# Patient Record
Sex: Female | Born: 1940
Health system: Southern US, Community
[De-identification: ages and names within clinical notes are randomized; demographics above are authoritative.]

## PROBLEM LIST (undated history)

## (undated) DIAGNOSIS — K635 Polyp of colon: Secondary | ICD-10-CM

## (undated) DIAGNOSIS — R112 Nausea with vomiting, unspecified: Secondary | ICD-10-CM

## (undated) DIAGNOSIS — I10 Essential (primary) hypertension: Secondary | ICD-10-CM

## (undated) DIAGNOSIS — M1990 Unspecified osteoarthritis, unspecified site: Secondary | ICD-10-CM

## (undated) DIAGNOSIS — B351 Tinea unguium: Principal | ICD-10-CM

## (undated) DIAGNOSIS — N870 Mild cervical dysplasia: Secondary | ICD-10-CM

## (undated) DIAGNOSIS — I6529 Occlusion and stenosis of unspecified carotid artery: Secondary | ICD-10-CM

## (undated) DIAGNOSIS — E785 Hyperlipidemia, unspecified: Secondary | ICD-10-CM

## (undated) DIAGNOSIS — H9190 Unspecified hearing loss, unspecified ear: Secondary | ICD-10-CM

## (undated) DIAGNOSIS — J45909 Unspecified asthma, uncomplicated: Secondary | ICD-10-CM

## (undated) DIAGNOSIS — I350 Nonrheumatic aortic (valve) stenosis: Secondary | ICD-10-CM

## (undated) DIAGNOSIS — Z9889 Other specified postprocedural states: Secondary | ICD-10-CM

## (undated) DIAGNOSIS — B977 Papillomavirus as the cause of diseases classified elsewhere: Secondary | ICD-10-CM

## (undated) HISTORY — DX: Essential (primary) hypertension: I10

## (undated) HISTORY — DX: Unspecified asthma, uncomplicated: J45.909

## (undated) HISTORY — DX: Mild cervical dysplasia: N87.0

## (undated) HISTORY — DX: Occlusion and stenosis of unspecified carotid artery: I65.29

## (undated) HISTORY — DX: Papillomavirus as the cause of diseases classified elsewhere: B97.7

## (undated) HISTORY — PX: COLONOSCOPY: SHX174

## (undated) HISTORY — DX: Nonrheumatic aortic (valve) stenosis: I35.0

## (undated) HISTORY — PX: MULTIPLE TOOTH EXTRACTIONS: SHX2053

## (undated) HISTORY — PX: POLYPECTOMY: SHX149

## (undated) HISTORY — DX: Tinea unguium: B35.1

## (undated) HISTORY — DX: Unspecified hearing loss, unspecified ear: H91.90

## (undated) HISTORY — DX: Hyperlipidemia, unspecified: E78.5

## (undated) HISTORY — DX: Polyp of colon: K63.5

---

## 1989-08-21 HISTORY — PX: BREAST BIOPSY: SHX20

## 1998-07-27 ENCOUNTER — Encounter: Payer: Self-pay | Admitting: Internal Medicine

## 1998-07-27 ENCOUNTER — Ambulatory Visit (HOSPITAL_COMMUNITY): Admission: RE | Admit: 1998-07-27 | Discharge: 1998-07-27 | Payer: Self-pay | Admitting: Internal Medicine

## 1998-08-21 HISTORY — PX: DILATION AND CURETTAGE OF UTERUS: SHX78

## 1998-08-25 ENCOUNTER — Other Ambulatory Visit: Admission: RE | Admit: 1998-08-25 | Discharge: 1998-08-25 | Payer: Self-pay | Admitting: Internal Medicine

## 1998-09-01 ENCOUNTER — Encounter: Payer: Self-pay | Admitting: Internal Medicine

## 1998-09-01 ENCOUNTER — Ambulatory Visit (HOSPITAL_COMMUNITY): Admission: RE | Admit: 1998-09-01 | Discharge: 1998-09-01 | Payer: Self-pay | Admitting: Internal Medicine

## 1998-12-24 ENCOUNTER — Other Ambulatory Visit: Admission: RE | Admit: 1998-12-24 | Discharge: 1998-12-24 | Payer: Self-pay | Admitting: Obstetrics and Gynecology

## 1998-12-26 ENCOUNTER — Other Ambulatory Visit: Admission: RE | Admit: 1998-12-26 | Discharge: 1998-12-26 | Payer: Self-pay | Admitting: Obstetrics and Gynecology

## 1999-01-10 ENCOUNTER — Ambulatory Visit (HOSPITAL_COMMUNITY): Admission: RE | Admit: 1999-01-10 | Discharge: 1999-01-10 | Payer: Self-pay | Admitting: Obstetrics and Gynecology

## 1999-07-09 ENCOUNTER — Emergency Department (HOSPITAL_COMMUNITY): Admission: EM | Admit: 1999-07-09 | Discharge: 1999-07-09 | Payer: Self-pay | Admitting: Emergency Medicine

## 1999-07-10 ENCOUNTER — Encounter: Payer: Self-pay | Admitting: Emergency Medicine

## 1999-08-17 ENCOUNTER — Ambulatory Visit (HOSPITAL_COMMUNITY): Admission: RE | Admit: 1999-08-17 | Discharge: 1999-08-17 | Payer: Self-pay | Admitting: Internal Medicine

## 1999-08-17 ENCOUNTER — Encounter: Payer: Self-pay | Admitting: Internal Medicine

## 2000-05-26 ENCOUNTER — Emergency Department (HOSPITAL_COMMUNITY): Admission: EM | Admit: 2000-05-26 | Discharge: 2000-05-27 | Payer: Self-pay | Admitting: Emergency Medicine

## 2000-08-28 ENCOUNTER — Encounter: Payer: Self-pay | Admitting: Internal Medicine

## 2000-08-28 ENCOUNTER — Encounter: Admission: RE | Admit: 2000-08-28 | Discharge: 2000-08-28 | Payer: Self-pay | Admitting: Internal Medicine

## 2001-08-30 ENCOUNTER — Encounter: Payer: Self-pay | Admitting: Internal Medicine

## 2001-08-30 ENCOUNTER — Ambulatory Visit (HOSPITAL_COMMUNITY): Admission: RE | Admit: 2001-08-30 | Discharge: 2001-08-30 | Payer: Self-pay | Admitting: Internal Medicine

## 2001-09-27 ENCOUNTER — Other Ambulatory Visit: Admission: RE | Admit: 2001-09-27 | Discharge: 2001-09-27 | Payer: Self-pay | Admitting: Obstetrics and Gynecology

## 2002-09-24 ENCOUNTER — Ambulatory Visit (HOSPITAL_COMMUNITY): Admission: RE | Admit: 2002-09-24 | Discharge: 2002-09-24 | Payer: Self-pay | Admitting: Internal Medicine

## 2002-09-24 ENCOUNTER — Encounter: Payer: Self-pay | Admitting: Internal Medicine

## 2003-09-28 ENCOUNTER — Ambulatory Visit (HOSPITAL_COMMUNITY): Admission: RE | Admit: 2003-09-28 | Discharge: 2003-09-28 | Payer: Self-pay | Admitting: Internal Medicine

## 2004-09-30 ENCOUNTER — Ambulatory Visit (HOSPITAL_COMMUNITY): Admission: RE | Admit: 2004-09-30 | Discharge: 2004-09-30 | Payer: Self-pay | Admitting: Internal Medicine

## 2005-05-17 ENCOUNTER — Other Ambulatory Visit: Admission: RE | Admit: 2005-05-17 | Discharge: 2005-05-17 | Payer: Self-pay | Admitting: Internal Medicine

## 2005-09-28 ENCOUNTER — Ambulatory Visit (HOSPITAL_COMMUNITY): Admission: RE | Admit: 2005-09-28 | Discharge: 2005-09-28 | Payer: Self-pay | Admitting: Internal Medicine

## 2006-10-02 ENCOUNTER — Ambulatory Visit (HOSPITAL_COMMUNITY): Admission: RE | Admit: 2006-10-02 | Discharge: 2006-10-02 | Payer: Self-pay | Admitting: Internal Medicine

## 2006-10-30 ENCOUNTER — Other Ambulatory Visit: Admission: RE | Admit: 2006-10-30 | Discharge: 2006-10-30 | Payer: Self-pay | Admitting: Internal Medicine

## 2007-10-07 ENCOUNTER — Ambulatory Visit (HOSPITAL_COMMUNITY): Admission: RE | Admit: 2007-10-07 | Discharge: 2007-10-07 | Payer: Self-pay | Admitting: Internal Medicine

## 2008-02-16 ENCOUNTER — Emergency Department (HOSPITAL_COMMUNITY): Admission: EM | Admit: 2008-02-16 | Discharge: 2008-02-16 | Payer: Self-pay | Admitting: Emergency Medicine

## 2008-04-10 ENCOUNTER — Ambulatory Visit (HOSPITAL_COMMUNITY): Admission: RE | Admit: 2008-04-10 | Discharge: 2008-04-10 | Payer: Self-pay | Admitting: Obstetrics and Gynecology

## 2008-04-10 ENCOUNTER — Encounter (INDEPENDENT_AMBULATORY_CARE_PROVIDER_SITE_OTHER): Payer: Self-pay | Admitting: Obstetrics and Gynecology

## 2008-08-21 HISTORY — PX: KNEE SURGERY: SHX244

## 2008-08-21 HISTORY — PX: BREAST BIOPSY: SHX20

## 2008-08-31 ENCOUNTER — Inpatient Hospital Stay (HOSPITAL_COMMUNITY): Admission: EM | Admit: 2008-08-31 | Discharge: 2008-09-04 | Payer: Self-pay | Admitting: Emergency Medicine

## 2008-12-23 ENCOUNTER — Encounter: Payer: Self-pay | Admitting: Internal Medicine

## 2008-12-23 ENCOUNTER — Ambulatory Visit: Admission: RE | Admit: 2008-12-23 | Discharge: 2008-12-23 | Payer: Self-pay | Admitting: Internal Medicine

## 2008-12-23 ENCOUNTER — Ambulatory Visit: Payer: Self-pay | Admitting: Vascular Surgery

## 2010-09-10 ENCOUNTER — Encounter: Payer: Self-pay | Admitting: Internal Medicine

## 2010-12-05 LAB — BASIC METABOLIC PANEL
BUN: 12 mg/dL (ref 6–23)
BUN: 13 mg/dL (ref 6–23)
BUN: 13 mg/dL (ref 6–23)
CO2: 24 mEq/L (ref 19–32)
Calcium: 8.8 mg/dL (ref 8.4–10.5)
Calcium: 8.9 mg/dL (ref 8.4–10.5)
Chloride: 101 mEq/L (ref 96–112)
Chloride: 102 mEq/L (ref 96–112)
Chloride: 105 mEq/L (ref 96–112)
Creatinine, Ser: 0.97 mg/dL (ref 0.4–1.2)
Creatinine, Ser: 0.98 mg/dL (ref 0.4–1.2)
GFR calc Af Amer: >60 mL/min (ref >60)
GFR calc Af Amer: >60 mL/min (ref >60)
GFR calc Af Amer: >60 mL/min (ref >60)
GFR calc non Af Amer: 44 mL/min — ABNORMAL LOW (ref >60)
GFR calc non Af Amer: 57 mL/min — ABNORMAL LOW (ref >60)
GFR calc non Af Amer: 60 mL/min — ABNORMAL LOW (ref >60)
Glucose, Bld: 244 mg/dL — ABNORMAL HIGH (ref 70–99)
Potassium: 3.8 mEq/L (ref 3.5–5.1)
Potassium: 4.3 mEq/L (ref 3.5–5.1)
Sodium: 134 mEq/L — ABNORMAL LOW (ref 135–145)
Sodium: 135 mEq/L (ref 135–145)
Sodium: 140 mEq/L (ref 135–145)

## 2010-12-05 LAB — GLUCOSE, CAPILLARY
Glucose-Capillary: 103 mg/dL — ABNORMAL HIGH (ref 70–99)
Glucose-Capillary: 115 mg/dL — ABNORMAL HIGH (ref 70–99)
Glucose-Capillary: 119 mg/dL — ABNORMAL HIGH (ref 70–99)
Glucose-Capillary: 129 mg/dL — ABNORMAL HIGH (ref 70–99)
Glucose-Capillary: 139 mg/dL — ABNORMAL HIGH (ref 70–99)
Glucose-Capillary: 142 mg/dL — ABNORMAL HIGH (ref 70–99)
Glucose-Capillary: 150 mg/dL — ABNORMAL HIGH (ref 70–99)
Glucose-Capillary: 152 mg/dL — ABNORMAL HIGH (ref 70–99)
Glucose-Capillary: 176 mg/dL — ABNORMAL HIGH (ref 70–99)
Glucose-Capillary: 178 mg/dL — ABNORMAL HIGH (ref 70–99)
Glucose-Capillary: 183 mg/dL — ABNORMAL HIGH (ref 70–99)
Glucose-Capillary: 225 mg/dL — ABNORMAL HIGH (ref 70–99)
Glucose-Capillary: 232 mg/dL — ABNORMAL HIGH (ref 70–99)
Glucose-Capillary: 240 mg/dL — ABNORMAL HIGH (ref 70–99)
Glucose-Capillary: 241 mg/dL — ABNORMAL HIGH (ref 70–99)
Glucose-Capillary: 246 mg/dL — ABNORMAL HIGH (ref 70–99)
Glucose-Capillary: 60 mg/dL — ABNORMAL LOW (ref 70–99)
Glucose-Capillary: 86 mg/dL (ref 70–99)
Glucose-Capillary: 93 mg/dL (ref 70–99)

## 2010-12-05 LAB — URINALYSIS, ROUTINE W REFLEX MICROSCOPIC
Bilirubin Urine: NEGATIVE
Glucose, UA: NEGATIVE mg/dL
Hgb urine dipstick: NEGATIVE
Ketones, ur: NEGATIVE mg/dL
Nitrite: NEGATIVE
Protein, ur: NEGATIVE mg/dL
Specific Gravity, Urine: 1.009 (ref 1.005–1.030)
Urobilinogen, UA: 0.2 mg/dL (ref 0.0–1.0)
pH: 7 (ref 5.0–8.0)

## 2010-12-05 LAB — BASIC METABOLIC PANEL WITH GFR
CO2: 27 meq/L (ref 19–32)
Calcium: 9.1 mg/dL (ref 8.4–10.5)
Creatinine, Ser: 0.93 mg/dL (ref 0.4–1.2)
Glucose, Bld: 90 mg/dL (ref 70–99)

## 2010-12-05 LAB — POCT I-STAT 4, (NA,K, GLUC, HGB,HCT)
Glucose, Bld: 187 mg/dL — ABNORMAL HIGH (ref 70–99)
HCT: 35 % — ABNORMAL LOW (ref 36.0–46.0)
Hemoglobin: 11.9 g/dL — ABNORMAL LOW (ref 12.0–15.0)
Potassium: 4.7 mEq/L (ref 3.5–5.1)
Sodium: 134 meq/L — ABNORMAL LOW (ref 135–145)

## 2010-12-05 LAB — DIFFERENTIAL
Basophils Absolute: 0 K/uL (ref 0.0–0.1)
Basophils Relative: 0 % (ref 0–1)
Eosinophils Absolute: 0.1 10*3/uL (ref 0.0–0.7)
Eosinophils Relative: 1 % (ref 0–5)
Lymphocytes Relative: 14 % (ref 12–46)
Lymphs Abs: 1.2 10*3/uL (ref 0.7–4.0)
Monocytes Absolute: 0.4 10*3/uL (ref 0.1–1.0)
Monocytes Relative: 5 % (ref 3–12)
Neutro Abs: 7 K/uL (ref 1.7–7.7)
Neutrophils Relative %: 80 % — ABNORMAL HIGH (ref 43–77)

## 2010-12-05 LAB — CBC
HCT: 32.1 % — ABNORMAL LOW (ref 36.0–46.0)
HCT: 34.6 % — ABNORMAL LOW (ref 36.0–46.0)
Hemoglobin: 10.6 g/dL — ABNORMAL LOW (ref 12.0–15.0)
Hemoglobin: 10.7 g/dL — ABNORMAL LOW (ref 12.0–15.0)
Hemoglobin: 11.6 g/dL — ABNORMAL LOW (ref 12.0–15.0)
MCHC: 32.8 g/dL (ref 30.0–36.0)
MCHC: 33.6 g/dL (ref 30.0–36.0)
MCV: 84.4 fL (ref 78.0–100.0)
MCV: 86.2 fL (ref 78.0–100.0)
MCV: 86.2 fL (ref 78.0–100.0)
Platelets: 303 10*3/uL (ref 150–400)
Platelets: 333 10*3/uL (ref 150–400)
Platelets: 363 K/uL (ref 150–400)
RBC: 3.72 MIL/uL — ABNORMAL LOW (ref 3.87–5.11)
RBC: 3.83 MIL/uL — ABNORMAL LOW (ref 3.87–5.11)
RBC: 3.95 MIL/uL (ref 3.87–5.11)
RBC: 4.1 MIL/uL (ref 3.87–5.11)
RDW: 14 % (ref 11.5–15.5)
RDW: 14.2 % (ref 11.5–15.5)
WBC: 11.2 10*3/uL — ABNORMAL HIGH (ref 4.0–10.5)
WBC: 12.2 10*3/uL — ABNORMAL HIGH (ref 4.0–10.5)
WBC: 8.7 10*3/uL (ref 4.0–10.5)

## 2010-12-05 LAB — URINE MICROSCOPIC-ADD ON

## 2010-12-05 LAB — PROTIME-INR
INR: 1.1 (ref 0.00–1.49)
Prothrombin Time: 14.3 seconds (ref 11.6–15.2)

## 2011-01-03 NOTE — H&P (Signed)
Gail Lynch, HOPKINS               ACCOUNT NO.:  [id number]   MEDICAL RECORD NO.:  [id number]          PATIENT TYPE:  AMB   LOCATION:  SDC                           FACILITY:  [facility]   PHYSICIAN:  Janine Limbo, M.D.DATE OF BIRTH:  03/17/1941   DATE OF ADMISSION:  DATE OF DISCHARGE:                              HISTORY & PHYSICAL   DATE OF PROCEDURE:  April 10, 2008.   HISTORY OF PRESENT ILLNESS.:  Ms. Ivy is a 70 year old female, para 3-  0-0-3 who presents for a cold knife conization of the cervix.  She has  been followed at the [facility]  of [facility].  The patient had a Pap smear that  showed atypical squamous cells of undetermined significance.  Her high-  risk human papilloma virus test was positive.  Colposcopy and biopsies  were performed.  Biopsy showed cervical intraepithelial neoplasia I on  the exocervix but also on the endocervix.  The transition zone could not  be fully seen.  The patient had a dilatation and curettage performed in  2000 because of postmenopausal bleeding.  The pathology report was  negative.  Otherwise the patient has done well.   OBSTETRICAL HISTORY:  The patient has had 3 vaginal deliveries at term.   DRUG ALLERGIES:  THE PATIENT SAYS THAT SHE IS ALLERGIC TO ASPIRIN BUT  THEN SAYS SHE IS NOT REALLY SURE BUT ONCE TOOK ASPIRIN FOR A DENTAL  PROCEDURE AND JUST THOUGHT SHE SHOULD NOT TAKE ASPIRIN AGAIN.   PAST MEDICAL HISTORY:  The patient has hypertension and diabetes.   CURRENT MEDICATIONS:  Include NovoLog and she takes 40 units in the  morning and 40 units in the evening.  She takes Diovan hydrochloride.  She takes amlodipine.  She takes Janumet.  She was told to take a baby  aspirin each day but she does not take that medication.   SOCIAL HISTORY:  The patient denies cigarette use, alcohol use, and  recreational drug use.   FAMILY HISTORY:  The patient has a family history  of heart disease,  asthma, hypertension, and diabetes.   REVIEW OF SYSTEMS:  Noncontributory.   PHYSICAL EXAM:  Weight is 224 pounds.  Height is 5 feet 1 inch.  HEENT:  Within normal limits.  CHEST:  Clear.  HEART:  Regular rate and rhythm.  Her breasts are without masses.  Her abdomen is soft and nontender.  EXTREMITIES:  Grossly normal.  Her neurologic exam is grossly normal.  PELVIC EXAM:  External genitalia is normal.  The vagina is normal except  for atrophic changes.  The cervix is atrophic and flush with the vagina.  The uterus is small and no masses are appreciated.  Adnexa:  No masses  are appreciated.   ASSESSMENT:  1. Cervical intraepithelial neoplasia with a positive endocervical      curettage.  2. Transition zone could not be fully seen on colposcopy.  3. High-risk human papilloma virus.   PLAN:  The patient will undergo a cold knife conization of the cervix.  She understands the indications  for her procedure and she accepts the  risks of, but not limited to, anesthetic complications, bleeding,  infection, and possible damage to the surrounding organs.      Janine Limbo, M.D.  Electronically Signed     AVS/MEDQ  D:  04/09/2008  T:  04/09/2008  Job:  [id number]   cc:   Margaretmary Bayley, M.D.  Fax: [phone]

## 2011-01-03 NOTE — H&P (Signed)
NAMEBRUCHA, AHLQUIST               ACCOUNT NO.:  [id number]   MEDICAL RECORD NO.:  [id number]          PATIENT TYPE:  INP   LOCATION:  5018                         FACILITY:  [facility]   PHYSICIAN:  Gail Post, MD    DATE OF BIRTH:  12-Jul-1941   DATE OF ADMISSION:  08/31/2008  DATE OF DISCHARGE:                              HISTORY & PHYSICAL   CHIEF COMPLAINT:  Right knee pain.   HISTORY:  [name] is a 70 year old African American woman who  fell while walking onto her right knee today.  She complains of acute  onset left wrist and right knee pain.  She has had chronic left wrist  and hand pain, but she says that this is worse now than before.  The  right knee has excruciating pain and she is unable to walk.  She is  unable to lift her leg.  It is located directly over her kneecap.  She  does not have any other radicular symptoms.  She rates it as a 9/10 and  describes it as being sharp.   PAST MEDICAL HISTORY:  Significant for hypertension, diabetes, and  cervical abnormality.   FAMILY HISTORY:  Positive for diabetes and coronary artery disease in  her brother and her mother.  It is also positive for asthma.   SOCIAL HISTORY:  She quit smoking 20 years ago.   REVIEW OF SYSTEMS:  Complete review of systems was performed and was  otherwise negative with the exception of the musculoskeletal complaint  as above.   PHYSICAL EXAMINATION:  GENERAL:  She is alert and oriented, in no acute  distress, well developed, well nourished.  EYES:  Her extraocular movements are intact.  LYMPHATIC:  She has no cervical or axillary lymphadenopathy.  CARDIOVASCULAR:  She has a regular rate and rhythm with no pedal edema.  RESPIRATORY:  She has no cyanosis, no clubbing, and no increase in  respiratory efforts.  PSYCHIATRIC:  Her judgment and insight are intact.  SKIN:  She has no lesions over her knee.  She does have soft tissue  swelling.  NEUROLOGIC:  Her sensation is intact  throughout both lower extremities.  EHL and FHL are firing.  MUSCULOSKELETAL:  She has mild tenderness over the distal radius with no  gross deformity.  Her right knee has tenderness directly over the  patella.  Her motor is intact distally.  She cannot do a straight leg  raise due to pain.  She denies pain either over her hips.  Her left side  is atraumatic.   LABORATORY VALUES:  A chemistry 8 which is within normal limits and a  hemoglobin of 11.  She has an x-ray of her right knee that demonstrates  evidence for a comminuted patella fracture.   IMPRESSION:  1. Right comminuted patella fracture.  2. Diabetes mellitus.  3. Hypertension.   PLAN:  We will plan to admit her and prepare her for surgery.  She will  get an EKG and a chest x-ray and preoperative medical consultation in  order to optimize her risks.  We will plan to  proceed with surgery  hopefully tomorrow afternoon assuming that all is well.  The risks,  benefits, and alternatives were discussed with her and her family at  length and they would like to proceed.  We will obtain chest x-ray and  EKG and also an x-ray of her left wrist to rule out any additional  trauma.      Gail Post, MD  Electronically Signed     JPL/MEDQ  D:  08/31/2008  T:  09/01/2008  Job:  [id number]

## 2011-01-03 NOTE — Op Note (Signed)
Gail Lynch, Gail Lynch               ACCOUNT NO.:  [id number]   MEDICAL RECORD NO.:  [id number]          PATIENT TYPE:  AMB   LOCATION:  SDC                           [facility]   PHYSICIAN:  Janine Limbo, M.D.DATE OF BIRTH:  [birth year]   DATE OF PROCEDURE:  04/10/2008  DATE OF DISCHARGE:                               OPERATIVE REPORT   PREOPERATIVE DIAGNOSES:  1. Cervical intraepithelial neoplasia I.  2. Positive endocervical curettage.  3. Transition zone not completely seen.  4. Human papillomavirus.   POSTOPERATIVE DIAGNOSES:  1. Cervical intraepithelial neoplasia I.  2. Positive endocervical curettage.  3. Transition zone not completely seen.  4. Human papillomavirus.   PROCEDURE:  Cold knife conization of the cervix.   SURGEON:  Leonard Schwartz, MD   FIRST ASSISTANT:  None.   ANESTHETIC:  Monitored anesthetic control and local 0.5% Marcaine with  epinephrine and lidocaine.   DISPOSITION:  Gail Lynch is a 70 year old female, para 3-0-0-3, who  presents with the above-mentioned diagnosis.  She understands the  indications for her surgical procedure and she accepts the risks, but  not limited to, anesthetic complications, bleeding, infection, and  possible damage to surrounding organs.   FINDINGS:  The patient was noted to have a small uterus as best we could  tell by examination under anesthesia.  Upon staining of the vagina and  the cervix, there were no areas of nonstaining with Lugol solution.  The  cervix was flushed with the apex of the vagina and this made the  conization difficult.  There was no parametrial thickening and no signs  of cervical cancer.   PROCEDURE:  The patient was taken to the operating room where she was  given medication through her IV line.  The patient was placed in  lithotomy position.  The lower abdomen, perineum, and vagina were  prepped with multiple layers of Betadine.  The bladder was drained of  urine.  The patient  was sterilely draped.  2 mL of 0.5% Marcaine with  epinephrine and 1% Xylocaine were injected into the anterior lip of the  cervix.  A single-tooth tenaculum was placed on the cervix.  A  paracervical block was placed using 10 mL of our anesthetic solution.  The endocervix was sounded.  An additional 10 mL of our anesthetic  solution were injected directly into the cervix.  Next, a  circumferential incision was made around the endocervix and the incision  was angled in a cone like fashion toward the endocervix.  The specimen  was removed.  We noted an opening at the 2 o'clock position.  An  additional 5 mL of our anesthetic solution were injected directly into  the anterior lip of the cervix because of discomfort from the patient.  Inverting sutures were placed in the cervix at the 12 o'clock, 3  o'clock, 6 o'clock, and 9 o'clock positions.  Figure-of-eight sutures  were used for hemostasis.  Hemostasis was adequate at the end of our  procedure.  We did test the bladder to document the integrity of the  bladder by filling the bladder  up and there was no leakage into the  vagina.  The patient tolerated her procedure well.  The estimated blood  loss was 20 mL or less.  Sponge, needle, and instrument counts were  correct on 2 occasions.  Wound classification was 2.  The patient was  returned to the supine position after repeat examination.  She was then  taken to the recovery room in stable condition.  The conization specimen  was sent to pathology for evaluation.   FOLLOWUP INSTRUCTIONS:  The patient will take Tylenol 1-2 tablets every  6 hours as needed for mild-to-moderate pain.  She will take Vicodin 1-2  tablets every 4 hours as needed for severe pain.  She will take no more  than 6 tablets per day.  The patient will return to see Dr. Stefano Gaul in  2 weeks for followup examination.  She will call for questions or  concerns.      Janine Limbo, M.D.  Electronically  Signed     AVS/MEDQ  D:  04/10/2008  T:  04/11/2008  Job:  [id number]   cc:   Margaretmary Bayley, M.D.  Fax: [phone]

## 2011-01-03 NOTE — Op Note (Signed)
[name], Lynch               ACCOUNT NO.:  [id number]   MEDICAL RECORD NO.:  [id number]           PATIENT TYPE:   LOCATION:                                 FACILITY:   PHYSICIAN:  Eulas Post, MD    DATE OF BIRTH:  1941/03/22   DATE OF PROCEDURE:  09/01/2008  DATE OF DISCHARGE:                               OPERATIVE REPORT   PREOPERATIVE DIAGNOSES:  Right comminuted patellar fracture.   POSTOPERATIVE DIAGNOSES:  Right comminuted patellar fracture.   OPERATION/PROCEDURE:  Right patellar open reduction and internal  fixation with partial patellectomy.   ANESTHESIA:  General.   TOURNIQUET TIME:  90 minutes.   PREOPERATIVE INDICATIONS:  [name] is a 70 year old woman who  fell on to her right knee and broke her right patella.  She was admitted  and stabilized and optimized by the medical service.  She subsequently  went to the operating room for open reduction and internal fixation.  The risks, benefits, and alternatives were discussed with the patient  and the family preoperatively including not limited to the risks of  infection, bleeding, nerve injury, stiffness, posttraumatic arthritis,  loss of function, ongoing chronic pain, numbness in the anterolateral  knee, hardware permanent, hardware failure, the need for hardware  removal, cardiopulmonary complications, blood clots, and among others  and they were willing to proceed.   OPERATIVE PROCEDURE:  The patient was brought to the operating room,  placed in the supine position.  Antibiotics were given.  The right lower  extremity was prepped and draped in usual sterile fashion after general  anesthesia was administered.  The tourniquet was utilized.  A decision  was made over the anterior knee.  The patella was exposed.  There were  approximately 4 different pieces of the patella.  The one small  inferomedial piece was excised, because this was too small for fixation.  The distal pole was in basically 2  pieces and these were held each  individually with screws.  We also placed a  FiberWire #2, cerclage  through the cannulated screws.  At this time, we placed a total of 2  cerclage FiberWires in this fashion.  C-arm was utilized to confirm the  anatomical reduction.  The patella had fairly weak bone and had  significant comminution as well.  We were able to reduce it anatomically  under direct visualization.  The articular surface had less than a 1 mm  of step-off with the anterior surface anatomically reduced.  We placed  cannulated screws across the fracture site.  A 4.5 mm cannulated screws  were utilized.  We had excellent compression with the clamp and I was  fearful that overdrilling the bone would weaken the bone because of the  degree of comminution and bone quality, therefore, I used fully threaded  screws in order to get the best purchase.  Decompression was achieved  with the clamp.  I did open the distal pole of the patella with the  drill, in order to assist with placement of the screws.   After the screws were in place, and  the FiberWire passed.  We tied the  FiberWire down in a cerclage fashion and irrigated the knee copiously.  The retinaculum was repaired with #1 Vicryl.  The subcutaneous tissue  was closed  with 3-0 Vicryl followed by 4-0 Monocryl and Steri-Strips into the skin.  The wounds were injected with Marcaine and the patient was dressed with  sterile gauze and a knee mobilizer and returned to the PACU in a stable  and satisfactory condition.  There were no complications.  The patient  tolerated the procedure well.      Eulas Post, MD  Electronically Signed     JPL/MEDQ  D:  09/01/2008  T:  09/02/2008  Job:  [id number]

## 2011-01-03 NOTE — Discharge Summary (Signed)
[name], [name]               ACCOUNT NO.:  [id number]   MEDICAL RECORD NO.:  [id number]          PATIENT TYPE:  INP   LOCATION:  5018                         FACILITY:  [facility]   PHYSICIAN:  Eulas Post, MD    DATE OF BIRTH:  August 12, 1941   DATE OF ADMISSION:  08/31/2008  DATE OF DISCHARGE:  09/04/2008                               DISCHARGE SUMMARY   ADMISSION DIAGNOSIS:  Right patella fracture.   DISCHARGE DIAGNOSES:  Include:  1. Right patella fracture.  2. Hypertension.  3. Diabetes.   DISCHARGE INSTRUCTIONS:  She can be weightbearing as tolerated in full  extension using a knee immobilizer at all times.  Her dressings can be  changed every other day as needed.  Discharge medications include  Coumadin as well as Percocet.  Coumadin will be for 1 month.  She can  resume her home medications as well.   HOSPITAL COURSE:  [name] is a 70 year old woman who had a right  patella fracture.  She underwent ORIF.  She tolerated the procedure well  and postoperatively did not have any complications.  Her hemoglobin and  hematocrit remained stable throughout her hospital stay.  She did well  with physical therapy and was making slow progress.  She is planned to  be discharged to skilled nursing facility for ongoing physical therapy.  The patient benefited maximally from her hospital stay, and there were  no complications.      Eulas Post, MD  Electronically Signed     JPL/MEDQ  D:  09/04/2008  T:  09/04/2008  Job:  [id number]

## 2011-05-18 LAB — DIFFERENTIAL
Lymphs Abs: 1.1
Monocytes Absolute: 0.2
Monocytes Relative: 2 — ABNORMAL LOW
Neutro Abs: 7.8 — ABNORMAL HIGH
Neutrophils Relative %: 86 — ABNORMAL HIGH

## 2011-05-18 LAB — POCT CARDIAC MARKERS
CKMB, poc: 4.6
Myoglobin, poc: 190
Myoglobin, poc: 271
Operator id: 261601
Operator id: 261601
Troponin i, poc: <0.05
Troponin i, poc: <0.05

## 2011-05-18 LAB — COMPREHENSIVE METABOLIC PANEL
ALT: 17
Albumin: 3.4 — ABNORMAL LOW
BUN: 17
Calcium: 9.6
Glucose, Bld: 264 — ABNORMAL HIGH
Potassium: 4
Sodium: 137
Total Protein: 7.3

## 2011-05-18 LAB — CBC
Hemoglobin: 11.9 — ABNORMAL LOW
MCHC: 33.3
Platelets: 333
RDW: 14.4

## 2011-08-16 ENCOUNTER — Encounter: Payer: Self-pay | Admitting: Gastroenterology

## 2011-09-05 ENCOUNTER — Encounter: Payer: Self-pay | Admitting: Gastroenterology

## 2011-09-05 ENCOUNTER — Ambulatory Visit (INDEPENDENT_AMBULATORY_CARE_PROVIDER_SITE_OTHER): Payer: Medicare Other | Admitting: Gastroenterology

## 2011-09-05 DIAGNOSIS — E119 Type 2 diabetes mellitus without complications: Secondary | ICD-10-CM

## 2011-09-05 DIAGNOSIS — K5900 Constipation, unspecified: Secondary | ICD-10-CM | POA: Insufficient documentation

## 2011-09-05 MED ORDER — PEG-KCL-NACL-NASULF-NA ASC-C 100 G PO SOLR: 1.0000 | ORAL | Status: DC

## 2011-09-05 NOTE — Progress Notes (Signed)
HPI: This is a   very pleasant 71 year old woman whom I am meeting for the first time today.  She has irregular bowels for 2-3 months.  Before that she was regular: would have a BM easily 1-2 times a day.  But now she has a BM about 4 times a week.  NO blood in her stool.  The consistency of the stools have not changed.  Does not have blood in her stool  She had some brief band like abd pains, but now  She tried drinking more prune juice, more veggies in her diet.  She tried olive oil every day.  This did not help.  She has been taking flax seed for 2-3 months.  One a day.  This did not help.  She tried [place] a couple weeks ago, took pills one pill once daily for about 2 weeks.  She did not notice any changes in her bowels.  She tried [place] pills, once a day for three.  She thinks this caused wheezing ans SOB.  Overall her weight is stable.    No narcotics.   Stopped allopurinal   Never had a colonoscopy.  She did have a sigmoidoscopy (many years ago), she does not think anything was found.  She thinks that it was done by a gynecologist.  Review of systems: Pertinent positive and negative review of systems were noted in the above HPI section. Complete review of systems was performed and was otherwise normal.    Past Medical History  Diagnosis Date  . Diabetes mellitus   . Hypertension     Past Surgical History  Procedure Date  . Breast biopsy     Current Outpatient Prescriptions  Medication Sig Dispense Refill  . Cholecalciferol (VITAMIN D3) 1000 UNITS CAPS Take 1 capsule by mouth daily.      . fish oil-omega-3 fatty acids 1000 MG capsule Take 2 g by mouth daily.      . Flaxseed, Linseed, (FLAX SEED OIL) 1000 MG CAPS Take 3 capsules by mouth daily.      . Garlic 1000 MG CAPS Take 1 capsule by mouth daily.      . insulin aspart (NOVOLOG) 100 UNIT/ML injection Inject into the skin 3 (three) times daily before meals. Take 40 units in the morning and 35 at night       . pyridoxine (CVS VITAMIN B-6) 200 MG tablet Take 200 mg by mouth daily.      . valsartan-hydrochlorothiazide (DIOVAN-HCT) 160-12.5 MG per tablet Take 1 tablet by mouth daily.      . vitamin B-12 (CYANOCOBALAMIN) 500 MCG tablet Take 500 mcg by mouth daily. Take 1 to 3 tablets daily        Allergies as of 09/05/2011 - never reviewed  Allergen Reaction Noted  . Penicillins  09/05/2011    Family History  Problem Relation Age of Onset  . Diabetes Brother   . Heart disease Brother   . Heart disease Mother   . Heart disease Father     History   Social History  . Marital Status: Widowed    Spouse Name: N/A    Number of Children: 3  . Years of Education: N/A   Occupational History  . retired    Social History Main Topics  . Smoking status: Former Smoker    Quit date: 08/21/1990  . Smokeless tobacco: Former Neurosurgeon  . Alcohol Use: No  . Drug Use: No  . Sexually Active: Not on file  Other Topics Concern  . Not on file   Social History Narrative  . No narrative on file       Physical Exam: BP 130/70  Pulse 70  Ht 5\' 4"  (1.626 m)  Wt 220 lb 6.4 oz (99.973 kg)  BMI 37.83 kg/m2 Constitutional: generally well-appearing Psychiatric: alert and oriented x3 Eyes: extraocular movements intact Mouth: oral pharynx moist, no lesions Neck: supple no lymphadenopathy Cardiovascular: heart regular rate and rhythm Lungs: clear to auscultation bilaterally Abdomen: soft, nontender, nondistended, no obvious ascites, no peritoneal signs, normal bowel sounds Extremities: no lower extremity edema bilaterally Skin: no lesions on visible extremities    Assessment and plan: 71 y.o. female with  recent constipation, change in her bowels  She has never had a colonoscopy for colon cancer screening and we will arrange for that to be done at her soonest convenience to evaluate her constipation. In the meantime she will start once daily MiraLax.

## 2011-09-05 NOTE — Patient Instructions (Addendum)
You will be set up for a colonoscopy for constipation. Miralax, one dose every day, only stop if you start having terrible diarrhea.

## 2011-09-18 ENCOUNTER — Telehealth: Payer: Self-pay | Admitting: Gastroenterology

## 2011-09-18 DIAGNOSIS — G9389 Other specified disorders of brain: Secondary | ICD-10-CM

## 2011-09-18 NOTE — Telephone Encounter (Signed)
Pt is calling to give [place] some information about her medical condition.  Had knee surgery in 2010.  MRI in 2005 was told she has a mass on her brain.  Wants this added to her medical record. I  have added this info for her.

## 2011-10-02 ENCOUNTER — Telehealth: Payer: Self-pay | Admitting: Gastroenterology

## 2011-10-02 MED ORDER — PEG-KCL-NACL-NASULF-NA ASC-C 100 G PO SOLR: 1.0000 | ORAL | Status: DC

## 2011-10-02 NOTE — Telephone Encounter (Signed)
Pt prep was sent to the wrong pharmacy and was resent today to the correct [facility].  Pt is aware

## 2011-10-03 ENCOUNTER — Encounter: Payer: Self-pay | Admitting: Gastroenterology

## 2011-10-03 ENCOUNTER — Ambulatory Visit (AMBULATORY_SURGERY_CENTER): Payer: Medicare Other | Admitting: Gastroenterology

## 2011-10-03 DIAGNOSIS — D126 Benign neoplasm of colon, unspecified: Secondary | ICD-10-CM

## 2011-10-03 DIAGNOSIS — K5900 Constipation, unspecified: Secondary | ICD-10-CM

## 2011-10-03 DIAGNOSIS — K648 Other hemorrhoids: Secondary | ICD-10-CM

## 2011-10-03 MED ORDER — SODIUM CHLORIDE 0.9 % IV SOLN: 500.0000 mL | INTRAVENOUS | Status: DC

## 2011-10-03 NOTE — Patient Instructions (Signed)
Resume medications. Information given on polyps,hemorrhoids and high fiber diet. D/C Instructions reviewed with family.

## 2011-10-03 NOTE — Op Note (Signed)
[facility] [address] [place], [place]  [address]  COLONOSCOPY PROCEDURE REPORT  PATIENT:  Gail Lynch, Gail Lynch  MR#:  [id number] BIRTHDATE:  [birth year], 70 yrs. old  GENDER:  female ENDOSCOPIST:  Rachael Fee, MD REF. BY:  Margaretmary Bayley, M.D. PROCEDURE DATE:  10/03/2011 PROCEDURE:  Colonoscopy with snare polypectomy ASA CLASS:  Class II INDICATIONS:  constipation MEDICATIONS:   Fentanyl 75 mcg IV, These medications were titrated to patient response per physician's verbal order, Versed 5 mg IV DESCRIPTION OF PROCEDURE:   After the risks benefits and alternatives of the procedure were thoroughly explained, informed consent was obtained.  Digital rectal exam was performed and revealed no rectal masses.   The LB CF-[id number] endoscope was introduced through the anus and advanced to the cecum, which was identified by both the appendix and ileocecal valve, without limitations.  The quality of the prep was good..  The instrument was then slowly withdrawn as the colon was fully examined. <<PROCEDUREIMAGES>> FINDINGS: Three polyps were found, all were removed and all were sent to pathology (jar 1). The largest was 8mm across, heaped up, located in ascending colon, removed with cold snare and sent to pathology. The others were sessile, 3-83mm across, located in cecum and ascending colon, removed with cold snare, sent to pathology (see image3, image7, and image8).  Internal and external Hemorrhoids were found.  This was otherwise a normal examination of the colon (see image10, image4, and image2).   Retroflexed views in the rectum revealed no abnormalities. COMPLICATIONS:  None  ENDOSCOPIC IMPRESSION: 1) Three polyps, all were removed and all were sent to pathology  2) Internal and external hemorrhoids 3) Otherwise normal examination  RECOMMENDATIONS: 1) If the polyps removed today are proven to be adenomatous (pre-cancerous) polyps, you will need a colonoscopy in 3  years. Otherwise you should continue to follow colorectal cancer screening guidelines for "routine risk" patients with a colonoscopy in 10 years. 2) You will receive a letter within 1-2 weeks with the results of your biopsy as well as final recommendations. Please call my office if you have not received a letter after 3 weeks. 3) You should stay on miralax, one dose every day for your chronic constipation. 4) Dr. Christella Hartigan' office will help arrange for you to meet with a new PCP, per your request.  ______________________________ Rachael Fee, MD  n. eSIGNED:   Rachael Fee at 10/03/2011 02:00 PM  Hadassah Pais, [phone]

## 2011-10-03 NOTE — Progress Notes (Signed)
Patient did not experience any of the following events: a burn prior to discharge; a fall within the facility; wrong site/side/patient/procedure/implant event; or a hospital transfer or hospital admission upon discharge from the facility. (G8907) Patient did not have preoperative order for IV antibiotic SSI prophylaxis. (G8918)  

## 2011-10-04 ENCOUNTER — Telehealth: Payer: Self-pay | Admitting: *Deleted

## 2011-10-04 NOTE — Telephone Encounter (Signed)
  Follow up Call-  Call back number 10/03/2011  Post procedure Call Back phone  # [phone]  Permission to leave phone message No  comments PT SAYS SHE DOES NOT HAVE ANS MACHINE     Patient questions:  Do you have a fever, pain , or abdominal swelling? no Pain Score  0 *  Have you tolerated food without any problems? yes  Have you been able to return to your normal activities? yes  Do you have any questions about your discharge instructions: Diet   no Medications  no Follow up visit  no  Do you have questions or concerns about your Care? no  Actions: * If pain score is 4 or above: No action needed, pain <4.

## 2011-10-10 ENCOUNTER — Encounter: Payer: Self-pay | Admitting: Gastroenterology

## 2011-12-17 ENCOUNTER — Encounter: Payer: Self-pay | Admitting: Internal Medicine

## 2011-12-17 DIAGNOSIS — I10 Essential (primary) hypertension: Secondary | ICD-10-CM | POA: Insufficient documentation

## 2011-12-17 DIAGNOSIS — B977 Papillomavirus as the cause of diseases classified elsewhere: Secondary | ICD-10-CM | POA: Insufficient documentation

## 2011-12-17 DIAGNOSIS — H9190 Unspecified hearing loss, unspecified ear: Secondary | ICD-10-CM | POA: Insufficient documentation

## 2011-12-17 DIAGNOSIS — Z0000 Encounter for general adult medical examination without abnormal findings: Secondary | ICD-10-CM | POA: Insufficient documentation

## 2011-12-17 DIAGNOSIS — N870 Mild cervical dysplasia: Secondary | ICD-10-CM | POA: Insufficient documentation

## 2011-12-18 ENCOUNTER — Other Ambulatory Visit (INDEPENDENT_AMBULATORY_CARE_PROVIDER_SITE_OTHER): Payer: Medicare Other

## 2011-12-18 ENCOUNTER — Ambulatory Visit (INDEPENDENT_AMBULATORY_CARE_PROVIDER_SITE_OTHER): Payer: Medicare Other | Admitting: Internal Medicine

## 2011-12-18 ENCOUNTER — Encounter: Payer: Self-pay | Admitting: Internal Medicine

## 2011-12-18 VITALS — BP 138/62 | HR 71 | Temp 98.9°F | Ht 63.0 in | Wt 218.5 lb

## 2011-12-18 DIAGNOSIS — E785 Hyperlipidemia, unspecified: Secondary | ICD-10-CM | POA: Insufficient documentation

## 2011-12-18 DIAGNOSIS — Z23 Encounter for immunization: Secondary | ICD-10-CM

## 2011-12-18 DIAGNOSIS — E119 Type 2 diabetes mellitus without complications: Secondary | ICD-10-CM

## 2011-12-18 DIAGNOSIS — Z0000 Encounter for general adult medical examination without abnormal findings: Secondary | ICD-10-CM

## 2011-12-18 DIAGNOSIS — J45909 Unspecified asthma, uncomplicated: Secondary | ICD-10-CM

## 2011-12-18 DIAGNOSIS — K635 Polyp of colon: Secondary | ICD-10-CM | POA: Insufficient documentation

## 2011-12-18 DIAGNOSIS — H409 Unspecified glaucoma: Secondary | ICD-10-CM | POA: Insufficient documentation

## 2011-12-18 HISTORY — DX: Unspecified asthma, uncomplicated: J45.909

## 2011-12-18 LAB — HEPATIC FUNCTION PANEL
ALT: 22 U/L (ref 0–35)
AST: 18 U/L (ref 0–37)
Alkaline Phosphatase: 84 U/L (ref 39–117)
Bilirubin, Direct: 0 mg/dL (ref 0.0–0.3)
Total Protein: 7.1 g/dL (ref 6.0–8.3)

## 2011-12-18 LAB — URINALYSIS, ROUTINE W REFLEX MICROSCOPIC
Hgb urine dipstick: NEGATIVE
Ketones, ur: NEGATIVE
Leukocytes, UA: NEGATIVE
Specific Gravity, Urine: 1.01 (ref 1.000–1.030)
Urobilinogen, UA: 0.2 (ref 0.0–1.0)

## 2011-12-18 LAB — CBC WITH DIFFERENTIAL/PLATELET
Basophils Absolute: 0 10*3/uL (ref 0.0–0.1)
Eosinophils Absolute: 0.2 10*3/uL (ref 0.0–0.7)
Lymphocytes Relative: 29.3 % (ref 12.0–46.0)
MCHC: 33 g/dL (ref 30.0–36.0)
Neutrophils Relative %: 59.8 % (ref 43.0–77.0)
RDW: 13.9 % (ref 11.5–14.6)

## 2011-12-18 LAB — BASIC METABOLIC PANEL
CO2: 30 mEq/L (ref 19–32)
Chloride: 103 mEq/L (ref 96–112)
Sodium: 142 mEq/L (ref 135–145)

## 2011-12-18 LAB — MICROALBUMIN / CREATININE URINE RATIO
Creatinine,U: 76.6 mg/dL
Microalb Creat Ratio: 1.6 mg/g (ref 0.0–30.0)

## 2011-12-18 LAB — LIPID PANEL
Cholesterol: 216 mg/dL — ABNORMAL HIGH (ref 0–200)
Total CHOL/HDL Ratio: 5

## 2011-12-18 LAB — LDL CHOLESTEROL, DIRECT: Direct LDL: 152.9 mg/dL

## 2011-12-18 MED ORDER — INSULIN ASPART 100 UNIT/ML ~~LOC~~ SOLN: SUBCUTANEOUS | Status: DC

## 2011-12-18 MED ORDER — VALSARTAN-HYDROCHLOROTHIAZIDE 160-12.5 MG PO TABS: 1.0000 | ORAL_TABLET | Freq: Every day | ORAL | Status: DC

## 2011-12-18 NOTE — Progress Notes (Signed)
Subjective:    Patient ID: Gail Lynch, female    DOB: October 02, 1940, 71 y.o.   MRN: [id number]  HPI  Here for wellness and f/u;  Overall doing ok;  Pt denies CP, worsening SOB, DOE, wheezing, orthopnea, PND, worsening LE edema, palpitations, dizziness or syncope.  Pt denies neurological change such as new Headache, facial or extremity weakness.  Pt denies polydipsia, polyuria, or low sugar symptoms. Pt states overall good compliance with treatment and medications, good tolerability, and trying to follow lower cholesterol diet.  Pt denies worsening depressive symptoms, suicidal ideation or panic. No fever, wt loss, night sweats, loss of appetite, or other constitutional symptoms.  Pt states good ability with ADL's, low fall risk, home safety reviewed and adequate, no significant changes in hearing or vision, and occasionally active with exercise.  No acute complaints.  No fever or recent falls.  Due for shots today.  Needs refills as well Past Medical History  Diagnosis Date  . Diabetes mellitus   . Hypertension   . Brain mass 2005  . Hard of hearing   . Dysplasia of cervix, low grade (CIN 1)     s/p cervical conization  . HPV (human papilloma virus) infection   . Asthma   . Glaucoma   . Hyperlipidemia   . Colon polyps   . Intrinsic asthma, unspecified 12/18/2011  . Glaucoma 12/18/2011   Past Surgical History  Procedure Date  . Breast biopsy   . Knee surgery 2010    patella fracture  . Breast biopsy 1991    reports that she quit smoking about 21 years ago. She has quit using smokeless tobacco. She reports that she does not drink alcohol or use illicit drugs. family history includes Asthma in an unspecified family member; Diabetes in her brother; and Heart disease in her brother, father, and mother. Allergies  Allergen Reactions  . Aspirin Other (See Comments)    BLEEDING BEHIND EYES  . Penicillins    Current Outpatient Prescriptions on File Prior to Visit  Medication Sig Dispense  Refill  . Cholecalciferol (VITAMIN D3) 1000 UNITS CAPS Take 1 capsule by mouth daily.      . fish oil-omega-3 fatty acids 1000 MG capsule Take 2 g by mouth daily.      . Flaxseed, Linseed, (FLAX SEED OIL) 1000 MG CAPS Take 3 capsules by mouth daily.      . Garlic 1000 MG CAPS Take 1 capsule by mouth daily.      . insulin aspart (NOVOLOG) 100 UNIT/ML injection Inject into the skin 3 (three) times daily before meals. Take 40 units in the morning and 35 at night      . pyridoxine ([employer] VITAMIN B-6) 200 MG tablet Take 200 mg by mouth daily.      . valsartan-hydrochlorothiazide (DIOVAN-HCT) 160-12.5 MG per tablet Take 1 tablet by mouth daily.      . vitamin B-12 (CYANOCOBALAMIN) 500 MCG tablet Take 500 mcg by mouth daily. Take 1 to 3 tablets daily       Review of Systems Review of Systems  Constitutional: Negative for diaphoresis, activity change, appetite change and unexpected weight change.  HENT: Negative for hearing loss, ear pain, facial swelling, mouth sores and neck stiffness.   Eyes: Negative for pain, redness and visual disturbance.  Respiratory: Negative for shortness of breath and wheezing.   Cardiovascular: Negative for chest pain and palpitations.  Gastrointestinal: Negative for diarrhea, blood in stool, abdominal distention and rectal pain.  Genitourinary: Negative for  hematuria, flank pain and decreased urine volume.  Musculoskeletal: Negative for myalgias and joint swelling.  Skin: Negative for color change and wound.  Neurological: Negative for syncope and numbness.  Hematological: Negative for adenopathy.  Psychiatric/Behavioral: Negative for hallucinations, self-injury, decreased concentration and agitation.      Objective:   Physical Exam BP 138/62  Pulse 71  Temp(Src) 98.9 F (37.2 C) (Oral)  Ht 5\' 3"  (1.6 m)  Wt 218 lb 8 oz (99.111 kg)  BMI 38.71 kg/m2  SpO2 98% Physical Exam  VS noted, obese Constitutional: Pt is oriented to person, place, and time. Appears  well-developed and well-nourished.  HENT:  Head: Normocephalic and atraumatic.  Right Ear: External ear normal.  Left Ear: External ear normal.  Nose: Nose normal.  Mouth/Throat: Oropharynx is clear and moist.  Eyes: Conjunctivae and EOM are normal. Pupils are equal, round, and reactive to light.  Neck: Normal range of motion. Neck supple. No JVD present. No tracheal deviation present.  Cardiovascular: Normal rate, regular rhythm, normal heart sounds and intact distal pulses.   Pulmonary/Chest: Effort normal and breath sounds normal.  Abdominal: Soft. Bowel sounds are normal. There is no tenderness.  Musculoskeletal: Normal range of motion. Exhibits no edema.  Lymphadenopathy:  Has no cervical adenopathy.  Neurological: Pt is alert and oriented to person, place, and time. Pt has normal reflexes. No cranial nerve deficit. Motor/gait intact Skin: Skin is warm and dry. No rash noted.  Psychiatric:  Has  normal mood and affect. Behavior is normal.     Assessment & Plan:

## 2011-12-18 NOTE — Assessment & Plan Note (Signed)
Overall doing well, age appropriate education and counseling updated, referrals for preventative services and immunizations addressed, dietary and smoking counseling addressed, most recent labs and ECG reviewed.  I have personally reviewed and have noted: 1) the patient's medical and social history 2) The pt's use of alcohol, tobacco, and illicit drugs 3) The patient's current medications and supplements 4) Functional ability including ADL's, fall risk, home safety risk, hearing and visual impairment 5) Diet and physical activities 6) Evidence for depression or mood disorder 7) The patient's height, weight, and BMI have been recorded in the chart I have made referrals, and provided counseling and education based on review of the above For tetanus and pneumovax, routine labs today, consider shingles shot

## 2011-12-18 NOTE — Assessment & Plan Note (Signed)
stable overall by hx and exam, most recent data reviewed with pt, and pt to continue medical treatment as before; ; for labs today

## 2011-12-18 NOTE — Progress Notes (Signed)
Addended by: Scharlene Gloss B on: 12/18/2011 02:33 PM   Modules accepted: Orders

## 2011-12-18 NOTE — Patient Instructions (Addendum)
Please consider calling your insurance to see if the shingles shot is covered; if so, make Nurse Appt for the shot Please remember to followup with your GYN for the yearly pap smear and/or mammogram as you normally do You had the tetanus and pneumonia shots today Please go to LAB in the Basement for the blood and/or urine tests to be done today You will be contacted by phone if any changes need to be made immediately.  Otherwise, you will receive a letter about your results with an explanation. Your refills were done as requested Please return in 6 mo with Lab testing done 3-5 days before

## 2011-12-19 ENCOUNTER — Encounter: Payer: Self-pay | Admitting: Internal Medicine

## 2011-12-19 ENCOUNTER — Telehealth: Payer: Self-pay | Admitting: Internal Medicine

## 2011-12-19 MED ORDER — ATORVASTATIN CALCIUM 20 MG PO TABS: 20.0000 mg | ORAL_TABLET | Freq: Every day | ORAL | Status: DC

## 2011-12-19 MED ORDER — INSULIN ASPART 100 UNIT/ML ~~LOC~~ SOLN: SUBCUTANEOUS | Status: DC

## 2011-12-19 NOTE — Telephone Encounter (Signed)
Patient informed of increase of insulin and to start Lipitor.

## 2011-12-19 NOTE — Telephone Encounter (Signed)
Message copied by Corwin Levins on Tue Dec 19, 2011 12:44 PM ------      Message from: Scharlene Gloss B      Created: Tue Dec 19, 2011 11:59 AM       Called the patient and she takes 40 units in the AM and 35 units in the PM

## 2011-12-19 NOTE — Telephone Encounter (Signed)
Labs ok except a1c high at just over 9, and LDL too high as well (needs to be less than 70)  To increase the 70/30 to 50 in the AM, 45 units in the PM  To start lipitor 20 qd  F/u labs next visit  Robin to inform pt, I will do rx's

## 2011-12-21 ENCOUNTER — Other Ambulatory Visit: Payer: Self-pay | Admitting: Internal Medicine

## 2011-12-21 DIAGNOSIS — Z1231 Encounter for screening mammogram for malignant neoplasm of breast: Secondary | ICD-10-CM

## 2012-01-08 ENCOUNTER — Telehealth: Payer: Self-pay

## 2012-01-08 NOTE — Telephone Encounter (Signed)
Pt called stating that her CBGs are still elevated 200+. Pt has increased her units at bedtime to 45. Pt says that she has previously used Novolog 70/30 that controlled her CBGs better. Please advise for this Dr Jonny Ruiz patient, thanks!

## 2012-01-08 NOTE — Telephone Encounter (Signed)
Increase novolog to 60 units 2x a day (just before the first and last meals of the day)

## 2012-01-08 NOTE — Telephone Encounter (Signed)
Pt informed of MD's advisement. 

## 2012-01-17 ENCOUNTER — Telehealth: Payer: Self-pay | Admitting: Internal Medicine

## 2012-01-17 NOTE — Telephone Encounter (Signed)
I am pretty sure her DM is managed per Dr Willow Ora;  Please ask pt to call Dr Chestine Spore if she is seeing him for DM

## 2012-01-17 NOTE — Telephone Encounter (Signed)
Caller: Gail Lynch/Patient; PCP: Oliver Barre; CB#: [phone];  Call regarding Would Like Her Insulin Rx Changed; Pt's insulin was changed to Novalog Insulin recently. Taking 40 U in AM, 35 U in PM. She has increased her insulin to 50U AM, 45U PM as her BS has been running high. BS's are still in the 200's. She ? if she can go back on Novalog 70/30 as this controlled her BS. Diabetes: Control Problems. PLS CALL.

## 2012-01-17 NOTE — Telephone Encounter (Signed)
Called the patient and she is no longer seeing Dr. Chestine Spore.  She states she does not see an Endo. MD.

## 2012-01-17 NOTE — Telephone Encounter (Signed)
Since her case is more complicated, I would like to have her see Dr Glenna Fellows here;  Let me know if that is ok please

## 2012-01-18 ENCOUNTER — Ambulatory Visit (HOSPITAL_COMMUNITY)
Admission: RE | Admit: 2012-01-18 | Discharge: 2012-01-18 | Disposition: A | Discharge status: Home / Self Care | Payer: Medicare Other | Source: Ambulatory Visit | Attending: Internal Medicine | Admitting: Internal Medicine

## 2012-01-18 DIAGNOSIS — Z1231 Encounter for screening mammogram for malignant neoplasm of breast: Secondary | ICD-10-CM

## 2012-01-18 NOTE — Telephone Encounter (Signed)
Called the patient could not leave a message and no answer

## 2012-01-18 NOTE — Telephone Encounter (Signed)
Called the patient and she is ok to see Dr. Everardo All.

## 2012-01-18 NOTE — Telephone Encounter (Signed)
Done per emr 

## 2012-01-26 ENCOUNTER — Encounter: Payer: Self-pay | Admitting: Obstetrics and Gynecology

## 2012-01-26 ENCOUNTER — Other Ambulatory Visit (HOSPITAL_COMMUNITY)
Admission: RE | Admit: 2012-01-26 | Discharge: 2012-01-26 | Disposition: A | Discharge status: Home / Self Care | Payer: Medicare Other | Source: Ambulatory Visit | Attending: Obstetrics and Gynecology | Admitting: Obstetrics and Gynecology

## 2012-01-26 ENCOUNTER — Ambulatory Visit (INDEPENDENT_AMBULATORY_CARE_PROVIDER_SITE_OTHER): Payer: Medicare Other | Admitting: Obstetrics and Gynecology

## 2012-01-26 VITALS — BP 128/63 | HR 80 | Temp 97.9°F | Ht 62.0 in | Wt 219.6 lb

## 2012-01-26 DIAGNOSIS — Z01419 Encounter for gynecological examination (general) (routine) without abnormal findings: Secondary | ICD-10-CM

## 2012-01-26 DIAGNOSIS — Z1159 Encounter for screening for other viral diseases: Secondary | ICD-10-CM | POA: Insufficient documentation

## 2012-01-26 DIAGNOSIS — Z124 Encounter for screening for malignant neoplasm of cervix: Secondary | ICD-10-CM | POA: Insufficient documentation

## 2012-01-26 NOTE — Patient Instructions (Signed)
Preventive Care for Adults, Female A healthy lifestyle and preventive care can promote health and wellness. Preventive health guidelines for women include the following key practices.  A routine yearly physical is a good way to check with your caregiver about your health and preventive screening. It is a chance to share any concerns and updates on your health, and to receive a thorough exam.   Visit your dentist for a routine exam and preventive care every 6 months. Brush your teeth twice a day and floss once a day. Good oral hygiene prevents tooth decay and gum disease.   The frequency of eye exams is based on your age, health, family medical history, use of contact lenses, and other factors. Follow your caregiver's recommendations for frequency of eye exams.   Eat a healthy diet. Foods like vegetables, fruits, whole grains, low-fat dairy products, and lean protein foods contain the nutrients you need without too many calories. Decrease your intake of foods high in solid fats, added sugars, and salt. Eat the right amount of calories for you.Get information about a proper diet from your caregiver, if necessary.   Regular physical exercise is one of the most important things you can do for your health. Most adults should get at least 150 minutes of moderate-intensity exercise (any activity that increases your heart rate and causes you to sweat) each week. In addition, most adults need muscle-strengthening exercises on 2 or more days a week.   Maintain a healthy weight. The body mass index (BMI) is a screening tool to identify possible weight problems. It provides an estimate of body fat based on height and weight. Your caregiver can help determine your BMI, and can help you achieve or maintain a healthy weight.For adults 20 years and older:   A BMI below 18.5 is considered underweight.   A BMI of 18.5 to 24.9 is normal.   A BMI of 25 to 29.9 is considered overweight.   A BMI of 30 and above  is considered obese.   Maintain normal blood lipids and cholesterol levels by exercising and minimizing your intake of saturated fat. Eat a balanced diet with plenty of fruit and vegetables. Blood tests for lipids and cholesterol should begin at age 20 and be repeated every 5 years. If your lipid or cholesterol levels are high, you are over 50, or you are at high risk for heart disease, you may need your cholesterol levels checked more frequently.Ongoing high lipid and cholesterol levels should be treated with medicines if diet and exercise are not effective.   If you smoke, find out from your caregiver how to quit. If you do not use tobacco, do not start.   If you are pregnant, do not drink alcohol. If you are breastfeeding, be very cautious about drinking alcohol. If you are not pregnant and choose to drink alcohol, do not exceed 1 drink per day. One drink is considered to be 12 ounces (355 mL) of beer, 5 ounces (148 mL) of wine, or 1.5 ounces (44 mL) of liquor.   Avoid use of street drugs. Do not share needles with anyone. Ask for help if you need support or instructions about stopping the use of drugs.   High blood pressure causes heart disease and increases the risk of stroke. Your blood pressure should be checked at least every 1 to 2 years. Ongoing high blood pressure should be treated with medicines if weight loss and exercise are not effective.   If you are 55 to 73   years old, ask your caregiver if you should take aspirin to prevent strokes.   Diabetes screening involves taking a blood sample to check your fasting blood sugar level. This should be done once every 3 years, after age 45, if you are within normal weight and without risk factors for diabetes. Testing should be considered at a younger age or be carried out more frequently if you are overweight and have at least 1 risk factor for diabetes.   Breast cancer screening is essential preventive care for women. You should practice  "breast self-awareness." This means understanding the normal appearance and feel of your breasts and may include breast self-examination. Any changes detected, no matter how small, should be reported to a caregiver. Women in their 20s and 30s should have a clinical breast exam (CBE) by a caregiver as part of a regular health exam every 1 to 3 years. After age 40, women should have a CBE every year. Starting at age 40, women should consider having a mammography (breast X-ray test) every year. Women who have a family history of breast cancer should talk to their caregiver about genetic screening. Women at a high risk of breast cancer should talk to their caregivers about having magnetic resonance imaging (MRI) and a mammography every year.   The Pap test is a screening test for cervical cancer. A Pap test can show cell changes on the cervix that might become cervical cancer if left untreated. A Pap test is a procedure in which cells are obtained and examined from the lower end of the uterus (cervix).   Women should have a Pap test starting at age 21.   Between ages 21 and 29, Pap tests should be repeated every 2 years.   Beginning at age 30, you should have a Pap test every 3 years as long as the past 3 Pap tests have been normal.   Some women have medical problems that increase the chance of getting cervical cancer. Talk to your caregiver about these problems. It is especially important to talk to your caregiver if a new problem develops soon after your last Pap test. In these cases, your caregiver may recommend more frequent screening and Pap tests.   The above recommendations are the same for women who have or have not gotten the vaccine for human papillomavirus (HPV).   If you had a hysterectomy for a problem that was not cancer or a condition that could lead to cancer, then you no longer need Pap tests. Even if you no longer need a Pap test, a regular exam is a good idea to make sure no other  problems are starting.   If you are between ages 65 and 70, and you have had normal Pap tests going back 10 years, you no longer need Pap tests. Even if you no longer need a Pap test, a regular exam is a good idea to make sure no other problems are starting.   If you have had past treatment for cervical cancer or a condition that could lead to cancer, you need Pap tests and screening for cancer for at least 20 years after your treatment.   If Pap tests have been discontinued, risk factors (such as a new sexual partner) need to be reassessed to determine if screening should be resumed.   The HPV test is an additional test that may be used for cervical cancer screening. The HPV test looks for the virus that can cause the cell changes on the cervix.   The cells collected during the Pap test can be tested for HPV. The HPV test could be used to screen women aged 30 years and older, and should be used in women of any age who have unclear Pap test results. After the age of 30, women should have HPV testing at the same frequency as a Pap test.   Colorectal cancer can be detected and often prevented. Most routine colorectal cancer screening begins at the age of 50 and continues through age 75. However, your caregiver may recommend screening at an earlier age if you have risk factors for colon cancer. On a yearly basis, your caregiver may provide home test kits to check for hidden blood in the stool. Use of a small camera at the end of a tube, to directly examine the colon (sigmoidoscopy or colonoscopy), can detect the earliest forms of colorectal cancer. Talk to your caregiver about this at age 50, when routine screening begins. Direct examination of the colon should be repeated every 5 to 10 years through age 75, unless early forms of pre-cancerous polyps or small growths are found.   Hepatitis C blood testing is recommended for all people born from 1945 through 1965 and any individual with known risks for  hepatitis C.   Practice safe sex. Use condoms and avoid high-risk sexual practices to reduce the spread of sexually transmitted infections (STIs). STIs include gonorrhea, chlamydia, syphilis, trichomonas, herpes, HPV, and human immunodeficiency virus (HIV). Herpes, HIV, and HPV are viral illnesses that have no cure. They can result in disability, cancer, and death. Sexually active women aged 25 and younger should be checked for chlamydia. Older women with new or multiple partners should also be tested for chlamydia. Testing for other STIs is recommended if you are sexually active and at increased risk.   Osteoporosis is a disease in which the bones lose minerals and strength with aging. This can result in serious bone fractures. The risk of osteoporosis can be identified using a bone density scan. Women ages 65 and over and women at risk for fractures or osteoporosis should discuss screening with their caregivers. Ask your caregiver whether you should take a calcium supplement or vitamin D to reduce the rate of osteoporosis.   Menopause can be associated with physical symptoms and risks. Hormone replacement therapy is available to decrease symptoms and risks. You should talk to your caregiver about whether hormone replacement therapy is right for you.   Use sunscreen with sun protection factor (SPF) of 30 or more. Apply sunscreen liberally and repeatedly throughout the day. You should seek shade when your shadow is shorter than you. Protect yourself by wearing long sleeves, pants, a wide-brimmed hat, and sunglasses year round, whenever you are outdoors.   Once a month, do a whole body skin exam, using a mirror to look at the skin on your back. Notify your caregiver of new moles, moles that have irregular borders, moles that are larger than a pencil eraser, or moles that have changed in shape or color.   Stay current with required immunizations.   Influenza. You need a dose every fall (or winter). The  composition of the flu vaccine changes each year, so being vaccinated once is not enough.   Pneumococcal polysaccharide. You need 1 to 2 doses if you smoke cigarettes or if you have certain chronic medical conditions. You need 1 dose at age 65 (or older) if you have never been vaccinated.   Tetanus, diphtheria, pertussis (Tdap, Td). Get 1 dose of   Tdap vaccine if you are younger than age 65, are over 65 and have contact with an infant, are a healthcare worker, are pregnant, or simply want to be protected from whooping cough. After that, you need a Td booster dose every 10 years. Consult your caregiver if you have not had at least 3 tetanus and diphtheria-containing shots sometime in your life or have a deep or dirty wound.   HPV. You need this vaccine if you are a woman age 26 or younger. The vaccine is given in 3 doses over 6 months.   Measles, mumps, rubella (MMR). You need at least 1 dose of MMR if you were born in 1957 or later. You may also need a second dose.   Meningococcal. If you are age 19 to 21 and a first-year college student living in a residence hall, or have one of several medical conditions, you need to get vaccinated against meningococcal disease. You may also need additional booster doses.   Zoster (shingles). If you are age 60 or older, you should get this vaccine.   Varicella (chickenpox). If you have never had chickenpox or you were vaccinated but received only 1 dose, talk to your caregiver to find out if you need this vaccine.   Hepatitis A. You need this vaccine if you have a specific risk factor for hepatitis A virus infection or you simply wish to be protected from this disease. The vaccine is usually given as 2 doses, 6 to 18 months apart.   Hepatitis B. You need this vaccine if you have a specific risk factor for hepatitis B virus infection or you simply wish to be protected from this disease. The vaccine is given in 3 doses, usually over 6 months.  Preventive Services /  Frequency Ages 65 and over  Blood pressure check.** / Every 1 to 2 years.   Lipid and cholesterol check.** / Every 5 years beginning at age 20.   Clinical breast exam.** / Every year after age 40.   Mammogram.** / Every year beginning at age 40 and continuing for as long as you are in good health. Consult with your caregiver.   Pap test.** / Every 3 years starting at age 30 through age 65 or 70 with a 3 consecutive normal Pap tests. Testing can be stopped between 65 and 70 with 3 consecutive normal Pap tests and no abnormal Pap or HPV tests in the past 10 years.   HPV screening.** / Every 3 years from ages 30 through ages 65 or 70 with a history of 3 consecutive normal Pap tests. Testing can be stopped between 65 and 70 with 3 consecutive normal Pap tests and no abnormal Pap or HPV tests in the past 10 years.   Fecal occult blood test (FOBT) of stool. / Every year beginning at age 50 and continuing until age 75. You may not need to do this test if you get a colonoscopy every 10 years.   Flexible sigmoidoscopy or colonoscopy.** / Every 5 years for a flexible sigmoidoscopy or every 10 years for a colonoscopy beginning at age 50 and continuing until age 75.   Hepatitis C blood test.** / For all people born from 1945 through 1965 and any individual with known risks for hepatitis C.   Osteoporosis screening.** / A one-time screening for women ages 65 and over and women at risk for fractures or osteoporosis.   Skin self-exam. / Monthly.   Influenza immunization.** / Every year.   Pneumococcal polysaccharide immunization.** /   1 dose at age 65 (or older) if you have never been vaccinated.   Tetanus, diphtheria, pertussis (Tdap, Td) immunization. / A one-time dose of Tdap vaccine if you are over 65 and have contact with an infant, are a healthcare worker, or simply want to be protected from whooping cough. After that, you need a Td booster dose every 10 years.   Varicella immunization.** /  Consult your caregiver.   Meningococcal immunization.** / Consult your caregiver.   Hepatitis A immunization.** / Consult your caregiver. 2 doses, 6 to 18 months apart.   Hepatitis B immunization.** / Check with your caregiver. 3 doses, usually over 6 months.  ** Family history and personal history of risk and conditions may change your caregiver's recommendations. Document Released: 10/03/2001 Document Revised: 07/27/2011 Document Reviewed: 01/02/2011 [employer] Patient Information 2012 [employer]. 

## 2012-01-26 NOTE — Progress Notes (Signed)
  Subjective:    Patient ID: Gail Lynch, female    DOB: [birth year], 72 y.o.   MRN: [id number]  HPI  71 yo postmenopausal G3P3 with BMI 40 presenting today for annual exam. Patient is currently without complaints. She is not sexually active. Patient has had her mammogram on 01/19/2012.  Past Medical History  Diagnosis Date  . Diabetes mellitus   . Hypertension   . Hard of hearing   . Dysplasia of cervix, low grade (CIN 1)     s/p cervical conization  . HPV (human papilloma virus) infection   . Asthma   . Glaucoma   . Hyperlipidemia   . Colon polyps   . Intrinsic asthma, unspecified 12/18/2011  . Glaucoma 12/18/2011   Past Surgical History  Procedure Date  . Breast biopsy   . Knee surgery 2010    patella fracture  . Breast biopsy 1991   Family History  Problem Relation Age of Onset  . Diabetes Brother   . Heart disease Brother   . Heart disease Mother   . Heart disease Father   . Asthma     History  Substance Use Topics  . Smoking status: Former Smoker    Quit date: 08/21/1990  . Smokeless tobacco: Former Neurosurgeon  . Alcohol Use: No    Review of Systems  All other systems reviewed and are negative.       Objective:   Physical Exam GENERAL: Well-developed, well-nourished female in no acute distress.  HEENT: Normocephalic, atraumatic. Sclerae anicteric.  NECK: Supple. Normal thyroid.  LUNGS: Clear to auscultation bilaterally.  HEART: Regular rate and rhythm. BREASTS: Symmetric in size. No palpable masses or lymphadenopathy, skin changes, or nipple drainage. ABDOMEN: Soft, nontender, nondistended. No organomegaly. Obese PELVIC: Normal external female genitalia. Vagina is atrophic.  Normal discharge. Normal appearing cervix. Bimanual limited secondary to body habitus.  No adnexal mass or tenderness. EXTREMITIES: No cyanosis, clubbing, or edema, 2+ distal pulses.     Assessment & Plan:  72 yo here for annual exam - pap smear performed - patient advised to  perform monthly vulva and breast exam - Patient to follow-up with PCP for other medical conditions

## 2012-01-30 ENCOUNTER — Encounter: Payer: Self-pay | Admitting: Endocrinology

## 2012-01-30 ENCOUNTER — Ambulatory Visit (INDEPENDENT_AMBULATORY_CARE_PROVIDER_SITE_OTHER): Payer: Medicare Other | Admitting: Endocrinology

## 2012-01-30 VITALS — BP 122/58 | HR 71 | Temp 98.5°F | Ht 63.0 in | Wt 221.0 lb

## 2012-01-30 DIAGNOSIS — E1065 Type 1 diabetes mellitus with hyperglycemia: Secondary | ICD-10-CM

## 2012-01-30 DIAGNOSIS — E1039 Type 1 diabetes mellitus with other diabetic ophthalmic complication: Secondary | ICD-10-CM

## 2012-01-30 DIAGNOSIS — R0989 Other specified symptoms and signs involving the circulatory and respiratory systems: Secondary | ICD-10-CM

## 2012-01-30 DIAGNOSIS — H579 Unspecified disorder of eye and adnexa: Secondary | ICD-10-CM

## 2012-01-30 MED ORDER — INSULIN ASPART PROT & ASPART (70-30 MIX) 100 UNIT/ML ~~LOC~~ SUSP: SUBCUTANEOUS | Status: DC

## 2012-01-30 NOTE — Progress Notes (Signed)
Subjective:    Patient ID: Gail Lynch, female    DOB: 03/02/1941, 71 y.o.   MRN: [id number]  HPI pt states 22 years h/o dm, complicated by peripheral sensory neuropathy.  she has been on insulin since dx.  pt says his diet and exercise are not good.   Pt states few years of slight numbness of the hands and feet, and assoc pain. She is willing to take insulin as often as bid.   Past Medical History  Diagnosis Date  . Diabetes mellitus   . Hypertension   . Hard of hearing   . Dysplasia of cervix, low grade (CIN 1)     s/p cervical conization  . HPV (human papilloma virus) infection   . Asthma   . Glaucoma   . Hyperlipidemia   . Colon polyps   . Intrinsic asthma, unspecified 12/18/2011  . Glaucoma 12/18/2011    Past Surgical History  Procedure Date  . Breast biopsy   . Knee surgery 2010    patella fracture  . Breast biopsy 1991    History   Social History  . Marital Status: Widowed    Spouse Name: N/A    Number of Children: 3  . Years of Education: N/A   Occupational History  . retired    Social History Main Topics  . Smoking status: Former Smoker    Quit date: 08/21/1990  . Smokeless tobacco: Former Neurosurgeon  . Alcohol Use: No  . Drug Use: No  . Sexually Active: Not on file   Other Topics Concern  . Not on file   Social History Narrative  . No narrative on file    Current Outpatient Prescriptions on File Prior to Visit  Medication Sig Dispense Refill  . acetaminophen (TYLENOL) 325 MG tablet Take 650 mg by mouth as needed.      Marland Kitchen atorvastatin (LIPITOR) 20 MG tablet Take 1 tablet (20 mg total) by mouth daily.  90 tablet  3  . Cholecalciferol (VITAMIN D3) 1000 UNITS CAPS Take 1 capsule by mouth daily.      . fish oil-omega-3 fatty acids 1000 MG capsule Take 2 g by mouth daily.      . Flaxseed, Linseed, (FLAX SEED OIL) 1000 MG CAPS Take 3 capsules by mouth daily.      . Garlic 1000 MG CAPS Take 1 capsule by mouth daily.      . insulin aspart (NOVOLOG) 100  UNIT/ML injection 35-50 Units 2 (two) times daily before a meal. Sliding scale per pt      . Polyethylene Glycol 3350 (MIRALAX PO) Take by mouth daily.      Marland Kitchen pyridoxine ([employer] VITAMIN B-6) 200 MG tablet Take 200 mg by mouth daily.      . valsartan-hydrochlorothiazide (DIOVAN-HCT) 160-12.5 MG per tablet Take 1 tablet by mouth daily.  90 tablet  3  . vitamin B-12 (CYANOCOBALAMIN) 500 MCG tablet Take 500 mcg by mouth daily. Take 1 to 3 tablets daily        Allergies  Allergen Reactions  . Aspirin Other (See Comments)    BLEEDING BEHIND EYES  . Penicillins     Family History  Problem Relation Age of Onset  . Diabetes Brother   . Heart disease Brother   . Heart disease Mother   . Heart disease Father   . Asthma      BP 122/58  Pulse 71  Temp(Src) 98.5 F (36.9 C) (Oral)  Ht 5\' 3"  (1.6 m)  Wt  221 lb (100.245 kg)  BMI 39.15 kg/m2  SpO2 94%  Review of Systems denies blurry vision, headache, chest pain, sob, n/v, urinary frequency, excessive diaphoresis, memory loss, depression, menopausal sxs, rhinorrhea, and easy bruising.  She reports weight gain and leg cramps.    Objective:   Physical Exam VS: see vs page GEN: no distress.  Obese. HEAD: head: no deformity eyes: no periorbital swelling, no proptosis external nose and ears are normal mouth: no lesion seen NECK: supple, thyroid is not enlarged CHEST WALL: no deformity LUNGS:  Clear to auscultation. CV: reg rate and rhythm, no murmur ABD: abdomen is soft, nontender.  no hepatosplenomegaly.  not distended.  no hernia. MUSCULOSKELETAL: muscle bulk and strength are grossly normal.  no obvious joint swelling.  gait is normal and steady EXTEMITIES: no deformity.  no ulcer on the feet.  feet are of normal color and temp.  1+ bilat leg edema.  There is bilateral onychomycosis PULSES: dorsalis pedis intact bilat.  There are bilat carotid bruits NEURO:  cn 2-12 grossly intact.   readily moves all 4's.  sensation is intact to touch on  the feet, but decreased from normal SKIN:  Normal texture and temperature.  No rash or suspicious lesion is visible.   NODES:  None palpable at the neck PSYCH: alert, oriented x3.  Does not appear anxious nor depressed. Lab Results  Component Value Date   HGBA1C 9.1* 12/18/2011      Assessment & Plan:  DM.  needs increased rx Neuropathy, due to DM.  This limits exercise rx Carotid bruits, incidentally noted.  New Weight gain.  This complicates the rx of DM.

## 2012-01-30 NOTE — Patient Instructions (Addendum)
good diet and exercise habits significanly improve the control of your diabetes.  please let me know if you wish to be referred to a dietician.  high blood sugar is very risky to your health.  you should see an eye doctor every year. controlling your blood pressure and cholesterol drastically reduces the damage diabetes does to your body.  this also applies to quitting smoking.  please discuss these with your doctor.  you should take an aspirin every day, unless you have been advised by a doctor not to. check your blood sugar twice a day.  vary the time of day when you check, between before the 3 meals, and at bedtime.  also check if you have symptoms of your blood sugar being too high or too low.  please keep a record of the readings and bring it to your next appointment here.  please call us sooner if your blood sugar goes below 70, or if it stays over 200. For now: Change nolovog to novolog 70/30.  i have sent a prescription to your pharmacy. You should have a "carotid doppler" test.  It is easy and painless.  you will receive a phone call, about a day and time for an appointment.   Please come back for a follow-up appointment in 3 weeks.

## 2012-02-09 ENCOUNTER — Other Ambulatory Visit: Payer: Self-pay | Admitting: Cardiology

## 2012-02-09 DIAGNOSIS — R0989 Other specified symptoms and signs involving the circulatory and respiratory systems: Secondary | ICD-10-CM

## 2012-02-12 ENCOUNTER — Encounter (INDEPENDENT_AMBULATORY_CARE_PROVIDER_SITE_OTHER): Payer: Medicare Other

## 2012-02-12 DIAGNOSIS — R0989 Other specified symptoms and signs involving the circulatory and respiratory systems: Secondary | ICD-10-CM

## 2012-02-12 DIAGNOSIS — I6529 Occlusion and stenosis of unspecified carotid artery: Secondary | ICD-10-CM

## 2012-02-13 ENCOUNTER — Telehealth: Payer: Self-pay | Admitting: *Deleted

## 2012-02-13 ENCOUNTER — Encounter: Payer: Self-pay | Admitting: Endocrinology

## 2012-02-13 NOTE — Telephone Encounter (Signed)
Called pt to inform of results of carotid dopplar, pt informed (letter also mailed to pt).

## 2012-02-20 ENCOUNTER — Ambulatory Visit (INDEPENDENT_AMBULATORY_CARE_PROVIDER_SITE_OTHER): Payer: Medicare Other | Admitting: Endocrinology

## 2012-02-20 ENCOUNTER — Encounter: Payer: Self-pay | Admitting: Endocrinology

## 2012-02-20 ENCOUNTER — Ambulatory Visit: Payer: Medicare Other | Admitting: Internal Medicine

## 2012-02-20 VITALS — BP 122/70 | HR 74 | Temp 98.4°F | Ht 63.0 in | Wt 219.0 lb

## 2012-02-20 DIAGNOSIS — E1039 Type 1 diabetes mellitus with other diabetic ophthalmic complication: Secondary | ICD-10-CM

## 2012-02-20 DIAGNOSIS — H579 Unspecified disorder of eye and adnexa: Secondary | ICD-10-CM

## 2012-02-20 NOTE — Progress Notes (Signed)
Subjective:    Patient ID: Gail Lynch, female    DOB: September 14, 1940, 71 y.o.   MRN: [id number]  HPI Pt return for f/u of IDDM (dx'ed 1990; complicated by peripheral sensory neuropathy. she brings a record of her cbg's which i have reviewed today.  It varies from 90-300, but most are in the 100's Past Medical History  Diagnosis Date  . Diabetes mellitus   . Hypertension   . Hard of hearing   . Dysplasia of cervix, low grade (CIN 1)     s/p cervical conization  . HPV (human papilloma virus) infection   . Asthma   . Glaucoma   . Hyperlipidemia   . Colon polyps   . Intrinsic asthma, unspecified 12/18/2011  . Glaucoma 12/18/2011    Past Surgical History  Procedure Date  . Breast biopsy   . Knee surgery 2010    patella fracture  . Breast biopsy 1991    History   Social History  . Marital Status: Widowed    Spouse Name: N/A    Number of Children: 3  . Years of Education: N/A   Occupational History  . retired    Social History Main Topics  . Smoking status: Former Smoker    Quit date: 08/21/1990  . Smokeless tobacco: Former Neurosurgeon  . Alcohol Use: No  . Drug Use: No  . Sexually Active: Not on file   Other Topics Concern  . Not on file   Social History Narrative  . No narrative on file    Current Outpatient Prescriptions on File Prior to Visit  Medication Sig Dispense Refill  . acetaminophen (TYLENOL) 325 MG tablet Take 650 mg by mouth as needed.      . ALLOPURINOL PO Take by mouth. 1/2 tablet by mouth as needed for gout sxs-pt unsure of dosage.      Marland Kitchen atorvastatin (LIPITOR) 20 MG tablet Take 1 tablet (20 mg total) by mouth daily.  90 tablet  3  . Cholecalciferol (VITAMIN D3) 1000 UNITS CAPS Take 1 capsule by mouth daily.      . fish oil-omega-3 fatty acids 1000 MG capsule Take 2 g by mouth daily.      . Flaxseed, Linseed, (FLAX SEED OIL) 1000 MG CAPS Take 3 capsules by mouth daily.      . Garlic 1000 MG CAPS Take 1 capsule by mouth daily.      . insulin aspart  protamine-insulin aspart (NOVOLOG MIX 70/30) (70-30) 100 UNIT/ML injection 40 units with breakfast, and 35 with the evening meal  30 mL  12  . Polyethylene Glycol 3350 (MIRALAX PO) Take by mouth daily.      Marland Kitchen pyridoxine ([employer]S VITAMIN B-6) 200 MG tablet Take 200 mg by mouth daily.      . valsartan-hydrochlorothiazide (DIOVAN-HCT) 160-12.5 MG per tablet Take 1 tablet by mouth daily.  90 tablet  3  . vitamin B-12 (CYANOCOBALAMIN) 500 MCG tablet Take 500 mcg by mouth daily. Take 1 to 3 tablets daily        Allergies  Allergen Reactions  . Aspirin Other (See Comments)    BLEEDING BEHIND EYES  . Penicillins     Family History  Problem Relation Age of Onset  . Diabetes Brother   . Heart disease Brother   . Heart disease Mother   . Heart disease Father   . Asthma      BP 122/70  Pulse 74  Temp 98.4 F (36.9 C) (Oral)  Ht 5\' 3"  (  1.6 m)  Wt 219 lb (99.338 kg)  BMI 38.79 kg/m2  SpO2 96%  Review of Systems denies hypoglycemia    Objective:   Physical Exam VITAL SIGNS:  See vs page GENERAL: no distress SKIN:  Insulin injection sites at the anterior abdomen are normal.     Assessment & Plan:  DM, with improved control.

## 2012-02-20 NOTE — Patient Instructions (Addendum)
check your blood sugar twice a day.  vary the time of day when you check, between before the 3 meals, and at bedtime.  also check if you have symptoms of your blood sugar being too high or too low.  please keep a record of the readings and bring it to your next appointment here.  please call us sooner if your blood sugar goes below 70, or if it stays over 200.   Please continue the same insulin for now.   Please come back for a follow-up appointment in 2 months.

## 2012-04-23 ENCOUNTER — Other Ambulatory Visit (INDEPENDENT_AMBULATORY_CARE_PROVIDER_SITE_OTHER): Payer: Medicare Other

## 2012-04-23 ENCOUNTER — Ambulatory Visit (INDEPENDENT_AMBULATORY_CARE_PROVIDER_SITE_OTHER): Payer: Medicare Other | Admitting: Endocrinology

## 2012-04-23 ENCOUNTER — Encounter: Payer: Self-pay | Admitting: Endocrinology

## 2012-04-23 VITALS — BP 110/58 | HR 72 | Temp 99.2°F | Resp 15 | Wt 221.4 lb

## 2012-04-23 DIAGNOSIS — H579 Unspecified disorder of eye and adnexa: Secondary | ICD-10-CM

## 2012-04-23 DIAGNOSIS — E1065 Type 1 diabetes mellitus with hyperglycemia: Secondary | ICD-10-CM

## 2012-04-23 DIAGNOSIS — E1039 Type 1 diabetes mellitus with other diabetic ophthalmic complication: Secondary | ICD-10-CM

## 2012-04-23 NOTE — Patient Instructions (Addendum)
check your blood sugar twice a day.  vary the time of day when you check, between before the 3 meals, and at bedtime.  also check if you have symptoms of your blood sugar being too high or too low.  please keep a record of the readings and bring it to your next appointment here.  please call us sooner if your blood sugar goes below 70, or if it stays over 200.   blood tests are being requested for you today.  You will receive a letter with results.   Change insulin to 40 units twice a day: with breakfast, and with the evening meal.   Please come back for a follow-up appointment in 3 months.

## 2012-04-23 NOTE — Progress Notes (Signed)
Subjective:    Patient ID: Gail Lynch, female    DOB: 1941/01/06, 70 y.o.   MRN: [id number]  HPI Pt return for f/u of IDDM (dx'ed 1990; complicated by peripheral sensory neuropathy.  no cbg record, but states cbg's are highest at hs, and lower at all other times of day.  It varies from 100-200's.  Past Medical History  Diagnosis Date  . Diabetes mellitus   . Hypertension   . Hard of hearing   . Dysplasia of cervix, low grade (CIN 1)     s/p cervical conization  . HPV (human papilloma virus) infection   . Asthma   . Glaucoma   . Hyperlipidemia   . Colon polyps   . Intrinsic asthma, unspecified 12/18/2011  . Glaucoma 12/18/2011    Past Surgical History  Procedure Date  . Breast biopsy   . Knee surgery 2010    patella fracture  . Breast biopsy 1991    History   Social History  . Marital Status: Widowed    Spouse Name: N/A    Number of Children: 3  . Years of Education: N/A   Occupational History  . retired    Social History Main Topics  . Smoking status: Former Smoker    Quit date: 08/21/1990  . Smokeless tobacco: Former Neurosurgeon  . Alcohol Use: No  . Drug Use: No  . Sexually Active: Not on file   Other Topics Concern  . Not on file   Social History Narrative  . No narrative on file    Current Outpatient Prescriptions on File Prior to Visit  Medication Sig Dispense Refill  . acetaminophen (TYLENOL) 325 MG tablet Take 650 mg by mouth as needed.      . ALLOPURINOL PO Take by mouth. 1/2 tablet by mouth as needed for gout sxs-pt unsure of dosage.      Marland Kitchen atorvastatin (LIPITOR) 20 MG tablet Take 1 tablet (20 mg total) by mouth daily.  90 tablet  3  . Cholecalciferol (VITAMIN D3) 1000 UNITS CAPS Take 1 capsule by mouth daily.      . fish oil-omega-3 fatty acids 1000 MG capsule Take 2 g by mouth daily.      . Flaxseed, Linseed, (FLAX SEED OIL) 1000 MG CAPS Take 3 capsules by mouth daily.      . Garlic 1000 MG CAPS Take 1 capsule by mouth daily.      . insulin  aspart protamine-insulin aspart (NOVOLOG 70/30) (70-30) 100 UNIT/ML injection Inject 40 Units into the skin 2 (two) times daily with a meal.       . Polyethylene Glycol 3350 (MIRALAX PO) Take by mouth daily.      Marland Kitchen pyridoxine (CVS VITAMIN B-6) 200 MG tablet Take 200 mg by mouth daily.      . valsartan-hydrochlorothiazide (DIOVAN-HCT) 160-12.5 MG per tablet Take 1 tablet by mouth daily.  90 tablet  3  . vitamin B-12 (CYANOCOBALAMIN) 500 MCG tablet Take 500 mcg by mouth daily. Take 1 to 3 tablets daily        Allergies  Allergen Reactions  . Aspirin Other (See Comments)    BLEEDING BEHIND EYES  . Penicillins     Family History  Problem Relation Age of Onset  . Diabetes Brother   . Heart disease Brother   . Heart disease Mother   . Heart disease Father   . Asthma      BP 110/58  Pulse 72  Temp 99.2 F (37.3 C) (Oral)  Resp 15  Wt 221 lb 6 oz (100.415 kg)  SpO2 95%  Review of Systems denies hypoglycemia.      Objective:   Physical Exam VITAL SIGNS:  See vs page GENERAL: no distress NECK: There is no palpable thyroid enlargement.  No thyroid nodule is palpable.  No palpable lymphadenopathy at the anterior neck.     Lab Results  Component Value Date   HGBA1C 9.0* 04/23/2012      Assessment & Plan:  DM: needs increased rx

## 2012-04-29 ENCOUNTER — Telehealth: Payer: Self-pay | Admitting: Internal Medicine

## 2012-04-29 NOTE — Telephone Encounter (Signed)
For your information. Pt has appt made already.

## 2012-04-29 NOTE — Telephone Encounter (Signed)
°  Caller: Gail Lynch/Patient; PCP: Gail Lynch (Adults only); Best Callback Phone Number: [phone].  Calling about Constipation.  Last ususal bowel movement was 3 weeks ago and last small bowel movment 04/28/12.   She is taking her Miralax every day, but not Flax seed.  She is having abdominal bloating that started the last week in August 2013.   Triaged Adbominal Distention and all emergent symptoms ruled out.  Needs to be seen in 72 hours for Chronic abdominal swelling (ascites) and is greadually increasing.   Appointment made for 04/30/12 with Gail Lynch at 1615.  Reviewed home care and instrucitons given for constipation diet.  Call back instructions given.

## 2012-04-30 ENCOUNTER — Ambulatory Visit (INDEPENDENT_AMBULATORY_CARE_PROVIDER_SITE_OTHER): Payer: Medicare Other | Admitting: Internal Medicine

## 2012-04-30 ENCOUNTER — Encounter: Payer: Self-pay | Admitting: Internal Medicine

## 2012-04-30 VITALS — BP 112/60 | HR 78 | Temp 98.6°F | Ht 62.0 in | Wt 220.0 lb

## 2012-04-30 DIAGNOSIS — I10 Essential (primary) hypertension: Secondary | ICD-10-CM

## 2012-04-30 DIAGNOSIS — K5900 Constipation, unspecified: Secondary | ICD-10-CM

## 2012-04-30 DIAGNOSIS — E785 Hyperlipidemia, unspecified: Secondary | ICD-10-CM

## 2012-04-30 MED ORDER — LINACLOTIDE 145 MCG PO CAPS: 145.0000 ug | ORAL_CAPSULE | Freq: Every day | ORAL | Status: DC

## 2012-04-30 NOTE — Assessment & Plan Note (Signed)
If constipation not improved, consider hold lipitor, o/w Continue all other medications as before;

## 2012-04-30 NOTE — Assessment & Plan Note (Signed)
stable overall by hx and exam, most recent data reviewed with pt, and pt to continue medical treatment as before BP Readings from Last 3 Encounters:  04/30/12 112/60  04/23/12 110/58  02/20/12 122/70

## 2012-04-30 NOTE — Progress Notes (Signed)
Subjective:    Patient ID: Gail Lynch, female    DOB: [birth year], 71 y.o.   MRN: [id number]  HPI  Here to f/u; c/o 1-2 wks onset mild abd discomfort,with some bloating and distension assoc with decreased BM only every 3-4 days, mild nausea but no vomiting, fever, wt loss, blood or diarrhea.  No recent change in meds, otc meds, diet, activity.  Has been a recurrent problem for 3-4 months. Pt denies chest pain, increased sob or doe, wheezing, orthopnea, PND, increased LE swelling, palpitations, dizziness or syncope.   Pt denies polydipsia, polyuria, Pt states overall good compliance with meds, trying to follow lower cholesterol, diabetic diet, wt overall stable but little exercise however.   Sees Dr Everardo All for Dm with neuropathy.  Overall good compliance with treatment, and good medicine tolerability.  Denies worsening reflux, dysphagia.  Last colonoscpoy 09/2011 reviewed  - no malignancy Past Medical History  Diagnosis Date  . Diabetes mellitus   . Hypertension   . Hard of hearing   . Dysplasia of cervix, low grade (CIN 1)     s/p cervical conization  . HPV (human papilloma virus) infection   . Asthma   . Glaucoma   . Hyperlipidemia   . Colon polyps   . Intrinsic asthma, unspecified 12/18/2011  . Glaucoma 12/18/2011   Past Surgical History  Procedure Date  . Breast biopsy   . Knee surgery 2010    patella fracture  . Breast biopsy 1991    reports that she quit smoking about 21 years ago. She has quit using smokeless tobacco. She reports that she does not drink alcohol or use illicit drugs. family history includes Asthma in an unspecified family member; Diabetes in her brother; and Heart disease in her brother, father, and mother. Allergies  Allergen Reactions  . Aspirin Other (See Comments)    BLEEDING BEHIND EYES  . Penicillins    Current Outpatient Prescriptions on File Prior to Visit  Medication Sig Dispense Refill  . acetaminophen (TYLENOL) 325 MG tablet Take 650 mg by mouth  as needed.      . ALLOPURINOL PO Take by mouth. 1/2 tablet by mouth as needed for gout sxs-pt unsure of dosage.      Marland Kitchen atorvastatin (LIPITOR) 20 MG tablet Take 1 tablet (20 mg total) by mouth daily.  90 tablet  3  . Cholecalciferol (VITAMIN D3) 1000 UNITS CAPS Take 1 capsule by mouth daily.      . fish oil-omega-3 fatty acids 1000 MG capsule Take 2 g by mouth daily.      . Flaxseed, Linseed, (FLAX SEED OIL) 1000 MG CAPS Take 3 capsules by mouth daily.      . Garlic 1000 MG CAPS Take 1 capsule by mouth daily.      . insulin aspart protamine-insulin aspart (NOVOLOG 70/30) (70-30) 100 UNIT/ML injection Inject 40 Units into the skin 2 (two) times daily with a meal.       . Polyethylene Glycol 3350 (MIRALAX PO) Take by mouth daily.      Marland Kitchen pyridoxine ([employer]S VITAMIN B-6) 200 MG tablet Take 200 mg by mouth daily.      . valsartan-hydrochlorothiazide (DIOVAN-HCT) 160-12.5 MG per tablet Take 1 tablet by mouth daily.  90 tablet  3  . vitamin B-12 (CYANOCOBALAMIN) 500 MCG tablet Take 500 mcg by mouth daily. Take 1 to 3 tablets daily      . Linaclotide (LINZESS) 145 MCG CAPS Take 1 capsule (145 mcg total) by mouth daily.  30 capsule  11   Review of Systems Review of Systems  Constitutional: Negative for diaphoresis and unexpected weight change.  HENT: Negative for tinnitus.   Eyes: Negative for photophobia and visual disturbance.  Respiratory: Negative for choking and stridor.   Gastrointestinal: Negative for vomiting and blood in stool.  Genitourinary: Negative for hematuria and decreased urine volume.  Musculoskeletal: Negative for gait problem.  Skin: Negative for color change and wound.    Objective:   Physical Exam BP 112/60  Pulse 78  Temp 98.6 F (37 C) (Oral)  Ht 5\' 2"  (1.575 m)  Wt 220 lb (99.791 kg)  BMI 40.24 kg/m2  SpO2 96% Physical Exam  VS noted, not ill appearing Constitutional: Pt appears well-developed and well-nourished.  HENT: Head: Normocephalic.  Right Ear: External ear  normal.  Left Ear: External ear normal.  Eyes: Conjunctivae and EOM are normal. Pupils are equal, round, and reactive to light.  Neck: Normal range of motion. Neck supple.  Cardiovascular: Normal rate and regular rhythm.   Pulmonary/Chest: Effort normal and breath sounds normal.  Abd:  Soft, + BS but somewhat firm, mild distended, diffused tender, no guarding or rebound Neurological: Pt is alert. Not confused Skin: Skin is warm. No erythema.  Psychiatric: Pt behavior is normal. Thought content normal.     Assessment & Plan:

## 2012-04-30 NOTE — Patient Instructions (Addendum)
Take all new medications as prescribed Continue all other medications as before  

## 2012-04-30 NOTE — Assessment & Plan Note (Signed)
D/w pt; to try senakot qhs prn, but if not improved to try linzess 145 qd - gave samples x 2 wks, then rx if needed

## 2012-06-12 ENCOUNTER — Other Ambulatory Visit (INDEPENDENT_AMBULATORY_CARE_PROVIDER_SITE_OTHER): Payer: Medicare Other

## 2012-06-12 DIAGNOSIS — E1039 Type 1 diabetes mellitus with other diabetic ophthalmic complication: Secondary | ICD-10-CM

## 2012-06-12 DIAGNOSIS — E785 Hyperlipidemia, unspecified: Secondary | ICD-10-CM

## 2012-06-12 LAB — BASIC METABOLIC PANEL
Calcium: 9.3 mg/dL (ref 8.4–10.5)
Creatinine, Ser: 1.2 mg/dL (ref 0.4–1.2)
GFR: 55.5 mL/min — ABNORMAL LOW (ref >60.00)
Sodium: 143 mEq/L (ref 135–145)

## 2012-06-12 LAB — LIPID PANEL
HDL: 35.7 mg/dL — ABNORMAL LOW (ref >39.00)
LDL Cholesterol: 85 mg/dL (ref 0–99)
Total CHOL/HDL Ratio: 4
Triglycerides: 99 mg/dL (ref 0.0–149.0)
VLDL: 19.8 mg/dL (ref 0.0–40.0)

## 2012-06-18 ENCOUNTER — Ambulatory Visit (INDEPENDENT_AMBULATORY_CARE_PROVIDER_SITE_OTHER): Payer: Medicare Other | Admitting: Internal Medicine

## 2012-06-18 ENCOUNTER — Encounter: Payer: Self-pay | Admitting: Internal Medicine

## 2012-06-18 VITALS — BP 112/70 | HR 70 | Temp 98.5°F | Ht 63.0 in | Wt 217.2 lb

## 2012-06-18 DIAGNOSIS — I10 Essential (primary) hypertension: Secondary | ICD-10-CM

## 2012-06-18 DIAGNOSIS — I6529 Occlusion and stenosis of unspecified carotid artery: Secondary | ICD-10-CM

## 2012-06-18 DIAGNOSIS — Z0000 Encounter for general adult medical examination without abnormal findings: Secondary | ICD-10-CM

## 2012-06-18 DIAGNOSIS — E1065 Type 1 diabetes mellitus with hyperglycemia: Secondary | ICD-10-CM

## 2012-06-18 DIAGNOSIS — E1039 Type 1 diabetes mellitus with other diabetic ophthalmic complication: Secondary | ICD-10-CM

## 2012-06-18 DIAGNOSIS — E785 Hyperlipidemia, unspecified: Secondary | ICD-10-CM

## 2012-06-18 HISTORY — DX: Occlusion and stenosis of unspecified carotid artery: I65.29

## 2012-06-18 MED ORDER — VALSARTAN-HYDROCHLOROTHIAZIDE 160-12.5 MG PO TABS: 1.0000 | ORAL_TABLET | Freq: Every day | ORAL | Status: DC

## 2012-06-18 MED ORDER — ATORVASTATIN CALCIUM 40 MG PO TABS: 40.0000 mg | ORAL_TABLET | Freq: Every day | ORAL | Status: DC

## 2012-06-18 NOTE — Progress Notes (Signed)
Subjective:    Patient ID: Gail Lynch, female    DOB: Jun 18, 1941, 71 y.o.   MRN: [id number]  HPI  Here to f/u; overall doing ok,  Pt denies chest pain, increased sob or doe, wheezing, orthopnea, PND, increased LE swelling, palpitations, dizziness or syncope.  Pt denies new neurological symptoms such as new headache, or facial or extremity weakness or numbness   Pt denies polydipsia, polyuria, or low sugar symptoms such as weakness or confusion improved with po intake.  Pt states overall good compliance with meds, trying to follow lower cholesterol, diabetic diet, wt overall stable but little exercise however.  Sees Dr Glenna Fellows with appt dec 4.  Overall good compliance with treatment, and good medicine tolerability.   Pt denies fever, wt loss, night sweats, loss of appetite, or other constitutional symptoms.  Denies worsening depressive symptoms, suicidal ideation, or panic, though has ongoing anxiety, not increased recently.  Past Medical History  Diagnosis Date  . Diabetes mellitus   . Hypertension   . Hard of hearing   . Dysplasia of cervix, low grade (CIN 1)     s/p cervical conization  . HPV (human papilloma virus) infection   . Asthma   . Glaucoma   . Hyperlipidemia   . Colon polyps   . Intrinsic asthma, unspecified 12/18/2011  . Glaucoma(365) 12/18/2011  . Carotid stenosis 06/18/2012   Past Surgical History  Procedure Date  . Breast biopsy   . Knee surgery 2010    patella fracture  . Breast biopsy 1991    reports that she quit smoking about 21 years ago. She has quit using smokeless tobacco. She reports that she does not drink alcohol or use illicit drugs. family history includes Asthma in an unspecified family member; Diabetes in her brother; and Heart disease in her brother, father, and mother. Allergies  Allergen Reactions  . Aspirin Other (See Comments)    BLEEDING BEHIND EYES  . Penicillins    Current Outpatient Prescriptions on File Prior to Visit  Medication  Sig Dispense Refill  . acetaminophen (TYLENOL) 325 MG tablet Take 650 mg by mouth as needed.      . ALLOPURINOL PO Take by mouth. 1/2 tablet by mouth as needed for gout sxs-pt unsure of dosage.      . Cholecalciferol (VITAMIN D3) 1000 UNITS CAPS Take 1 capsule by mouth daily.      . fish oil-omega-3 fatty acids 1000 MG capsule Take 2 g by mouth daily.      . Flaxseed, Linseed, (FLAX SEED OIL) 1000 MG CAPS Take 3 capsules by mouth daily.      . Garlic 1000 MG CAPS Take 1 capsule by mouth daily.      . insulin aspart protamine-insulin aspart (NOVOLOG 70/30) (70-30) 100 UNIT/ML injection Inject 40 Units into the skin 2 (two) times daily with a meal.       . Polyethylene Glycol 3350 (MIRALAX PO) Take by mouth daily.      Marland Kitchen pyridoxine ([employer] VITAMIN B-6) 200 MG tablet Take 200 mg by mouth daily.      . vitamin B-12 (CYANOCOBALAMIN) 500 MCG tablet Take 500 mcg by mouth daily. Take 1 to 3 tablets daily      . DISCONTD: valsartan-hydrochlorothiazide (DIOVAN-HCT) 160-12.5 MG per tablet Take 1 tablet by mouth daily.  90 tablet  3  . Linaclotide (LINZESS) 145 MCG CAPS Take 1 capsule (145 mcg total) by mouth daily.  30 capsule  11   Review of Systems  Constitutional: Negative for diaphoresis and unexpected weight change.  HENT: Negative for tinnitus.   Eyes: Negative for photophobia and visual disturbance.  Respiratory: Negative for choking and stridor.   Gastrointestinal: Negative for vomiting and blood in stool.  Genitourinary: Negative for hematuria and decreased urine volume.  Musculoskeletal: Negative for gait problem.  Skin: Negative for color change and wound.  Neurological: Negative for tremors and numbness.  Psychiatric/Behavioral: Negative for decreased concentration. The patient is not hyperactive.       Objective:   Physical Exam BP 112/70  Pulse 70  Temp 98.5 F (36.9 C) (Oral)  Ht 5\' 3"  (1.6 m)  Wt 217 lb 4 oz (98.544 kg)  BMI 38.48 kg/m2  SpO2 98% Physical Exam  VS  noted Constitutional: Pt appears well-developed and well-nourished.  HENT: Head: Normocephalic.  Right Ear: External ear normal.  Left Ear: External ear normal.  Eyes: Conjunctivae and EOM are normal. Pupils are equal, round, and reactive to light.  Neck: Normal range of motion. Neck supple.  Cardiovascular: Normal rate and regular rhythm.   Pulmonary/Chest: Effort normal and breath sounds normal.  Abd:  Soft, NT, non-distended, + BS Neurological: Pt is alert. Not confused  Skin: Skin is warm. No erythema.  Psychiatric: Pt behavior is normal. Thought content normal.     Assessment & Plan:

## 2012-06-18 NOTE — Assessment & Plan Note (Signed)
To incr the lipitor to 40 mg, goal ldl < 70, cont diet,, f/u lab next visit

## 2012-06-18 NOTE — Assessment & Plan Note (Signed)
stable overall by hx and exam, most recent data reviewed with pt, and pt to continue medical treatment as before Lab Results  Component Value Date   WBC 7.1 12/18/2011   HGB 12.1 12/18/2011   HCT 36.7 12/18/2011   PLT 336.0 12/18/2011   GLUCOSE 123* 06/12/2012   CHOL 140 06/12/2012   TRIG 99.0 06/12/2012   HDL 35.70* 06/12/2012   LDLDIRECT 152.9 12/18/2011   LDLCALC 85 06/12/2012   ALT 22 12/18/2011   AST 18 12/18/2011   NA 143 06/12/2012   K 4.3 06/12/2012   CL 106 06/12/2012   CREATININE 1.2 06/12/2012   BUN 11 06/12/2012   CO2 28 06/12/2012   TSH 1.37 12/18/2011   INR 1.2 09/04/2008   HGBA1C 9.0* 06/12/2012   MICROALBUR 1.2 12/18/2011   BP med refilled today

## 2012-06-18 NOTE — Assessment & Plan Note (Signed)
D/w pt - has < 60% RICA - needs risk factor intensification to help reduce risk of progression, neuro stable, declines ASA as she has had what sounds like retinal bleeding with laser in the past on ASA, so declines to take further

## 2012-06-18 NOTE — Patient Instructions (Addendum)
Please increase the lipitor to 40 mg per day (a new prescription is sent to your pharmacy) Your Diovan HCT was also refilled Continue all other medications as before Please keep your appointments with your specialists as you have planned - Dr Everardo All for the sugar Dec 4 Your next colonoscopy would be due in 2016 Your next Carotid Artery testing would be due about June 2014 to check on the Right carotid blockage Please return if you change your mind about the flu shot Please return in 6 mo with Lab testing done 3-5 days before

## 2012-06-18 NOTE — Assessment & Plan Note (Signed)
Uncontrolled, sees Dr Everardo All, stressed to pt importance of diet and med compliacne, to f/u endo as planned

## 2012-07-24 ENCOUNTER — Ambulatory Visit: Payer: Medicare Other | Admitting: Endocrinology

## 2012-07-27 ENCOUNTER — Emergency Department (HOSPITAL_COMMUNITY): Payer: Medicare Other

## 2012-07-27 ENCOUNTER — Encounter (HOSPITAL_COMMUNITY): Payer: Self-pay | Admitting: *Deleted

## 2012-07-27 ENCOUNTER — Emergency Department (HOSPITAL_COMMUNITY)
Admission: EM | Admit: 2012-07-27 | Discharge: 2012-07-27 | Disposition: A | Discharge status: Home / Self Care | Payer: Medicare Other | Attending: Emergency Medicine | Admitting: Emergency Medicine

## 2012-07-27 DIAGNOSIS — K5900 Constipation, unspecified: Secondary | ICD-10-CM

## 2012-07-27 DIAGNOSIS — Z860100 Personal history of colon polyps, unspecified: Secondary | ICD-10-CM | POA: Insufficient documentation

## 2012-07-27 DIAGNOSIS — J45909 Unspecified asthma, uncomplicated: Secondary | ICD-10-CM | POA: Insufficient documentation

## 2012-07-27 DIAGNOSIS — Z794 Long term (current) use of insulin: Secondary | ICD-10-CM | POA: Insufficient documentation

## 2012-07-27 DIAGNOSIS — N870 Mild cervical dysplasia: Secondary | ICD-10-CM | POA: Insufficient documentation

## 2012-07-27 DIAGNOSIS — E119 Type 2 diabetes mellitus without complications: Secondary | ICD-10-CM | POA: Insufficient documentation

## 2012-07-27 DIAGNOSIS — Z87891 Personal history of nicotine dependence: Secondary | ICD-10-CM | POA: Insufficient documentation

## 2012-07-27 DIAGNOSIS — I10 Essential (primary) hypertension: Secondary | ICD-10-CM | POA: Insufficient documentation

## 2012-07-27 DIAGNOSIS — E785 Hyperlipidemia, unspecified: Secondary | ICD-10-CM | POA: Insufficient documentation

## 2012-07-27 DIAGNOSIS — Z79899 Other long term (current) drug therapy: Secondary | ICD-10-CM | POA: Insufficient documentation

## 2012-07-27 LAB — URINALYSIS, ROUTINE W REFLEX MICROSCOPIC
Bilirubin Urine: NEGATIVE
Protein, ur: NEGATIVE mg/dL
Urobilinogen, UA: 0.2 mg/dL (ref 0.0–1.0)

## 2012-07-27 LAB — CBC WITH DIFFERENTIAL/PLATELET
Basophils Absolute: 0 10*3/uL (ref 0.0–0.1)
Basophils Relative: 0 % (ref 0–1)
Eosinophils Absolute: 0.1 10*3/uL (ref 0.0–0.7)
Eosinophils Relative: 1 % (ref 0–5)
MCH: 28.7 pg (ref 26.0–34.0)
MCV: 88 fL (ref 78.0–100.0)
Platelets: 317 10*3/uL (ref 150–400)
RDW: 13.8 % (ref 11.5–15.5)
WBC: 10.8 10*3/uL — ABNORMAL HIGH (ref 4.0–10.5)

## 2012-07-27 LAB — URINE MICROSCOPIC-ADD ON

## 2012-07-27 LAB — COMPREHENSIVE METABOLIC PANEL
ALT: 16 U/L (ref 0–35)
BUN: 26 mg/dL — ABNORMAL HIGH (ref 6–23)
CO2: 25 mEq/L (ref 19–32)
Calcium: 9.5 mg/dL (ref 8.4–10.5)
Creatinine, Ser: 1.29 mg/dL — ABNORMAL HIGH (ref 0.50–1.10)
GFR calc Af Amer: 46 mL/min — ABNORMAL LOW (ref >90)
GFR calc non Af Amer: 40 mL/min — ABNORMAL LOW (ref >90)
Glucose, Bld: 299 mg/dL — ABNORMAL HIGH (ref 70–99)

## 2012-07-27 MED ORDER — SODIUM CHLORIDE 0.9 % IV SOLN
1000.0000 mL | INTRAVENOUS | Status: DC
  Administered 2012-07-27 (×2): 1000 mL via INTRAVENOUS

## 2012-07-27 MED ORDER — IOHEXOL 300 MG/ML  SOLN: 20.0000 mL | INTRAMUSCULAR | Status: AC

## 2012-07-27 MED ORDER — IOHEXOL 300 MG/ML  SOLN
80.0000 mL | Freq: Once | INTRAMUSCULAR | Status: AC | PRN
  Administered 2012-07-27: 80 mL via INTRAVENOUS

## 2012-07-27 MED ORDER — MORPHINE SULFATE 4 MG/ML IJ SOLN
4.0000 mg | Freq: Once | INTRAMUSCULAR | Status: AC
  Administered 2012-07-27: 4 mg via INTRAVENOUS
  Filled 2012-07-27: qty 1

## 2012-07-27 MED ORDER — ONDANSETRON HCL 4 MG/2ML IJ SOLN
4.0000 mg | Freq: Once | INTRAMUSCULAR | Status: AC
  Administered 2012-07-27: 4 mg via INTRAVENOUS
  Filled 2012-07-27: qty 2

## 2012-07-27 MED ORDER — DISPOSABLE ENEMA 19-7 GM/118ML RE ENEM: 1.0000 | ENEMA | Freq: Once | RECTAL | Status: DC

## 2012-07-27 MED ORDER — POLYETHYLENE GLYCOL 3350 17 GM/SCOOP PO POWD: 17.0000 g | Freq: Two times a day (BID) | ORAL | Status: DC

## 2012-07-27 NOTE — ED Notes (Signed)
Pt reports generalized abd pain that started early this am and having n/v. Reports small bowel movement this am. No other complaints. No acute distress noted at triage.

## 2012-07-27 NOTE — ED Provider Notes (Signed)
History     CSN: [id number]  Arrival date & time 07/27/12  1478   First MD Initiated Contact with Patient 07/27/12 [phone]      Chief Complaint  Patient presents with  . Abdominal Pain    (Consider location/radiation/quality/duration/timing/severity/associated sxs/prior treatment) Patient is a 71 y.o. female presenting with abdominal pain. The history is provided by the patient and medical records. No language interpreter was used.  Abdominal Pain The primary symptoms of the illness include abdominal pain. The primary symptoms of the illness do not include fever, nausea, vomiting or diarrhea. Primary symptoms comment: She had onset of left lower quadrant and suprapubic abdominal pain  around 4 or 5 A.M. today. The current episode started 3 to 5 hours ago. The onset of the illness was gradual. The problem has not changed (She says that her pain comes and goes, is a "cutting" pain.  There is no diarrhea.) since onset. Associated with: Nothing. The patient states that she believes she is currently not pregnant. The patient has not had a change in bowel habit. Risk factors for an acute abdominal problem include being elderly. Symptoms associated with the illness do not include chills. Associated medical issues comments: Diabetes..    Past Medical History  Diagnosis Date  . Diabetes mellitus   . Hypertension   . Hard of hearing   . Dysplasia of cervix, low grade (CIN 1)     s/p cervical conization  . HPV (human papilloma virus) infection   . Asthma   . Glaucoma   . Hyperlipidemia   . Colon polyps   . Intrinsic asthma, unspecified 12/18/2011  . Glaucoma(365) 12/18/2011  . Carotid stenosis 06/18/2012    Past Surgical History  Procedure Date  . Breast biopsy   . Knee surgery 2010    patella fracture  . Breast biopsy 1991    Family History  Problem Relation Age of Onset  . Diabetes Brother   . Heart disease Brother   . Heart disease Mother   . Heart disease Father   . Asthma       History  Substance Use Topics  . Smoking status: Former Smoker    Quit date: 08/21/1990  . Smokeless tobacco: Former Neurosurgeon  . Alcohol Use: No    OB History    Grav Para Term Preterm Abortions TAB SAB Ect Mult Living   3 3 3  0 0 0 0 0 0 3      Review of Systems  Constitutional: Negative.  Negative for fever and chills.  HENT: Negative.   Eyes: Negative.   Respiratory: Negative.   Cardiovascular: Negative.   Gastrointestinal: Positive for abdominal pain. Negative for nausea, vomiting and diarrhea.  Genitourinary: Negative.   Musculoskeletal: Negative.   Skin: Negative.   Neurological: Negative.   Psychiatric/Behavioral: Negative.     Allergies  Aspirin and Penicillins  Home Medications   Current Outpatient Rx  Name  Route  Sig  Dispense  Refill  . ATORVASTATIN CALCIUM 40 MG PO TABS   Oral   Take 40 mg by mouth daily.         Marland Kitchen VITAMIN D3 1000 UNITS PO CAPS   Oral   Take 1 capsule by mouth daily.         . OMEGA-3 FATTY ACIDS 1000 MG PO CAPS   Oral   Take 2 g by mouth daily.         Marland Kitchen FLAX SEED OIL 1000 MG PO CAPS   Oral  Take 3 capsules by mouth daily.         Marland Kitchen GARLIC 1000 MG PO CAPS   Oral   Take 1 capsule by mouth daily.         . INSULIN ASPART PROT & ASPART (70-30) 100 UNIT/ML [place] SUSP   Subcutaneous   Inject 40 Units into the skin 2 (two) times daily with a meal.          . MIRALAX PO   Oral   Take by mouth daily.         Marland Kitchen PYRIDOXINE HCL 200 MG PO TABS   Oral   Take 200 mg by mouth daily.         Marland Kitchen VALSARTAN-HYDROCHLOROTHIAZIDE 160-12.5 MG PO TABS   Oral   Take 1 tablet by mouth daily.         Marland Kitchen VITAMIN B-12 500 MCG PO TABS   Oral   Take 500 mcg by mouth daily. Take 1 to 3 tablets daily           BP 177/70  Pulse 70  Temp 97.7 F (36.5 C) (Oral)  Resp 18  SpO2 100%  Physical Exam  Nursing note and vitals reviewed. Constitutional: She is oriented to person, place, and time. She appears well-developed  and well-nourished. No distress.  HENT:  Head: Normocephalic and atraumatic.  Right Ear: External ear normal.  Left Ear: External ear normal.  Mouth/Throat: Oropharynx is clear and moist.  Eyes: Conjunctivae normal and EOM are normal. Pupils are equal, round, and reactive to light.  Neck: Normal range of motion. Neck supple.  Cardiovascular: Normal rate, regular rhythm and normal heart sounds.   Pulmonary/Chest: Effort normal and breath sounds normal.  Abdominal: Soft. Bowel sounds are normal.       She has mild left lower quadrant abdominal tenderness, no mass, rebound or rigidity.  Musculoskeletal: Normal range of motion.  Neurological: She is alert and oriented to person, place, and time.       No sensory or motor deficit.  Skin: Skin is warm and dry.  Psychiatric: She has a normal mood and affect. Her behavior is normal.    ED Course  Procedures (including critical care time)   Labs Reviewed  COMPREHENSIVE METABOLIC PANEL  LIPASE, BLOOD  URINALYSIS, ROUTINE W REFLEX MICROSCOPIC  CBC WITH DIFFERENTIAL   9:15 AM Pt seen --> physical exam performed.  Lab workup ordered.  Pt did not want any pain medicine at present; advised to use call bell to summon RN if pain came back.  1:37 PM Results for orders placed during the hospital encounter of 07/27/12  COMPREHENSIVE METABOLIC PANEL      Component Value Range   Sodium 138  135 - 145 mEq/L   Potassium 4.3  3.5 - 5.1 mEq/L   Chloride 102  96 - 112 mEq/L   CO2 25  19 - 32 mEq/L   Glucose, Bld 299 (*) 70 - 99 mg/dL   BUN 26 (*) 6 - 23 mg/dL   Creatinine, Ser 1.61 (*) 0.50 - 1.10 mg/dL   Calcium 9.5  8.4 - 09.6 mg/dL   Total Protein 7.4  6.0 - 8.3 g/dL   Albumin 3.3 (*) 3.5 - 5.2 g/dL   AST 23  0 - 37 U/L   ALT 16  0 - 35 U/L   Alkaline Phosphatase 91  39 - 117 U/L   Total Bilirubin 0.2 (*) 0.3 - 1.2 mg/dL   GFR calc non Af  Amer 40 (*) >90 mL/min   GFR calc Af Amer 46 (*) >90 mL/min  LIPASE, BLOOD      Component Value  Range   Lipase 43  11 - 59 U/L  URINALYSIS, ROUTINE W REFLEX MICROSCOPIC      Component Value Range   Color, Urine YELLOW  YELLOW   APPearance CLEAR  CLEAR   Specific Gravity, Urine 1.014  1.005 - 1.030   pH 6.5  5.0 - 8.0   Glucose, UA 100 (*) NEGATIVE mg/dL   Hgb urine dipstick SMALL (*) NEGATIVE   Bilirubin Urine NEGATIVE  NEGATIVE   Ketones, ur NEGATIVE  NEGATIVE mg/dL   Protein, ur NEGATIVE  NEGATIVE mg/dL   Urobilinogen, UA 0.2  0.0 - 1.0 mg/dL   Nitrite NEGATIVE  NEGATIVE   Leukocytes, UA TRACE (*) NEGATIVE  CBC WITH DIFFERENTIAL      Component Value Range   WBC 10.8 (*) 4.0 - 10.5 K/uL   RBC 4.15  3.87 - 5.11 MIL/uL   Hemoglobin 11.9 (*) 12.0 - 15.0 g/dL   HCT 16.1  09.6 - 04.5 %   MCV 88.0  78.0 - 100.0 fL   MCH 28.7  26.0 - 34.0 pg   MCHC 32.6  30.0 - 36.0 g/dL   RDW 40.9  81.1 - 91.4 %   Platelets 317  150 - 400 K/uL   Neutrophils Relative 85 (*) 43 - 77 %   Neutro Abs 9.2 (*) 1.7 - 7.7 K/uL   Lymphocytes Relative 12  12 - 46 %   Lymphs Abs 1.3  0.7 - 4.0 K/uL   Monocytes Relative 3  3 - 12 %   Monocytes Absolute 0.3  0.1 - 1.0 K/uL   Eosinophils Relative 1  0 - 5 %   Eosinophils Absolute 0.1  0.0 - 0.7 K/uL   Basophils Relative 0  0 - 1 %   Basophils Absolute 0.0  0.0 - 0.1 K/uL  URINE MICROSCOPIC-ADD ON      Component Value Range   Squamous Epithelial / LPF FEW (*) RARE   WBC, UA 0-2  <3 WBC/hpf   RBC / HPF 0-2  <3 RBC/hpf   Ct Abdomen Pelvis W Contrast  07/27/2012  *RADIOLOGY REPORT*  Clinical Data: Left lower quadrant abdominal pain.  CT ABDOMEN AND PELVIS WITH CONTRAST  Technique:  Multidetector CT imaging of the abdomen and pelvis was performed following the standard protocol during bolus administration of intravenous contrast.  Contrast: 80mL OMNIPAQUE IOHEXOL 300 MG/ML  SOLN  Comparison: None.  Findings: The lung bases demonstrate patchy areas of subsegmental atelectasis and mild peribronchial thickening.  The heart is upper limits of normal in size.   There is a small to moderate sized hiatal hernia.  The liver is unremarkable.  No focal hepatic lesions or intrahepatic biliary dilatation.  The gallbladder is normal.  No common bile duct dilatation.  The pancreas is unremarkable.  The spleen is normal.  The adrenal glands and kidneys are unremarkable. No renal or obstructing ureteral calculi.  The stomach is not well descend with contrast but no gross abnormalities are seen.  The small bowel and colon demonstrate no abnormalities.  No inflammatory changes or mass lesions.  The appendix is normal.  No mesenteric or retroperitoneal mass or adenopathy.  The aorta demonstrates moderate atherosclerotic calcifications but no focal aneurysm or dissection.  The major branch vessels are patent.  There is a small into abdominal wall hernia near the umbilicus but no bowel  obstruction.  The bladder is moderately distended.  The endometrium appears prominent for the patient's age.  A follow-up pelvic ultrasound is recommended (non urgent).  The ovaries are normal.  No pelvic mass, adenopathy or free pelvic fluid collections.  No inguinal mass or hernia.  The bony structures are intact.  IMPRESSION:  1.  No acute abdominal/pelvic findings, mass lesions or adenopathy. 2.  Moderate distention of the bladder. 3.  Thickened appearing endometrium for age.  Recommend follow-up pelvic ultrasound (non urgent). 4.  Anterior abdominal wall hernia at the umbilicus but no bowel obstruction. 5.  Patchy areas of subsegmental atelectasis at the lung bases.   Original Report Authenticated By: Rudie Meyer, M.D.    1:37 PM Review of her abdominal CT by me shows a significant amount of stool in the left colon.  Recommend Fleets enema today, and continued Rx with Miralax twice a day.    1. Constipation        Carleene Cooper III, MD 07/27/12 1345

## 2012-07-27 NOTE — ED Notes (Signed)
Gave pt a urine cup with a label to get a sample.9:52 am JG.

## 2012-08-06 ENCOUNTER — Encounter: Payer: Self-pay | Admitting: Endocrinology

## 2012-08-06 ENCOUNTER — Ambulatory Visit (INDEPENDENT_AMBULATORY_CARE_PROVIDER_SITE_OTHER): Payer: Medicare Other | Admitting: Endocrinology

## 2012-08-06 VITALS — BP 136/78 | HR 90 | Temp 98.7°F | Wt 218.0 lb

## 2012-08-06 DIAGNOSIS — N898 Other specified noninflammatory disorders of vagina: Secondary | ICD-10-CM

## 2012-08-06 DIAGNOSIS — N939 Abnormal uterine and vaginal bleeding, unspecified: Secondary | ICD-10-CM

## 2012-08-06 MED ORDER — INSULIN DETEMIR 100 UNIT/ML ~~LOC~~ SOLN: 80.0000 [IU] | Freq: Every day | SUBCUTANEOUS | Status: DC

## 2012-08-06 NOTE — Patient Instructions (Addendum)
check your blood sugar twice a day.  vary the time of day when you check, between before the 3 meals, and at bedtime.  also check if you have symptoms of your blood sugar being too high or too low.  please keep a record of the readings and bring it to your next appointment here.  please call us sooner if your blood sugar goes below 70, or if it stays over 200.    Change your current insulin to "levemir," 80 units every morning.  Please come back for a follow-up appointment in 3 weeks.

## 2012-08-06 NOTE — Progress Notes (Signed)
Subjective:    Patient ID: Gail Lynch, female    DOB: Mar 05, 1941, 71 y.o.   MRN: [id number]  HPI Pt return for f/u of IDDM (dx'ed 1990; complicated by peripheral sensory neuropathy).  no cbg record, but states cbg's continue to be variable.  There is no trend throughout the day.   Pt states few weeks of slight bleeding from the vagina, but no assoc brbpr. Past Medical History  Diagnosis Date  . Diabetes mellitus   . Hypertension   . Hard of hearing   . Dysplasia of cervix, low grade (CIN 1)     s/p cervical conization  . HPV (human papilloma virus) infection   . Asthma   . Glaucoma   . Hyperlipidemia   . Colon polyps   . Intrinsic asthma, unspecified 12/18/2011  . Glaucoma(365) 12/18/2011  . Carotid stenosis 06/18/2012    Past Surgical History  Procedure Date  . Breast biopsy   . Knee surgery 2010    patella fracture  . Breast biopsy 1991    History   Social History  . Marital Status: Widowed    Spouse Name: N/A    Number of Children: 3  . Years of Education: N/A   Occupational History  . retired    Social History Main Topics  . Smoking status: Former Smoker    Quit date: 08/21/1990  . Smokeless tobacco: Former Neurosurgeon  . Alcohol Use: No  . Drug Use: No  . Sexually Active: Not on file   Other Topics Concern  . Not on file   Social History Narrative  . No narrative on file    Current Outpatient Prescriptions on File Prior to Visit  Medication Sig Dispense Refill  . atorvastatin (LIPITOR) 40 MG tablet Take 40 mg by mouth daily.      . Cholecalciferol (VITAMIN D3) 1000 UNITS CAPS Take 1 capsule by mouth daily.      . fish oil-omega-3 fatty acids 1000 MG capsule Take 2 g by mouth daily.      . Flaxseed, Linseed, (FLAX SEED OIL) 1000 MG CAPS Take 3 capsules by mouth daily.      . Garlic 1000 MG CAPS Take 1 capsule by mouth daily.      . Polyethylene Glycol 3350 (MIRALAX PO) Take by mouth daily.      . polyethylene glycol powder (GLYCOLAX/MIRALAX) powder  Take 17 g by mouth 2 (two) times daily.  255 g  0  . pyridoxine ([employer]S VITAMIN B-6) 200 MG tablet Take 200 mg by mouth daily.      . sodium phosphate (FLEET) enema Place 1 enema rectally once. follow package directions  135 mL  0  . valsartan-hydrochlorothiazide (DIOVAN-HCT) 160-12.5 MG per tablet Take 1 tablet by mouth daily.      . vitamin B-12 (CYANOCOBALAMIN) 500 MCG tablet Take 500 mcg by mouth daily. Take 1 to 3 tablets daily      . insulin detemir (LEVEMIR) 100 UNIT/ML injection Inject 80 Units into the skin at bedtime.  30 mL  12    Allergies  Allergen Reactions  . Aspirin Other (See Comments)    BLEEDING BEHIND EYES  . Penicillins     Family History  Problem Relation Age of Onset  . Diabetes Brother   . Heart disease Brother   . Heart disease Mother   . Heart disease Father   . Asthma      BP 136/78  Pulse 90  Temp 98.7 F (37.1 C) (Oral)  Wt 218 lb (98.884 kg)  SpO2 97%  Review of Systems denies hypoglycemia and hematuria.    Objective:   Physical Exam VITAL SIGNS:  See vs page GENERAL: no distress Pulses: dorsalis pedis intact bilat.   Feet: no deformity.  no ulcer on the feet.  feet are of normal color and temp.  no edema.  There is bilateral onychomycosis Neuro: sensation is intact to touch on the feet  Lab Results  Component Value Date   HGBA1C 9.0* 06/12/2012      Assessment & Plan:  Vaginal bleeding, new, uncertain etiology DM, needs increased rx.  She may do better with a simpler regimen

## 2012-08-12 ENCOUNTER — Telehealth: Payer: Self-pay

## 2012-08-12 NOTE — Telephone Encounter (Signed)
Pt states dr. Everardo All offered pt a referral to gyn for vaginal bleeding now she would like to make appt?

## 2012-08-30 ENCOUNTER — Encounter: Payer: Self-pay | Admitting: Endocrinology

## 2012-08-30 ENCOUNTER — Ambulatory Visit (INDEPENDENT_AMBULATORY_CARE_PROVIDER_SITE_OTHER): Payer: Medicare Other | Admitting: Endocrinology

## 2012-08-30 VITALS — BP 136/80 | HR 84 | Temp 98.8°F | Wt 215.0 lb

## 2012-08-30 DIAGNOSIS — E1039 Type 1 diabetes mellitus with other diabetic ophthalmic complication: Secondary | ICD-10-CM

## 2012-08-30 NOTE — Patient Instructions (Addendum)
check your blood sugar twice a day.  vary the time of day when you check, between before the 3 meals, and at bedtime.  also check if you have symptoms of your blood sugar being too high or too low.  please keep a record of the readings and bring it to your next appointment here.  please call us sooner if your blood sugar goes below 70, or if it stays over 200.  Please increase levemir to 90 units every morning.  Please come back for a follow-up appointment in 1 month.

## 2012-08-30 NOTE — Progress Notes (Signed)
Subjective:    Patient ID: Gail Lynch, female    DOB: 01/27/1941, 72 y.o.   MRN: [id number]  HPI Pt return for f/u of IDDM (dx'ed 1990; complicated by peripheral sensory neuropathy).  no cbg record, but states cbg's vary from 100-300.  There is no trend throughout the day.  pt states she feels well in general. Past Medical History  Diagnosis Date  . Diabetes mellitus   . Hypertension   . Hard of hearing   . Dysplasia of cervix, low grade (CIN 1)     s/p cervical conization  . HPV (human papilloma virus) infection   . Asthma   . Glaucoma   . Hyperlipidemia   . Colon polyps   . Intrinsic asthma, unspecified 12/18/2011  . Glaucoma(365) 12/18/2011  . Carotid stenosis 06/18/2012    Past Surgical History  Procedure Date  . Breast biopsy   . Knee surgery 2010    patella fracture  . Breast biopsy 1991    History   Social History  . Marital Status: Widowed    Spouse Name: N/A    Number of Children: 3  . Years of Education: N/A   Occupational History  . retired    Social History Main Topics  . Smoking status: Former Smoker    Quit date: 08/21/1990  . Smokeless tobacco: Former Neurosurgeon  . Alcohol Use: No  . Drug Use: No  . Sexually Active: Not on file   Other Topics Concern  . Not on file   Social History Narrative  . No narrative on file    Current Outpatient Prescriptions on File Prior to Visit  Medication Sig Dispense Refill  . atorvastatin (LIPITOR) 40 MG tablet Take 40 mg by mouth daily.      . Cholecalciferol (VITAMIN D3) 1000 UNITS CAPS Take 1 capsule by mouth daily.      . fish oil-omega-3 fatty acids 1000 MG capsule Take 2 g by mouth daily.      . Flaxseed, Linseed, (FLAX SEED OIL) 1000 MG CAPS Take 3 capsules by mouth daily.      . Garlic 1000 MG CAPS Take 1 capsule by mouth daily.      . insulin detemir (LEVEMIR) 100 UNIT/ML injection Inject 90 Units into the skin every morning.      . Polyethylene Glycol 3350 (MIRALAX PO) Take by mouth daily.      .  polyethylene glycol powder (GLYCOLAX/MIRALAX) powder Take 17 g by mouth 2 (two) times daily.  255 g  0  . pyridoxine ([employer]S VITAMIN B-6) 200 MG tablet Take 200 mg by mouth daily.      . sodium phosphate (FLEET) enema Place 1 enema rectally once. follow package directions  135 mL  0  . valsartan-hydrochlorothiazide (DIOVAN-HCT) 160-12.5 MG per tablet Take 1 tablet by mouth daily.      . vitamin B-12 (CYANOCOBALAMIN) 500 MCG tablet Take 500 mcg by mouth daily. Take 1 to 3 tablets daily        Allergies  Allergen Reactions  . Aspirin Other (See Comments)    BLEEDING BEHIND EYES  . Penicillins     Family History  Problem Relation Age of Onset  . Diabetes Brother   . Heart disease Brother   . Heart disease Mother   . Heart disease Father   . Asthma      BP 136/80  Pulse 84  Temp 98.8 F (37.1 C) (Oral)  Wt 215 lb (97.523 kg)  SpO2 97%  Review of Systems denies hypoglycemia    Objective:   Physical Exam VITAL SIGNS:  See vs page GENERAL: no distress PSYCH: Alert and oriented x 3.  Does not appear anxious nor depressed.      Assessment & Plan:  DM: needs increased rx

## 2012-09-04 ENCOUNTER — Other Ambulatory Visit (HOSPITAL_COMMUNITY)
Admission: RE | Admit: 2012-09-04 | Discharge: 2012-09-04 | Disposition: A | Discharge status: Home / Self Care | Payer: Medicare Other | Source: Ambulatory Visit | Attending: Obstetrics & Gynecology | Admitting: Obstetrics & Gynecology

## 2012-09-04 ENCOUNTER — Ambulatory Visit (INDEPENDENT_AMBULATORY_CARE_PROVIDER_SITE_OTHER): Payer: Medicare Other | Admitting: Obstetrics & Gynecology

## 2012-09-04 ENCOUNTER — Ambulatory Visit (HOSPITAL_COMMUNITY)
Admission: RE | Admit: 2012-09-04 | Discharge: 2012-09-04 | Disposition: A | Discharge status: Home / Self Care | Payer: Medicare Other | Source: Ambulatory Visit | Attending: Obstetrics & Gynecology | Admitting: Obstetrics & Gynecology

## 2012-09-04 ENCOUNTER — Encounter: Payer: Self-pay | Admitting: Obstetrics & Gynecology

## 2012-09-04 VITALS — BP 157/68 | HR 90 | Temp 98.8°F | Ht 61.0 in | Wt 214.3 lb

## 2012-09-04 DIAGNOSIS — N950 Postmenopausal bleeding: Secondary | ICD-10-CM

## 2012-09-04 DIAGNOSIS — R9389 Abnormal findings on diagnostic imaging of other specified body structures: Secondary | ICD-10-CM | POA: Insufficient documentation

## 2012-09-04 NOTE — Progress Notes (Signed)
Patient ID: Gail Lynch, female   DOB: Dec 29, 1940, 72 y.o.   MRN: [id number] 72 yo AA female presnets with c/o 2 weeks of 'bleeding' in December.  She thinks it was from her vagina but she had severe constipation at the same time.  Denies abd pain or other sx.  No constitutional sx.  The indications for endometrial biopsy were reviewed.   Risks of the biopsy including cramping, bleeding, infection, uterine perforation, inadequate specimen and need for additional procedures  were discussed. The patient states she understands and agrees to undergo procedure today. Consent was signed. Time out was performed. Urine HCG was negative. A sterile speculum was placed in the patient's vagina and the cervix was prepped with Betadine. A single-toothed tenaculum was placed on the anterior lip of the cervix to stabilize it. An attempt was made to introduce a 3 mm pipelle into the endometrial cavity.  The os was stenotic.  After much probing the cervix was sounded to a depth of 4cm, and a minimal amount of tissue was obtained and sent to pathology. The instruments were removed from the patient's vagina. Minimal bleeding from the cervix was noted. The patient tolerated the procedure well. Routine post-procedure instructions were given to the patient. The patient will follow up to review the results and for further management.    Will order sono if endometrial stripe <76mm will f/u.  If greater, will schedule hysteroscopy in OR after giving cytotec.  F/u 4 weeks or sooner prn  Ramaya Guile L. Harraway-Smith, M.D., [facility]

## 2012-09-04 NOTE — Patient Instructions (Signed)
Postmenopausal Bleeding Menopause is commonly referred to as the "change in life." It is a time when the fertile years, the time of ovulating and having menstrual periods, has come to an end. It is also determined by not having menstrual periods for 12 months.  Postmenopausal bleeding is any bleeding a woman has after she has entered into menopause. Any type of postmenopausal bleeding, even if it appears to be a typical menstrual period, is concerning. This should be evaluated by your caregiver.  CAUSES   Hormone therapy.  Cancer of the cervix or cancer of the lining of the uterus (endometrial cancer).  Thinning of the uterine lining (uterine atrophy).  Thyroid diseases.  Certain medicines.  Infection of the uterus or cervix.  Inflammation or irritation of the uterine lining (endometritis).  Estrogen-secreting tumors.  Growths (polyps) on the cervix, uterine lining, or uterus.  Uterine tumors (fibroids).  Being very overweight (obese). DIAGNOSIS  Your caregiver will take a medical history and ask questions. A physical exam will also be performed. Further tests may include:   A transvaginal ultrasound. An ultrasound wand or probe is inserted into your vagina to view the pelvic organs.  A biopsy of the lining of the uterus (endometrium). A sample of the endometrium is removed and examined.  A hysteroscopy. Your caregiver may use an instrument with a light and a camera attached to it (hysteroscope). The hysteroscope is used to look inside the uterus for problems.  A dilation and curettage (D&C). Tissue is removed from the uterine lining to be examined for problems. TREATMENT  Treatment depends on the cause of the bleeding. Some treatments include:   Surgery.  Medicines.  Hormones.  A hysteroscopy or D&C to remove polyps or fibroids.  Changing or stopping a current medicine you are taking. Talk to your caregiver about your specific treatment. HOME CARE INSTRUCTIONS    Maintain a healthy weight.  Keep regular pelvic exams and Pap tests. SEEK MEDICAL CARE IF:   You have bleeding, even if it is light in comparison to your previous periods.  Your bleeding lasts more than 1 week.  You have abdominal pain.  You develop bleeding with sexual intercourse. SEEK IMMEDIATE MEDICAL CARE IF:   You have a fever, chills, headache, dizziness, muscle aches, and bleeding.  You have severe pain with bleeding.  You are passing blood clots.  You have bleeding and need more than 1 pad an hour.  You feel faint. MAKE SURE YOU:  Understand these instructions.  Will watch your condition.  Will get help right away if you are not doing well or get worse. Document Released: 11/15/2005 Document Revised: 10/30/2011 Document Reviewed: 04/13/2011 Exit[employer] Patient Information 2013 [employer].  

## 2012-09-05 ENCOUNTER — Telehealth: Payer: Self-pay | Admitting: Obstetrics & Gynecology

## 2012-09-05 ENCOUNTER — Encounter (HOSPITAL_COMMUNITY): Payer: Self-pay | Admitting: Pharmacist

## 2012-09-05 DIAGNOSIS — N950 Postmenopausal bleeding: Secondary | ICD-10-CM

## 2012-09-05 MED ORDER — MISOPROSTOL 200 MCG PO TABS: 400.0000 ug | ORAL_TABLET | Freq: Once | ORAL | Status: DC

## 2012-09-05 NOTE — Telephone Encounter (Signed)
Telephone call to pt to review results of her sono from yesterday.  She had PMPB.  Unable to complete Endobx in ofc due to stenotic os and pt inability to tolerate procedure.  Endometrium thickened to 2cm.  Rec D&C after cytotec to assist with stenosis.  Reviewed results and recommendation with pt.  She agrees with plan to proceed with hysteroscopy with D&C.  She understands that this is to r/o endometrial ca.  Will schedule Hysteroscopy with D&C Cytotec po 8 hours PRIOR to procedure.

## 2012-09-10 ENCOUNTER — Encounter (HOSPITAL_COMMUNITY)
Admission: RE | Admit: 2012-09-10 | Discharge: 2012-09-10 | Disposition: A | Discharge status: Home / Self Care | Payer: Medicare Other | Source: Ambulatory Visit | Attending: Obstetrics & Gynecology | Admitting: Obstetrics & Gynecology

## 2012-09-10 ENCOUNTER — Encounter (HOSPITAL_COMMUNITY): Payer: Self-pay

## 2012-09-10 DIAGNOSIS — Z01812 Encounter for preprocedural laboratory examination: Secondary | ICD-10-CM | POA: Insufficient documentation

## 2012-09-10 DIAGNOSIS — Z01818 Encounter for other preprocedural examination: Secondary | ICD-10-CM | POA: Insufficient documentation

## 2012-09-10 HISTORY — DX: Nausea with vomiting, unspecified: R11.2

## 2012-09-10 HISTORY — DX: Other specified postprocedural states: Z98.890

## 2012-09-10 LAB — BASIC METABOLIC PANEL
CO2: 27 mEq/L (ref 19–32)
Calcium: 8.9 mg/dL (ref 8.4–10.5)
Chloride: 101 mEq/L (ref 96–112)
Creatinine, Ser: 1.11 mg/dL — ABNORMAL HIGH (ref 0.50–1.10)
Glucose, Bld: 200 mg/dL — ABNORMAL HIGH (ref 70–99)

## 2012-09-10 LAB — CBC
Hemoglobin: 11.1 g/dL — ABNORMAL LOW (ref 12.0–15.0)
MCH: 28.2 pg (ref 26.0–34.0)
MCV: 87.8 fL (ref 78.0–100.0)
RBC: 3.93 MIL/uL (ref 3.87–5.11)
WBC: 7.5 10*3/uL (ref 4.0–10.5)

## 2012-09-10 NOTE — Patient Instructions (Addendum)
   Your procedure is scheduled on:Wednesday January 22nd  Enter through the [employer] of [facility] at:830 am Pick up the phone at the desk and dial [phone] and inform us of your arrival.  Please call this number if you have any problems the morning of surgery: [phone]  Remember: Do not eat or drink anything after midnight on Tuesday Please take your lipitor and diovan hct the evening before surgery as usual Please hold your Levemir morning of surgery.  Do not wear jewelry, make-up, or FINGER nail polish No metal in your hair or on your body. Do not wear lotions, powders, perfumes. You may wear deodorant.  Please use your CHG wash as directed prior to surgery.  Do not shave anywhere for at least 12 hours prior to first CHG shower.  Do not bring valuables to the hospital. Contacts, dentures or bridgework may not be worn into surgery.  Leave suitcase in the car. After Surgery it may be brought to your room. For patients being admitted to the hospital, checkout time is 11:00am the day of discharge.  Patients discharged on the day of surgery will not be allowed to drive home.

## 2012-09-10 NOTE — Pre-Procedure Instructions (Addendum)
Dr Arby Barrette called for guidance with Levemir Insulin-orders to hold 80 units morning of surgery received.  Dr Arby Barrette reviewed pt history -M.O., carotid stenosis, HTN, IDDM, HgbA1C-9.0 in October 2013, EKG with changes-no ekg for comparison-Dr Fayrene Fearing [facility] office called and no EKG's done there. No EKG's in [place] or [facility] system. Medical Records called --no EKG's for this pt.  Upon further questioning of patient-states she saw Dr Margaretmary Bayley prior to Dr Teodoro Spray called-attempting to locate old chart to see if EKG done there. If no EKG in past 2 years to review-Dr Hatchett recommends cancelling surgery tomorrow and stress test need with clearance. Message left with [facility] office-awaiting callback. Dr Raphael Gibney office called a second time to confirm no EKG done there-confirmed. Dr Burna Forts office unable to locate old chart-was last seen there in 2012. Spoke with [place] from [facility]-pt needs to have stress test and clearance Spoke with patient-aware of plan

## 2012-09-11 ENCOUNTER — Encounter (HOSPITAL_COMMUNITY): Admission: RE | Payer: Self-pay | Source: Ambulatory Visit

## 2012-09-11 ENCOUNTER — Ambulatory Visit (HOSPITAL_COMMUNITY)
Admission: RE | Admit: 2012-09-11 | Payer: Medicare Other | Source: Ambulatory Visit | Admitting: Obstetrics & Gynecology

## 2012-09-11 SURGERY — DILATATION AND CURETTAGE /HYSTEROSCOPY
Anesthesia: Choice | Site: Abdomen

## 2012-09-30 ENCOUNTER — Ambulatory Visit: Payer: Medicare Other | Admitting: Endocrinology

## 2012-11-01 ENCOUNTER — Encounter: Payer: Self-pay | Admitting: Endocrinology

## 2012-11-01 ENCOUNTER — Ambulatory Visit (INDEPENDENT_AMBULATORY_CARE_PROVIDER_SITE_OTHER): Payer: Medicare Other | Admitting: Endocrinology

## 2012-11-01 VITALS — BP 128/80 | HR 82 | Wt 213.0 lb

## 2012-11-01 DIAGNOSIS — E1039 Type 1 diabetes mellitus with other diabetic ophthalmic complication: Secondary | ICD-10-CM

## 2012-11-01 DIAGNOSIS — E1065 Type 1 diabetes mellitus with hyperglycemia: Secondary | ICD-10-CM

## 2012-11-01 LAB — HEMOGLOBIN A1C: Hgb A1c MFr Bld: 10.2 % — ABNORMAL HIGH (ref 4.6–6.5)

## 2012-11-01 NOTE — Progress Notes (Signed)
Subjective:    Patient ID: Gail Lynch, female    DOB: 12-15-1940, 72 y.o.   MRN: [id number]  HPI Pt return for f/u of IDDM (dx'ed 1990; complicated by peripheral sensory neuropathy; she has chosen a simple qd insulin regimen; she has never had severe hypoglycemia or DKA).  no cbg record, but states cbg's vary from 70-300.  There is no trend throughout the day.  pt states she feels well in general.  She says her meal schedule is very irregular, and she often misses meals.   Past Medical History  Diagnosis Date  . Diabetes mellitus   . Hypertension   . Hard of hearing   . Dysplasia of cervix, low grade (CIN 1)     s/p cervical conization  . HPV (human papilloma virus) infection   . Asthma   . Glaucoma   . Hyperlipidemia   . Colon polyps   . Intrinsic asthma, unspecified 12/18/2011  . Glaucoma(365) 12/18/2011  . Carotid stenosis 06/18/2012  . PONV (postoperative nausea and vomiting)     Past Surgical History  Procedure Laterality Date  . Breast biopsy    . Knee surgery  2010    patella fracture  . Breast biopsy  1991  . Dilation and curettage of uterus  2000    History   Social History  . Marital Status: Widowed    Spouse Name: N/A    Number of Children: 3  . Years of Education: N/A   Occupational History  . retired    Social History Main Topics  . Smoking status: Former Smoker    Quit date: 08/21/1980  . Smokeless tobacco: Former Neurosurgeon  . Alcohol Use: No  . Drug Use: No  . Sexually Active: Not on file   Other Topics Concern  . Not on file   Social History Narrative  . No narrative on file    Current Outpatient Prescriptions on File Prior to Visit  Medication Sig Dispense Refill  . atorvastatin (LIPITOR) 40 MG tablet Take 40 mg by mouth daily.      . Cholecalciferol (VITAMIN D3) 1000 UNITS CAPS Take 1 capsule by mouth daily.      . fish oil-omega-3 fatty acids 1000 MG capsule Take 2 g by mouth daily.      . Flaxseed, Linseed, (FLAX SEED OIL) 1000 MG CAPS  Take 3 capsules by mouth daily.      . Garlic 1000 MG CAPS Take 1 capsule by mouth daily.      . insulin detemir (LEVEMIR) 100 UNIT/ML injection Inject 80 Units into the skin every morning.       . misoprostol (CYTOTEC) 200 MCG tablet Take 2 tablets (400 mcg total) by mouth once.  2 tablet  0  . Polyethylene Glycol 3350 (MIRALAX PO) Take 17 g by mouth daily as needed.       . pyridoxine ([employer] VITAMIN B-6) 200 MG tablet Take 200 mg by mouth daily.      . valsartan-hydrochlorothiazide (DIOVAN-HCT) 160-12.5 MG per tablet Take 1 tablet by mouth daily.      . vitamin B-12 (CYANOCOBALAMIN) 500 MCG tablet Take 500 mcg by mouth daily.        No current facility-administered medications on file prior to visit.    Allergies  Allergen Reactions  . Aspirin Other (See Comments)    BLEEDING BEHIND EYES  . Penicillins     Family History  Problem Relation Age of Onset  . Diabetes Brother   .  Heart disease Brother   . Heart disease Mother   . Heart disease Father   . Asthma      BP 128/80  Pulse 82  Wt 213 lb (96.616 kg)  BMI 40.27 kg/m2  SpO2 98%   Review of Systems denies hypoglycemia    Objective:   Physical Exam VITAL SIGNS:  See vs page GENERAL: no distress SKIN:  Insulin injection sites at the anterior abdomen are normal   Lab Results  Component Value Date   HGBA1C 10.2* 11/01/2012      Assessment & Plan:  DM, poor control.  We'll continue the same insulin for now, but she'll need a change if this persists.

## 2012-11-01 NOTE — Patient Instructions (Addendum)
check your blood sugar twice a day.  vary the time of day when you check, between before the 3 meals, and at bedtime.  also check if you have symptoms of your blood sugar being too high or too low.  please keep a record of the readings and bring it to your next appointment here.  please call us sooner if your blood sugar goes below 70, or if it stays over 200.   blood tests are being requested for you today.  We'll contact you with results. On this type of insulin schedule, it is not safe to miss meals.   Please come back for a follow-up appointment in 3 months.

## 2012-12-12 ENCOUNTER — Other Ambulatory Visit: Payer: Self-pay | Admitting: Internal Medicine

## 2012-12-12 DIAGNOSIS — Z1231 Encounter for screening mammogram for malignant neoplasm of breast: Secondary | ICD-10-CM

## 2012-12-16 ENCOUNTER — Ambulatory Visit (INDEPENDENT_AMBULATORY_CARE_PROVIDER_SITE_OTHER): Payer: Medicare Other | Admitting: Internal Medicine

## 2012-12-16 ENCOUNTER — Other Ambulatory Visit (INDEPENDENT_AMBULATORY_CARE_PROVIDER_SITE_OTHER): Payer: Medicare Other

## 2012-12-16 ENCOUNTER — Encounter: Payer: Self-pay | Admitting: Internal Medicine

## 2012-12-16 VITALS — BP 138/70 | HR 75 | Temp 98.2°F | Ht 62.0 in | Wt 206.1 lb

## 2012-12-16 DIAGNOSIS — Z0000 Encounter for general adult medical examination without abnormal findings: Secondary | ICD-10-CM

## 2012-12-16 DIAGNOSIS — E1039 Type 1 diabetes mellitus with other diabetic ophthalmic complication: Secondary | ICD-10-CM

## 2012-12-16 LAB — BASIC METABOLIC PANEL
CO2: 29 mEq/L (ref 19–32)
Calcium: 9 mg/dL (ref 8.4–10.5)
Creatinine, Ser: 1.2 mg/dL (ref 0.4–1.2)
GFR: 54.9 mL/min — ABNORMAL LOW (ref >60.00)
Glucose, Bld: 296 mg/dL — ABNORMAL HIGH (ref 70–99)
Sodium: 138 mEq/L (ref 135–145)

## 2012-12-16 LAB — URINALYSIS, ROUTINE W REFLEX MICROSCOPIC
Ketones, ur: NEGATIVE
Specific Gravity, Urine: 1.02 (ref 1.000–1.030)
Total Protein, Urine: NEGATIVE
Urine Glucose: NEGATIVE
Urobilinogen, UA: 0.2 (ref 0.0–1.0)
pH: 6 (ref 5.0–8.0)

## 2012-12-16 LAB — LIPID PANEL
HDL: 36.4 mg/dL — ABNORMAL LOW (ref >39.00)
Total CHOL/HDL Ratio: 3
Triglycerides: 109 mg/dL (ref 0.0–149.0)
VLDL: 21.8 mg/dL (ref 0.0–40.0)

## 2012-12-16 LAB — CBC WITH DIFFERENTIAL/PLATELET
Basophils Absolute: 0 10*3/uL (ref 0.0–0.1)
Basophils Relative: 0.3 % (ref 0.0–3.0)
Eosinophils Relative: 2.1 % (ref 0.0–5.0)
HCT: 34.4 % — ABNORMAL LOW (ref 36.0–46.0)
Hemoglobin: 11.8 g/dL — ABNORMAL LOW (ref 12.0–15.0)
Lymphocytes Relative: 25.5 % (ref 12.0–46.0)
Lymphs Abs: 2.1 10*3/uL (ref 0.7–4.0)
Monocytes Relative: 7.8 % (ref 3.0–12.0)
Neutro Abs: 5.3 10*3/uL (ref 1.4–7.7)
RBC: 4.03 Mil/uL (ref 3.87–5.11)
WBC: 8.2 10*3/uL (ref 4.5–10.5)

## 2012-12-16 LAB — HEPATIC FUNCTION PANEL
Albumin: 3.1 g/dL — ABNORMAL LOW (ref 3.5–5.2)
Alkaline Phosphatase: 83 U/L (ref 39–117)
Total Protein: 6.8 g/dL (ref 6.0–8.3)

## 2012-12-16 LAB — HEMOGLOBIN A1C: Hgb A1c MFr Bld: 10.8 % — ABNORMAL HIGH (ref 4.6–6.5)

## 2012-12-16 NOTE — Assessment & Plan Note (Signed)

## 2012-12-16 NOTE — Progress Notes (Signed)
Subjective:    Patient ID: Gail Lynch, female    DOB: 1941-01-27, 72 y.o.   MRN: [id number]  HPI  Here for wellness and f/u;  Overall doing ok;  Pt denies CP, worsening SOB, DOE, wheezing, orthopnea, PND, worsening LE edema, palpitations, dizziness or syncope.  Pt denies neurological change such as new headache, facial or extremity weakness.  Pt denies polydipsia, polyuria, or low sugar symptoms. Pt states overall good compliance with treatment and medications, good tolerability, and has been trying to follow lower cholesterol diet.  Pt denies worsening depressive symptoms, suicidal ideation or panic. No fever, night sweats, wt loss, loss of appetite, or other constitutional symptoms.  Pt states good ability with ADL's, has low fall risk, home safety reviewed and adequate, no other significant changes in hearing or vision, and only rarely active with exercise. nonsmoker since 67.  Past Medical History  Diagnosis Date  . Diabetes mellitus   . Hypertension   . Hard of hearing   . Dysplasia of cervix, low grade (CIN 1)     s/p cervical conization  . HPV (human papilloma virus) infection   . Asthma   . Glaucoma   . Hyperlipidemia   . Colon polyps   . Intrinsic asthma, unspecified 12/18/2011  . Glaucoma(365) 12/18/2011  . Carotid stenosis 06/18/2012  . PONV (postoperative nausea and vomiting)    Past Surgical History  Procedure Laterality Date  . Breast biopsy    . Knee surgery  2010    patella fracture  . Breast biopsy  1991  . Dilation and curettage of uterus  2000    reports that she quit smoking about 32 years ago. She has quit using smokeless tobacco. She reports that she does not drink alcohol or use illicit drugs. family history includes Asthma in an unspecified family member; Diabetes in her brother; and Heart disease in her brother, father, and mother. Allergies  Allergen Reactions  . Aspirin Other (See Comments)    BLEEDING BEHIND EYES  . Penicillins    Current  Outpatient Prescriptions on File Prior to Visit  Medication Sig Dispense Refill  . atorvastatin (LIPITOR) 40 MG tablet Take 40 mg by mouth daily.      . Cholecalciferol (VITAMIN D3) 1000 UNITS CAPS Take 1 capsule by mouth daily.      . fish oil-omega-3 fatty acids 1000 MG capsule Take 2 g by mouth daily.      . Flaxseed, Linseed, (FLAX SEED OIL) 1000 MG CAPS Take 3 capsules by mouth daily.      . Garlic 1000 MG CAPS Take 1 capsule by mouth daily.      . insulin detemir (LEVEMIR) 100 UNIT/ML injection Inject 80 Units into the skin every morning.       . misoprostol (CYTOTEC) 200 MCG tablet Take 2 tablets (400 mcg total) by mouth once.  2 tablet  0  . Polyethylene Glycol 3350 (MIRALAX PO) Take 17 g by mouth daily as needed.       . pyridoxine ([employer]S VITAMIN B-6) 200 MG tablet Take 200 mg by mouth daily.      . valsartan-hydrochlorothiazide (DIOVAN-HCT) 160-12.5 MG per tablet Take 1 tablet by mouth daily.      . vitamin B-12 (CYANOCOBALAMIN) 500 MCG tablet Take 500 mcg by mouth daily.        No current facility-administered medications on file prior to visit.    Review of Systems Constitutional: Negative for diaphoresis, activity change, appetite change or unexpected weight change.  HENT: Negative for hearing loss, ear pain, facial swelling, mouth sores and neck stiffness.   Eyes: Negative for pain, redness and visual disturbance.  Respiratory: Negative for shortness of breath and wheezing.   Cardiovascular: Negative for chest pain and palpitations.  Gastrointestinal: Negative for diarrhea, blood in stool, abdominal distention or other pain Genitourinary: Negative for hematuria, flank pain or change in urine volume.  Musculoskeletal: Negative for myalgias and joint swelling.  Skin: Negative for color change and wound.  Neurological: Negative for syncope and numbness. other than noted Hematological: Negative for adenopathy.  Psychiatric/Behavioral: Negative for hallucinations, self-injury,  decreased concentration and agitation.      Objective:   Physical Exam BP 138/70  Pulse 75  Temp(Src) 98.2 F (36.8 C) (Oral)  Ht 5\' 2"  (1.575 m)  Wt 206 lb 2 oz (93.498 kg)  BMI 37.69 kg/m2  SpO2 99% VS noted, obese, slow in movements  Constitutional: Pt is oriented to person, place, and time. Appears well-developed and well-nourished.  Head: Normocephalic and atraumatic.  Right Ear: External ear normal.  Left Ear: External ear normal.  Nose: Nose normal.  Mouth/Throat: Oropharynx is clear and moist.  Eyes: Conjunctivae and EOM are normal. Pupils are equal, round, and reactive to light.  Neck: Normal range of motion. Neck supple. No JVD present. No tracheal deviation present.  Cardiovascular: Normal rate, regular rhythm, normal heart sounds and intact distal pulses.   Pulmonary/Chest: Effort normal and breath sounds normal.  Abdominal: Soft. Bowel sounds are normal. There is no tenderness. No HSM  Musculoskeletal: Normal range of motion. Exhibits no edema.  Lymphadenopathy:  Has no cervical adenopathy.  Neurological: Pt is alert and oriented to person, place, and time. Pt has normal reflexes. No cranial nerve deficit. , mild slow cognitition today  But no overt memory dysfunction Skin: Skin is warm and dry. No rash noted.  Psychiatric:  Has ? low mood and affect. Behavior is normal.     Assessment & Plan:

## 2012-12-16 NOTE — Patient Instructions (Addendum)
Please continue all other medications as before, and refills have been done if requested. Please go to the LAB in the Basement (turn left off the elevator) for the tests to be done today You will be contacted by phone if any changes need to be made immediately.  Otherwise, you will receive a letter about your results with an explanation Please keep your appointments with your specialists as you have planned- Dr Everardo All Please continue your efforts at being more active, low cholesterol diet, and weight control. You are otherwise up to date with prevention measures today. Please remember to sign up for My Chart if you have not done so, as this will be important to you in the future with finding out test results, communicating by private email, and scheduling acute appointments online when needed. Please return in 6 months, or sooner if needed

## 2012-12-19 ENCOUNTER — Ambulatory Visit: Payer: Medicare Other | Admitting: Obstetrics & Gynecology

## 2013-01-02 ENCOUNTER — Ambulatory Visit (HOSPITAL_COMMUNITY)
Admission: RE | Admit: 2013-01-02 | Payer: Medicare Other | Source: Ambulatory Visit | Admitting: Obstetrics & Gynecology

## 2013-01-02 ENCOUNTER — Encounter (HOSPITAL_COMMUNITY): Admission: RE | Payer: Self-pay | Source: Ambulatory Visit

## 2013-01-02 SURGERY — DILATATION AND CURETTAGE /HYSTEROSCOPY
Anesthesia: Choice | Site: Vagina

## 2013-01-21 ENCOUNTER — Ambulatory Visit (HOSPITAL_COMMUNITY): Payer: Medicare Other

## 2013-01-22 ENCOUNTER — Ambulatory Visit (HOSPITAL_COMMUNITY)
Admission: RE | Admit: 2013-01-22 | Discharge: 2013-01-22 | Disposition: A | Discharge status: Home / Self Care | Payer: Medicare Other | Source: Ambulatory Visit | Attending: Internal Medicine | Admitting: Internal Medicine

## 2013-01-22 DIAGNOSIS — Z1231 Encounter for screening mammogram for malignant neoplasm of breast: Secondary | ICD-10-CM

## 2013-02-06 ENCOUNTER — Ambulatory Visit (INDEPENDENT_AMBULATORY_CARE_PROVIDER_SITE_OTHER): Payer: Medicare Other | Admitting: Endocrinology

## 2013-02-06 ENCOUNTER — Encounter: Payer: Self-pay | Admitting: Endocrinology

## 2013-02-06 VITALS — BP 134/74 | HR 76 | Ht 62.0 in | Wt 210.0 lb

## 2013-02-06 DIAGNOSIS — E1039 Type 1 diabetes mellitus with other diabetic ophthalmic complication: Secondary | ICD-10-CM

## 2013-02-06 DIAGNOSIS — E1065 Type 1 diabetes mellitus with hyperglycemia: Secondary | ICD-10-CM

## 2013-02-06 MED ORDER — INSULIN DETEMIR 100 UNIT/ML ~~LOC~~ SOLN: 100.0000 [IU] | SUBCUTANEOUS | Status: DC

## 2013-02-06 NOTE — Patient Instructions (Addendum)
check your blood sugar twice a day.  vary the time of day when you check, between before the 3 meals, and at bedtime.  also check if you have symptoms of your blood sugar being too high or too low.  please keep a record of the readings and bring it to your next appointment here.  please call us sooner if your blood sugar goes below 70, or if it stays over 200.   Please increase the levemir to 100 units each morning. On this type of insulin schedule, you should eat meals on a regular schedule.  If a meal is missed or significantly delayed, your blood sugar could go low.  Please come back for a follow-up appointment in 2 months.

## 2013-02-06 NOTE — Progress Notes (Signed)
Subjective:    Patient ID: Gail Lynch, female    DOB: Mar 30, 1941, 72 y.o.   MRN: [id number]  HPI Pt return for f/u of IDDM (dx'ed 1990; she has moderate sensory neuropathy of the lower extremities; she has associated retinopathy; she has chosen a simple qd insulin regimen; she has never had severe hypoglycemia or DKA).  no cbg record, but states cbg's vary from 140-300.  She says she never misses the insulin.  She seldom has hypoglycemia--she says this is from missing meals.   Past Medical History  Diagnosis Date  . Diabetes mellitus   . Hypertension   . Hard of hearing   . Dysplasia of cervix, low grade (CIN 1)     s/p cervical conization  . HPV (human papilloma virus) infection   . Asthma   . Glaucoma   . Hyperlipidemia   . Colon polyps   . Intrinsic asthma, unspecified 12/18/2011  . Glaucoma 12/18/2011  . Carotid stenosis 06/18/2012  . PONV (postoperative nausea and vomiting)     Past Surgical History  Procedure Laterality Date  . Breast biopsy    . Knee surgery  2010    patella fracture  . Breast biopsy  1991  . Dilation and curettage of uterus  2000    History   Social History  . Marital Status: Widowed    Spouse Name: N/A    Number of Children: 3  . Years of Education: N/A   Occupational History  . retired    Social History Main Topics  . Smoking status: Former Smoker    Quit date: 08/21/1980  . Smokeless tobacco: Former Neurosurgeon  . Alcohol Use: No  . Drug Use: No  . Sexually Active: Not on file   Other Topics Concern  . Not on file   Social History Narrative  . No narrative on file    Current Outpatient Prescriptions on File Prior to Visit  Medication Sig Dispense Refill  . atorvastatin (LIPITOR) 40 MG tablet Take 40 mg by mouth daily.      . Cholecalciferol (VITAMIN D3) 1000 UNITS CAPS Take 1 capsule by mouth daily.      . fish oil-omega-3 fatty acids 1000 MG capsule Take 2 g by mouth daily.      . Flaxseed, Linseed, (FLAX SEED OIL) 1000 MG CAPS  Take 3 capsules by mouth daily.      . Garlic 1000 MG CAPS Take 1 capsule by mouth daily.      . misoprostol (CYTOTEC) 200 MCG tablet Take 2 tablets (400 mcg total) by mouth once.  2 tablet  0  . Polyethylene Glycol 3350 (MIRALAX PO) Take 17 g by mouth daily as needed.       . pyridoxine ([employer]S VITAMIN B-6) 200 MG tablet Take 200 mg by mouth daily.      . valsartan-hydrochlorothiazide (DIOVAN-HCT) 160-12.5 MG per tablet Take 1 tablet by mouth daily.      . vitamin B-12 (CYANOCOBALAMIN) 500 MCG tablet Take 500 mcg by mouth daily.        No current facility-administered medications on file prior to visit.    Allergies  Allergen Reactions  . Aspirin Other (See Comments)    BLEEDING BEHIND EYES  . Penicillins    Family History  Problem Relation Age of Onset  . Diabetes Brother   . Heart disease Brother   . Heart disease Mother   . Heart disease Father   . Asthma     BP 134/74  Pulse 76  Ht 5\' 2"  (1.575 m)  Wt 210 lb (95.255 kg)  BMI 38.4 kg/m2  SpO2 98%  Review of Systems Denies LOC and weight change.    Objective:   Physical Exam VITAL SIGNS:  See vs page GENERAL: no distress  Lab Results  Component Value Date   HGBA1C 10.8* 12/16/2012      Assessment & Plan:  DM: This insulin regimen was chosen from multiple options, for its simplicity.  The benefits of glycemic control must be weighed against the risks of hypoglycemia.  Therapy is limited by noncompliance with cbg's.  i'll do the best i can, which is to titrate insulin based on a1c.

## 2013-02-13 ENCOUNTER — Encounter (INDEPENDENT_AMBULATORY_CARE_PROVIDER_SITE_OTHER): Payer: Medicare Other

## 2013-02-13 DIAGNOSIS — I6529 Occlusion and stenosis of unspecified carotid artery: Secondary | ICD-10-CM

## 2013-02-19 DIAGNOSIS — B351 Tinea unguium: Secondary | ICD-10-CM

## 2013-02-19 HISTORY — DX: Tinea unguium: B35.1

## 2013-04-08 ENCOUNTER — Encounter: Payer: Self-pay | Admitting: Endocrinology

## 2013-04-08 ENCOUNTER — Ambulatory Visit (INDEPENDENT_AMBULATORY_CARE_PROVIDER_SITE_OTHER): Payer: Medicare Other | Admitting: Endocrinology

## 2013-04-08 VITALS — BP 128/70 | HR 80 | Ht 64.0 in | Wt 205.0 lb

## 2013-04-08 DIAGNOSIS — E1039 Type 1 diabetes mellitus with other diabetic ophthalmic complication: Secondary | ICD-10-CM

## 2013-04-08 LAB — HEMOGLOBIN A1C: Hgb A1c MFr Bld: 10.6 % — ABNORMAL HIGH (ref 4.6–6.5)

## 2013-04-08 LAB — MICROALBUMIN / CREATININE URINE RATIO: Creatinine,U: 74.6 mg/dL

## 2013-04-08 NOTE — Patient Instructions (Addendum)
check your blood sugar twice a day.  vary the time of day when you check, between before the 3 meals, and at bedtime.  also check if you have symptoms of your blood sugar being too high or too low.  please keep a record of the readings and bring it to your next appointment here.  please call us sooner if your blood sugar goes below 70, or if it stays over 200.   blood and urine tests are being requested for you today.  We'll contact you with results.   On this type of insulin schedule, you should eat meals on a regular schedule.  If a meal is missed or significantly delayed, your blood sugar could go low.  Please come back for a follow-up appointment in 3 months.

## 2013-04-08 NOTE — Progress Notes (Signed)
Subjective:    Patient ID: Gail Lynch, female    DOB: [birth year], 72 y.o.   MRN: [id number]  HPI Pt return for f/u of IDDM (dx'ed 1990; she has moderate sensory neuropathy of the lower extremities; she has associated retinopathy; she has chosen a simple qd insulin regimen; she has never had severe hypoglycemia or DKA).  no cbg record, but states cbg's vary from 70-240.  It is in general higher as the day goes on.  She says she missed the insulin only once since last ov.  She seldom has hypoglycemia--she says this is from missing meals. She has a few months of slight blurry vision from both eyes, but no assoc headache.  She saw opthal for this 4 mos ago.   Past Medical History  Diagnosis Date  . Diabetes mellitus   . Hypertension   . Hard of hearing   . Dysplasia of cervix, low grade (CIN 1)     s/p cervical conization  . HPV (human papilloma virus) infection   . Asthma   . Glaucoma   . Hyperlipidemia   . Colon polyps   . Intrinsic asthma, unspecified 12/18/2011  . Glaucoma 12/18/2011  . Carotid stenosis 06/18/2012  . PONV (postoperative nausea and vomiting)     Past Surgical History  Procedure Laterality Date  . Breast biopsy    . Knee surgery  2010    patella fracture  . Breast biopsy  1991  . Dilation and curettage of uterus  2000    History   Social History  . Marital Status: Widowed    Spouse Name: N/A    Number of Children: 3  . Years of Education: N/A   Occupational History  . retired    Social History Main Topics  . Smoking status: Former Smoker    Quit date: 08/21/1980  . Smokeless tobacco: Former Neurosurgeon  . Alcohol Use: No  . Drug Use: No  . Sexual Activity: Not on file   Other Topics Concern  . Not on file   Social History Narrative  . No narrative on file    Current Outpatient Prescriptions on File Prior to Visit  Medication Sig Dispense Refill  . atorvastatin (LIPITOR) 40 MG tablet Take 40 mg by mouth daily.      . Cholecalciferol (VITAMIN D3)  1000 UNITS CAPS Take 1 capsule by mouth daily.      . fish oil-omega-3 fatty acids 1000 MG capsule Take 2 g by mouth daily.      . Flaxseed, Linseed, (FLAX SEED OIL) 1000 MG CAPS Take 3 capsules by mouth daily.      . Garlic 1000 MG CAPS Take 1 capsule by mouth daily.      . misoprostol (CYTOTEC) 200 MCG tablet Take 2 tablets (400 mcg total) by mouth once.  2 tablet  0  . Polyethylene Glycol 3350 (MIRALAX PO) Take 17 g by mouth daily as needed.       . pyridoxine ([employer] VITAMIN B-6) 200 MG tablet Take 200 mg by mouth daily.      . valsartan-hydrochlorothiazide (DIOVAN-HCT) 160-12.5 MG per tablet Take 1 tablet by mouth daily.      . vitamin B-12 (CYANOCOBALAMIN) 500 MCG tablet Take 500 mcg by mouth daily.        No current facility-administered medications on file prior to visit.    Allergies  Allergen Reactions  . Aspirin Other (See Comments)    BLEEDING BEHIND EYES  . Penicillins  Family History  Problem Relation Age of Onset  . Diabetes Brother   . Heart disease Brother   . Heart disease Mother   . Heart disease Father   . Asthma     BP 128/70  Pulse 80  Ht 5\' 4"  (1.626 m)  Wt 205 lb (92.987 kg)  BMI 35.17 kg/m2  SpO2 98%  Review of Systems denies hypoglycemia and weight change.      Objective:   Physical Exam VITAL SIGNS:  See vs page GENERAL: no distress.   SKIN:  Insulin injection sites at the anterior abdomen are normal.  Lab Results  Component Value Date   HGBA1C 10.6* 04/08/2013      Assessment & Plan:  DM: she needs increased rx.  This insulin regimen was chosen from multiple options, for its simplicity.  The benefits of glycemic control must be weighed against the risks of hypoglycemia.  She will need a shorter acting insulin to accomplish this. Blurry vision, prob due to DM

## 2013-04-09 MED ORDER — INSULIN NPH (HUMAN) (ISOPHANE) 100 UNIT/ML ~~LOC~~ SUSP: 80.0000 [IU] | SUBCUTANEOUS | Status: DC

## 2013-05-05 ENCOUNTER — Encounter: Payer: Self-pay | Admitting: Podiatrist

## 2013-05-05 DIAGNOSIS — B351 Tinea unguium: Secondary | ICD-10-CM | POA: Insufficient documentation

## 2013-05-21 ENCOUNTER — Ambulatory Visit (INDEPENDENT_AMBULATORY_CARE_PROVIDER_SITE_OTHER): Payer: Medicare Other | Admitting: Podiatrist

## 2013-05-21 ENCOUNTER — Encounter: Payer: Self-pay | Admitting: Podiatrist

## 2013-05-21 ENCOUNTER — Ambulatory Visit: Payer: Self-pay | Admitting: Podiatrist

## 2013-05-21 DIAGNOSIS — B351 Tinea unguium: Secondary | ICD-10-CM | POA: Insufficient documentation

## 2013-05-21 DIAGNOSIS — M79609 Pain in unspecified limb: Secondary | ICD-10-CM

## 2013-05-21 NOTE — Patient Instructions (Addendum)
Follow up in 3 months- call if any problems arise prior to that visit

## 2013-05-21 NOTE — Progress Notes (Signed)
HPI:  Patient presents today for follow up of foot and nail care. Denies any new complaints today.  Objective:  Patients chart is reviewed.  Neurovascular status unchanged.  Patients nails are thickened, discolored, distrophic, friable and brittle with yellow-brown discoloration. Patient subjectively relates they are painful with shoes and with ambulation in bilateral feet  Assessment:  Symptomatic onychomycosis  Plan:  Discussed treatment options and alternatives.  The symptomatic toenails were debrided through manual an mechanical means without complication.  Return appointment recommended at routine intervals of 3 months

## 2013-06-17 ENCOUNTER — Encounter: Payer: Self-pay | Admitting: Internal Medicine

## 2013-06-17 ENCOUNTER — Ambulatory Visit (INDEPENDENT_AMBULATORY_CARE_PROVIDER_SITE_OTHER): Payer: Medicare Other | Admitting: Internal Medicine

## 2013-06-17 VITALS — BP 114/70 | HR 63 | Temp 97.8°F | Ht 63.0 in | Wt 211.1 lb

## 2013-06-17 DIAGNOSIS — M5430 Sciatica, unspecified side: Secondary | ICD-10-CM | POA: Insufficient documentation

## 2013-06-17 DIAGNOSIS — M5432 Sciatica, left side: Secondary | ICD-10-CM

## 2013-06-17 DIAGNOSIS — I10 Essential (primary) hypertension: Secondary | ICD-10-CM

## 2013-06-17 DIAGNOSIS — Z23 Encounter for immunization: Secondary | ICD-10-CM

## 2013-06-17 DIAGNOSIS — E785 Hyperlipidemia, unspecified: Secondary | ICD-10-CM

## 2013-06-17 DIAGNOSIS — E1039 Type 1 diabetes mellitus with other diabetic ophthalmic complication: Secondary | ICD-10-CM

## 2013-06-17 NOTE — Addendum Note (Signed)
Addended by: Scharlene Gloss B on: 06/17/2013 10:58 AM   Modules accepted: Orders

## 2013-06-17 NOTE — Patient Instructions (Addendum)
You had the new Prevnar pneumonia shot today Please continue all other medications as before, and refills have been done if requested. Please have the pharmacy call with any other refills you may need.  I think we can hold off on further lab work today  Please take all your medication as prescribed Please keep your appointments with your specialists as you have planned - Dr Everardo All  Please return in 6 months, or sooner if needed

## 2013-06-17 NOTE — Assessment & Plan Note (Signed)
Mild last wk, possibly now improved, exam ok, to continue all other medications as before,  to f/u any worsening symptoms or concerns

## 2013-06-17 NOTE — Progress Notes (Signed)
Subjective:    Patient ID: Gail Lynch, female    DOB: 1941/03/25, 72 y.o.   MRN: [id number]  HPI  Here to f/u; overall doing ok,  Pt denies chest pain, increased sob or doe, wheezing, orthopnea, PND, increased LE swelling, palpitations, dizziness or syncope.  Pt denies polydipsia, polyuria, or low sugar symptoms such as weakness or confusion improved with po intake.  Pt denies new neurological symptoms such as new headache, or facial or extremity weakness or numbness.   Pt states overall good compliance with meds, has been trying to follow lower cholesterol, diabetic diet, with wt overall stable,  but little exercise however. Seeing Dr Glenna Fellows forMD, with recent change in insulin, states cbg's much improved.  Pt continues to have 1 wk intemittent left LBP without change in severity, mild, no bowel or bladder change, fever, wt loss,  worsening LE pain/numbness/weakness, gait change or falls. Past Medical History  Diagnosis Date  . Diabetes mellitus   . Hypertension   . Hard of hearing   . Dysplasia of cervix, low grade (CIN 1)     s/p cervical conization  . HPV (human papilloma virus) infection   . Asthma   . Glaucoma   . Hyperlipidemia   . Colon polyps   . Intrinsic asthma, unspecified 12/18/2011  . Glaucoma 12/18/2011  . Carotid stenosis 06/18/2012  . PONV (postoperative nausea and vomiting)   . Mycotic toenails 02/19/2013   Past Surgical History  Procedure Laterality Date  . Breast biopsy    . Knee surgery  2010    patella fracture  . Breast biopsy  1991  . Dilation and curettage of uterus  2000    reports that she quit smoking about 32 years ago. She has quit using smokeless tobacco. She reports that she does not drink alcohol or use illicit drugs. family history includes Asthma in an other family member; Diabetes in her brother; Heart disease in her brother, father, and mother. Allergies  Allergen Reactions  . Aspirin Other (See Comments)    BLEEDING BEHIND EYES  .  Penicillins    Current Outpatient Prescriptions on File Prior to Visit  Medication Sig Dispense Refill  . atorvastatin (LIPITOR) 40 MG tablet Take 40 mg by mouth daily.      . Cholecalciferol (VITAMIN D3) 1000 UNITS CAPS Take 1 capsule by mouth daily.      . fish oil-omega-3 fatty acids 1000 MG capsule Take 2 g by mouth daily.      . Flaxseed, Linseed, (FLAX SEED OIL) 1000 MG CAPS Take 3 capsules by mouth daily.      . Garlic 1000 MG CAPS Take 1 capsule by mouth daily.      . insulin NPH (NOVOLIN N) 100 UNIT/ML injection Inject 80 Units into the skin every morning. And syringes 1/day  3 vial  12  . misoprostol (CYTOTEC) 200 MCG tablet Take 2 tablets (400 mcg total) by mouth once.  2 tablet  0  . Polyethylene Glycol 3350 (MIRALAX PO) Take 17 g by mouth daily as needed.       . pyridoxine ([employer]S VITAMIN B-6) 200 MG tablet Take 200 mg by mouth daily.      . valsartan-hydrochlorothiazide (DIOVAN-HCT) 160-12.5 MG per tablet Take 1 tablet by mouth daily.      . vitamin B-12 (CYANOCOBALAMIN) 500 MCG tablet Take 500 mcg by mouth daily.        No current facility-administered medications on file prior to visit.   Review of Systems  Constitutional: Negative for unexpected weight change, or unusual diaphoresis  HENT: Negative for tinnitus.   Eyes: Negative for photophobia and visual disturbance.  Respiratory: Negative for choking and stridor.   Gastrointestinal: Negative for vomiting and blood in stool.  Genitourinary: Negative for hematuria and decreased urine volume.  Musculoskeletal: Negative for acute joint swelling Skin: Negative for color change and wound.  Neurological: Negative for tremors and numbness other than noted  Psychiatric/Behavioral: Negative for decreased concentration or  hyperactivity.       Objective:   Physical Exam BP 114/70  Pulse 63  Temp(Src) 97.8 F (36.6 C) (Oral)  Ht 5\' 3"  (1.6 m)  Wt 211 lb 2 oz (95.766 kg)  BMI 37.41 kg/m2  SpO2 97% VS noted,   Constitutional: Pt appears well-developed and well-nourished.  HENT: Head: NCAT.  Right Ear: External ear normal.  Left Ear: External ear normal.  Eyes: Conjunctivae and EOM are normal. Pupils are equal, round, and reactive to light.  Neck: Normal range of motion. Neck supple.  Cardiovascular: Normal rate and regular rhythm.   Pulmonary/Chest: Effort normal and breath sounds normal.  Abd:  Soft, NT, non-distended, + BS Spine: nontender Neurological: Pt is alert. Not confused., motor 5/5, sens/dtr intact, gait intact  Skin: Skin is warm. No erythema.  Psychiatric: Pt behavior is normal. Thought content normal.       Assessment & Plan:

## 2013-06-17 NOTE — Assessment & Plan Note (Signed)
.  stable overall by history and exam, recent data reviewed with pt, and pt to continue medical treatment as before,  to f/u any worsening symptoms or concerns BP Readings from Last 3 Encounters:  06/17/13 114/70  04/08/13 128/70  02/06/13 134/74

## 2013-06-17 NOTE — Assessment & Plan Note (Signed)
stable overall by history and exam, recent data reviewed with pt, and pt to continue medical treatment as before,  to f/u any worsening symptoms or concerns Lab Results  Component Value Date   LDLCALC 67 12/16/2012

## 2013-06-17 NOTE — Assessment & Plan Note (Signed)
Recent a1c veyr high with recent insulin change per endo, to cont f/u with endo

## 2013-07-09 ENCOUNTER — Ambulatory Visit (INDEPENDENT_AMBULATORY_CARE_PROVIDER_SITE_OTHER): Payer: Medicare Other | Admitting: Endocrinology

## 2013-07-09 ENCOUNTER — Encounter: Payer: Self-pay | Admitting: Endocrinology

## 2013-07-09 VITALS — BP 130/68 | HR 72 | Temp 98.3°F | Resp 16 | Ht 62.0 in | Wt 215.2 lb

## 2013-07-09 DIAGNOSIS — E1039 Type 1 diabetes mellitus with other diabetic ophthalmic complication: Secondary | ICD-10-CM

## 2013-07-09 MED ORDER — GLUCOSE BLOOD VI STRP: 1.0000 | ORAL_STRIP | Freq: Two times a day (BID) | Status: DC

## 2013-07-09 NOTE — Patient Instructions (Addendum)
check your blood sugar twice a day.  vary the time of day when you check, between before the 3 meals, and at bedtime.  also check if you have symptoms of your blood sugar being too high or too low.  please keep a record of the readings and bring it to your next appointment here.  please call us sooner if your blood sugar goes below 70, or if it stays over 200.   A diabetes blood test is requested for you today.  We'll contact you with results.    On this type of insulin schedule, you should eat meals on a regular schedule.  If a meal is missed or significantly delayed, your blood sugar could go low.  Please come back for a follow-up appointment in 3 months.    

## 2013-07-09 NOTE — Progress Notes (Signed)
Subjective:    Patient ID: Gail Lynch, female    DOB: 24-Nov-1940, 72 y.o.   MRN: [id number]  HPI Pt return for f/u of IDDM (dx'ed 1990; she has moderate sensory neuropathy of the lower extremities; she has associated retinopathy; she has chosen a simple qd insulin regimen; she has never had severe hypoglycemia or DKA).  she brings a record of her cbg's which i have reviewed today.  It varies from 78-300, but most are approx 100.  There is no trend throughout the day.  pt states she feels well in general.   Past Medical History  Diagnosis Date  . Diabetes mellitus   . Hypertension   . Hard of hearing   . Dysplasia of cervix, low grade (CIN 1)     s/p cervical conization  . HPV (human papilloma virus) infection   . Asthma   . Glaucoma   . Hyperlipidemia   . Colon polyps   . Intrinsic asthma, unspecified 12/18/2011  . Glaucoma 12/18/2011  . Carotid stenosis 06/18/2012  . PONV (postoperative nausea and vomiting)   . Mycotic toenails 02/19/2013    Past Surgical History  Procedure Laterality Date  . Breast biopsy    . Knee surgery  2010    patella fracture  . Breast biopsy  1991  . Dilation and curettage of uterus  2000    History   Social History  . Marital Status: Widowed    Spouse Name: N/A    Number of Children: 3  . Years of Education: N/A   Occupational History  . retired    Social History Main Topics  . Smoking status: Former Smoker    Quit date: 08/21/1980  . Smokeless tobacco: Former Neurosurgeon  . Alcohol Use: No  . Drug Use: No  . Sexual Activity: Not on file   Other Topics Concern  . Not on file   Social History Narrative  . No narrative on file    Current Outpatient Prescriptions on File Prior to Visit  Medication Sig Dispense Refill  . atorvastatin (LIPITOR) 40 MG tablet Take 40 mg by mouth daily.      . Cholecalciferol (VITAMIN D3) 1000 UNITS CAPS Take 1 capsule by mouth daily.      . fish oil-omega-3 fatty acids 1000 MG capsule Take 2 g by mouth  daily.      . Flaxseed, Linseed, (FLAX SEED OIL) 1000 MG CAPS Take 3 capsules by mouth daily.      . Garlic 1000 MG CAPS Take 1 capsule by mouth daily.      . misoprostol (CYTOTEC) 200 MCG tablet Take 2 tablets (400 mcg total) by mouth once.  2 tablet  0  . Polyethylene Glycol 3350 (MIRALAX PO) Take 17 g by mouth daily as needed.       . pyridoxine ([employer]S VITAMIN B-6) 200 MG tablet Take 200 mg by mouth daily.      . valsartan-hydrochlorothiazide (DIOVAN-HCT) 160-12.5 MG per tablet Take 1 tablet by mouth daily.      . vitamin B-12 (CYANOCOBALAMIN) 500 MCG tablet Take 500 mcg by mouth daily.        No current facility-administered medications on file prior to visit.    Allergies  Allergen Reactions  . Aspirin Other (See Comments)    BLEEDING BEHIND EYES  . Penicillins     Family History  Problem Relation Age of Onset  . Diabetes Brother   . Heart disease Brother   . Heart disease Mother   .  Heart disease Father   . Asthma     BP 130/68  Pulse 72  Temp(Src) 98.3 F (36.8 C) (Oral)  Resp 16  Ht 5\' 2"  (1.575 m)  Wt 215 lb 3.2 oz (97.614 kg)  BMI 39.35 kg/m2  Review of Systems denies hypoglycemia and weight change     Objective:   Physical Exam VITAL SIGNS:  See vs page GENERAL: no distress  Lab Results  Component Value Date   HGBA1C 9.2* 07/09/2013       Assessment & Plan:  DM: she needs increased rx.  This insulin regimen was chosen from multiple options, for its simplicity.  The benefits of glycemic control must be weighed against the risks of hypoglycemia.  She needs increased rx. Atherosclerosis: in this setting, she should avoid hypoglycemia.

## 2013-07-18 ENCOUNTER — Other Ambulatory Visit: Payer: Self-pay | Admitting: Internal Medicine

## 2013-07-21 ENCOUNTER — Telehealth: Payer: Self-pay | Admitting: *Deleted

## 2013-07-21 MED ORDER — ALLOPURINOL 300 MG PO TABS: 150.0000 mg | ORAL_TABLET | Freq: Every day | ORAL | Status: DC

## 2013-07-21 NOTE — Telephone Encounter (Signed)
Pt called requesting Allopurinol refill.  Pt has not taking medication in over a year.  Last OV was 10.2.8.14.  Please advise

## 2013-07-21 NOTE — Telephone Encounter (Signed)
Done per emr 

## 2013-07-22 NOTE — Telephone Encounter (Signed)
Spoke with pt advised Rx sent 

## 2013-07-23 ENCOUNTER — Other Ambulatory Visit: Payer: Self-pay | Admitting: Internal Medicine

## 2013-07-25 ENCOUNTER — Telehealth: Payer: Self-pay

## 2013-07-25 NOTE — Telephone Encounter (Signed)
Don't worry.  No need to return

## 2013-07-25 NOTE — Telephone Encounter (Signed)
Patient called and wanted Dr to know that she received a letter from her diabetic supply company stating they would be sending her a Glucometer and testing strips. Patient will return meter upon next visit.

## 2013-07-28 NOTE — Telephone Encounter (Signed)
Patient informed. 

## 2013-08-27 ENCOUNTER — Ambulatory Visit: Payer: Medicare Other | Admitting: Podiatrist

## 2013-09-08 ENCOUNTER — Encounter: Payer: Self-pay | Admitting: Internal Medicine

## 2013-09-08 ENCOUNTER — Ambulatory Visit (INDEPENDENT_AMBULATORY_CARE_PROVIDER_SITE_OTHER): Payer: Medicare Other | Admitting: Internal Medicine

## 2013-09-08 ENCOUNTER — Other Ambulatory Visit (INDEPENDENT_AMBULATORY_CARE_PROVIDER_SITE_OTHER): Payer: Medicare Other

## 2013-09-08 VITALS — BP 150/80 | HR 76 | Temp 99.0°F | Resp 16 | Wt 211.0 lb

## 2013-09-08 DIAGNOSIS — N939 Abnormal uterine and vaginal bleeding, unspecified: Secondary | ICD-10-CM

## 2013-09-08 DIAGNOSIS — I10 Essential (primary) hypertension: Secondary | ICD-10-CM

## 2013-09-08 DIAGNOSIS — N898 Other specified noninflammatory disorders of vagina: Secondary | ICD-10-CM

## 2013-09-08 DIAGNOSIS — E1065 Type 1 diabetes mellitus with hyperglycemia: Secondary | ICD-10-CM

## 2013-09-08 DIAGNOSIS — E1039 Type 1 diabetes mellitus with other diabetic ophthalmic complication: Secondary | ICD-10-CM

## 2013-09-08 LAB — CBC WITH DIFFERENTIAL/PLATELET
BASOS PCT: 0.7 % (ref 0.0–3.0)
Basophils Absolute: 0 10*3/uL (ref 0.0–0.1)
EOS PCT: 1.8 % (ref 0.0–5.0)
Eosinophils Absolute: 0.1 10*3/uL (ref 0.0–0.7)
HCT: 36.1 % (ref 36.0–46.0)
HEMOGLOBIN: 11.6 g/dL — AB (ref 12.0–15.0)
LYMPHS ABS: 2 10*3/uL (ref 0.7–4.0)
Lymphocytes Relative: 28 % (ref 12.0–46.0)
MCHC: 32.2 g/dL (ref 30.0–36.0)
MCV: 85.9 fl (ref 78.0–100.0)
MONOS PCT: 8.7 % (ref 3.0–12.0)
Monocytes Absolute: 0.6 10*3/uL (ref 0.1–1.0)
Neutro Abs: 4.4 10*3/uL (ref 1.4–7.7)
Neutrophils Relative %: 60.8 % (ref 43.0–77.0)
Platelets: 353 10*3/uL (ref 150.0–400.0)
RBC: 4.21 Mil/uL (ref 3.87–5.11)
RDW: 15 % — ABNORMAL HIGH (ref 11.5–14.6)
WBC: 7.2 10*3/uL (ref 4.5–10.5)

## 2013-09-08 LAB — BASIC METABOLIC PANEL
BUN: 14 mg/dL (ref 6–23)
CALCIUM: 9.2 mg/dL (ref 8.4–10.5)
CO2: 28 meq/L (ref 19–32)
Chloride: 103 mEq/L (ref 96–112)
Creatinine, Ser: 1.2 mg/dL (ref 0.4–1.2)
GFR: 58.05 mL/min — ABNORMAL LOW (ref >60.00)
GLUCOSE: 148 mg/dL — AB (ref 70–99)
Potassium: 4 mEq/L (ref 3.5–5.1)
SODIUM: 140 meq/L (ref 135–145)

## 2013-09-08 LAB — HEPATIC FUNCTION PANEL
ALK PHOS: 143 U/L — AB (ref 39–117)
ALT: 24 U/L (ref 0–35)
AST: 20 U/L (ref 0–37)
Albumin: 3.2 g/dL — ABNORMAL LOW (ref 3.5–5.2)
BILIRUBIN DIRECT: 0.1 mg/dL (ref 0.0–0.3)
Total Bilirubin: 0.4 mg/dL (ref 0.3–1.2)
Total Protein: 7.4 g/dL (ref 6.0–8.3)

## 2013-09-08 LAB — PROTIME-INR
INR: 1.1 ratio — AB (ref 0.8–1.0)
Prothrombin Time: 12 s (ref 10.2–12.4)

## 2013-09-08 NOTE — Progress Notes (Signed)
   Subjective:    Patient ID: Gail Lynch, female    DOB: [birth year], 73 y.o.   MRN: [id number]  Vaginal Bleeding This is a new problem. The current episode started in the past 7 days. The problem occurs 2 to 4 times per day. The problem has been waxing and waning. The patient is experiencing no pain. She is not pregnant. Pertinent negatives include no diarrhea, dysuria, fever, flank pain, hematuria, nausea or urgency. She has tried nothing for the symptoms.  She is a vague historian...    Review of Systems  Constitutional: Negative for fever.  Gastrointestinal: Negative for nausea and diarrhea.  Genitourinary: Positive for vaginal bleeding. Negative for dysuria, urgency, hematuria and flank pain.  Neurological: Negative for tremors and weakness.       Objective:   Physical Exam  Constitutional: She appears well-developed. No distress.  HENT:  Head: Normocephalic.  Right Ear: External ear normal.  Left Ear: External ear normal.  Nose: Nose normal.  Mouth/Throat: Oropharynx is clear and moist.  Eyes: Conjunctivae are normal. Pupils are equal, round, and reactive to light. Right eye exhibits no discharge. Left eye exhibits no discharge.  Neck: Normal range of motion. Neck supple. No JVD present. No tracheal deviation present. No thyromegaly present.  Cardiovascular: Normal rate, regular rhythm and normal heart sounds.   Pulmonary/Chest: No stridor. No respiratory distress. She has no wheezes.  Abdominal: Soft. Bowel sounds are normal. She exhibits no distension and no mass. There is no tenderness. There is no rebound and no guarding.  Genitourinary: Vaginal discharge (bleeding) found.  Musculoskeletal: She exhibits no edema and no tenderness.  Lymphadenopathy:    She has no cervical adenopathy.  Neurological: She displays normal reflexes. No cranial nerve deficit. She exhibits normal muscle tone. Coordination normal.  Skin: No rash noted. No erythema.  Psychiatric: She has a  normal mood and affect. Her behavior is normal. Judgment and thought content normal.          Assessment & Plan:

## 2013-09-08 NOTE — Assessment & Plan Note (Signed)
2000, 1/14,1/15 - recurrent  Dr Raphael Gibney, Dr Ihor Dow  Labs GYN f/u

## 2013-09-08 NOTE — Assessment & Plan Note (Signed)
F/u w/Dr Loanne Drilling -- IDDM Continue with current prescription therapy as reflected on the Med list.

## 2013-09-08 NOTE — Progress Notes (Signed)
Pre visit review using our clinic review tool, if applicable. No additional management support is needed unless otherwise documented below in the visit note. 

## 2013-09-08 NOTE — Assessment & Plan Note (Signed)
NAS diet Continue with current prescription therapy as reflected on the Med list.

## 2013-09-10 ENCOUNTER — Encounter: Payer: Self-pay | Admitting: Obstetrics and Gynecology

## 2013-09-10 ENCOUNTER — Ambulatory Visit: Payer: Medicare Other | Admitting: Internal Medicine

## 2013-09-11 ENCOUNTER — Ambulatory Visit (INDEPENDENT_AMBULATORY_CARE_PROVIDER_SITE_OTHER): Payer: Medicare Other | Admitting: Podiatrist

## 2013-09-11 ENCOUNTER — Encounter: Payer: Self-pay | Admitting: Podiatrist

## 2013-09-11 VITALS — BP 143/61 | HR 76 | Resp 17 | Ht 63.0 in | Wt 211.0 lb

## 2013-09-11 DIAGNOSIS — M79676 Pain in unspecified toe(s): Principal | ICD-10-CM

## 2013-09-11 DIAGNOSIS — B351 Tinea unguium: Secondary | ICD-10-CM

## 2013-09-11 DIAGNOSIS — M79609 Pain in unspecified limb: Secondary | ICD-10-CM

## 2013-09-11 NOTE — Patient Instructions (Signed)
Diabetes and Foot Care Diabetes may cause you to have problems because of poor blood supply (circulation) to your feet and legs. This may cause the skin on your feet to become thinner, break easier, and heal more slowly. Your skin may become dry, and the skin may peel and crack. You may also have nerve damage in your legs and feet causing decreased feeling in them. You may not notice minor injuries to your feet that could lead to infections or more serious problems. Taking care of your feet is one of the most important things you can do for yourself.  HOME CARE INSTRUCTIONS  Wear shoes at all times, even in the house. Do not go barefoot. Bare feet are easily injured.  Check your feet daily for blisters, cuts, and redness. If you cannot see the bottom of your feet, use a mirror or ask someone for help.  Wash your feet with warm water (do not use hot water) and mild soap. Then pat your feet and the areas between your toes until they are completely dry. Do not soak your feet as this can dry your skin.  Apply a moisturizing lotion or petroleum jelly (that does not contain alcohol and is unscented) to the skin on your feet and to dry, brittle toenails. Do not apply lotion between your toes.  Trim your toenails straight across. Do not dig under them or around the cuticle. File the edges of your nails with an emery board or nail file.  Do not cut corns or calluses or try to remove them with medicine.  Wear clean socks or stockings every day. Make sure they are not too tight. Do not wear knee-high stockings since they may decrease blood flow to your legs.  Wear shoes that fit properly and have enough cushioning. To break in new shoes, wear them for just a few hours a day. This prevents you from injuring your feet. Always look in your shoes before you put them on to be sure there are no objects inside.  Do not cross your legs. This may decrease the blood flow to your feet.  If you find a minor scrape,  cut, or break in the skin on your feet, keep it and the skin around it clean and dry. These areas may be cleansed with mild soap and water. Do not cleanse the area with peroxide, alcohol, or iodine.  When you remove an adhesive bandage, be sure not to damage the skin around it.  If you have a wound, look at it several times a day to make sure it is healing.  Do not use heating pads or hot water bottles. They may burn your skin. If you have lost feeling in your feet or legs, you may not know it is happening until it is too late.  Make sure your health care provider performs a complete foot exam at least annually or more often if you have foot problems. Report any cuts, sores, or bruises to your health care provider immediately. SEEK MEDICAL CARE IF:   You have an injury that is not healing.  You have cuts or breaks in the skin.  You have an ingrown nail.  You notice redness on your legs or feet.  You feel burning or tingling in your legs or feet.  You have pain or cramps in your legs and feet.  Your legs or feet are numb.  Your feet always feel cold. SEEK IMMEDIATE MEDICAL CARE IF:   There is increasing redness,   swelling, or pain in or around a wound.  There is a red line that goes up your leg.  Pus is coming from a wound.  You develop a fever or as directed by your health care provider.  You notice a bad smell coming from an ulcer or wound. Document Released: 08/04/2000 Document Revised: 04/09/2013 Document Reviewed: 01/14/2013 [employer] Patient Information 2014 [employer]  

## 2013-09-11 NOTE — Progress Notes (Signed)
Pt presents for debridement of B/L 1-5 toenails. HPI:  Patient presents today for follow up of foot and nail care. Denies any new complaints today.  Objective:  Patients chart is reviewed.  Neurovascular status unchanged.  Patients nails are thickened, discolored, distrophic, friable and brittle with yellow-brown discoloration. Patient subjectively relates they are painful with shoes and with ambulation of bilateral feet.  Assessment:  Symptomatic onychomycosis  Plan:  Discussed treatment options and alternatives.  The symptomatic toenails were debrided through manual an mechanical means without complication.  Return appointment recommended at routine intervals of 3 months    Trudie Buckler, DPM

## 2013-10-08 ENCOUNTER — Ambulatory Visit: Payer: Medicare Other | Admitting: Internal Medicine

## 2013-10-09 ENCOUNTER — Encounter: Payer: Medicare Other | Admitting: Obstetrics and Gynecology

## 2013-10-09 ENCOUNTER — Ambulatory Visit: Payer: Medicare Other | Admitting: Endocrinology

## 2013-10-13 ENCOUNTER — Encounter: Payer: Self-pay | Admitting: Endocrinology

## 2013-10-13 ENCOUNTER — Ambulatory Visit (INDEPENDENT_AMBULATORY_CARE_PROVIDER_SITE_OTHER): Payer: Medicare Other | Admitting: Endocrinology

## 2013-10-13 VITALS — BP 112/70 | HR 91 | Temp 98.0°F | Ht 63.0 in | Wt 217.0 lb

## 2013-10-13 DIAGNOSIS — E1039 Type 1 diabetes mellitus with other diabetic ophthalmic complication: Secondary | ICD-10-CM

## 2013-10-13 DIAGNOSIS — E1065 Type 1 diabetes mellitus with hyperglycemia: Principal | ICD-10-CM

## 2013-10-13 LAB — HEMOGLOBIN A1C: Hgb A1c MFr Bld: 9.3 % — ABNORMAL HIGH (ref 4.6–6.5)

## 2013-10-13 NOTE — Progress Notes (Signed)
Subjective:    Patient ID: Gail Lynch, female    DOB: [birth year], 73 y.o.   MRN: [id number]  HPI Pt return for f/u of IDDM (dx'ed 1990, when she presented with burry vision; she has moderate sensory neuropathy of the lower extremities, and associated retinopathy; she has chosen a simple qd insulin regimen; she has never had severe hypoglycemia or DKA).  pt states she feels well in general.  She only takes 80 units qam.  she brings a record of her cbg's which i have reviewed today.  It varies from 59 (afternoon) to mid-100's (other times of day).  The day it was 59, her evening meal was delayed.   Past Medical History  Diagnosis Date  . Diabetes mellitus   . Hypertension   . Hard of hearing   . Dysplasia of cervix, low grade (CIN 1)     s/p cervical conization  . HPV (human papilloma virus) infection   . Asthma   . Glaucoma   . Hyperlipidemia   . Colon polyps   . Intrinsic asthma, unspecified 12/18/2011  . Glaucoma 12/18/2011  . Carotid stenosis 06/18/2012  . PONV (postoperative nausea and vomiting)   . Mycotic toenails 02/19/2013    Past Surgical History  Procedure Laterality Date  . Breast biopsy    . Knee surgery  2010    patella fracture  . Breast biopsy  1991  . Dilation and curettage of uterus  2000    History   Social History  . Marital Status: Widowed    Spouse Name: N/A    Number of Children: 3  . Years of Education: N/A   Occupational History  . retired    Social History Main Topics  . Smoking status: Former Smoker    Quit date: 08/21/1980  . Smokeless tobacco: Former Systems developer  . Alcohol Use: No  . Drug Use: No  . Sexual Activity: Not on file   Other Topics Concern  . Not on file   Social History Narrative  . No narrative on file    Current Outpatient Prescriptions on File Prior to Visit  Medication Sig Dispense Refill  . allopurinol (ZYLOPRIM) 300 MG tablet Take 0.5 tablets (150 mg total) by mouth daily.  45 tablet  3  . atorvastatin (LIPITOR) 40  MG tablet Take 40 mg by mouth daily.      . Cholecalciferol (VITAMIN D3) 1000 UNITS CAPS Take 1 capsule by mouth daily.      . fish oil-omega-3 fatty acids 1000 MG capsule Take 2 g by mouth daily.      . Flaxseed, Linseed, (FLAX SEED OIL) 1000 MG CAPS Take 3 capsules by mouth daily.      . Garlic 2683 MG CAPS Take 1 capsule by mouth daily.      Marland Kitchen glucose blood (ONE TOUCH ULTRA TEST) test strip 1 each by Other route 2 (two) times daily. And lancets 2/day 250.01  180 each  3  . insulin NPH (HUMULIN N,NOVOLIN N) 100 UNIT/ML injection 80 units each morning, and 10 units each evening, and syringes 2/day      . misoprostol (CYTOTEC) 200 MCG tablet Take 2 tablets (400 mcg total) by mouth once.  2 tablet  0  . Polyethylene Glycol 3350 (MIRALAX PO) Take 17 g by mouth daily as needed.       . pyridoxine ([employer]S VITAMIN B-6) 200 MG tablet Take 200 mg by mouth daily.      . valsartan-hydrochlorothiazide (DIOVAN-HCT) 160-12.5 MG  per tablet TAKE ONE TABLET BY MOUTH EVERY DAY  90 tablet  3  . vitamin B-12 (CYANOCOBALAMIN) 500 MCG tablet Take 500 mcg by mouth daily.       Marland Kitchen atorvastatin (LIPITOR) 40 MG tablet TAKE ONE TABLET BY MOUTH EVERY DAY  90 tablet  3   No current facility-administered medications on file prior to visit.    Allergies  Allergen Reactions  . Aspirin Other (See Comments)    BLEEDING BEHIND EYES  . Penicillins     Family History  Problem Relation Age of Onset  . Diabetes Brother   . Heart disease Brother   . Heart disease Mother   . Heart disease Father   . Asthma     BP 112/70  Pulse 91  Temp(Src) 98 F (36.7 C) (Oral)  Ht 5\' 3"  (1.6 m)  Wt 217 lb (98.431 kg)  BMI 38.45 kg/m2  SpO2 97%  Review of Systems denies LOC and weight change.    Objective:   Physical Exam VITAL SIGNS:  See vs page GENERAL: no distress  Lab Results  Component Value Date   HGBA1C 9.3* 10/13/2013      Assessment & Plan:  DM: she needs increased rx.  This insulin regimen was chosen from  multiple options, for its simplicity.  The benefits of glycemic control must be weighed against the risks of hypoglycemia.  a1c is much higher than would be suggested by reported cbg's.   Atherosclerosis: in this setting, she should avoid hypoglycemia.

## 2013-10-13 NOTE — Patient Instructions (Addendum)
check your blood sugar twice a day.  vary the time of day when you check, between before the 3 meals, and at bedtime.  also check if you have symptoms of your blood sugar being too high or too low.  please keep a record of the readings and bring it to your next appointment here.  please call us sooner if your blood sugar goes below 70, or if it stays over 200.   A diabetes blood test is requested for you today.  We'll contact you with results.   Pending the results, please continue 80 units each morning.   On this type of insulin schedule, you should eat meals on a regular schedule.  If a meal is missed or significantly delayed, your blood sugar could go low.  Please come back for a follow-up appointment in 3 months.

## 2013-12-01 ENCOUNTER — Telehealth: Payer: Self-pay | Admitting: Internal Medicine

## 2013-12-01 NOTE — Telephone Encounter (Signed)
Gonzella Lex, NP with [facility] is calling to let Dr. Jenny Reichmann know that she visited the patient today and she had a slightly irregular heart rate. She had no chest pain or SOB. Enid Derry can be reached back at Ph#: [phone]

## 2013-12-02 NOTE — Telephone Encounter (Signed)
Patient informed and did schedule appointment

## 2013-12-02 NOTE — Telephone Encounter (Signed)
Ask pt to make OV please for ecg and eval

## 2013-12-05 ENCOUNTER — Encounter: Payer: Self-pay | Admitting: Internal Medicine

## 2013-12-05 ENCOUNTER — Ambulatory Visit (INDEPENDENT_AMBULATORY_CARE_PROVIDER_SITE_OTHER): Payer: Medicare Other | Admitting: Internal Medicine

## 2013-12-05 ENCOUNTER — Other Ambulatory Visit (INDEPENDENT_AMBULATORY_CARE_PROVIDER_SITE_OTHER): Payer: Medicare Other

## 2013-12-05 ENCOUNTER — Other Ambulatory Visit: Payer: Self-pay | Admitting: Internal Medicine

## 2013-12-05 VITALS — BP 148/70 | HR 76 | Wt 220.5 lb

## 2013-12-05 DIAGNOSIS — E785 Hyperlipidemia, unspecified: Secondary | ICD-10-CM

## 2013-12-05 DIAGNOSIS — G629 Polyneuropathy, unspecified: Secondary | ICD-10-CM | POA: Insufficient documentation

## 2013-12-05 DIAGNOSIS — I499 Cardiac arrhythmia, unspecified: Secondary | ICD-10-CM

## 2013-12-05 DIAGNOSIS — I493 Ventricular premature depolarization: Secondary | ICD-10-CM | POA: Insufficient documentation

## 2013-12-05 DIAGNOSIS — Z0000 Encounter for general adult medical examination without abnormal findings: Secondary | ICD-10-CM

## 2013-12-05 DIAGNOSIS — I10 Essential (primary) hypertension: Secondary | ICD-10-CM

## 2013-12-05 DIAGNOSIS — I4949 Other premature depolarization: Secondary | ICD-10-CM

## 2013-12-05 LAB — BASIC METABOLIC PANEL
BUN: 26 mg/dL — ABNORMAL HIGH (ref 6–23)
CHLORIDE: 105 meq/L (ref 96–112)
CO2: 27 mEq/L (ref 19–32)
Calcium: 9.1 mg/dL (ref 8.4–10.5)
Creatinine, Ser: 1.1 mg/dL (ref 0.4–1.2)
GFR: 60.39 mL/min (ref >60.00)
Glucose, Bld: 169 mg/dL — ABNORMAL HIGH (ref 70–99)
POTASSIUM: 4 meq/L (ref 3.5–5.1)
Sodium: 140 mEq/L (ref 135–145)

## 2013-12-05 LAB — LIPID PANEL
CHOL/HDL RATIO: 6
Cholesterol: 222 mg/dL — ABNORMAL HIGH (ref 0–200)
HDL: 38.4 mg/dL — ABNORMAL LOW (ref >39.00)
LDL Cholesterol: 157 mg/dL — ABNORMAL HIGH (ref 0–99)
Triglycerides: 135 mg/dL (ref 0.0–149.0)
VLDL: 27 mg/dL (ref 0.0–40.0)

## 2013-12-05 LAB — HEPATIC FUNCTION PANEL
ALK PHOS: 75 U/L (ref 39–117)
ALT: 21 U/L (ref 0–35)
AST: 23 U/L (ref 0–37)
Albumin: 3.2 g/dL — ABNORMAL LOW (ref 3.5–5.2)
BILIRUBIN DIRECT: 0 mg/dL (ref 0.0–0.3)
BILIRUBIN TOTAL: 0.5 mg/dL (ref 0.3–1.2)
Total Protein: 7 g/dL (ref 6.0–8.3)

## 2013-12-05 LAB — URINALYSIS, ROUTINE W REFLEX MICROSCOPIC
Bilirubin Urine: NEGATIVE
Ketones, ur: NEGATIVE
NITRITE: NEGATIVE
Specific Gravity, Urine: 1.01 (ref 1.000–1.030)
TOTAL PROTEIN, URINE-UPE24: NEGATIVE
Urine Glucose: NEGATIVE
Urobilinogen, UA: 0.2 (ref 0.0–1.0)
pH: 6 (ref 5.0–8.0)

## 2013-12-05 LAB — TSH: TSH: 2.77 u[IU]/mL (ref 0.35–5.50)

## 2013-12-05 LAB — CBC WITH DIFFERENTIAL/PLATELET
BASOS PCT: 0.7 % (ref 0.0–3.0)
Basophils Absolute: 0 10*3/uL (ref 0.0–0.1)
EOS ABS: 0.2 10*3/uL (ref 0.0–0.7)
Eosinophils Relative: 2.6 % (ref 0.0–5.0)
HCT: 35.6 % — ABNORMAL LOW (ref 36.0–46.0)
Hemoglobin: 11.6 g/dL — ABNORMAL LOW (ref 12.0–15.0)
Lymphocytes Relative: 32.3 % (ref 12.0–46.0)
Lymphs Abs: 2.5 10*3/uL (ref 0.7–4.0)
MCHC: 32.6 g/dL (ref 30.0–36.0)
MCV: 86.9 fl (ref 78.0–100.0)
Monocytes Absolute: 0.7 10*3/uL (ref 0.1–1.0)
Monocytes Relative: 8.7 % (ref 3.0–12.0)
Neutro Abs: 4.2 10*3/uL (ref 1.4–7.7)
Neutrophils Relative %: 55.7 % (ref 43.0–77.0)
PLATELETS: 295 10*3/uL (ref 150.0–400.0)
RBC: 4.09 Mil/uL (ref 3.87–5.11)
RDW: 14.3 % (ref 11.5–14.6)
WBC: 7.6 10*3/uL (ref 4.5–10.5)

## 2013-12-05 MED ORDER — ATORVASTATIN CALCIUM 40 MG PO TABS: 40.0000 mg | ORAL_TABLET | Freq: Every day | ORAL | Status: DC

## 2013-12-05 NOTE — Assessment & Plan Note (Signed)
Noted today on exam, ECG reviewed as per emr, asympt, follow

## 2013-12-05 NOTE — Progress Notes (Signed)
Subjective:    Patient ID: Gail Lynch, female    DOB: 1941/08/09, 73 y.o.   MRN: [id number]  HPI  Here for wellness and f/u;  Overall doing ok;  Pt denies CP, worsening SOB, DOE, wheezing, orthopnea, PND, worsening LE edema, palpitations, dizziness or syncope.  Pt denies neurological change such as new headache, facial or extremity weakness.  Pt denies polydipsia, polyuria, or low sugar symptoms. Pt states overall good compliance with treatment and medications, good tolerability, and has been trying to follow lower cholesterol diet.  Pt denies worsening depressive symptoms, suicidal ideation or panic. No fever, night sweats, wt loss, loss of appetite, or other constitutional symptoms.  Pt states good ability with ADL's, has low fall risk, home safety reviewed and adequate, no other significant changes in hearing or vision, and only occasionally active with exercise.  No current complaints  She only take the 10 units NPH in the evening if cbg > 250, as she runs into low sugars. Vaginal bleeding has stopped Past Medical History  Diagnosis Date  . Diabetes mellitus   . Hypertension   . Hard of hearing   . Dysplasia of cervix, low grade (CIN 1)     s/p cervical conization  . HPV (human papilloma virus) infection   . Asthma   . Glaucoma   . Hyperlipidemia   . Colon polyps   . Intrinsic asthma, unspecified 12/18/2011  . Glaucoma 12/18/2011  . Carotid stenosis 06/18/2012  . PONV (postoperative nausea and vomiting)   . Mycotic toenails 02/19/2013   Past Surgical History  Procedure Laterality Date  . Breast biopsy    . Knee surgery  2010    patella fracture  . Breast biopsy  1991  . Dilation and curettage of uterus  2000    reports that she quit smoking about 33 years ago. She has quit using smokeless tobacco. She reports that she does not drink alcohol or use illicit drugs. family history includes Asthma in an other family member; Diabetes in her brother; Heart disease in her brother,  father, and mother. Allergies  Allergen Reactions  . Aspirin Other (See Comments)    BLEEDING BEHIND EYES  . Penicillins    Current Outpatient Prescriptions on File Prior to Visit  Medication Sig Dispense Refill  . allopurinol (ZYLOPRIM) 300 MG tablet Take 0.5 tablets (150 mg total) by mouth daily.  45 tablet  3  . Cholecalciferol (VITAMIN D3) 1000 UNITS CAPS Take 1 capsule by mouth daily.      . fish oil-omega-3 fatty acids 1000 MG capsule Take 2 g by mouth daily.      . Flaxseed, Linseed, (FLAX SEED OIL) 1000 MG CAPS Take 3 capsules by mouth daily.      . Garlic 5284 MG CAPS Take 1 capsule by mouth daily.      Marland Kitchen glucose blood (ONE TOUCH ULTRA TEST) test strip 1 each by Other route 2 (two) times daily. And lancets 2/day 250.01  180 each  3  . insulin NPH (HUMULIN N,NOVOLIN N) 100 UNIT/ML injection 80 units each morning, and 10 units each evening, and syringes 2/day      . Polyethylene Glycol 3350 (MIRALAX PO) Take 17 g by mouth daily as needed.       . pyridoxine (CVS VITAMIN B-6) 200 MG tablet Take 200 mg by mouth daily.      . valsartan-hydrochlorothiazide (DIOVAN-HCT) 160-12.5 MG per tablet TAKE ONE TABLET BY MOUTH EVERY DAY  90 tablet  3  .  vitamin B-12 (CYANOCOBALAMIN) 500 MCG tablet Take 500 mcg by mouth daily.       Marland Kitchen atorvastatin (LIPITOR) 40 MG tablet Take 40 mg by mouth daily.      . misoprostol (CYTOTEC) 200 MCG tablet Take 2 tablets (400 mcg total) by mouth once.  2 tablet  0   No current facility-administered medications on file prior to visit.    Review of Systems Constitutional: Negative for diaphoresis, activity change, appetite change or unexpected weight change.  HENT: Negative for hearing loss, ear pain, facial swelling, mouth sores and neck stiffness.   Eyes: Negative for pain, redness and visual disturbance.  Respiratory: Negative for shortness of breath and wheezing.   Cardiovascular: Negative for chest pain and palpitations.  Gastrointestinal: Negative for  diarrhea, blood in stool, abdominal distention or other pain Genitourinary: Negative for hematuria, flank pain or change in urine volume.  Musculoskeletal: Negative for myalgias and joint swelling.  Skin: Negative for color change and wound.  Neurological: Negative for syncope and numbness. other than noted Hematological: Negative for adenopathy.  Psychiatric/Behavioral: Negative for hallucinations, self-injury, decreased concentration and agitation.      Objective:   Physical Exam BP 148/70  Pulse 76  Wt 220 lb 8 oz (100.018 kg) VS noted,  Constitutional: Pt is oriented to person, place, and time. Appears well-developed and well-nourished. Annabell Sabal Head: Normocephalic and atraumatic.  Right Ear: External ear normal.  Left Ear: External ear normal.  Nose: Nose normal.  Mouth/Throat: Oropharynx is clear and moist.  Eyes: Conjunctivae and EOM are normal. Pupils are equal, round, and reactive to light.  Neck: Normal range of motion. Neck supple. No JVD present. No tracheal deviation present.  Cardiovascular: Normal rate, regular rhythm, normal heart sounds and intact distal pulses.  but with some ? Ectopy  - for ecg Pulmonary/Chest: Effort normal and breath sounds normal.  Abdominal: Soft. Bowel sounds are normal. There is no tenderness. No HSM  Musculoskeletal: Normal range of motion. Exhibits no edema.  Lymphadenopathy:  Has no cervical adenopathy.  Neurological: Pt is alert and oriented to person, place, and time. Pt has normal reflexes. No cranial nerve deficit.  Skin: Skin is warm and dry. No rash noted.  Psychiatric:  Has  normal mood and affect. Behavior is normal.     Assessment & Plan:

## 2013-12-05 NOTE — Assessment & Plan Note (Signed)

## 2013-12-05 NOTE — Progress Notes (Signed)
Pre visit review using our clinic review tool, if applicable. No additional management support is needed unless otherwise documented below in the visit note. 

## 2013-12-05 NOTE — Patient Instructions (Signed)
Your EKG was OK today  Please continue all other medications as before, and refills have been done if requested. Please have the pharmacy call with any other refills you may need.  Please continue your efforts at being more active, low cholesterol diet, and weight control. You are otherwise up to date with prevention measures today.  Please keep your appointments with your specialists as you have planned - Dr Loanne Drilling  Please go to the LAB in the Basement (turn left off the elevator) for the tests to be done today You will be contacted by phone if any changes need to be made immediately.  Otherwise, you will receive a letter about your results with an explanation, but please check with MyChart first.  Please remember to sign up for MyChart if you have not done so, as this will be important to you in the future with finding out test results, communicating by private email, and scheduling acute appointments online when needed.  Please return in 6 months, or sooner if needed

## 2013-12-11 ENCOUNTER — Ambulatory Visit (INDEPENDENT_AMBULATORY_CARE_PROVIDER_SITE_OTHER): Payer: Medicare Other | Admitting: Podiatrist

## 2013-12-11 ENCOUNTER — Encounter: Payer: Self-pay | Admitting: Podiatrist

## 2013-12-11 VITALS — BP 105/90 | HR 96 | Resp 12

## 2013-12-11 DIAGNOSIS — M79676 Pain in unspecified toe(s): Principal | ICD-10-CM

## 2013-12-11 DIAGNOSIS — B351 Tinea unguium: Secondary | ICD-10-CM

## 2013-12-11 DIAGNOSIS — M79609 Pain in unspecified limb: Secondary | ICD-10-CM

## 2013-12-14 NOTE — Progress Notes (Signed)
HPI:  Patient presents today for follow up of foot and nail care. Denies any new complaints today.  Objective:  Patients chart is reviewed.  Vascular status reveals pedal pulses noted at 2 out of 4 dp and pt bilateral .  Neurological sensation is Normal to Semmes Weinstein monofilament bilateral.  Patients nails are thickened, discolored, distrophic, friable and brittle with yellow-brown discoloration. Patient subjectively relates they are painful with shoes and with ambulation of bilateral feet.  Assessment:  Symptomatic onychomycosis  Plan:  Discussed treatment options and alternatives.  The symptomatic toenails were debrided through manual an mechanical means without complication.  Return appointment recommended at routine intervals of 3 months    Kathryn Egerton, DPM   

## 2013-12-18 ENCOUNTER — Ambulatory Visit: Payer: Medicare Other | Admitting: Internal Medicine

## 2013-12-24 ENCOUNTER — Other Ambulatory Visit: Payer: Self-pay | Admitting: Internal Medicine

## 2013-12-24 DIAGNOSIS — Z1231 Encounter for screening mammogram for malignant neoplasm of breast: Secondary | ICD-10-CM

## 2014-01-13 ENCOUNTER — Encounter: Payer: Self-pay | Admitting: Endocrinology

## 2014-01-13 ENCOUNTER — Ambulatory Visit (INDEPENDENT_AMBULATORY_CARE_PROVIDER_SITE_OTHER): Payer: Medicare Other | Admitting: Endocrinology

## 2014-01-13 VITALS — BP 124/66 | HR 75 | Temp 98.2°F | Ht 63.0 in | Wt 219.0 lb

## 2014-01-13 DIAGNOSIS — E1065 Type 1 diabetes mellitus with hyperglycemia: Principal | ICD-10-CM

## 2014-01-13 DIAGNOSIS — E1039 Type 1 diabetes mellitus with other diabetic ophthalmic complication: Secondary | ICD-10-CM

## 2014-01-13 LAB — HEMOGLOBIN A1C: HEMOGLOBIN A1C: 8.8 % — AB (ref 4.6–6.5)

## 2014-01-13 NOTE — Progress Notes (Signed)
Subjective:    Patient ID: Gail Lynch, female    DOB: [birth year], 73 y.o.   MRN: [id number]  HPI Pt return for f/u of IDDM (dx'ed 1990, when she presented with burry vision; she has moderate sensory neuropathy of the lower extremities, and associated retinopathy; she has chosen a simple insulin regimen; she has never had pancreatitis, severe hypoglycemia, or DKA; she takes human insulin, due to cost).  pt states she feels well in general.  She only takes 80 units qam.  she brings a record of her cbg's which i have reviewed today.  It varies from 50-200.  There is no trend throughout the day.   Past Medical History  Diagnosis Date  . Diabetes mellitus   . Hypertension   . Hard of hearing   . Dysplasia of cervix, low grade (CIN 1)     s/p cervical conization  . HPV (human papilloma virus) infection   . Asthma   . Glaucoma   . Hyperlipidemia   . Colon polyps   . Intrinsic asthma, unspecified 12/18/2011  . Glaucoma 12/18/2011  . Carotid stenosis 06/18/2012  . PONV (postoperative nausea and vomiting)   . Mycotic toenails 02/19/2013    Past Surgical History  Procedure Laterality Date  . Breast biopsy    . Knee surgery  2010    patella fracture  . Breast biopsy  1991  . Dilation and curettage of uterus  2000    History   Social History  . Marital Status: Widowed    Spouse Name: N/A    Number of Children: 3  . Years of Education: N/A   Occupational History  . retired    Social History Main Topics  . Smoking status: Former Smoker    Quit date: 08/21/1980  . Smokeless tobacco: Former Systems developer  . Alcohol Use: No  . Drug Use: No  . Sexual Activity: Not on file   Other Topics Concern  . Not on file   Social History Narrative  . No narrative on file    Current Outpatient Prescriptions on File Prior to Visit  Medication Sig Dispense Refill  . allopurinol (ZYLOPRIM) 300 MG tablet Take 0.5 tablets (150 mg total) by mouth daily.  45 tablet  3  . atorvastatin (LIPITOR) 40 MG  tablet Take 1 tablet (40 mg total) by mouth daily.  90 tablet  3  . Cholecalciferol (VITAMIN D3) 1000 UNITS CAPS Take 1 capsule by mouth daily.      . fish oil-omega-3 fatty acids 1000 MG capsule Take 2 g by mouth daily.      . Flaxseed, Linseed, (FLAX SEED OIL) 1000 MG CAPS Take 3 capsules by mouth daily.      . Garlic 3154 MG CAPS Take 1 capsule by mouth daily.      Marland Kitchen glucose blood (ONE TOUCH ULTRA TEST) test strip 1 each by Other route 2 (two) times daily. And lancets 2/day 250.01  180 each  3  . insulin NPH (HUMULIN N,NOVOLIN N) 100 UNIT/ML injection 80 units each morning, and 5 units each evening, and syringes 2/day      . misoprostol (CYTOTEC) 200 MCG tablet Take 2 tablets (400 mcg total) by mouth once.  2 tablet  0  . Polyethylene Glycol 3350 (MIRALAX PO) Take 17 g by mouth daily as needed.       . pyridoxine (CVS VITAMIN B-6) 200 MG tablet Take 200 mg by mouth daily.      . valsartan-hydrochlorothiazide (DIOVAN-HCT) 160-12.5  MG per tablet TAKE ONE TABLET BY MOUTH EVERY DAY  90 tablet  3  . vitamin B-12 (CYANOCOBALAMIN) 500 MCG tablet Take 500 mcg by mouth daily.        No current facility-administered medications on file prior to visit.    Allergies  Allergen Reactions  . Aspirin Other (See Comments)    BLEEDING BEHIND EYES  . Penicillins     Family History  Problem Relation Age of Onset  . Diabetes Brother   . Heart disease Brother   . Heart disease Mother   . Heart disease Father   . Asthma      BP 124/66  Pulse 75  Temp(Src) 98.2 F (36.8 C) (Oral)  Ht 5\' 3"  (1.6 m)  Wt 219 lb (99.338 kg)  BMI 38.80 kg/m2  SpO2 95%  Review of Systems She denies hypoglycemia and weight change.      Objective:   Physical Exam VITAL SIGNS:  See vs page GENERAL: no distress Pulses: dorsalis pedis intact bilat.   Feet: no deformity. normal color and temp.  1+ bilat leg edema Skin:  no ulcer on the feet.   Neuro: sensation is intact to touch on the feet  Lab Results    Component Value Date   HGBA1C 8.8* 01/13/2014      Assessment & Plan:  DM: moderate exacerbation.  She needs increased rx.  therapy limited by pt's request for least expensive meds. Hypoglycemia, new, due to insulin.  This complicates the rx of her DM.  This impairs the ability to achieve glycemic control.  I'll work around this as best I can     Patient Instructions  check your blood sugar twice a day.  vary the time of day when you check, between before the 3 meals, and at bedtime.  also check if you have symptoms of your blood sugar being too high or too low.  please keep a record of the readings and bring it to your next appointment here.  please call us sooner if your blood sugar goes below 70, or if it stays over 200.   A diabetes blood test is requested for you today.  We'll contact you with results.   Please reduce the evening insulin to 5 units.     On this type of insulin schedule, you should eat meals on a regular schedule.  If a meal is missed or significantly delayed, your blood sugar could go low.  Please come back for a follow-up appointment in 3 months.

## 2014-01-13 NOTE — Patient Instructions (Addendum)
check your blood sugar twice a day.  vary the time of day when you check, between before the 3 meals, and at bedtime.  also check if you have symptoms of your blood sugar being too high or too low.  please keep a record of the readings and bring it to your next appointment here.  please call us sooner if your blood sugar goes below 70, or if it stays over 200.   A diabetes blood test is requested for you today.  We'll contact you with results.   Please reduce the evening insulin to 5 units.     On this type of insulin schedule, you should eat meals on a regular schedule.  If a meal is missed or significantly delayed, your blood sugar could go low.  Please come back for a follow-up appointment in 3 months.

## 2014-01-14 ENCOUNTER — Telehealth: Payer: Self-pay | Admitting: Endocrinology

## 2014-01-14 MED ORDER — INSULIN NPH ISOPHANE & REGULAR (70-30) 100 UNIT/ML ~~LOC~~ SUSP: SUBCUTANEOUS | Status: DC

## 2014-01-14 NOTE — Telephone Encounter (Signed)
Ok, i have sent a prescription to your pharmacy  

## 2014-01-14 NOTE — Telephone Encounter (Signed)
Pt advised.

## 2014-01-26 ENCOUNTER — Ambulatory Visit (HOSPITAL_COMMUNITY)
Admission: RE | Admit: 2014-01-26 | Discharge: 2014-01-26 | Disposition: A | Discharge status: Home / Self Care | Payer: Medicare Other | Source: Ambulatory Visit | Attending: Internal Medicine | Admitting: Internal Medicine

## 2014-01-26 DIAGNOSIS — Z1231 Encounter for screening mammogram for malignant neoplasm of breast: Secondary | ICD-10-CM | POA: Insufficient documentation

## 2014-01-28 ENCOUNTER — Other Ambulatory Visit: Payer: Self-pay | Admitting: Internal Medicine

## 2014-01-28 DIAGNOSIS — R928 Other abnormal and inconclusive findings on diagnostic imaging of breast: Secondary | ICD-10-CM

## 2014-02-05 ENCOUNTER — Other Ambulatory Visit (HOSPITAL_COMMUNITY): Payer: Self-pay | Admitting: Cardiology

## 2014-02-05 DIAGNOSIS — I6529 Occlusion and stenosis of unspecified carotid artery: Secondary | ICD-10-CM

## 2014-02-06 ENCOUNTER — Telehealth: Payer: Self-pay | Admitting: Obstetrics & Gynecology

## 2014-02-06 ENCOUNTER — Ambulatory Visit
Admission: RE | Admit: 2014-02-06 | Discharge: 2014-02-06 | Disposition: A | Discharge status: Home / Self Care | Payer: Medicare Other | Source: Ambulatory Visit | Attending: Internal Medicine | Admitting: Internal Medicine

## 2014-02-06 ENCOUNTER — Ambulatory Visit (INDEPENDENT_AMBULATORY_CARE_PROVIDER_SITE_OTHER): Payer: Medicare Other | Admitting: Obstetrics & Gynecology

## 2014-02-06 ENCOUNTER — Encounter: Payer: Self-pay | Admitting: Obstetrics & Gynecology

## 2014-02-06 VITALS — BP 119/63 | HR 75 | Temp 98.7°F | Ht 63.0 in | Wt 214.9 lb

## 2014-02-06 DIAGNOSIS — R928 Other abnormal and inconclusive findings on diagnostic imaging of breast: Secondary | ICD-10-CM

## 2014-02-06 DIAGNOSIS — N950 Postmenopausal bleeding: Secondary | ICD-10-CM

## 2014-02-06 NOTE — Telephone Encounter (Signed)
Erroneous encournter

## 2014-02-06 NOTE — Progress Notes (Signed)
Subjective:     Patient ID: Gail Lynch, female   DOB: [birth year], 73 y.o.   MRN: [id number]  HPI Pt presents for f/u.  She denies problems.  She reports that she had an episode of one day of spotting PRIOR to May but, it was scant and has not returned.  She denies pelvic pain.     Review of Systems     Objective:   Physical Exam Pt in NAD BP 119/63  Pulse 75  Temp(Src) 98.7 F (37.1 C)  Ht 5\' 3"  (1.6 m)  Wt 214 lb 14.4 oz (97.478 kg)  BMI 38.08 kg/m2 Abd: obese, NT, ND GU: EGBUS: there is a tag or condyloma on her anus Vagina: no blood in vault Cervix: no lesion; no mucopurulent d/c Uterus: small, mobile Adnexa: no masses; non tender (difficult to assess due to body habitus)          08/2012 *RADIOLOGY REPORT*  Clinical Data: Postmenopausal vaginal bleeding. Remote dilatation  and curettage. Thickened endometrium on CT 07/27/2012  TRANSABDOMINAL AND TRANSVAGINAL ULTRASOUND OF PELVIS  Technique: Both transabdominal and transvaginal ultrasound  examinations of the pelvis were performed. Transabdominal  technique was performed for global imaging of the pelvis including  uterus, ovaries, adnexal regions, and pelvic cul-de-sac.  It was necessary to proceed with endovaginal exam following the  transabdominal exam to visualize the uterus and ovaries.  Comparison: CT 07/27/2012  Findings:  Uterus: Anteverted, anteflexed. 9.7 x 6.7 x 5.4 cm. No focal  abnormality.  Endometrium: 2.0 cm, uniformly inhomogeneously echogenic and  thickened with internal cystic spaces.  Right ovary: 2.4 x 2.5 x 1.6 cm. Not well visualized due to  overlying bowel but no focal abnormality.  Left ovary: 2.6 x 1.5 x 1.5 cm. Normal.  Other Findings: No free fluid.  IMPRESSION:  Normal endometrial thickening with internal cystic spaces.  Differential considerations include endometrial hyperplasia,  neoplasia, or polyp. In the setting of post-menopausal bleeding,  endometrial sampling is indicated  to exclude carcinoma. If results  are benign, sonohysterogram should be considered for focal lesion  work-up. (Ref: Radiological Reasoning: Algorithmic Workup of  Abnormal Vaginal Bleeding with Endovaginal Sonography and  Sonohysterography. AJR 2008; [id number]:P61-95)  09/04/2012  Diagnosis Endometrium, biopsy - SMALL FRAGMENT OF BLOOD CLOT, NON-DIAGNOSTIC. Assessment:     Post menopausal bleeding- prev had thickened endometrium but, neg biopsy.      Plan:     Repeat sono.  If endometrial stripe thin will not repeat endobx,  If thickened endometrial stripe will repeat biopsy F/u in 6 weeks or sooner prn

## 2014-02-06 NOTE — Telephone Encounter (Deleted)
Called pt to see how she was doing post op.  She reports some freq urination and some soreness in her throat.  She was able to eat a small breakfast.  She reports adequate pain control.  She was encouraged to call if she has any post op problems.  She is scheduled for a 2 week post op check in my ofc.  Carolyn L. Harraway-Smith, M.D., Cherlynn June

## 2014-02-11 ENCOUNTER — Ambulatory Visit (HOSPITAL_COMMUNITY)
Admission: RE | Admit: 2014-02-11 | Discharge: 2014-02-11 | Disposition: A | Discharge status: Home / Self Care | Payer: Medicare Other | Source: Ambulatory Visit | Attending: Obstetrics & Gynecology | Admitting: Obstetrics & Gynecology

## 2014-02-11 DIAGNOSIS — N950 Postmenopausal bleeding: Secondary | ICD-10-CM | POA: Diagnosis present

## 2014-02-11 DIAGNOSIS — N8500 Endometrial hyperplasia, unspecified: Secondary | ICD-10-CM | POA: Insufficient documentation

## 2014-02-12 ENCOUNTER — Telehealth: Payer: Self-pay | Admitting: Obstetrics & Gynecology

## 2014-02-12 ENCOUNTER — Ambulatory Visit (HOSPITAL_COMMUNITY): Payer: Medicare Other | Attending: Internal Medicine | Admitting: Cardiology

## 2014-02-12 DIAGNOSIS — I6529 Occlusion and stenosis of unspecified carotid artery: Secondary | ICD-10-CM | POA: Insufficient documentation

## 2014-02-12 NOTE — Telephone Encounter (Signed)
T.C. To pt to discuss thickened endometrium.  I have discussed with her the risk of endometrial cancer.  I have reviewed with her that we discussed this over 1 year ago. Pt agrees to schedule a hysteroscopy with dilation and curettage.       Patient desires surgical management with hysteroscopy with dilation and curretage.  The risks of surgery were discussed in detail with the patient including but not limited to: bleeding which may require transfusion or reoperation; infection which may require prolonged hospitalization or re-hospitalization and antibiotic therapy; injury to bowel, bladder, ureters and major vessels or other surrounding organs; need for additional procedures including laparotomy; thromboembolic phenomenon, incisional problems and other postoperative or anesthesia complications.  Patient was told that the likelihood that her condition and symptoms will be treated effectively with this surgical management was very high; the postoperative expectations were also discussed in detail. The patient also understands the alternative treatment options which were discussed in full. All questions were answered.  She was told that she will be contacted by our surgical scheduler regarding the time and date of her surgery; routine preoperative instructions of having nothing to eat or drink after midnight on the day prior to surgery and also coming to the hospital 1 1/2 hours prior to her time of surgery were also emphasized.  She was told she may be called for a preoperative appointment about a week prior to surgery and will be given further preoperative instructions at that visit. Printed patient education handouts about the procedure were given to the patient to review at home.

## 2014-02-12 NOTE — Progress Notes (Signed)
Carotid duplex performed 

## 2014-02-24 ENCOUNTER — Telehealth: Payer: Self-pay | Admitting: Obstetrics & Gynecology

## 2014-02-24 ENCOUNTER — Encounter (HOSPITAL_COMMUNITY): Payer: Self-pay | Admitting: Pharmacist

## 2014-02-24 NOTE — Telephone Encounter (Signed)
T.C. To pt she feel like her biopsy will be ok. She reports that she has D&C's in the past and nothing was ever found.  She reports that Dr. Raphael Gibney evaluated her in the past.  She denies pain and feel like she should not have the procedure.    She mentioned her cataracts being stable.  I explained to her that this is not related but she feels like her blood work with her primary care doc was 'ok'  She reports that the bleeding didn't 'happen overnight' but, she thinks that it will be ok.  I have explained to her that bleeding with a thickened endometrium post menopause is 'bleeding until proven otherwise'.     She ended with agreeing to have it done.  She reports that she is not scared but, would like it done in the morning, the earliest possible appt.  Zionah Criswell L. Ihor Dow, M.D., [place] .

## 2014-02-25 NOTE — Patient Instructions (Addendum)
   Your procedure is scheduled on:  Wednesday, July 15  Enter through the [employer] of [facility] at:  1 PM Pick up the phone at the desk and dial [phone] and inform us of your arrival.  Please call this number if you have any problems the morning of surgery: [phone]  Remember: Do not eat food after midnight: Tuesday Do not drink clear liquids after: 1030 AM Wednesday, day of surgery Take these medicines the morning of surgery with a SIP OF WATER:  None.  Patient on Tuesday night to half her insulin - take only 5 units.  Patient instructed not to take diabetes meds on Wednesday morning, day of surgery.  We will check you blood surgar in Short Stay upon arrival.  Do not wear jewelry, make-up, or FINGER nail polish No metal in your hair or on your body. Do not wear lotions, powders, perfumes.  You may wear deodorant.  Do not bring valuables to the hospital. Contacts, dentures or bridgework may not be worn into surgery.  Patients discharged on the day of surgery will not be allowed to drive home.  Home with daughter

## 2014-02-27 ENCOUNTER — Encounter (HOSPITAL_COMMUNITY)
Admission: RE | Admit: 2014-02-27 | Discharge: 2014-02-27 | Disposition: A | Discharge status: Home / Self Care | Payer: Medicare Other | Source: Ambulatory Visit | Attending: Obstetrics & Gynecology | Admitting: Obstetrics & Gynecology

## 2014-02-27 ENCOUNTER — Encounter (HOSPITAL_COMMUNITY): Payer: Self-pay

## 2014-02-27 DIAGNOSIS — Z01812 Encounter for preprocedural laboratory examination: Secondary | ICD-10-CM | POA: Insufficient documentation

## 2014-02-27 HISTORY — DX: Unspecified osteoarthritis, unspecified site: M19.90

## 2014-02-27 LAB — BASIC METABOLIC PANEL
Anion gap: 11 (ref 5–15)
BUN: 17 mg/dL (ref 6–23)
CALCIUM: 9.6 mg/dL (ref 8.4–10.5)
CO2: 28 meq/L (ref 19–32)
Chloride: 103 mEq/L (ref 96–112)
Creatinine, Ser: 1.21 mg/dL — ABNORMAL HIGH (ref 0.50–1.10)
GFR calc Af Amer: 49 mL/min — ABNORMAL LOW (ref >90)
GFR calc non Af Amer: 43 mL/min — ABNORMAL LOW (ref >90)
GLUCOSE: 155 mg/dL — AB (ref 70–99)
POTASSIUM: 4.2 meq/L (ref 3.7–5.3)
SODIUM: 142 meq/L (ref 137–147)

## 2014-02-27 LAB — CBC
HEMATOCRIT: 35.8 % — AB (ref 36.0–46.0)
Hemoglobin: 11.7 g/dL — ABNORMAL LOW (ref 12.0–15.0)
MCH: 28.7 pg (ref 26.0–34.0)
MCHC: 32.7 g/dL (ref 30.0–36.0)
MCV: 88 fL (ref 78.0–100.0)
Platelets: 283 10*3/uL (ref 150–400)
RBC: 4.07 MIL/uL (ref 3.87–5.11)
RDW: 14.9 % (ref 11.5–15.5)
WBC: 7.9 10*3/uL (ref 4.0–10.5)

## 2014-03-04 ENCOUNTER — Encounter (HOSPITAL_COMMUNITY): Payer: Medicare Other | Admitting: Anesthesiology

## 2014-03-04 ENCOUNTER — Ambulatory Visit (HOSPITAL_COMMUNITY): Payer: Medicare Other

## 2014-03-04 ENCOUNTER — Encounter (HOSPITAL_COMMUNITY): Payer: Self-pay

## 2014-03-04 ENCOUNTER — Ambulatory Visit (HOSPITAL_COMMUNITY)
Admission: RE | Admit: 2014-03-04 | Discharge: 2014-03-05 | Disposition: A | Discharge status: Home / Self Care | Payer: Medicare Other | Source: Ambulatory Visit | Attending: Obstetrics & Gynecology | Admitting: Obstetrics & Gynecology

## 2014-03-04 ENCOUNTER — Ambulatory Visit (HOSPITAL_COMMUNITY): Payer: Medicare Other | Admitting: Anesthesiology

## 2014-03-04 ENCOUNTER — Encounter (HOSPITAL_COMMUNITY)
Admission: RE | Disposition: A | Discharge status: Home / Self Care | Payer: Self-pay | Source: Ambulatory Visit | Attending: Obstetrics & Gynecology

## 2014-03-04 DIAGNOSIS — E785 Hyperlipidemia, unspecified: Secondary | ICD-10-CM | POA: Insufficient documentation

## 2014-03-04 DIAGNOSIS — N950 Postmenopausal bleeding: Secondary | ICD-10-CM | POA: Insufficient documentation

## 2014-03-04 DIAGNOSIS — D126 Benign neoplasm of colon, unspecified: Secondary | ICD-10-CM | POA: Insufficient documentation

## 2014-03-04 DIAGNOSIS — R9389 Abnormal findings on diagnostic imaging of other specified body structures: Secondary | ICD-10-CM | POA: Diagnosis present

## 2014-03-04 DIAGNOSIS — I6529 Occlusion and stenosis of unspecified carotid artery: Secondary | ICD-10-CM | POA: Insufficient documentation

## 2014-03-04 DIAGNOSIS — I10 Essential (primary) hypertension: Secondary | ICD-10-CM | POA: Insufficient documentation

## 2014-03-04 DIAGNOSIS — E119 Type 2 diabetes mellitus without complications: Secondary | ICD-10-CM | POA: Insufficient documentation

## 2014-03-04 DIAGNOSIS — Z87891 Personal history of nicotine dependence: Secondary | ICD-10-CM | POA: Insufficient documentation

## 2014-03-04 DIAGNOSIS — M109 Gout, unspecified: Secondary | ICD-10-CM | POA: Diagnosis not present

## 2014-03-04 DIAGNOSIS — B977 Papillomavirus as the cause of diseases classified elsewhere: Secondary | ICD-10-CM

## 2014-03-04 DIAGNOSIS — Z9889 Other specified postprocedural states: Secondary | ICD-10-CM

## 2014-03-04 HISTORY — PX: HYSTEROSCOPY WITH D & C: SHX1775

## 2014-03-04 LAB — CBC
HEMATOCRIT: 33.6 % — AB (ref 36.0–46.0)
HEMOGLOBIN: 10.9 g/dL — AB (ref 12.0–15.0)
MCH: 28.6 pg (ref 26.0–34.0)
MCHC: 32.4 g/dL (ref 30.0–36.0)
MCV: 88.2 fL (ref 78.0–100.0)
Platelets: 256 10*3/uL (ref 150–400)
RBC: 3.81 MIL/uL — AB (ref 3.87–5.11)
RDW: 15 % (ref 11.5–15.5)
WBC: 11.3 10*3/uL — ABNORMAL HIGH (ref 4.0–10.5)

## 2014-03-04 LAB — GLUCOSE, CAPILLARY
Glucose-Capillary: 138 mg/dL — ABNORMAL HIGH (ref 70–99)
Glucose-Capillary: 158 mg/dL — ABNORMAL HIGH (ref 70–99)
Glucose-Capillary: 241 mg/dL — ABNORMAL HIGH (ref 70–99)

## 2014-03-04 SURGERY — DILATATION AND CURETTAGE /HYSTEROSCOPY
Anesthesia: General | Site: Uterus

## 2014-03-04 MED ORDER — LACTATED RINGERS IV SOLN
Freq: Once | INTRAVENOUS | Status: AC
  Administered 2014-03-04: 14:00:00 via INTRAVENOUS

## 2014-03-04 MED ORDER — ONDANSETRON HCL 4 MG/2ML IJ SOLN
4.0000 mg | INTRAMUSCULAR | Status: DC | PRN
  Administered 2014-03-04: 4 mg via INTRAVENOUS
  Filled 2014-03-04: qty 2

## 2014-03-04 MED ORDER — MEPERIDINE HCL 25 MG/ML IJ SOLN: 6.2500 mg | INTRAMUSCULAR | Status: DC | PRN

## 2014-03-04 MED ORDER — FENTANYL CITRATE 0.05 MG/ML IJ SOLN
INTRAMUSCULAR | Status: DC | PRN
  Administered 2014-03-04 (×2): 50 ug via INTRAVENOUS

## 2014-03-04 MED ORDER — FENTANYL CITRATE 0.05 MG/ML IJ SOLN
INTRAMUSCULAR | Status: AC
  Administered 2014-03-04: 25 ug via INTRAVENOUS
  Filled 2014-03-04: qty 2

## 2014-03-04 MED ORDER — METOCLOPRAMIDE HCL 5 MG/ML IJ SOLN
INTRAMUSCULAR | Status: AC
  Administered 2014-03-04: 10 mg via INTRAVENOUS
  Filled 2014-03-04: qty 2

## 2014-03-04 MED ORDER — GLYCINE 1.5 % IR SOLN
Status: DC | PRN
  Administered 2014-03-04: 3000 mL

## 2014-03-04 MED ORDER — BUPIVACAINE HCL (PF) 0.5 % IJ SOLN
INTRAMUSCULAR | Status: DC | PRN
  Administered 2014-03-04: 10 mL

## 2014-03-04 MED ORDER — ONDANSETRON HCL 4 MG/2ML IJ SOLN
INTRAMUSCULAR | Status: AC
  Filled 2014-03-04: qty 2

## 2014-03-04 MED ORDER — LIDOCAINE HCL (CARDIAC) 20 MG/ML IV SOLN
INTRAVENOUS | Status: DC | PRN
  Administered 2014-03-04: 50 mg via INTRAVENOUS

## 2014-03-04 MED ORDER — PROPOFOL 10 MG/ML IV EMUL
INTRAVENOUS | Status: AC
  Filled 2014-03-04: qty 20

## 2014-03-04 MED ORDER — IOHEXOL 300 MG/ML  SOLN
100.0000 mL | Freq: Once | INTRAMUSCULAR | Status: AC | PRN
  Administered 2014-03-04: 100 mL via INTRAVENOUS

## 2014-03-04 MED ORDER — PROPOFOL 10 MG/ML IV BOLUS
INTRAVENOUS | Status: DC | PRN
  Administered 2014-03-04: 100 mg via INTRAVENOUS
  Administered 2014-03-04 (×2): 50 mg via INTRAVENOUS

## 2014-03-04 MED ORDER — METOCLOPRAMIDE HCL 5 MG/ML IJ SOLN
10.0000 mg | Freq: Once | INTRAMUSCULAR | Status: AC
  Administered 2014-03-04: 10 mg via INTRAVENOUS

## 2014-03-04 MED ORDER — PANTOPRAZOLE SODIUM 40 MG PO TBEC
40.0000 mg | DELAYED_RELEASE_TABLET | Freq: Every day | ORAL | Status: DC
  Administered 2014-03-05: 40 mg via ORAL
  Filled 2014-03-04: qty 1

## 2014-03-04 MED ORDER — FENTANYL CITRATE 0.05 MG/ML IJ SOLN
25.0000 ug | INTRAMUSCULAR | Status: DC | PRN
  Administered 2014-03-04 (×2): 25 ug via INTRAVENOUS
  Administered 2014-03-04: 50 ug via INTRAVENOUS

## 2014-03-04 MED ORDER — BUPIVACAINE HCL (PF) 0.5 % IJ SOLN
INTRAMUSCULAR | Status: AC
Start: 2014-03-04 — End: 2014-03-04
  Filled 2014-03-04: qty 30

## 2014-03-04 MED ORDER — ONDANSETRON HCL 4 MG/2ML IJ SOLN
INTRAMUSCULAR | Status: DC | PRN
  Administered 2014-03-04: 4 mg via INTRAVENOUS

## 2014-03-04 MED ORDER — ONDANSETRON HCL 4 MG/2ML IJ SOLN: 4.0000 mg | Freq: Once | INTRAMUSCULAR | Status: DC | PRN

## 2014-03-04 MED ORDER — SUCCINYLCHOLINE CHLORIDE 20 MG/ML IJ SOLN
INTRAMUSCULAR | Status: DC | PRN
  Administered 2014-03-04: 40 mg via INTRAVENOUS

## 2014-03-04 MED ORDER — SUCCINYLCHOLINE CHLORIDE 20 MG/ML IJ SOLN
INTRAMUSCULAR | Status: AC
  Filled 2014-03-04: qty 10

## 2014-03-04 MED ORDER — FENTANYL CITRATE 0.05 MG/ML IJ SOLN
INTRAMUSCULAR | Status: AC
  Filled 2014-03-04: qty 2

## 2014-03-04 MED ORDER — SIMETHICONE 80 MG PO CHEW: 80.0000 mg | CHEWABLE_TABLET | Freq: Four times a day (QID) | ORAL | Status: DC | PRN

## 2014-03-04 MED ORDER — LACTATED RINGERS IV SOLN
INTRAVENOUS | Status: DC
  Administered 2014-03-04 (×3): via INTRAVENOUS

## 2014-03-04 MED ORDER — INSULIN ASPART 100 UNIT/ML ~~LOC~~ SOLN
10.0000 [IU] | Freq: Once | SUBCUTANEOUS | Status: AC
  Administered 2014-03-04: 10 [IU] via SUBCUTANEOUS

## 2014-03-04 MED ORDER — LIDOCAINE HCL (CARDIAC) 20 MG/ML IV SOLN
INTRAVENOUS | Status: AC
  Filled 2014-03-04: qty 5

## 2014-03-04 MED ORDER — TRAMADOL HCL 50 MG PO TABS: 50.0000 mg | ORAL_TABLET | Freq: Four times a day (QID) | ORAL | Status: DC | PRN

## 2014-03-04 SURGICAL SUPPLY — 14 items
CATH ROBINSON RED A/P 16FR (CATHETERS) ×3 IMPLANT
CLOTH BEACON ORANGE TIMEOUT ST (SAFETY) ×3 IMPLANT
CONTAINER PREFILL 10% NBF 60ML (FORM) ×2 IMPLANT
DRAPE HYSTEROSCOPY (DRAPE) ×3 IMPLANT
GLOVE BIO SURGEON STRL SZ7 (GLOVE) ×3 IMPLANT
GLOVE BIOGEL PI IND STRL 7.0 (GLOVE) ×1 IMPLANT
GLOVE BIOGEL PI INDICATOR 7.0 (GLOVE) ×2
GOWN STRL REUS W/TWL LRG LVL3 (GOWN DISPOSABLE) ×6 IMPLANT
PACK VAGINAL MINOR WOMEN LF (CUSTOM PROCEDURE TRAY) ×3 IMPLANT
PAD OB MATERNITY 4.3X12.25 (PERSONAL CARE ITEMS) ×3 IMPLANT
SET TUBING HYSTEROSCOPY 2 NDL (TUBING) IMPLANT
TOWEL OR 17X24 6PK STRL BLUE (TOWEL DISPOSABLE) ×6 IMPLANT
TUBE HYSTEROSCOPY W Y-CONNECT (TUBING) IMPLANT
WATER STERILE IRR 1000ML POUR (IV SOLUTION) ×3 IMPLANT

## 2014-03-04 NOTE — Anesthesia Procedure Notes (Addendum)
Performed by: Jonna Munro    Procedure Name: LMA Insertion Date/Time: 03/04/2014 2:26 PM Performed by: Jonna Munro Pre-anesthesia Checklist: Patient identified, Emergency Drugs available, Suction available, Timeout performed and Patient being monitored Patient Re-evaluated:Patient Re-evaluated prior to inductionOxygen Delivery Method: Circle system utilized Preoxygenation: Pre-oxygenation with 100% oxygen Intubation Type: IV induction    Procedure Name: LMA Insertion Date/Time: 03/04/2014 2:26 PM Performed by: Jonna Munro Pre-anesthesia Checklist: Suction available, Emergency Drugs available, Timeout performed, Patient identified and Patient being monitored Patient Re-evaluated:Patient Re-evaluated prior to inductionOxygen Delivery Method: Circle system utilized Preoxygenation: Pre-oxygenation with 100% oxygen Intubation Type: IV induction LMA: LMA inserted Number of attempts: 2 Placement Confirmation: positive ETCO2 and breath sounds checked- equal and bilateral Tube secured with: Tape Comments: LMA inserted after induction, no ET CO2 noted, LMA removed. Upon removal patient had laryngospasm, anectine 40 mg given, LMA reinserted without difficulty, ETCO2, BBS, chest rise noted.

## 2014-03-04 NOTE — Transfer of Care (Signed)
Immediate Anesthesia Transfer of Care Note  Patient: Gail Lynch  Procedure(s) Performed: Procedure(s): DILATATION AND CURETTAGE /HYSTEROSCOPY (N/A)  Patient Location: PACU  Anesthesia Type:General  Level of Consciousness: awake, alert  and oriented  Airway & Oxygen Therapy: Patient Spontanous Breathing and Patient connected to nasal cannula oxygen  Post-op Assessment: Report given to PACU RN and Post -op Vital signs reviewed and stable  Post vital signs: Reviewed and stable  Complications: No apparent anesthesia complications

## 2014-03-04 NOTE — H&P (Signed)
Patient ID: Gail Lynch, female DOB: 06/24/1941, 73 y.o. MRN: [id number]  HPI Pt presents for operative evaluation of continued endometrial thickening. She denies problems. She reports that she had an episode of one day of spotting PRIOR to May but, it was scant and has not returned. She denies pelvic pain.   Past Medical History  Diagnosis Date  . Hypertension   . Hard of hearing     hear aids - left  . Dysplasia of cervix, low grade (CIN 1)     s/p cervical conization  . HPV (human papilloma virus) infection   . Glaucoma   . Hyperlipidemia   . Colon polyps   . Glaucoma 12/18/2011  . Carotid stenosis 06/18/2012  . PONV (postoperative nausea and vomiting)   . Mycotic toenails 02/19/2013  . SVD (spontaneous vaginal delivery)     x 4  . Asthma     hx as a child - no inhaler, no problems  . Intrinsic asthma, unspecified 12/18/2011  . Diabetes mellitus     Type 2  . Arthritis     gout in feet   Past Surgical History  Procedure Laterality Date  . Knee surgery  2010    patella fracture  . Dilation and curettage of uterus  2000  . Breast biopsy  2010  . Breast biopsy  1991  . Multiple tooth extractions     No current facility-administered medications on file prior to encounter.   Current Outpatient Prescriptions on File Prior to Encounter  Medication Sig Dispense Refill  . atorvastatin (LIPITOR) 40 MG tablet Take 1 tablet (40 mg total) by mouth daily.  90 tablet  3  . Cholecalciferol (VITAMIN D3) 1000 UNITS CAPS Take 1 capsule by mouth daily.      . fish oil-omega-3 fatty acids 1000 MG capsule Take 2 g by mouth daily.      . Flaxseed, Linseed, (FLAX SEED OIL) 1000 MG CAPS Take 2 capsules by mouth daily.       . Garlic 7867 MG CAPS Take 1 capsule by mouth daily.      Marland Kitchen glucose blood (ONE TOUCH ULTRA TEST) test strip 1 each by Other route 2 (two) times daily. And lancets 2/day 250.01  180 each  3  . Polyethylene Glycol 3350 (MIRALAX PO) Take 17 g by mouth daily as needed.       .  pyridoxine ([employer] VITAMIN B-6) 200 MG tablet Take 200 mg by mouth daily.      . valsartan-hydrochlorothiazide (DIOVAN-HCT) 160-12.5 MG per tablet TAKE ONE TABLET BY MOUTH EVERY DAY  90 tablet  3  . vitamin B-12 (CYANOCOBALAMIN) 500 MCG tablet Take 500 mcg by mouth daily.       Marland Kitchen allopurinol (ZYLOPRIM) 300 MG tablet Take 0.5 tablets (150 mg total) by mouth daily.  45 tablet  3   History   Social History  . Marital Status: Widowed    Spouse Name: N/A    Number of Children: 3  . Years of Education: N/A   Occupational History  . retired    Social History Main Topics  . Smoking status: Former Smoker    Types: Cigarettes    Quit date: 08/21/1980  . Smokeless tobacco: Former Systems developer  . Alcohol Use: No  . Drug Use: No  . Sexual Activity: Yes    Birth Control/ Protection: Post-menopausal   Other Topics Concern  . Not on file   Social History Narrative  . No narrative on file  Allergies  Allergen Reactions  . Aspirin Other (See Comments)    BLEEDING BEHIND EYES  . Penicillins    \    Review of Systems  Objective:   Physical Exam Pt in NAD  BP 119/63  Pulse 75  Temp(Src) 98.7 F (37.1 C)  Ht 5\' 3"  (1.6 m)  Wt 214 lb 14.4 oz (97.478 kg)  BMI 38.08 kg/m2  Abd: obese, NT, ND  GU:  EGBUS: there is a tag or condyloma on her anus  Vagina: no blood in vault  Cervix: no lesion; no mucopurulent d/c  Uterus: small, mobile  Adnexa: no masses; non tender (difficult to assess due to body habitus)  08/2012 *RADIOLOGY REPORT*  Clinical Data: Postmenopausal vaginal bleeding. Remote dilatation  and curettage. Thickened endometrium on CT 07/27/2012  TRANSABDOMINAL AND TRANSVAGINAL ULTRASOUND OF PELVIS  Technique: Both transabdominal and transvaginal ultrasound  examinations of the pelvis were performed. Transabdominal  technique was performed for global imaging of the pelvis including  uterus, ovaries, adnexal regions, and pelvic cul-de-sac.  It was necessary to proceed with  endovaginal exam following the  transabdominal exam to visualize the uterus and ovaries.  Comparison: CT 07/27/2012  Findings:  Uterus: Anteverted, anteflexed. 9.7 x 6.7 x 5.4 cm. No focal  abnormality.  Endometrium: 2.0 cm, uniformly inhomogeneously echogenic and  thickened with internal cystic spaces.  Right ovary: 2.4 x 2.5 x 1.6 cm. Not well visualized due to  overlying bowel but no focal abnormality.  Left ovary: 2.6 x 1.5 x 1.5 cm. Normal.  Other Findings: No free fluid.  IMPRESSION:  Normal endometrial thickening with internal cystic spaces.  Differential considerations include endometrial hyperplasia,  neoplasia, or polyp. In the setting of post-menopausal bleeding,  endometrial sampling is indicated to exclude carcinoma. If results  are benign, sonohysterogram should be considered for focal lesion  work-up. (Ref: Radiological Reasoning: Algorithmic Workup of  Abnormal Vaginal Bleeding with Endovaginal Sonography and  Sonohysterography. AJR 2008; 540:G86-76)  09/04/2012  Diagnosis  Endometrium, biopsy  - SMALL FRAGMENT OF BLOOD CLOT, NON-DIAGNOSTIC.   CBC    Component Value Date/Time   WBC 7.9 02/27/2014 0840   RBC 4.07 02/27/2014 0840   HGB 11.7* 02/27/2014 0840   HCT 35.8* 02/27/2014 0840   PLT 283 02/27/2014 0840   MCV 88.0 02/27/2014 0840   MCH 28.7 02/27/2014 0840   MCHC 32.7 02/27/2014 0840   RDW 14.9 02/27/2014 0840   LYMPHSABS 2.5 12/05/2013 0903   MONOABS 0.7 12/05/2013 0903   EOSABS 0.2 12/05/2013 0903   BASOSABS 0.0 12/05/2013 0903      Assessment:   Post menopausal bleeding-continued  thickened endometrium but, neg biopsy. Needs D&C Plan:   Patient desires surgical management with hysteroscopy with dilation and currettage.  The risks of surgery were discussed in detail with the patient including but not limited to: bleeding which may require transfusion or reoperation; infection which may require prolonged hospitalization or re-hospitalization and antibiotic  therapy; injury to bowel, bladder, ureters and major vessels or other surrounding organs; need for additional procedures including laparotomy; thromboembolic phenomenon, incisional problems and other postoperative or anesthesia complications.  Patient was told that the likelihood that her condition and symptoms will be treated effectively with this surgical management was very high; the postoperative expectations were also discussed in detail. The patient also understands the alternative treatment options which were discussed in full. All questions were answered.  She was told that she will be contacted by our surgical scheduler regarding the time and date  of her surgery; routine preoperative instructions of having nothing to eat or drink after midnight on the day prior to surgery and also coming to the hospital 1 1/2 hours prior to her time of surgery were also emphasized.  She was told she may be called for a preoperative appointment about a week prior to surgery and will be given further preoperative instructions at that visit. Printed patient education handouts about the procedure were given to the patient to review at home.

## 2014-03-04 NOTE — Anesthesia Postprocedure Evaluation (Signed)
  Anesthesia Post-op Note  Patient: ARRYN TERRONES  Procedure(s) Performed: Procedure(s): DILATATION AND CURETTAGE /HYSTEROSCOPY (N/A)  Patient Location: PACU  Anesthesia Type:General  Level of Consciousness: awake, alert  and oriented  Airway and Oxygen Therapy: Patient Spontanous Breathing  Post-op Pain: mild  Post-op Assessment: Post-op Vital signs reviewed, Patient's Cardiovascular Status Stable, Respiratory Function Stable, Patent Airway, No signs of Nausea or vomiting and Pain level controlled  Post-op Vital Signs: Reviewed and stable  Last Vitals:  Filed Vitals:   03/04/14 1630  BP: 168/67  Pulse: 78  Temp: 36.7 C  Resp: 14    Complications: No apparent anesthesia complications

## 2014-03-04 NOTE — Anesthesia Preprocedure Evaluation (Signed)
Anesthesia Evaluation  Patient identified by MRN, date of birth, ID band Patient awake    Reviewed: Allergy & Precautions, H&P , Patient's Chart, lab work & pertinent test results, reviewed documented beta blocker date and time   Airway Mallampati: II TM Distance: >3 FB Neck ROM: full    Dental no notable dental hx. (+) Edentulous Upper   Pulmonary former smoker,  breath sounds clear to auscultation  Pulmonary exam normal       Cardiovascular hypertension, Rhythm:regular Rate:Normal     Neuro/Psych    GI/Hepatic   Endo/Other  diabetes  Renal/GU      Musculoskeletal   Abdominal   Peds  Hematology   Anesthesia Other Findings      Hypertension     Controlled, No hx of CADS Sx. EKG reviewed   Hard of hearing  Heard me well today  Glaucoma        Carotid stenosis  DM Type II ; BS on arrival 134 MO Asthma   hx as a child - no inhaler, no problems       Reproductive/Obstetrics                           Anesthesia Physical Anesthesia Plan  ASA: III  Anesthesia Plan:    Post-op Pain Management:    Induction: Intravenous  Airway Management Planned: LMA  Additional Equipment:   Intra-op Plan:   Post-operative Plan:   Informed Consent: I have reviewed the patients History and Physical, chart, labs and discussed the procedure including the risks, benefits and alternatives for the proposed anesthesia with the patient or authorized representative who has indicated his/her understanding and acceptance.   Dental Advisory Given and Dental advisory given  Plan Discussed with: CRNA and Surgeon  Anesthesia Plan Comments: (Discussed GA with LMA, possible sore throat, potential need to switch to ETT, N/V, pulmonary aspiration. Questions answered. )        Anesthesia Quick Evaluation

## 2014-03-04 NOTE — Op Note (Signed)
03/04/2014  4:29 PM  PATIENT:  Gail Lynch  73 y.o. female  PRE-OPERATIVE DIAGNOSIS:  Post menopausal bleeding - prev had thickened endomettium but, neg biopsy  POST-OPERATIVE DIAGNOSIS:  Post menopausal bleeding - prev had thickened endomettium bu  PROCEDURE:  Procedure(s): DILATATION AND CURETTAGE /HYSTEROSCOPY (N/A)  SURGEON:  Surgeon(s) and Role:    * Lavonia Drafts, MD - Primary  ANESTHESIA:   general  EBL:  Total I/O In: 900 [I.V.:900] Out: 25 [Blood:25]  BLOOD ADMINISTERED:none  DRAINS: none   LOCAL MEDICATIONS USED:  MARCAINE     SPECIMEN:  Source of Specimen:  endometrial currettings  DISPOSITION OF SPECIMEN:  PATHOLOGY  COUNTS:  YES  TOURNIQUET:  * No tourniquets in log *  DICTATION: .Note written in EPIC  PLAN OF CARE: Discharge to home after PACU  PATIENT DISPOSITION:  PACU - hemodynamically stable.   Delay start of Pharmacological VTE agent (>24hrs) due to surgical blood loss or risk of bleeding: yes  Complications: Suspected uterine perforation  INDICATIONS: 73 y.o. [id number]  here for scheduled surgery for hysteroscopy with dilation and curettage.   Risks of surgery were discussed with the patient including but not limited to: bleeding which may require transfusion; infection which may require antibiotics; injury to uterus or surrounding organs; intrauterine scarring which may impair future fertility; need for additional procedures including laparotomy or laparoscopy; and other postoperative/anesthesia complications. Written informed consent was obtained.    FINDINGS:  Uterine size difficult to determine due to body habitus. Uterus sounds to greater than 12cm.   Initially the cervix was noted to be stenotic.  Attempted to dilate but, perforation noted into endometrial cavity.     PROCEDURE DETAILS:  The patient was taken to the operating room where general anesthesia was administered with LMA and was found to be adequate.  After an adequate  timeout was performed, she was placed in the dorsal lithotomy position and examined; then prepped and draped in the sterile manner.   Her bladder was catheterized for an unmeasured amount of clear, yellow urine. A speculum was then placed in the patient's vagina and a single tooth tenaculum was applied to the anterior lip of the cervix.   The cervix was found to be stenotic.  An attempt was made to dilate the cervix and dilate manually with metal dilators to accommodate the 5 mm diagnostic hysteroscope.  Once the cervix was dilated, the hysteroscope was inserted under direct visualization using 1.5% glycine as a suspension medium.  The initial view appeared to be of the uterine cavity but, the biopsy appeared to be of fatty tissue.  A subsequent view appeared to confirm a uterine perforation into the peritoneal cavity. The tenaculum was removed from the anterior lip of the cervix and the vaginal speculum was removed after noting good hemostasis.  The patient tolerated the procedure well and was taken to the recovery area awake, extubated and in stable condition.  The patient was evaluated post op and was found to have no pain or bleeding.  Pt will be admitted to the hospital for overnight observations.  A CT scan of the abd and pelvis were obtained to evaluate the endometrial stripe and r/o additional injury.  Tien Aispuro L. Harraway-Smith, M.D., [facility]

## 2014-03-04 NOTE — Brief Op Note (Signed)
03/04/2014  4:29 PM  PATIENT:  Gail Lynch  73 y.o. female  PRE-OPERATIVE DIAGNOSIS:  Post menopausal bleeding - prev had thickened endomettium but, neg biopsy  POST-OPERATIVE DIAGNOSIS:  Post menopausal bleeding - prev had thickened endomettium bu  PROCEDURE:  Procedure(s): DILATATION AND CURETTAGE /HYSTEROSCOPY (N/A)  SURGEON:  Surgeon(s) and Role:    * Lavonia Drafts, MD - Primary  ANESTHESIA:   general  EBL:  Total I/O In: 900 [I.V.:900] Out: 25 [Blood:25]  BLOOD ADMINISTERED:none  DRAINS: none   LOCAL MEDICATIONS USED:  MARCAINE     SPECIMEN:  Source of Specimen:  endometrial currettings  DISPOSITION OF SPECIMEN:  PATHOLOGY  COUNTS:  YES  TOURNIQUET:  * No tourniquets in log *  DICTATION: .Note written in EPIC  PLAN OF CARE: Discharge to home after PACU  PATIENT DISPOSITION:  PACU - hemodynamically stable.   Delay start of Pharmacological VTE agent (>24hrs) due to surgical blood loss or risk of bleeding: yes

## 2014-03-05 ENCOUNTER — Encounter (HOSPITAL_COMMUNITY): Payer: Self-pay | Admitting: Obstetrics & Gynecology

## 2014-03-05 DIAGNOSIS — N950 Postmenopausal bleeding: Secondary | ICD-10-CM | POA: Diagnosis not present

## 2014-03-05 LAB — CBC
HCT: 33.2 % — ABNORMAL LOW (ref 36.0–46.0)
Hemoglobin: 10.9 g/dL — ABNORMAL LOW (ref 12.0–15.0)
MCH: 28.9 pg (ref 26.0–34.0)
MCHC: 32.8 g/dL (ref 30.0–36.0)
MCV: 88.1 fL (ref 78.0–100.0)
Platelets: 286 10*3/uL (ref 150–400)
RBC: 3.77 MIL/uL — ABNORMAL LOW (ref 3.87–5.11)
RDW: 14.8 % (ref 11.5–15.5)
WBC: 9.5 10*3/uL (ref 4.0–10.5)

## 2014-03-05 LAB — GLUCOSE, CAPILLARY
Glucose-Capillary: 241 mg/dL — ABNORMAL HIGH (ref 70–99)
Glucose-Capillary: 265 mg/dL — ABNORMAL HIGH (ref 70–99)

## 2014-03-05 MED ORDER — INSULIN GLARGINE 100 UNIT/ML ~~LOC~~ SOLN
40.0000 [IU] | Freq: Every day | SUBCUTANEOUS | Status: DC
  Administered 2014-03-05: 40 [IU] via SUBCUTANEOUS
  Filled 2014-03-05: qty 0.4

## 2014-03-05 MED ORDER — INSULIN ASPART 100 UNIT/ML ~~LOC~~ SOLN
0.0000 [IU] | Freq: Three times a day (TID) | SUBCUTANEOUS | Status: DC
  Administered 2014-03-05: 3 [IU] via SUBCUTANEOUS

## 2014-03-05 MED ORDER — INSULIN ASPART 100 UNIT/ML ~~LOC~~ SOLN: 0.0000 [IU] | Freq: Every day | SUBCUTANEOUS | Status: DC

## 2014-03-05 NOTE — Progress Notes (Signed)
Pt is discharged in the care of daughter,with R.N. Escort. Denies any pain ,bleeding ,or discomfort.Stable. Discharge instructions withRx were given to pt with good understanding Questions were asked and answer. No equipment neede for home use.

## 2014-03-05 NOTE — Anesthesia Postprocedure Evaluation (Signed)
  Anesthesia Post-op Note  Anesthesia Post Note  Patient: Gail Lynch  Procedure(s) Performed: Procedure(s) (LRB): DILATATION AND CURETTAGE /HYSTEROSCOPY (N/A)  Anesthesia type: General  Patient location: [facility]  Post pain: Pain level controlled  Post assessment: Post-op Vital signs reviewed  Last Vitals:  Filed Vitals:   03/05/14 0451  BP: 137/73  Pulse: 87  Temp: 36.8 C  Resp: 16    Post vital signs: Reviewed  Level of consciousness: sedated  Complications: No apparent anesthesia complications

## 2014-03-05 NOTE — Addendum Note (Signed)
Addendum created 03/05/14 0805 by Billie Lade, CRNA   Modules edited: Notes Section   Notes Section:  File: [id number]

## 2014-03-05 NOTE — Progress Notes (Signed)
Inpatient Diabetes Program Recommendations  AACE/ADA: New Consensus Statement on Inpatient Glycemic Control (2013)  Target Ranges:  Prepandial:   less than 140 mg/dL      Peak postprandial:   less than 180 mg/dL (1-2 hours)      Critically ill patients:  140 - 180 mg/dL   Results for Gail Lynch, HEPP (MRN [id number]) as of 03/05/2014 07:48  Ref. Range 03/04/2014 13:42 03/04/2014 16:22 03/04/2014 22:16 03/05/2014 00:39  Glucose-Capillary Latest Range: 70-99 mg/dL 138 (H) 158 (H) 241 (H) 265 (H)   Diabetes history: DM2 Outpatient Diabetes medications: Humulin 70/30 80 units with breakfast, Humulin 70/30 5-10 units at bedtime if needed Current orders for Inpatient glycemic control: NONE  Inpatient Diabetes Program Recommendations Insulin - Basal: Please consider ordering Lantus 40 units daily (starting now). Correction (SSI): Please order CBGs and Novolog sensitive correction scale ACHS (using Glycemic Control order set). Diet: Please change diet to Carb Modified Diabetic diet.  Note: Noted on home medication list patient takes 70/30.  However, patient is followed by Dr. Loanne Drilling for diabetes management and his office note on 01/13/14 has Humulin N(NPH) 80 units with breakfast, and Humulin N (NPH) 5 units in the evening if needed as being prescribed for diabetes management. Called patient over the phone to clarify insulin she is taking at home.  Patient states that she is taking Humulin 70/30 as noted on the home medication list in the chart. While inpatient, please order Lantus 40 units daily, CBGs with Novolog sensitive correction scale ACHS, and change diet to carb modified diabetic diet.  Thanks, Barnie Alderman, RN, MSN, CCRN Diabetes Coordinator Inpatient Diabetes Program [phone] (Team Pager) [phone] ([facility] office) [phone] [facility] office)

## 2014-03-05 NOTE — Progress Notes (Signed)
Patient's HS CBG was 241. Dr Elonda Husky paged and notified of result, and gave orders to give 10 units of novolog SQ. Dose was given and patient had snack before bed. CBG was rechecked around 0030 and was 265. Dr. Elonda Husky paged and notified of results. No new orders given at this time. Patient in stable condition, with family at bedside. Will continue to monitor patient.

## 2014-03-05 NOTE — Discharge Instructions (Signed)
DISCHARGE INSTRUCTIONS: HYSTEROSCOPY / ENDOMETRIAL ABLATION The following instructions have been prepared to help you care for yourself upon your return home.  Personal hygiene:  Use sanitary pads for vaginal drainage, not tampons.  Shower the day after your procedure.  NO tub baths, pools or Jacuzzis for 2-3 weeks.  Wipe front to back after using the bathroom.  Activity and limitations:  Do NOT drive or operate any equipment for 24 hours. The effects of anesthesia are still present and drowsiness may result.  Do NOT rest in bed all day.  Walking is encouraged.  Walk up and down stairs slowly.  You may resume your normal activity in one to two days or as indicated by your physician. Sexual activity: NO intercourse for at least 2 weeks after the procedure, or as indicated by your Doctor.  Diet: Eat a light meal as desired this evening. You may resume your usual diet tomorrow.  Return to Work: You may resume your work activities in one to two days or as indicated by Marine scientist.  What to expect after your surgery: Expect to have vaginal bleeding/discharge for 2-3 days and spotting for up to 10 days. It is not unusual to have soreness for up to 1-2 weeks. You may have a slight burning sensation when you urinate for the first day. Mild cramps may continue for a couple of days. You may have a regular period in 2-6 weeks.  Call your doctor for any of the following:  Excessive vaginal bleeding or clotting, saturating and changing one pad every hour.  Inability to urinate 6 hours after discharge from hospital.  Pain not relieved by pain medication.  Fever of 100.4 F or greater.  Unusual vaginal discharge or odor.  Post Anesthesia Care Unit [phone], Care After Refer to this sheet in the next few weeks. These instructions provide you with information on caring for yourself after your procedure. Your health care provider may also give you more specific  instructions. Your treatment has been planned according to current medical practices, but problems sometimes occur. Call your health care provider if you have any problems or questions after your procedure.  WHAT TO EXPECT AFTER THE PROCEDURE After your procedure, it is typical to have the following:  You may have some cramping. This normally lasts for a couple days.  You may have bleeding. This can vary from light spotting for a few days to menstrual-like bleeding for 3-7 days. HOME CARE INSTRUCTIONS  Rest for the first 1-2 days after the procedure.  Only take over-the-counter or prescription medicines as directed by your health care provider. Do not take aspirin. It can increase the chances of bleeding.  Take showers instead of baths for 2 weeks or as directed by your health care provider.  Do not drive for 24 hours or as directed.  Do not drink alcohol while taking pain medicine.  Do not use tampons, douche, or have sexual intercourse for 2 weeks or until your health care provider says it is okay.  Take your temperature twice a day for 4-5 days. Write it down each time.  Follow your health care provider's advice about diet, exercise, and lifting.  If you develop constipation, you may:  Take a mild laxative if your health care provider approves.  Add bran foods to your diet.  Drink enough fluids to keep your urine clear or pale yellow.  Try to have someone with you or available to you for the first 24-48 hours, especially if you were  given a general anesthetic.  Follow up with your health care provider as directed. SEEK MEDICAL CARE IF:  You feel dizzy or lightheaded.  You feel sick to your stomach (nauseous).  You have abnormal vaginal discharge.  You have a rash.  You have pain that is not controlled with medicine. SEEK IMMEDIATE MEDICAL CARE IF:  You have bleeding that is heavier than a normal menstrual period.  You have a fever.  You have increasing cramps  or pain, not controlled with medicine.  You have new belly (abdominal) pain.  You pass out.  You have pain in the tops of your shoulders (shoulder strap areas).  You have shortness of breath. Document Released: 05/28/2013 Document Reviewed: 05/28/2013 [facility] Patient Information 2015 [place], [place]. This information is not intended to replace advice given to you by your health care provider. Make sure you discuss any questions you have with your health care provider.

## 2014-03-07 ENCOUNTER — Telehealth: Payer: Self-pay | Admitting: Obstetrics & Gynecology

## 2014-03-07 NOTE — Discharge Summary (Signed)
Physician Discharge Summary  Patient ID: Gail Lynch MRN: [id number] DOB/AGE: Mar 21, 1941 73 y.o.  Admit date: 03/04/2014 Discharge date: 03/07/2014  Admission Diagnoses: Post menopausal bleeding  Discharge Diagnoses: Post menopausal bleeding; uterine perforation Active Problems:   Post-operative state   Discharged Condition: good  Hospital Course: Pt was admitted for observation after a hysteroscopy complicated with uterine perforation. Pt denies fever, chills, nausea or emesis.  She has tolerated a regular diet and want to be discharged.  She is voiding and passing flatus without difficulty.  She reports that her glucose range is 'normal for her'.  Her daughters were present for this exam and visit. Pt denies vaginal bleeding or discharge.    Consults: None  Significant Diagnostic Studies: labs: CBC and radiology: CT scan: abd and pelvis  Treatments: surgery: hysteroscopy complicated with uterine perforation  Discharge Exam: Blood pressure 129/57, pulse 72, temperature 97.4 F (36.3 C), temperature source Oral, resp. rate 18, height 5\' 3"  (1.6 m), weight 215 lb (97.523 kg), SpO2 98.00%. General appearance: alert and no distress Resp: clear to auscultation bilaterally Cardio: regular rate and rhythm, S1, S2 normal, no murmur, click, rub or gallop GI: soft, non-tender; bowel sounds normal; no masses,  no organomegaly Extremities: extremities normal, atraumatic, no cyanosis or edema  CBC    Component Value Date/Time   WBC 9.5 03/05/2014 0520   RBC 3.77* 03/05/2014 0520   HGB 10.9* 03/05/2014 0520   HCT 33.2* 03/05/2014 0520   PLT 286 03/05/2014 0520   MCV 88.1 03/05/2014 0520   MCH 28.9 03/05/2014 0520   MCHC 32.8 03/05/2014 0520   RDW 14.8 03/05/2014 0520   LYMPHSABS 2.5 12/05/2013 0903   MONOABS 0.7 12/05/2013 0903   EOSABS 0.2 12/05/2013 0903   BASOSABS 0.0 12/05/2013 0903   03/04/2014 CLINICAL DATA: Pelvic pain following hysteroscopy procedure  EXAM:  CT ABDOMEN AND PELVIS  WITH CONTRAST  TECHNIQUE:  Multidetector CT imaging of the abdomen and pelvis was performed  using the standard protocol following bolus administration of  intravenous contrast.  CONTRAST: 163mL OMNIPAQUE IOHEXOL 300 MG/ML SOLN  COMPARISON: None.  FINDINGS:  There is patchy airspace consolidation in both lung bases. There is  a small hiatal hernia.  There is moderate ascites throughout the abdomen and pelvis. Fluid  surrounds the uterus ; the fluid surrounding the uterus is  loculated. There are several foci of free air in the anterior  abdomen. These findings are concerning for uterine perforation. An  actual focus of uterine perforation is not seen, however.  No focal liver lesions are identified. The gallbladder wall is not  thickened. There is no biliary duct dilatation.  Spleen, pancreas, and adrenals appear normal. There is a 1 x 1 cm  cyst in the anterior upper to mid left kidney. There is a partially  duplicated collecting system on the right. There is no noncystic  renal mass on either side. There is no hydronephrosis in either  kidney. There is no ureteral calculus or ureterectasis on either  side.  In the pelvis, the urinary bladder is midline. There is no pelvic  mass. There are sigmoid diverticula without diverticulitis. There is  no thickening of pelvic bowel wall. Appendix appears normal. The  endometrium in the uterus appears thickened.  There is no bowel obstruction. There is no portal venous air.  There is no adenopathy or abscess in the abdomen or pelvis. Aorta is  nonaneurysmal but does show atherosclerotic change. There is age  uncertain anterior wedging of the  T12 vertebral body with marked  disc space narrowing at T11-12. There is a ventral hernia containing  only fat. There is ligamentous calcification lateral to each  superior iliac crest.  IMPRESSION:  Moderate ascites. Loculated fluid surrounds the uterus. There are  foci of free intraperitoneal air  within a ventral hernia as well as  in the anterior mid abdomen. These are findings consistent with  uterine perforation. A focal site of uterine perforation is not  seen, however. The endometrium is thickened. No obvious bowel  perforation is seen. Conceivably, pneumoperitoneum could be  secondary to bowel perforation which could conceivably be present as  well as uterine perforation. Close clinical surveillance in this  regard advised.  There is infiltrate in both lung bases. There is a small hiatal  hernia. There is a ventral hernia containing only fat.  There is an age uncertain compression fracture of the T12 vertebral  body with disc space narrowing at T11-12.  Critical Value/emergent results were called by telephone at the time  of interpretation on 03/04/2014 at 8:09 pm to Dr. Elonda Husky the covering  gynecologist, who verbally acknowledged these results.     Disposition: 01-Home or Self Care  Discharge Instructions   Call MD for:  difficulty breathing, headache or visual disturbances    Complete by:  As directed      Call MD for:  extreme fatigue    Complete by:  As directed      Call MD for:  hives    Complete by:  As directed      Call MD for:  persistant dizziness or light-headedness    Complete by:  As directed      Call MD for:  persistant nausea and vomiting    Complete by:  As directed      Call MD for:  redness, tenderness, or signs of infection (pain, swelling, redness, odor or green/yellow discharge around incision site)    Complete by:  As directed      Call MD for:  severe uncontrolled pain    Complete by:  As directed      Call MD for:  temperature >100.4    Complete by:  As directed      Call MD for:    Complete by:  As directed   Call MD for any excessive bleeding or any foul smelling discharge     Diet - low sodium heart healthy    Complete by:  As directed      Diet Carb Modified    Complete by:  As directed      Increase activity slowly    Complete by:   As directed      Sexual Activity Restrictions    Complete by:  As directed   Nothing in vagina for 4 weeks            Medication List         acetaminophen 500 MG tablet  Commonly known as:  TYLENOL  Take 1,000 mg by mouth every 6 (six) hours as needed.     allopurinol 300 MG tablet  Commonly known as:  ZYLOPRIM  Take 0.5 tablets (150 mg total) by mouth daily.     atorvastatin 40 MG tablet  Commonly known as:  LIPITOR  Take 1 tablet (40 mg total) by mouth daily.     COLON CLEANSER PO  Take by mouth.     [employer] VITAMIN B-6 200 MG tablet  Generic drug:  pyridoxine  Take 200 mg by mouth daily.     fish oil-omega-3 fatty acids 1000 MG capsule  Take 2 g by mouth daily.     Flax Seed Oil 1000 MG Caps  Take 2 capsules by mouth daily.     Garlic 7425 MG Caps  Take 1 capsule by mouth daily.     glucose blood test strip  Commonly known as:  ONE TOUCH ULTRA TEST  1 each by Other route 2 (two) times daily. And lancets 2/day 250.01     insulin NPH-regular Human (70-30) 100 UNIT/ML injection  Commonly known as:  NOVOLIN 70/30  80 units with breakfast, and 10 units with the evening if needed, and syringes 2/day     MIRALAX PO  Take 17 g by mouth daily as needed.     valsartan-hydrochlorothiazide 160-12.5 MG per tablet  Commonly known as:  DIOVAN-HCT  TAKE ONE TABLET BY MOUTH EVERY DAY     vitamin B-12 500 MCG tablet  Commonly known as:  CYANOCOBALAMIN  Take 500 mcg by mouth daily.     Vitamin D3 1000 UNITS Caps  Take 1 capsule by mouth daily.           Follow-up Information   Follow up with [facility], Reika Callanan, MD In 1 week.   Specialty:  Obstetrics and Gynecology   Contact information:   [place] [address] [phone]      Emphasized to pt and her daughters the importance of following up in the ED ASAP if pt developed any adverse sx specifically GI sx or fever and chills. They expressed understanding.   HARRAWAY-SMITH,  Brinden Kincheloe 03/07/2014, 9:14 AM

## 2014-03-07 NOTE — Telephone Encounter (Signed)
T.C. To pt to check up on post op.  Pt denies problems. No abd pain.  She is eating and drinking without difficulty.  No N/V.  No fever or chills.  She is to f/u in ofc in 1-2 weeks. Pt encouraged to call if she develops any adverse sx.  Emonnie Cannady L. Harraway-Smith, M.D., Cherlynn June

## 2014-03-11 ENCOUNTER — Encounter: Payer: Self-pay | Admitting: Obstetrics & Gynecology

## 2014-03-11 ENCOUNTER — Ambulatory Visit (INDEPENDENT_AMBULATORY_CARE_PROVIDER_SITE_OTHER): Payer: Medicare Other | Admitting: Obstetrics & Gynecology

## 2014-03-11 VITALS — BP 124/50 | HR 65 | Temp 98.6°F | Wt 213.7 lb

## 2014-03-11 DIAGNOSIS — Z09 Encounter for follow-up examination after completed treatment for conditions other than malignant neoplasm: Secondary | ICD-10-CM

## 2014-03-11 DIAGNOSIS — R9389 Abnormal findings on diagnostic imaging of other specified body structures: Secondary | ICD-10-CM

## 2014-03-11 NOTE — Progress Notes (Signed)
Subjective:     Patient ID: Gail Lynch, female   DOB: 1940-11-22, 73 y.o.   MRN: [id number]  HPI pt present for f/u after uterine perforation.  Pt denies f/c/n/v.  She reports not feeling ANY different than prior to her procedure.     Review of Systems     Objective:   Physical Exam BP 124/50  Pulse 65  Temp(Src) 98.6 F (37 C)  Wt 213 lb 11.2 oz (96.934 kg) Pt in NAD Exam deferred  02/11/2014 CLINICAL DATA: Postmenopausal bleeding  EXAM:  TRANSABDOMINAL AND TRANSVAGINAL ULTRASOUND OF PELVIS  TECHNIQUE:  Both transabdominal and transvaginal ultrasound examinations of the  pelvis were performed. Transabdominal technique was performed for  global imaging of the pelvis including uterus, ovaries, adnexal  regions, and pelvic cul-de-sac. It was necessary to proceed with  endovaginal exam following the transabdominal exam to visualize the  endometrium and ovaries.  COMPARISON: None  FINDINGS:  Uterus  Measurements: 8.7 x 5.3 x 6.2 cm. Calcifications identified within  the myometrium.  Endometrium  Thickness: 18.5 mm. Small cystic areas are noted throughout the  hyperplastic endometrium.  Right ovary  Measurements: 1.33 x 1.10 x 0.96 cm. Normal appearance/no adnexal  mass.  Left ovary  Measurements: 1.1 x 1.37 x 0.68. Normal appearance/no adnexal mass.  Other findings  No free fluid.  IMPRESSION:  1. Abnormal, increased thickness of the endometrium measuring 18.5  mm. In the setting of post-menopausal bleeding, endometrial sampling  is indicated to exclude carcinoma. If results are benign,  sonohysterogram should be considered for focal lesion work-up. (Ref:  Radiological Reasoning: Algorithmic Workup of Abnormal Vaginal  Bleeding with Endovaginal Sonography and Sonohysterography. AJR  2008; 585:I77-82)   03/04/2014 EXAM:  CT ABDOMEN AND PELVIS WITH CONTRAST  TECHNIQUE:  Multidetector CT imaging of the abdomen and pelvis was performed  using the standard protocol  following bolus administration of  intravenous contrast.  CONTRAST: 137mL OMNIPAQUE IOHEXOL 300 MG/ML SOLN  COMPARISON: None.  FINDINGS:  There is patchy airspace consolidation in both lung bases. There is  a small hiatal hernia.  There is moderate ascites throughout the abdomen and pelvis. Fluid  surrounds the uterus ; the fluid surrounding the uterus is  loculated. There are several foci of free air in the anterior  abdomen. These findings are concerning for uterine perforation. An  actual focus of uterine perforation is not seen, however.  No focal liver lesions are identified. The gallbladder wall is not  thickened. There is no biliary duct dilatation.  Spleen, pancreas, and adrenals appear normal. There is a 1 x 1 cm  cyst in the anterior upper to mid left kidney. There is a partially  duplicated collecting system on the right. There is no noncystic  renal mass on either side. There is no hydronephrosis in either  kidney. There is no ureteral calculus or ureterectasis on either  side.  In the pelvis, the urinary bladder is midline. There is no pelvic  mass. There are sigmoid diverticula without diverticulitis. There is  no thickening of pelvic bowel wall. Appendix appears normal. The  endometrium in the uterus appears thickened.  There is no bowel obstruction. There is no portal venous air.  There is no adenopathy or abscess in the abdomen or pelvis. Aorta is  nonaneurysmal but does show atherosclerotic change. There is age  uncertain anterior wedging of the T12 vertebral body with marked  disc space narrowing at T11-12. There is a ventral hernia containing  only fat. There  is ligamentous calcification lateral to each  superior iliac crest.  IMPRESSION:  Moderate ascites. Loculated fluid surrounds the uterus. There are  foci of free intraperitoneal air within a ventral hernia as well as  in the anterior mid abdomen. These are findings consistent with  uterine perforation. A  focal site of uterine perforation is not  seen, however. The endometrium is thickened. No obvious bowel  perforation is seen. Conceivably, pneumoperitoneum could be  secondary to bowel perforation which could conceivably be present as  well as uterine perforation. Close clinical surveillance in this  regard advised.  There is infiltrate in both lung bases. There is a small hiatal  hernia. There is a ventral hernia containing only fat.  There is an age uncertain compression fracture of the T12 vertebral  body with disc space narrowing at T11-12.  Critical Value/emergent results were called by telephone at the time  of interpretation on 03/04/2014 at 8:09 pm to Dr. Elonda Husky the covering  gynecologist, who verbally acknowledged these results.       Assessment:     Thickened endometrium in pt with h/o PMPB that has resolved.  Pt was scheduled for hysteroscopy which was complicated with a uterine perforation.  Pt offered repeat procedure in 1 month vs serial sonos vs hysteerectomy    Plan:     Pt opts to follow endometrial thickening with serial sonograms.  She reports that she will f/u if her bleeding returns.  She is aware that she could have endometrial cancer.   Pts daughter was present for this visit TV sono in 3 months F/u 3 months- if no bleeding and no change on sono can conduct that visit via telephone

## 2014-03-11 NOTE — Patient Instructions (Signed)
Postmenopausal Bleeding  Postmenopausal bleeding is any bleeding a woman has after she has entered into menopause. Menopause is the end of a woman's fertile years. After menopause, a woman no longer ovulates or has menstrual periods.   Postmenopausal bleeding can be caused by various things. Any type of postmenopausal bleeding, even if it appears to be a typical menstrual period, is concerning. This should be evaluated by your health care provider. Any treatment will depend on the cause of the bleeding.  HOME CARE INSTRUCTIONS  Monitor your condition for any changes. The following actions may help to alleviate any discomfort you are experiencing:  · Avoid the use of tampons and douches as directed by your health care provider.   · Change your pads frequently.  · Get regular pelvic exams and Pap tests.  · Keep all follow-up appointments for diagnostic tests as directed by your health care provider.  SEEK MEDICAL CARE IF:   · Your bleeding lasts more than 1 week.  · You have abdominal pain.  · You have bleeding with sexual intercourse.  SEEK IMMEDIATE MEDICAL CARE IF:   · You have a fever, chills, headache, dizziness, muscle aches, and bleeding.  · You have severe pain with bleeding.  · You are passing blood clots.  · You have bleeding and need more than 1 pad an hour.  · You feel faint.  MAKE SURE YOU:  · Understand these instructions.  · Will watch your condition.  · Will get help right away if you are not doing well or get worse.  Document Released: 11/15/2005 Document Revised: 05/28/2013 Document Reviewed: 03/06/2013  [employer]® Patient Information ©2015 [employer]. This information is not intended to replace advice given to you by your health care provider. Make sure you discuss any questions you have with your health care provider.

## 2014-03-12 ENCOUNTER — Ambulatory Visit (INDEPENDENT_AMBULATORY_CARE_PROVIDER_SITE_OTHER): Payer: Medicare Other | Admitting: Podiatrist

## 2014-03-12 VITALS — Resp 16

## 2014-03-12 DIAGNOSIS — E1142 Type 2 diabetes mellitus with diabetic polyneuropathy: Secondary | ICD-10-CM

## 2014-03-12 DIAGNOSIS — E1049 Type 1 diabetes mellitus with other diabetic neurological complication: Secondary | ICD-10-CM

## 2014-03-12 DIAGNOSIS — E1040 Type 1 diabetes mellitus with diabetic neuropathy, unspecified: Secondary | ICD-10-CM

## 2014-03-12 DIAGNOSIS — B351 Tinea unguium: Secondary | ICD-10-CM

## 2014-03-12 DIAGNOSIS — M79676 Pain in unspecified toe(s): Principal | ICD-10-CM

## 2014-03-12 DIAGNOSIS — M79609 Pain in unspecified limb: Secondary | ICD-10-CM

## 2014-03-12 NOTE — Progress Notes (Signed)
Nails and shoe measurement  HPI: Patient presents today for follow up of diabetic foot and nail care. Past medical history, meds, and allergies reviewed. Patient states blood sugar is under fair  control.   Objective:   Objective:  Patients chart is reviewed.  Vascular status reveals pedal pulses noted at  1 out of 4 dp and 0/4  pt bilateral .  Neurological sensation is Normal to [employer] monofilament bilateral at 3/5 sites bilateral.  Dermatological exam reveals  absence of pre ulcerative/ hyperkeratotic lesions.   Toenails are elongated, incurvated, discolored, dystrophic with ingrown deformity present. Moderate edema present today   Assessment: Diabetes with Neuropathy/Angiopathy , Ingrown nail deformity, edema  Plan: Discussed treatment options and alternatives. Debrided nails without complication. She was measured and scanned for diabetic shoes and inserts.  Return appointment recommended at routine intervals of 3 months.   Trudie Buckler, DPM

## 2014-04-10 ENCOUNTER — Encounter: Payer: Self-pay | Admitting: Podiatrist

## 2014-04-10 ENCOUNTER — Ambulatory Visit (INDEPENDENT_AMBULATORY_CARE_PROVIDER_SITE_OTHER): Payer: Medicare Other | Admitting: Podiatrist

## 2014-04-10 VITALS — BP 111/58 | HR 69 | Resp 18

## 2014-04-10 DIAGNOSIS — R609 Edema, unspecified: Secondary | ICD-10-CM

## 2014-04-10 DIAGNOSIS — E1142 Type 2 diabetes mellitus with diabetic polyneuropathy: Secondary | ICD-10-CM

## 2014-04-10 DIAGNOSIS — M1990 Unspecified osteoarthritis, unspecified site: Secondary | ICD-10-CM

## 2014-04-10 DIAGNOSIS — E1049 Type 1 diabetes mellitus with other diabetic neurological complication: Secondary | ICD-10-CM

## 2014-04-10 DIAGNOSIS — E1040 Type 1 diabetes mellitus with diabetic neuropathy, unspecified: Secondary | ICD-10-CM

## 2014-04-10 DIAGNOSIS — M129 Arthropathy, unspecified: Secondary | ICD-10-CM

## 2014-04-10 NOTE — Progress Notes (Signed)
   Subjective:    Patient ID: Gail Lynch, female    DOB: 09-Jan-1941, 73 y.o.   MRN: [id number]  HPI  I AM HERE TO GET MY SHOES    Review of Systems     Objective:   Physical Exam        Assessment & Plan:  Patient presents for dispensing of diabetic shoe she states that they're comfortable and free of defect and she's happy with shoes. She's given instructions for wear and 4 use of the inserts. She'll be seen back for her routine care appointment.

## 2014-04-10 NOTE — Patient Instructions (Signed)

## 2014-04-29 ENCOUNTER — Encounter: Payer: Self-pay | Admitting: Endocrinology

## 2014-04-29 ENCOUNTER — Ambulatory Visit (INDEPENDENT_AMBULATORY_CARE_PROVIDER_SITE_OTHER): Payer: Medicare Other | Admitting: Endocrinology

## 2014-04-29 VITALS — BP 118/56 | HR 88 | Temp 97.5°F | Wt 216.0 lb

## 2014-04-29 DIAGNOSIS — E1065 Type 1 diabetes mellitus with hyperglycemia: Principal | ICD-10-CM

## 2014-04-29 DIAGNOSIS — E1039 Type 1 diabetes mellitus with other diabetic ophthalmic complication: Secondary | ICD-10-CM

## 2014-04-29 LAB — HEMOGLOBIN A1C: Hgb A1c MFr Bld: 9 % — ABNORMAL HIGH (ref 4.6–6.5)

## 2014-04-29 NOTE — Progress Notes (Signed)
Subjective:    Patient ID: Gail Lynch, female    DOB: Jan 22, 1941, 73 y.o.   MRN: [id number]  HPI Pt return for f/u of IDDM (dx'ed 1990, when she presented with burry vision; complicated by sensory neuropathy of the lower extremities and retinopathy; she has chosen a simple BID insulin regimen; she has never had pancreatitis, severe hypoglycemia, or DKA; she takes human insulin, due to cost; she cannot have weight-loss surgery, due to having medicaid).  pt states she feels well in general.  she brings a record of her cbg's which i have reviewed today.  It varies from 78-400, but most are in the 100's.  There is no trend throughout the day.  pt states she feels well in general. Past Medical History  Diagnosis Date  . Hypertension   . Hard of hearing     hear aids - left  . Dysplasia of cervix, low grade (CIN 1)     s/p cervical conization  . HPV (human papilloma virus) infection   . Glaucoma   . Hyperlipidemia   . Colon polyps   . Glaucoma 12/18/2011  . Carotid stenosis 06/18/2012  . PONV (postoperative nausea and vomiting)   . Mycotic toenails 02/19/2013  . SVD (spontaneous vaginal delivery)     x 4  . Asthma     hx as a child - no inhaler, no problems  . Intrinsic asthma, unspecified 12/18/2011  . Diabetes mellitus     Type 2  . Arthritis     gout in feet    Past Surgical History  Procedure Laterality Date  . Knee surgery  2010    patella fracture  . Dilation and curettage of uterus  2000  . Breast biopsy  2010  . Breast biopsy  1991  . Multiple tooth extractions    . Hysteroscopy w/d&c N/A 03/04/2014    Procedure: DILATATION AND CURETTAGE /HYSTEROSCOPY;  Surgeon: Lavonia Drafts, MD;  Location: [facility];  Service: Gynecology;  Laterality: N/A;    History   Social History  . Marital Status: Widowed    Spouse Name: N/A    Number of Children: 3  . Years of Education: N/A   Occupational History  . retired    Social History Main Topics  . Smoking status:  Former Smoker    Types: Cigarettes    Quit date: 08/21/1980  . Smokeless tobacco: Former Systems developer  . Alcohol Use: No  . Drug Use: No  . Sexual Activity: Yes    Birth Control/ Protection: Post-menopausal   Other Topics Concern  . Not on file   Social History Narrative  . No narrative on file    Current Outpatient Prescriptions on File Prior to Visit  Medication Sig Dispense Refill  . acetaminophen (TYLENOL) 500 MG tablet Take 1,000 mg by mouth every 6 (six) hours as needed.      Marland Kitchen allopurinol (ZYLOPRIM) 300 MG tablet Take 0.5 tablets (150 mg total) by mouth daily.  45 tablet  3  . atorvastatin (LIPITOR) 40 MG tablet Take 1 tablet (40 mg total) by mouth daily.  90 tablet  3  . Cholecalciferol (VITAMIN D3) 1000 UNITS CAPS Take 1 capsule by mouth daily.      . fish oil-omega-3 fatty acids 1000 MG capsule Take 2 g by mouth daily.      . Flaxseed, Linseed, (FLAX SEED OIL) 1000 MG CAPS Take 2 capsules by mouth daily.       . Garlic 4098 MG CAPS  Take 1 capsule by mouth daily.      Marland Kitchen glucose blood (ONE TOUCH ULTRA TEST) test strip 1 each by Other route 2 (two) times daily. And lancets 2/day 250.01  180 each  3  . insulin NPH-regular Human (NOVOLIN 70/30) (70-30) 100 UNIT/ML injection 90 units with breakfast, and 10 units with the evening, and syringes 2/day      . Misc Natural Products (COLON CLEANSER PO) Take by mouth.      . Polyethylene Glycol 3350 (MIRALAX PO) Take 17 g by mouth daily as needed.       . pyridoxine (CVS VITAMIN B-6) 200 MG tablet Take 200 mg by mouth daily.      . valsartan-hydrochlorothiazide (DIOVAN-HCT) 160-12.5 MG per tablet TAKE ONE TABLET BY MOUTH EVERY DAY  90 tablet  3  . vitamin B-12 (CYANOCOBALAMIN) 500 MCG tablet Take 500 mcg by mouth daily.       . hydrochlorothiazide (MICROZIDE) 12.5 MG capsule Take 12.5 mg by mouth daily.       No current facility-administered medications on file prior to visit.    Allergies  Allergen Reactions  . Aspirin Other (See  Comments)    BLEEDING BEHIND EYES  . Penicillins     Family History  Problem Relation Age of Onset  . Diabetes Brother   . Heart disease Brother   . Heart disease Mother   . Heart disease Father   . Asthma      BP 118/56  Pulse 88  Temp(Src) 97.5 F (36.4 C) (Oral)  Wt 216 lb (97.977 kg)  SpO2 97%    Review of Systems She denies hypoglycemia.  She has gained a few lbs.      Objective:   Physical Exam VITAL SIGNS:  See vs page GENERAL: no distress Pulses: dorsalis pedis intact bilat.   Feet: no deformity.  Trace bilat leg edema.  There is bilateral onychomycosis Skin:  no ulcer on the feet.  normal color and temp. Neuro: sensation is intact to touch on the feet   Lab Results  Component Value Date   HGBA1C 9.0* 04/29/2014       Assessment & Plan:  DM: moderate exacerbation, worse. Morbid obesity: not improved.  Weight-loss is advised.   Patient is advised the following: Patient Instructions  check your blood sugar twice a day.  vary the time of day when you check, between before the 3 meals, and at bedtime.  also check if you have symptoms of your blood sugar being too high or too low.  please keep a record of the readings and bring it to your next appointment here.  please call us sooner if your blood sugar goes below 70, or if it stays over 200.   A diabetes blood test is requested for you today.  We'll contact you with results.    On this type of insulin schedule, you should eat meals on a regular schedule.  If a meal is missed or significantly delayed, your blood sugar could go low.  Please come back for a follow-up appointment in 3 months.

## 2014-04-29 NOTE — Patient Instructions (Addendum)
check your blood sugar twice a day.  vary the time of day when you check, between before the 3 meals, and at bedtime.  also check if you have symptoms of your blood sugar being too high or too low.  please keep a record of the readings and bring it to your next appointment here.  please call us sooner if your blood sugar goes below 70, or if it stays over 200.   A diabetes blood test is requested for you today.  We'll contact you with results.    On this type of insulin schedule, you should eat meals on a regular schedule.  If a meal is missed or significantly delayed, your blood sugar could go low.  Please come back for a follow-up appointment in 3 months.

## 2014-05-04 ENCOUNTER — Other Ambulatory Visit: Payer: Medicare Other

## 2014-05-04 ENCOUNTER — Other Ambulatory Visit: Payer: Self-pay

## 2014-05-04 DIAGNOSIS — E1039 Type 1 diabetes mellitus with other diabetic ophthalmic complication: Secondary | ICD-10-CM

## 2014-05-04 DIAGNOSIS — E1065 Type 1 diabetes mellitus with hyperglycemia: Principal | ICD-10-CM

## 2014-05-04 LAB — MICROALBUMIN / CREATININE URINE RATIO
Creatinine,U: 54.1 mg/dL
Microalb Creat Ratio: 0.9 mg/g (ref 0.0–30.0)
Microalb, Ur: 0.5 mg/dL (ref 0.0–1.9)

## 2014-05-14 ENCOUNTER — Ambulatory Visit (HOSPITAL_COMMUNITY)
Admission: RE | Admit: 2014-05-14 | Discharge: 2014-05-14 | Disposition: A | Discharge status: Home / Self Care | Payer: Medicare Other | Source: Ambulatory Visit | Attending: Obstetrics & Gynecology | Admitting: Obstetrics & Gynecology

## 2014-05-14 DIAGNOSIS — R9389 Abnormal findings on diagnostic imaging of other specified body structures: Secondary | ICD-10-CM | POA: Insufficient documentation

## 2014-05-21 ENCOUNTER — Telehealth: Payer: Self-pay

## 2014-05-21 DIAGNOSIS — N939 Abnormal uterine and vaginal bleeding, unspecified: Secondary | ICD-10-CM

## 2014-05-21 NOTE — Telephone Encounter (Signed)
Called patient and informed her of results. States she is not bleeding and that "everything has been fine." Informed patient MD reccomends follow up U/S in 6 months-- informed her I would make appointment and call her back with date and time. U/S scheduled for November 23, 2014 0930-- called patient and informed her of appointment date and time. Advised that she call clinic if bleeding returns so that U/S can be completed sooner. Patient verbalized understanding. No questions or concerns.

## 2014-05-21 NOTE — Telephone Encounter (Signed)
Message copied by Geanie Logan on Thu May 21, 2014 12:53 PM ------      Message from: Lavonia Drafts      Created: Thu May 21, 2014  9:37 AM       Please call pt.  Her sono is unchanged from previous study.  Please ask her about bleeding.  If she had had no bleeding she should have a repeat sono in 6 months or sooner if she has any bleeding.            Thx,      clh-S  ------

## 2014-05-31 ENCOUNTER — Other Ambulatory Visit: Payer: Self-pay | Admitting: Internal Medicine

## 2014-06-01 ENCOUNTER — Other Ambulatory Visit: Payer: Self-pay

## 2014-06-01 MED ORDER — INSULIN NPH ISOPHANE & REGULAR (70-30) 100 UNIT/ML ~~LOC~~ SUSP: SUBCUTANEOUS | Status: DC

## 2014-06-10 ENCOUNTER — Ambulatory Visit (INDEPENDENT_AMBULATORY_CARE_PROVIDER_SITE_OTHER): Payer: Medicare Other | Admitting: Internal Medicine

## 2014-06-10 ENCOUNTER — Encounter: Payer: Self-pay | Admitting: Internal Medicine

## 2014-06-10 ENCOUNTER — Other Ambulatory Visit (INDEPENDENT_AMBULATORY_CARE_PROVIDER_SITE_OTHER): Payer: Medicare Other

## 2014-06-10 VITALS — BP 110/68 | HR 79 | Temp 98.5°F | Wt 219.2 lb

## 2014-06-10 DIAGNOSIS — I10 Essential (primary) hypertension: Secondary | ICD-10-CM

## 2014-06-10 DIAGNOSIS — E785 Hyperlipidemia, unspecified: Secondary | ICD-10-CM

## 2014-06-10 DIAGNOSIS — R011 Cardiac murmur, unspecified: Secondary | ICD-10-CM

## 2014-06-10 LAB — HEPATIC FUNCTION PANEL
ALBUMIN: 3.2 g/dL — AB (ref 3.5–5.2)
ALT: 27 U/L (ref 0–35)
AST: 30 U/L (ref 0–37)
Alkaline Phosphatase: 133 U/L — ABNORMAL HIGH (ref 39–117)
Bilirubin, Direct: 0.1 mg/dL (ref 0.0–0.3)
TOTAL PROTEIN: 7.6 g/dL (ref 6.0–8.3)
Total Bilirubin: 0.7 mg/dL (ref 0.2–1.2)

## 2014-06-10 LAB — LIPID PANEL
CHOL/HDL RATIO: 4
Cholesterol: 131 mg/dL (ref 0–200)
HDL: 36.4 mg/dL — AB (ref >39.00)
LDL CALC: 73 mg/dL (ref 0–99)
NonHDL: 94.6
TRIGLYCERIDES: 108 mg/dL (ref 0.0–149.0)
VLDL: 21.6 mg/dL (ref 0.0–40.0)

## 2014-06-10 NOTE — Progress Notes (Signed)
Pre visit review using our clinic review tool, if applicable. No additional management support is needed unless otherwise documented below in the visit note. 

## 2014-06-10 NOTE — Assessment & Plan Note (Signed)
New onset it seems, asympt but ? Developing AS, for echo

## 2014-06-10 NOTE — Assessment & Plan Note (Signed)
re-started lipitor 40 last visit, urged comploiacne, for  F/u lipids today  Lab Results  Component Value Date   LDLCALC 157* 12/05/2013

## 2014-06-10 NOTE — Assessment & Plan Note (Signed)
stable overall by history and exam, recent data reviewed with pt, and pt to continue medical treatment as before,  to f/u any worsening symptoms or concerns BP Readings from Last 3 Encounters:  06/10/14 110/68  04/29/14 118/56  04/10/14 111/58

## 2014-06-10 NOTE — Patient Instructions (Signed)
Please continue all other medications as before, and refills have been done if requested.  Please have the pharmacy call with any other refills you may need.  Please continue your efforts at being more active, low cholesterol diet, and weight control.  You are otherwise up to date with prevention measures today.  Please keep your appointments with your specialists as you may have planned  You will be contacted regarding the referral for: echocardiogram for the heart murmur  Please go to the LAB in the Basement (turn left off the elevator) for the tests to be done today - just the cholesterol  Please return in 6 months, or sooner if needed

## 2014-06-10 NOTE — Progress Notes (Signed)
Subjective:    Patient ID: Gail Lynch, female    DOB: [birth year], 73 y.o.   MRN: [id number]  HPI  Here to f/u; overall doing ok,  Pt denies chest pain, increased sob or doe, wheezing, orthopnea, PND, increased LE swelling, palpitations, dizziness or syncope.  Pt denies polydipsia, polyuria, or low sugar symptoms such as weakness or confusion improved with po intake.  Pt denies new neurological symptoms such as new headache, or facial or extremity weakness or numbness.   Pt states overall good compliance with meds, has been trying to follow lower cholesterol, diabetic diet, with wt overall stable,  but little exercise however, and admits to some occas noncompliacne with meds and diet.   Wt Readings from Last 3 Encounters:  06/10/14 219 lb 4 oz (99.451 kg)  04/29/14 216 lb (97.977 kg)  03/11/14 213 lb 11.2 oz (96.934 kg)  Has gained several lbs when she thought she has lost wt. Past Medical History  Diagnosis Date  . Hypertension   . Hard of hearing     hear aids - left  . Dysplasia of cervix, low grade (CIN 1)     s/p cervical conization  . HPV (human papilloma virus) infection   . Glaucoma   . Hyperlipidemia   . Colon polyps   . Glaucoma 12/18/2011  . Carotid stenosis 06/18/2012  . PONV (postoperative nausea and vomiting)   . Mycotic toenails 02/19/2013  . SVD (spontaneous vaginal delivery)     x 4  . Asthma     hx as a child - no inhaler, no problems  . Intrinsic asthma, unspecified 12/18/2011  . Diabetes mellitus     Type 2  . Arthritis     gout in feet   Past Surgical History  Procedure Laterality Date  . Knee surgery  2010    patella fracture  . Dilation and curettage of uterus  2000  . Breast biopsy  2010  . Breast biopsy  1991  . Multiple tooth extractions    . Hysteroscopy w/d&c N/A 03/04/2014    Procedure: DILATATION AND CURETTAGE /HYSTEROSCOPY;  Surgeon: Lavonia Drafts, MD;  Location: [facility];  Service: Gynecology;  Laterality: N/A;    reports that she  quit smoking about 33 years ago. Her smoking use included Cigarettes. She smoked 0.00 packs per day. She has quit using smokeless tobacco. She reports that she does not drink alcohol or use illicit drugs. family history includes Asthma in an other family member; Diabetes in her brother; Heart disease in her brother, father, and mother. Allergies  Allergen Reactions  . Aspirin Other (See Comments)    BLEEDING BEHIND EYES  . Penicillins    Current Outpatient Prescriptions on File Prior to Visit  Medication Sig Dispense Refill  . acetaminophen (TYLENOL) 500 MG tablet Take 1,000 mg by mouth every 6 (six) hours as needed.      Marland Kitchen allopurinol (ZYLOPRIM) 300 MG tablet Take 0.5 tablets (150 mg total) by mouth daily.  45 tablet  3  . atorvastatin (LIPITOR) 40 MG tablet Take 1 tablet (40 mg total) by mouth daily.  90 tablet  3  . Cholecalciferol (VITAMIN D3) 1000 UNITS CAPS Take 1 capsule by mouth daily.      . fish oil-omega-3 fatty acids 1000 MG capsule Take 2 g by mouth daily.      . Flaxseed, Linseed, (FLAX SEED OIL) 1000 MG CAPS Take 2 capsules by mouth daily.       . Garlic  1000 MG CAPS Take 1 capsule by mouth daily.      Marland Kitchen glucose blood (ONE TOUCH ULTRA TEST) test strip 1 each by Other route 2 (two) times daily. And lancets 2/day 250.01  180 each  3  . hydrochlorothiazide (MICROZIDE) 12.5 MG capsule Take 12.5 mg by mouth daily.      . insulin NPH-regular Human (NOVOLIN 70/30) (70-30) 100 UNIT/ML injection 90 units with breakfast, and 10 units with the evening, and syringes 2/day  30 mL  2  . Misc Natural Products (COLON CLEANSER PO) Take by mouth.      . Polyethylene Glycol 3350 (MIRALAX PO) Take 17 g by mouth daily as needed.       . pyridoxine (CVS VITAMIN B-6) 200 MG tablet Take 200 mg by mouth daily.      . valsartan-hydrochlorothiazide (DIOVAN-HCT) 160-12.5 MG per tablet TAKE 1 TABLET BY MOUTH EVERY DAY  30 tablet  6  . vitamin B-12 (CYANOCOBALAMIN) 500 MCG tablet Take 500 mcg by mouth  daily.        No current facility-administered medications on file prior to visit.   Review of Systems  Constitutional: Negative for unusual diaphoresis or other sweats  HENT: Negative for ringing in ear Eyes: Negative for double vision or worsening visual disturbance.  Respiratory: Negative for choking and stridor.   Gastrointestinal: Negative for vomiting or other signifcant bowel change Genitourinary: Negative for hematuria or decreased urine volume.  Musculoskeletal: Negative for other MSK pain or swelling Skin: Negative for color change and worsening wound.  Neurological: Negative for tremors and numbness other than noted  Psychiatric/Behavioral: Negative for decreased concentration or agitation other than above       Objective:   Physical Exam BP 110/68  Pulse 79  Temp(Src) 98.5 F (36.9 C) (Oral)  Wt 219 lb 4 oz (99.451 kg)  SpO2 99% VS noted,  Constitutional: Pt appears well-developed, well-nourished.  HENT: Head: NCAT.  Right Ear: External ear normal.  Left Ear: External ear normal.  Eyes: . Pupils are equal, round, and reactive to light. Conjunctivae and EOM are normal Neck: Normal range of motion. Neck supple.  Cardiovascular: Normal rate and regular rhythm.  with gr 2/6 sys murmur Pulmonary/Chest: Effort normal and breath sounds normal.  Abd:  Soft, NT, ND, + BS Neurological: Pt is alert. Not confused , motor grossly intact Skin: Skin is warm. No rash Psychiatric: Pt behavior is normal. No agitation.     Assessment & Plan:

## 2014-06-11 ENCOUNTER — Ambulatory Visit (INDEPENDENT_AMBULATORY_CARE_PROVIDER_SITE_OTHER): Payer: Medicare Other | Admitting: Podiatrist

## 2014-06-11 DIAGNOSIS — B351 Tinea unguium: Secondary | ICD-10-CM

## 2014-06-11 DIAGNOSIS — M79676 Pain in unspecified toe(s): Secondary | ICD-10-CM

## 2014-06-11 DIAGNOSIS — E1040 Type 1 diabetes mellitus with diabetic neuropathy, unspecified: Secondary | ICD-10-CM

## 2014-06-12 NOTE — Progress Notes (Signed)
HPI: Patient presents today for follow up of diabetic foot and nail care. Past medical history, meds, and allergies reviewed. Patient states blood sugar is under fair control.  Objective: Objective: Patients chart is reviewed. Vascular status reveals pedal pulses noted at 1 out of 4 dp and 0/4 pt bilateral . Neurological sensation is Normal to Semmes Weinstein monofilament bilateral at 3/5 sites bilateral. Dermatological exam reveals absence of pre ulcerative/ hyperkeratotic lesions. Toenails are elongated, incurvated, discolored, dystrophic with ingrown deformity present. Moderate edema present today  Assessment: Diabetes with Neuropathy/Angiopathy , Ingrown nail deformity, edema  Plan: Discussed treatment options and alternatives. Debrided nails without complication. She was measured and scanned for diabetic shoes and inserts. Return appointment recommended at routine intervals of 3 months.     

## 2014-06-19 ENCOUNTER — Ambulatory Visit (HOSPITAL_COMMUNITY): Payer: Medicare Other

## 2014-06-22 ENCOUNTER — Ambulatory Visit (HOSPITAL_COMMUNITY): Payer: Medicare Other

## 2014-06-22 ENCOUNTER — Encounter: Payer: Self-pay | Admitting: Internal Medicine

## 2014-07-30 ENCOUNTER — Encounter: Payer: Self-pay | Admitting: Endocrinology

## 2014-07-30 ENCOUNTER — Ambulatory Visit (INDEPENDENT_AMBULATORY_CARE_PROVIDER_SITE_OTHER): Payer: Medicare Other | Admitting: Endocrinology

## 2014-07-30 VITALS — BP 130/82 | HR 90 | Temp 98.3°F | Ht 63.0 in | Wt 221.0 lb

## 2014-07-30 DIAGNOSIS — E1065 Type 1 diabetes mellitus with hyperglycemia: Principal | ICD-10-CM

## 2014-07-30 DIAGNOSIS — IMO0002 Reserved for concepts with insufficient information to code with codable children: Secondary | ICD-10-CM

## 2014-07-30 DIAGNOSIS — E1039 Type 1 diabetes mellitus with other diabetic ophthalmic complication: Secondary | ICD-10-CM

## 2014-07-30 LAB — HEMOGLOBIN A1C
Hgb A1c MFr Bld: 9.4 % — ABNORMAL HIGH (ref <5.7)
MEAN PLASMA GLUCOSE: 223 mg/dL — AB (ref <117)

## 2014-07-30 NOTE — Patient Instructions (Signed)
check your blood sugar twice a day.  vary the time of day when you check, between before the 3 meals, and at bedtime.  also check if you have symptoms of your blood sugar being too high or too low.  please keep a record of the readings and bring it to your next appointment here.  please call us sooner if your blood sugar goes below 70, or if it stays over 200.  As you write it out, please also write down why it might be higher or lower on a certain day.   A diabetes blood test is requested for you today.  We'll contact you with results.    On this type of insulin schedule, you should eat meals on a regular schedule.  If a meal is missed or significantly delayed, your blood sugar could go low.  Please come back for a follow-up appointment in 3 months.    

## 2014-07-30 NOTE — Progress Notes (Signed)
Subjective:    Patient ID: Gail Lynch, female    DOB: 07/27/1941, 73 y.o.   MRN: [id number]  HPI  Pt returns for f/u of diabetes mellitus: DM type: Insulin-requiring type 2 Dx'ed: 8469 Complications: polyneuropathy and retinopathy Therapy: insulin GDM: never DKA: never Severe hypoglycemia: never Pancreatitis: never Other: she has chosen a simple BID insulin regimen; she takes human insulin, due to cost; she cannot have weight-loss surgery, due to having medicaid. Interval history: Pt says she takes a widely varying dosage of insulin.  she brings a record of her cbg's which i have reviewed today.  It varies from 66-300, but most are approx 100.  Past Medical History  Diagnosis Date  . Hypertension   . Hard of hearing     hear aids - left  . Dysplasia of cervix, low grade (CIN 1)     s/p cervical conization  . HPV (human papilloma virus) infection   . Glaucoma   . Hyperlipidemia   . Colon polyps   . Glaucoma 12/18/2011  . Carotid stenosis 06/18/2012  . PONV (postoperative nausea and vomiting)   . Mycotic toenails 02/19/2013  . SVD (spontaneous vaginal delivery)     x 4  . Asthma     hx as a child - no inhaler, no problems  . Intrinsic asthma, unspecified 12/18/2011  . Diabetes mellitus     Type 2  . Arthritis     gout in feet    Past Surgical History  Procedure Laterality Date  . Knee surgery  2010    patella fracture  . Dilation and curettage of uterus  2000  . Breast biopsy  2010  . Breast biopsy  1991  . Multiple tooth extractions    . Hysteroscopy w/d&c N/A 03/04/2014    Procedure: DILATATION AND CURETTAGE /HYSTEROSCOPY;  Surgeon: Lavonia Drafts, MD;  Location: [facility];  Service: Gynecology;  Laterality: N/A;    History   Social History  . Marital Status: Widowed    Spouse Name: N/A    Number of Children: 3  . Years of Education: N/A   Occupational History  . retired    Social History Main Topics  . Smoking status: Former Smoker    Types:  Cigarettes    Quit date: 08/21/1980  . Smokeless tobacco: Former Systems developer  . Alcohol Use: No  . Drug Use: No  . Sexual Activity: Yes    Birth Control/ Protection: Post-menopausal   Other Topics Concern  . Not on file   Social History Narrative    Current Outpatient Prescriptions on File Prior to Visit  Medication Sig Dispense Refill  . acetaminophen (TYLENOL) 500 MG tablet Take 1,000 mg by mouth every 6 (six) hours as needed.    Marland Kitchen allopurinol (ZYLOPRIM) 300 MG tablet Take 0.5 tablets (150 mg total) by mouth daily. 45 tablet 3  . atorvastatin (LIPITOR) 40 MG tablet Take 1 tablet (40 mg total) by mouth daily. 90 tablet 3  . Cholecalciferol (VITAMIN D3) 1000 UNITS CAPS Take 1 capsule by mouth daily.    . fish oil-omega-3 fatty acids 1000 MG capsule Take 2 g by mouth daily.    . Flaxseed, Linseed, (FLAX SEED OIL) 1000 MG CAPS Take 2 capsules by mouth daily.     . Garlic 6295 MG CAPS Take 1 capsule by mouth daily.    Marland Kitchen glucose blood (ONE TOUCH ULTRA TEST) test strip 1 each by Other route 2 (two) times daily. And lancets 2/day 250.01 180  each 3  . hydrochlorothiazide (MICROZIDE) 12.5 MG capsule Take 12.5 mg by mouth daily.    . Misc Natural Products (COLON CLEANSER PO) Take by mouth.    . Polyethylene Glycol 3350 (MIRALAX PO) Take 17 g by mouth daily as needed.     . pyridoxine (CVS VITAMIN B-6) 200 MG tablet Take 200 mg by mouth daily.    . valsartan-hydrochlorothiazide (DIOVAN-HCT) 160-12.5 MG per tablet TAKE 1 TABLET BY MOUTH EVERY DAY 30 tablet 6  . vitamin B-12 (CYANOCOBALAMIN) 500 MCG tablet Take 500 mcg by mouth daily.      No current facility-administered medications on file prior to visit.    Allergies  Allergen Reactions  . Aspirin Other (See Comments)    BLEEDING BEHIND EYES  . Penicillins     Family History  Problem Relation Age of Onset  . Diabetes Brother   . Heart disease Brother   . Heart disease Mother   . Heart disease Father   . Asthma      BP 130/82 mmHg   Pulse 90  Temp(Src) 98.3 F (36.8 C) (Oral)  Ht 5\' 3"  (1.6 m)  Wt 221 lb (100.245 kg)  BMI 39.16 kg/m2  SpO2 93%  Review of Systems Denies LOC and weight change    Objective:   Physical Exam VITAL SIGNS:  See vs page GENERAL: no distress Pulses: dorsalis pedis intact bilat.   Feet: no deformity.  Trace bilat leg edema.  There is bilateral onychomycosis. Skin:  no ulcer on the feet.  normal color and temp. Neuro: sensation is intact to touch on the feet   Lab Results  Component Value Date   HGBA1C 9.4* 07/30/2014      Assessment & Plan:  DM: moderate exacerbation Noncompliance with insulin dosing: I'll work around this as best I can, which is to change to NPH insulin, which she won't have to take with meals.    Patient is advised the following: Patient Instructions  check your blood sugar twice a day.  vary the time of day when you check, between before the 3 meals, and at bedtime.  also check if you have symptoms of your blood sugar being too high or too low.  please keep a record of the readings and bring it to your next appointment here.  please call us sooner if your blood sugar goes below 70, or if it stays over 200.  As you write it out, please also write down why it might be higher or lower on a certain day.   A diabetes blood test is requested for you today.  We'll contact you with results.    On this type of insulin schedule, you should eat meals on a regular schedule.  If a meal is missed or significantly delayed, your blood sugar could go low.  Please come back for a follow-up appointment in 3 months.

## 2014-07-31 MED ORDER — INSULIN NPH (HUMAN) (ISOPHANE) 100 UNIT/ML ~~LOC~~ SUSP: SUBCUTANEOUS | Status: DC

## 2014-09-01 DIAGNOSIS — E11311 Type 2 diabetes mellitus with unspecified diabetic retinopathy with macular edema: Secondary | ICD-10-CM | POA: Diagnosis not present

## 2014-09-01 DIAGNOSIS — E11329 Type 2 diabetes mellitus with mild nonproliferative diabetic retinopathy without macular edema: Secondary | ICD-10-CM | POA: Diagnosis not present

## 2014-09-03 ENCOUNTER — Telehealth: Payer: Self-pay

## 2014-09-03 MED ORDER — INSULIN NPH (HUMAN) (ISOPHANE) 100 UNIT/ML ~~LOC~~ SUSP: SUBCUTANEOUS | Status: DC

## 2014-09-03 NOTE — Telephone Encounter (Signed)
ok 

## 2014-09-03 NOTE — Telephone Encounter (Signed)
Rx faxed

## 2014-09-03 NOTE — Telephone Encounter (Signed)
Received a fax from pt's pharmacy stating that Novolin is no longer the preferred. Humulin is the preferred, ok to change?

## 2014-09-10 ENCOUNTER — Ambulatory Visit: Payer: Medicare Other | Admitting: Podiatrist

## 2014-09-30 ENCOUNTER — Telehealth: Payer: Self-pay | Admitting: Endocrinology

## 2014-09-30 NOTE — Telephone Encounter (Signed)
Pt calling about insulin the new one that was given to her doesn't seem to be working as well. Her BS has been in 200s

## 2014-09-30 NOTE — Telephone Encounter (Signed)
See both notes below Gail advise if note is correct.  I contacted pt to discuss blood sugar readings. Pt stated she has been having blood sugar reading in Gail 200 in Gail evenings. Pt stated that she thought it was Gail new type of insulin(Humulin) that was causing her blood sugar to go high. I discussed medication dosage Gail she stated that she was taking 70u in Gail morning Gail 20u in Gail evening. Pt was advised that on 07/31/2014 MD wanted to changed dosage to 90u in Gail morning Gail 10u in Gail evening. Pt stated she did not start that dosage at that time because she though it would bring her numbers too low. Pt advised to start taking 90u in Gail morning Gail 10u in Gail evening as MD had prescribed. PT voiced understanding Gail stated she will will call back if readings do not go down.

## 2014-10-12 ENCOUNTER — Encounter: Payer: Self-pay | Admitting: Gastroenterology

## 2014-10-23 ENCOUNTER — Telehealth: Payer: Self-pay | Admitting: Internal Medicine

## 2014-10-23 NOTE — Telephone Encounter (Signed)
Pt called in said that she would like for Dr Jenny Reichmann to put order in to see Dr Ardis Hughs up stairs in GI

## 2014-10-23 NOTE — Telephone Encounter (Signed)
Please consider OV as I would not why this is needed, request did not mention a reason

## 2014-10-26 ENCOUNTER — Other Ambulatory Visit: Payer: Self-pay | Admitting: Internal Medicine

## 2014-10-26 ENCOUNTER — Other Ambulatory Visit: Payer: Self-pay

## 2014-10-26 DIAGNOSIS — R921 Mammographic calcification found on diagnostic imaging of breast: Secondary | ICD-10-CM

## 2014-10-28 NOTE — Telephone Encounter (Signed)
Called pt gave her md response made appt for 11/03/14 per pt request.../lmb

## 2014-10-29 ENCOUNTER — Ambulatory Visit (INDEPENDENT_AMBULATORY_CARE_PROVIDER_SITE_OTHER): Payer: Medicare Other | Admitting: Endocrinology

## 2014-10-29 ENCOUNTER — Encounter: Payer: Self-pay | Admitting: Endocrinology

## 2014-10-29 VITALS — BP 104/62 | HR 65 | Temp 98.2°F | Resp 18 | Ht 62.0 in | Wt 218.6 lb

## 2014-10-29 DIAGNOSIS — IMO0002 Reserved for concepts with insufficient information to code with codable children: Secondary | ICD-10-CM

## 2014-10-29 DIAGNOSIS — E1065 Type 1 diabetes mellitus with hyperglycemia: Principal | ICD-10-CM

## 2014-10-29 DIAGNOSIS — E1039 Type 1 diabetes mellitus with other diabetic ophthalmic complication: Secondary | ICD-10-CM | POA: Diagnosis not present

## 2014-10-29 LAB — HEMOGLOBIN A1C: Hgb A1c MFr Bld: 10.5 % — ABNORMAL HIGH (ref 4.6–6.5)

## 2014-10-29 NOTE — Progress Notes (Signed)
Subjective:    Patient ID: Gail Lynch, female    DOB: [birth year], 74 y.o.   MRN: [id number]  HPI Pt returns for f/u of diabetes mellitus: DM type: Insulin-requiring type 2 Dx'ed: 8676 Complications: polyneuropathy and retinopathy Therapy: insulin GDM: never DKA: never Severe hypoglycemia: never Pancreatitis: never Other: she has chosen a simple BID insulin regimen; she takes human insulin, due to cost; she cannot have weight-loss surgery, due to having medicaid. Interval history: she brings a record of her cbg's which i have reviewed today.  It varies from 160-300.  It is in general higher as the day goes on.  pt states she feels well in general.   Past Medical History  Diagnosis Date  . Hypertension   . Hard of hearing     hear aids - left  . Dysplasia of cervix, low grade (CIN 1)     s/p cervical conization  . HPV (human papilloma virus) infection   . Glaucoma   . Hyperlipidemia   . Colon polyps   . Glaucoma 12/18/2011  . Carotid stenosis 06/18/2012  . PONV (postoperative nausea and vomiting)   . Mycotic toenails 02/19/2013  . SVD (spontaneous vaginal delivery)     x 4  . Asthma     hx as a child - no inhaler, no problems  . Intrinsic asthma, unspecified 12/18/2011  . Diabetes mellitus     Type 2  . Arthritis     gout in feet    Past Surgical History  Procedure Laterality Date  . Knee surgery  2010    patella fracture  . Dilation and curettage of uterus  2000  . Breast biopsy  2010  . Breast biopsy  1991  . Multiple tooth extractions    . Hysteroscopy w/d&c N/A 03/04/2014    Procedure: DILATATION AND CURETTAGE /HYSTEROSCOPY;  Surgeon: Lavonia Drafts, MD;  Location: [facility];  Service: Gynecology;  Laterality: N/A;    History   Social History  . Marital Status: Widowed    Spouse Name: N/A  . Number of Children: 3  . Years of Education: N/A   Occupational History  . retired    Social History Main Topics  . Smoking status: Former Smoker   Types: Cigarettes    Quit date: 08/21/1980  . Smokeless tobacco: Former Systems developer  . Alcohol Use: No  . Drug Use: No  . Sexual Activity: Yes    Birth Control/ Protection: Post-menopausal   Other Topics Concern  . Not on file   Social History Narrative    Current Outpatient Prescriptions on File Prior to Visit  Medication Sig Dispense Refill  . acetaminophen (TYLENOL) 500 MG tablet Take 1,000 mg by mouth every 6 (six) hours as needed.    Marland Kitchen allopurinol (ZYLOPRIM) 300 MG tablet Take 0.5 tablets (150 mg total) by mouth daily. 45 tablet 3  . atorvastatin (LIPITOR) 40 MG tablet Take 1 tablet (40 mg total) by mouth daily. 90 tablet 3  . Cholecalciferol (VITAMIN D3) 1000 UNITS CAPS Take 1 capsule by mouth daily.    . fish oil-omega-3 fatty acids 1000 MG capsule Take 2 g by mouth daily.    . Flaxseed, Linseed, (FLAX SEED OIL) 1000 MG CAPS Take 2 capsules by mouth daily.     . Garlic 7209 MG CAPS Take 1 capsule by mouth daily.    Marland Kitchen glucose blood (ONE TOUCH ULTRA TEST) test strip 1 each by Other route 2 (two) times daily. And lancets 2/day 250.01 180  each 3  . hydrochlorothiazide (MICROZIDE) 12.5 MG capsule Take 12.5 mg by mouth daily.    . insulin NPH Human (HUMULIN N) 100 UNIT/ML injection Inject 90 units with breakfast and 10 units with evening meal. (Patient taking differently: Inject 130 units with breakfast and 10 units with evening meal.) 30 mL 3  . Misc Natural Products (COLON CLEANSER PO) Take by mouth.    . pyridoxine (CVS VITAMIN B-6) 200 MG tablet Take 200 mg by mouth daily.    . vitamin B-12 (CYANOCOBALAMIN) 500 MCG tablet Take 500 mcg by mouth daily.     . valsartan-hydrochlorothiazide (DIOVAN-HCT) 160-12.5 MG per tablet TAKE 1 TABLET BY MOUTH EVERY DAY (Patient not taking: Reported on 10/29/2014) 30 tablet 6   No current facility-administered medications on file prior to visit.    Allergies  Allergen Reactions  . Aspirin Other (See Comments)    BLEEDING BEHIND EYES  . Penicillins      Family History  Problem Relation Age of Onset  . Diabetes Brother   . Heart disease Brother   . Heart disease Mother   . Heart disease Father   . Asthma      BP 104/62 mmHg  Pulse 65  Temp(Src) 98.2 F (36.8 C)  Resp 18  Ht 5\' 2"  (1.575 m)  Wt 218 lb 9.6 oz (99.156 kg)  BMI 39.97 kg/m2  SpO2 99%    Review of Systems She denies hypoglycemia and weight change.      Objective:   Physical Exam VITAL SIGNS:  See vs page.   GENERAL: no distress.   Pulses: dorsalis pedis intact bilat.   MSK: no deformity of the feet CV: 1+ bilat leg edema.   Skin:  no ulcer on the feet.  normal color and temp on the feet.   Neuro: sensation is intact to touch on the feet.   Ext: There is bilateral onychomycosis of the toenails.    Lab Results  Component Value Date   HGBA1C 10.5* 10/29/2014      Assessment & Plan:  DM: glycemic control is worse  Patient is advised the following: Patient Instructions  check your blood sugar twice a day.  vary the time of day when you check, between before the 3 meals, and at bedtime.  also check if you have symptoms of your blood sugar being too high or too low.  please keep a record of the readings and bring it to your next appointment here.  please call us sooner if your blood sugar goes below 70, or if it stays over 200.  As you write it out, please also write down why it might be higher or lower on a certain day.   A diabetes blood test is requested for you today.  We'll contact you with results.    On this type of insulin schedule, you should eat meals on a regular schedule.  If a meal is missed or significantly delayed, your blood sugar could go low.  Please come back for a follow-up appointment in 3 months.     addendum: increase insulin to130 units with breakfast and 10 units with evening meal.

## 2014-10-29 NOTE — Patient Instructions (Signed)
check your blood sugar twice a day.  vary the time of day when you check, between before the 3 meals, and at bedtime.  also check if you have symptoms of your blood sugar being too high or too low.  please keep a record of the readings and bring it to your next appointment here.  please call us sooner if your blood sugar goes below 70, or if it stays over 200.  As you write it out, please also write down why it might be higher or lower on a certain day.   A diabetes blood test is requested for you today.  We'll contact you with results.    On this type of insulin schedule, you should eat meals on a regular schedule.  If a meal is missed or significantly delayed, your blood sugar could go low.  Please come back for a follow-up appointment in 3 months.

## 2014-10-30 ENCOUNTER — Ambulatory Visit
Admission: RE | Admit: 2014-10-30 | Discharge: 2014-10-30 | Disposition: A | Discharge status: Home / Self Care | Payer: Medicaid Other | Source: Ambulatory Visit | Attending: Internal Medicine | Admitting: Internal Medicine

## 2014-10-30 DIAGNOSIS — R921 Mammographic calcification found on diagnostic imaging of breast: Secondary | ICD-10-CM | POA: Diagnosis not present

## 2014-11-03 ENCOUNTER — Encounter: Payer: Self-pay | Admitting: Internal Medicine

## 2014-11-03 ENCOUNTER — Other Ambulatory Visit (INDEPENDENT_AMBULATORY_CARE_PROVIDER_SITE_OTHER): Payer: Medicare Other

## 2014-11-03 ENCOUNTER — Ambulatory Visit (INDEPENDENT_AMBULATORY_CARE_PROVIDER_SITE_OTHER): Payer: Medicare Other | Admitting: Internal Medicine

## 2014-11-03 VITALS — BP 118/64 | HR 75 | Temp 98.2°F | Resp 18 | Ht 62.0 in | Wt 215.1 lb

## 2014-11-03 DIAGNOSIS — R011 Cardiac murmur, unspecified: Secondary | ICD-10-CM

## 2014-11-03 DIAGNOSIS — I10 Essential (primary) hypertension: Secondary | ICD-10-CM

## 2014-11-03 DIAGNOSIS — E785 Hyperlipidemia, unspecified: Secondary | ICD-10-CM | POA: Diagnosis not present

## 2014-11-03 DIAGNOSIS — Z0000 Encounter for general adult medical examination without abnormal findings: Secondary | ICD-10-CM | POA: Diagnosis not present

## 2014-11-03 LAB — LIPID PANEL
CHOL/HDL RATIO: 4
Cholesterol: 127 mg/dL (ref 0–200)
HDL: 34.2 mg/dL — ABNORMAL LOW (ref >39.00)
LDL Cholesterol: 71 mg/dL (ref 0–99)
NonHDL: 92.8
Triglycerides: 107 mg/dL (ref 0.0–149.0)
VLDL: 21.4 mg/dL (ref 0.0–40.0)

## 2014-11-03 LAB — HEPATIC FUNCTION PANEL
ALT: 18 U/L (ref 0–35)
AST: 16 U/L (ref 0–37)
Albumin: 3.7 g/dL (ref 3.5–5.2)
Alkaline Phosphatase: 125 U/L — ABNORMAL HIGH (ref 39–117)
BILIRUBIN TOTAL: 0.3 mg/dL (ref 0.2–1.2)
Bilirubin, Direct: 0 mg/dL (ref 0.0–0.3)
TOTAL PROTEIN: 7 g/dL (ref 6.0–8.3)

## 2014-11-03 LAB — CBC WITH DIFFERENTIAL/PLATELET
BASOS ABS: 0 10*3/uL (ref 0.0–0.1)
Basophils Relative: 0.6 % (ref 0.0–3.0)
EOS ABS: 0.1 10*3/uL (ref 0.0–0.7)
EOS PCT: 1.5 % (ref 0.0–5.0)
HCT: 36.4 % (ref 36.0–46.0)
Hemoglobin: 12 g/dL (ref 12.0–15.0)
LYMPHS PCT: 28.1 % (ref 12.0–46.0)
Lymphs Abs: 2.1 10*3/uL (ref 0.7–4.0)
MCHC: 33 g/dL (ref 30.0–36.0)
MCV: 85.9 fl (ref 78.0–100.0)
MONO ABS: 0.6 10*3/uL (ref 0.1–1.0)
MONOS PCT: 8.5 % (ref 3.0–12.0)
NEUTROS PCT: 61.3 % (ref 43.0–77.0)
Neutro Abs: 4.5 10*3/uL (ref 1.4–7.7)
PLATELETS: 293 10*3/uL (ref 150.0–400.0)
RBC: 4.24 Mil/uL (ref 3.87–5.11)
RDW: 14.4 % (ref 11.5–15.5)
WBC: 7.4 10*3/uL (ref 4.0–10.5)

## 2014-11-03 LAB — BASIC METABOLIC PANEL
BUN: 16 mg/dL (ref 6–23)
CHLORIDE: 103 meq/L (ref 96–112)
CO2: 32 mEq/L (ref 19–32)
Calcium: 9.4 mg/dL (ref 8.4–10.5)
Creatinine, Ser: 1.21 mg/dL — ABNORMAL HIGH (ref 0.40–1.20)
GFR: 55.67 mL/min — ABNORMAL LOW (ref >60.00)
GLUCOSE: 258 mg/dL — AB (ref 70–99)
Potassium: 3.9 mEq/L (ref 3.5–5.1)
Sodium: 138 mEq/L (ref 135–145)

## 2014-11-03 LAB — TSH: TSH: 2 u[IU]/mL (ref 0.35–4.50)

## 2014-11-03 NOTE — Patient Instructions (Signed)
Please continue all other medications as before, and refills have been done if requested.  Please have the pharmacy call with any other refills you may need.  Please continue your efforts at being more active, low cholesterol diet, and weight control.  You are otherwise up to date with prevention measures today.  Please keep your appointments with your specialists as you may have planned  You will be contacted regarding the referral for: echocardiogram, and colonoscopy  Please go to the LAB in the Basement (turn left off the elevator) for the tests to be done today  You will be contacted by phone if any changes need to be made immediately.  Otherwise, you will receive a letter about your results with an explanation, but please check with MyChart first.  Please return in 6 months, or sooner if needed

## 2014-11-03 NOTE — Progress Notes (Signed)
Subjective:    Patient ID: Gail Lynch, female    DOB: 06-15-1941, 74 y.o.   MRN: [id number]  HPI  Here for wellness and f/u;  Overall doing ok;  Pt denies Chest pain, worsening SOB, DOE, wheezing, orthopnea, PND, worsening LE edema, palpitations, dizziness or syncope.  Pt denies neurological change such as new headache, facial or extremity weakness.  Pt denies polydipsia, polyuria, or low sugar symptoms. Pt states overall good compliance with treatment and medications, good tolerability, and has been trying to follow appropriate diet.  Pt denies worsening depressive symptoms, suicidal ideation or panic. No fever, night sweats, wt loss, loss of appetite, or other constitutional symptoms.  Pt states good ability with ADL's, has low fall risk, home safety reviewed and adequate, no other significant changes in hearing or vision, and only occasionally active with exercise. No current complaints.  Due colonoscopy Past Medical History  Diagnosis Date  . Hypertension   . Hard of hearing     hear aids - left  . Dysplasia of cervix, low grade (CIN 1)     s/p cervical conization  . HPV (human papilloma virus) infection   . Glaucoma   . Hyperlipidemia   . Colon polyps   . Glaucoma 12/18/2011  . Carotid stenosis 06/18/2012  . PONV (postoperative nausea and vomiting)   . Mycotic toenails 02/19/2013  . SVD (spontaneous vaginal delivery)     x 4  . Asthma     hx as a child - no inhaler, no problems  . Intrinsic asthma, unspecified 12/18/2011  . Diabetes mellitus     Type 2  . Arthritis     gout in feet   Past Surgical History  Procedure Laterality Date  . Knee surgery  2010    patella fracture  . Dilation and curettage of uterus  2000  . Breast biopsy  2010  . Breast biopsy  1991  . Multiple tooth extractions    . Hysteroscopy w/d&c N/A 03/04/2014    Procedure: DILATATION AND CURETTAGE /HYSTEROSCOPY;  Surgeon: Lavonia Drafts, MD;  Location: [facility];  Service: Gynecology;  Laterality:  N/A;    reports that she quit smoking about 34 years ago. Her smoking use included Cigarettes. She has quit using smokeless tobacco. She reports that she does not drink alcohol or use illicit drugs. family history includes Asthma in an other family member; Diabetes in her brother; Heart disease in her brother, father, and mother. Allergies  Allergen Reactions  . Aspirin Other (See Comments)    BLEEDING BEHIND EYES  . Penicillins    Current Outpatient Prescriptions on File Prior to Visit  Medication Sig Dispense Refill  . acetaminophen (TYLENOL) 500 MG tablet Take 1,000 mg by mouth every 6 (six) hours as needed.    Marland Kitchen allopurinol (ZYLOPRIM) 300 MG tablet Take 0.5 tablets (150 mg total) by mouth daily. 45 tablet 3  . atorvastatin (LIPITOR) 40 MG tablet Take 1 tablet (40 mg total) by mouth daily. 90 tablet 3  . Cholecalciferol (VITAMIN D3) 1000 UNITS CAPS Take 1 capsule by mouth daily.    . fish oil-omega-3 fatty acids 1000 MG capsule Take 2 g by mouth daily.    . Flaxseed, Linseed, (FLAX SEED OIL) 1000 MG CAPS Take 2 capsules by mouth daily.     . Garlic 9798 MG CAPS Take 1 capsule by mouth daily.    Marland Kitchen glucose blood (ONE TOUCH ULTRA TEST) test strip 1 each by Other route 2 (two) times daily. And  lancets 2/day 250.01 180 each 3  . hydrochlorothiazide (MICROZIDE) 12.5 MG capsule Take 12.5 mg by mouth daily.    . insulin NPH Human (HUMULIN N) 100 UNIT/ML injection Inject 90 units with breakfast and 10 units with evening meal. (Patient taking differently: Inject 130 units with breakfast and 10 units with evening meal.) 30 mL 3  . Misc Natural Products (COLON CLEANSER PO) Take by mouth.    . pyridoxine (CVS VITAMIN B-6) 200 MG tablet Take 200 mg by mouth daily.    . valsartan-hydrochlorothiazide (DIOVAN-HCT) 160-12.5 MG per tablet TAKE 1 TABLET BY MOUTH EVERY DAY 30 tablet 6  . vitamin B-12 (CYANOCOBALAMIN) 500 MCG tablet Take 500 mcg by mouth daily.      No current facility-administered  medications on file prior to visit.   Review of Systems Constitutional: Negative for increased diaphoresis, other activity, appetite or siginficant weight change other than noted HENT: Negative for worsening hearing loss, ear pain, facial swelling, mouth sores and neck stiffness.   Eyes: Negative for other worsening pain, redness or visual disturbance.  Respiratory: Negative for shortness of breath and wheezing  Cardiovascular: Negative for chest pain and palpitations.  Gastrointestinal: Negative for diarrhea, blood in stool, abdominal distention or other pain Genitourinary: Negative for hematuria, flank pain or change in urine volume.  Musculoskeletal: Negative for myalgias or other joint complaints.  Skin: Negative for color change and wound or drainage.  Neurological: Negative for syncope and numbness. other than noted Hematological: Negative for adenopathy. or other swelling Psychiatric/Behavioral: Negative for hallucinations, SI, self-injury, decreased concentration or other worsening agitation.      Objective:   Physical Exam BP 118/64 mmHg  Pulse 75  Temp(Src) 98.2 F (36.8 C) (Oral)  Resp 18  Ht 5\' 2"  (1.575 m)  Wt 215 lb 1.9 oz (97.578 kg)  BMI 39.34 kg/m2  SpO2 98% VS noted,  Constitutional: Pt is oriented to person, place, and time. Appears well-developed and well-nourished, in no significant distress Head: Normocephalic and atraumatic.  Right Ear: External ear normal.  Left Ear: External ear normal.  Nose: Nose normal.  Mouth/Throat: Oropharynx is clear and moist.  Eyes: Conjunctivae and EOM are normal. Pupils are equal, round, and reactive to light.  Neck: Normal range of motion. Neck supple. No JVD present. No tracheal deviation present or significant neck LA or mass Cardiovascular: Normal rate, regular rhythm, normal heart sounds and intact distal pulses.  with gr 2-3/6 sys murmur LUSB Pulmonary/Chest: Effort normal and breath sounds without rales or wheezing    Abdominal: Soft. Bowel sounds are normal. NT. No HSM  Musculoskeletal: Normal range of motion. Exhibits no edema.  Lymphadenopathy:  Has no cervical adenopathy.  Neurological: Pt is alert and oriented to person, place, and time. Pt has normal reflexes. No cranial nerve deficit. Motor grossly intact Skin: Skin is warm and dry. No rash noted.  Psychiatric:  Has normal mood and affect. Behavior is normal.     Assessment & Plan:

## 2014-11-03 NOTE — Progress Notes (Signed)
Pre visit review using our clinic review tool, if applicable. No additional management support is needed unless otherwise documented below in the visit note. 

## 2014-11-04 ENCOUNTER — Encounter: Payer: Self-pay | Admitting: Gastroenterology

## 2014-11-05 DIAGNOSIS — E109 Type 1 diabetes mellitus without complications: Secondary | ICD-10-CM | POA: Diagnosis not present

## 2014-11-06 DIAGNOSIS — Z961 Presence of intraocular lens: Secondary | ICD-10-CM | POA: Diagnosis not present

## 2014-11-06 DIAGNOSIS — E11321 Type 2 diabetes mellitus with mild nonproliferative diabetic retinopathy with macular edema: Secondary | ICD-10-CM | POA: Diagnosis not present

## 2014-11-07 NOTE — Assessment & Plan Note (Signed)

## 2014-11-07 NOTE — Assessment & Plan Note (Signed)
stable overall by history and exam, recent data reviewed with pt, and pt to continue medical treatment as before,  to f/u any worsening symptoms or concerns Lab Results  Component Value Date   LDLCALC 71 11/03/2014

## 2014-11-07 NOTE — Assessment & Plan Note (Signed)
For echo, r/o AS

## 2014-11-07 NOTE — Assessment & Plan Note (Signed)
stable overall by history and exam, recent data reviewed with pt, and pt to continue medical treatment as before,  to f/u any worsening symptoms or concerns BP Readings from Last 3 Encounters:  11/03/14 118/64  10/29/14 104/62  07/30/14 130/82

## 2014-11-23 ENCOUNTER — Ambulatory Visit (HOSPITAL_COMMUNITY)
Admission: RE | Admit: 2014-11-23 | Discharge: 2014-11-23 | Disposition: A | Discharge status: Home / Self Care | Payer: Medicare Other | Source: Ambulatory Visit | Attending: Obstetrics & Gynecology | Admitting: Obstetrics & Gynecology

## 2014-11-23 DIAGNOSIS — R938 Abnormal findings on diagnostic imaging of other specified body structures: Secondary | ICD-10-CM | POA: Diagnosis not present

## 2014-11-23 DIAGNOSIS — N950 Postmenopausal bleeding: Secondary | ICD-10-CM | POA: Insufficient documentation

## 2014-11-23 DIAGNOSIS — N939 Abnormal uterine and vaginal bleeding, unspecified: Secondary | ICD-10-CM

## 2014-11-23 DIAGNOSIS — R838 Other abnormal findings in cerebrospinal fluid: Secondary | ICD-10-CM | POA: Diagnosis not present

## 2014-11-27 ENCOUNTER — Encounter: Payer: Self-pay | Admitting: Internal Medicine

## 2014-11-27 ENCOUNTER — Ambulatory Visit (HOSPITAL_COMMUNITY): Payer: Medicare Other | Attending: Internal Medicine | Admitting: Radiology

## 2014-11-27 DIAGNOSIS — R011 Cardiac murmur, unspecified: Secondary | ICD-10-CM | POA: Diagnosis not present

## 2014-11-27 NOTE — Progress Notes (Signed)
Echocardiogram performed.  

## 2014-12-07 ENCOUNTER — Telehealth: Payer: Self-pay | Admitting: Endocrinology

## 2014-12-07 NOTE — Telephone Encounter (Signed)
Contacted pt and advised that Dr. Loanne Drilling is currently out of the office. Pt was advised he should be back on 12/09/2014 and he would need to see her before prescribing a antibiotic. Pt scheduled for 12/08/2013 at 1015 to see Dr. Loanne Drilling.

## 2014-12-07 NOTE — Telephone Encounter (Signed)
Patient need to talk to you has stated the home health care nurse said she has uti, please advise

## 2014-12-09 ENCOUNTER — Encounter: Payer: Self-pay | Admitting: Endocrinology

## 2014-12-09 ENCOUNTER — Ambulatory Visit (INDEPENDENT_AMBULATORY_CARE_PROVIDER_SITE_OTHER): Payer: Medicare Other | Admitting: Endocrinology

## 2014-12-09 VITALS — BP 134/70 | HR 79 | Temp 98.2°F | Ht 62.0 in | Wt 220.0 lb

## 2014-12-09 DIAGNOSIS — R300 Dysuria: Secondary | ICD-10-CM | POA: Diagnosis not present

## 2014-12-09 LAB — URINALYSIS, ROUTINE W REFLEX MICROSCOPIC
BILIRUBIN URINE: NEGATIVE
KETONES UR: NEGATIVE
NITRITE: NEGATIVE
PH: 6 (ref 5.0–8.0)
Specific Gravity, Urine: <=1.005 — AB (ref 1.000–1.030)
Total Protein, Urine: NEGATIVE
URINE GLUCOSE: NEGATIVE
Urobilinogen, UA: 0.2 (ref 0.0–1.0)

## 2014-12-09 MED ORDER — CIPROFLOXACIN HCL 250 MG PO TABS: 250.0000 mg | ORAL_TABLET | Freq: Two times a day (BID) | ORAL | Status: DC

## 2014-12-09 NOTE — Patient Instructions (Addendum)
check your blood sugar twice a day.  vary the time of day when you check, between before the 3 meals, and at bedtime.  also check if you have symptoms of your blood sugar being too high or too low.  please keep a record of the readings and bring it to your next appointment here.  please call us sooner if your blood sugar goes below 70, or if it stays over 200.  As you write it out, please also write down why it might be higher or lower on a certain day.   Please increase the insulin to 130 units with breakfast and 10 units with evening meal.   On this type of insulin schedule, you should eat meals on a regular schedule.  If a meal is missed or significantly delayed, your blood sugar could go low.  Please come back for a follow-up appointment in 2 weeks.   A urine test is requested for you today.  We'll let you know about the results.

## 2014-12-09 NOTE — Progress Notes (Signed)
Subjective:    Patient ID: Gail Lynch, female    DOB: [birth year], 74 y.o.   MRN: [id number]  HPI Pt returns for f/u of diabetes mellitus: DM type: Insulin-requiring type 2. Dx'ed: 2595 Complications: polyneuropathy and retinopathy. Therapy: insulin since soon after dx. GDM: never. DKA: never Severe hypoglycemia: never Pancreatitis: never Other: she has chosen a simple BID insulin regimen; she takes human insulin, due to cost; she cannot have weight-loss surgery, due to having medicaid. Interval history: She takes only 80 units qam, and 20 units qpm.  no cbg record, but states cbg's are persistently high. Past Medical History  Diagnosis Date  . Hypertension   . Hard of hearing     hear aids - left  . Dysplasia of cervix, low grade (CIN 1)     s/p cervical conization  . HPV (human papilloma virus) infection   . Glaucoma   . Hyperlipidemia   . Colon polyps   . Glaucoma 12/18/2011  . Carotid stenosis 06/18/2012  . PONV (postoperative nausea and vomiting)   . Mycotic toenails 02/19/2013  . SVD (spontaneous vaginal delivery)     x 4  . Asthma     hx as a child - no inhaler, no problems  . Intrinsic asthma, unspecified 12/18/2011  . Diabetes mellitus     Type 2  . Arthritis     gout in feet    Past Surgical History  Procedure Laterality Date  . Knee surgery  2010    patella fracture  . Dilation and curettage of uterus  2000  . Breast biopsy  2010  . Breast biopsy  1991  . Multiple tooth extractions    . Hysteroscopy w/d&c N/A 03/04/2014    Procedure: DILATATION AND CURETTAGE /HYSTEROSCOPY;  Surgeon: Lavonia Drafts, MD;  Location: [facility];  Service: Gynecology;  Laterality: N/A;    History   Social History  . Marital Status: Widowed    Spouse Name: N/A  . Number of Children: 3  . Years of Education: N/A   Occupational History  . retired    Social History Main Topics  . Smoking status: Former Smoker    Types: Cigarettes    Quit date: 08/21/1980  .  Smokeless tobacco: Former Systems developer  . Alcohol Use: No  . Drug Use: No  . Sexual Activity: Yes    Birth Control/ Protection: Post-menopausal   Other Topics Concern  . Not on file   Social History Narrative    Current Outpatient Prescriptions on File Prior to Visit  Medication Sig Dispense Refill  . acetaminophen (TYLENOL) 500 MG tablet Take 1,000 mg by mouth every 6 (six) hours as needed.    Marland Kitchen allopurinol (ZYLOPRIM) 300 MG tablet Take 0.5 tablets (150 mg total) by mouth daily. 45 tablet 3  . atorvastatin (LIPITOR) 40 MG tablet Take 1 tablet (40 mg total) by mouth daily. 90 tablet 3  . Cholecalciferol (VITAMIN D3) 1000 UNITS CAPS Take 1 capsule by mouth daily.    . fish oil-omega-3 fatty acids 1000 MG capsule Take 2 g by mouth daily.    . Flaxseed, Linseed, (FLAX SEED OIL) 1000 MG CAPS Take 2 capsules by mouth daily.     . Garlic 6387 MG CAPS Take 1 capsule by mouth daily.    Marland Kitchen glucose blood (ONE TOUCH ULTRA TEST) test strip 1 each by Other route 2 (two) times daily. And lancets 2/day 250.01 180 each 3  . hydrochlorothiazide (MICROZIDE) 12.5 MG capsule Take 12.5 mg by  mouth daily.    . Misc Natural Products (COLON CLEANSER PO) Take by mouth.    . pyridoxine (CVS VITAMIN B-6) 200 MG tablet Take 200 mg by mouth daily.    . valsartan-hydrochlorothiazide (DIOVAN-HCT) 160-12.5 MG per tablet TAKE 1 TABLET BY MOUTH EVERY DAY 30 tablet 6  . vitamin B-12 (CYANOCOBALAMIN) 500 MCG tablet Take 500 mcg by mouth daily.      No current facility-administered medications on file prior to visit.    Allergies  Allergen Reactions  . Aspirin Other (See Comments)    BLEEDING BEHIND EYES  . Penicillins     Family History  Problem Relation Age of Onset  . Diabetes Brother   . Heart disease Brother   . Heart disease Mother   . Heart disease Father   . Asthma      BP 134/70 mmHg  Pulse 79  Temp(Src) 98.2 F (36.8 C) (Oral)  Ht 5\' 2"  (1.575 m)  Wt 220 lb (99.791 kg)  BMI 40.23 kg/m2  SpO2  97%  Review of Systems She denies hypoglycemia and weight change. She has dysuria, but no fever.      Objective:   Physical Exam VITAL SIGNS:  See vs page GENERAL: no distress Pulses: dorsalis pedis intact bilat.   MSK: no deformity of the feet CV: 1+ bilat leg edema Skin:  no ulcer on the feet.  normal color and temp on the feet. Neuro: sensation is intact to touch on the feet, but decreased from normal   Lab Results  Component Value Date   HGBA1C 10.5* 10/29/2014   UA: pos for UTI    Assessment & Plan:  DM: worse.   Noncompliance with cbg recording and insulin dosing, persistent: we discussed importance of compliance. UTI, new to me.    Patient is advised the following: Patient Instructions  check your blood sugar twice a day.  vary the time of day when you check, between before the 3 meals, and at bedtime.  also check if you have symptoms of your blood sugar being too high or too low.  please keep a record of the readings and bring it to your next appointment here.  please call us sooner if your blood sugar goes below 70, or if it stays over 200.  As you write it out, please also write down why it might be higher or lower on a certain day.   Please increase the insulin to 130 units with breakfast and 10 units with evening meal.   On this type of insulin schedule, you should eat meals on a regular schedule.  If a meal is missed or significantly delayed, your blood sugar could go low.  Please come back for a follow-up appointment in 2 weeks.   A urine test is requested for you today.  We'll let you know about the results.   addendum: i have sent a prescription to your pharmacy, for an antibiotic.

## 2014-12-10 ENCOUNTER — Ambulatory Visit: Payer: Medicare Other | Admitting: Internal Medicine

## 2014-12-11 ENCOUNTER — Ambulatory Visit (INDEPENDENT_AMBULATORY_CARE_PROVIDER_SITE_OTHER): Payer: Medicare Other

## 2014-12-11 DIAGNOSIS — E1040 Type 1 diabetes mellitus with diabetic neuropathy, unspecified: Secondary | ICD-10-CM

## 2014-12-11 DIAGNOSIS — M79676 Pain in unspecified toe(s): Secondary | ICD-10-CM

## 2014-12-11 DIAGNOSIS — B351 Tinea unguium: Secondary | ICD-10-CM

## 2014-12-11 LAB — CULTURE, URINE COMPREHENSIVE
Colony Count: NO GROWTH
Organism ID, Bacteria: NO GROWTH

## 2014-12-11 NOTE — Progress Notes (Signed)
HPI: Patient presents today for follow up of diabetic foot and nail care. Past medical history, meds, and allergies reviewed. Patient states blood sugar is under fair control.  Objective: Objective: Patients chart is reviewed. Vascular status reveals pedal pulses noted at 1 out of 4 dp and 0/4 pt bilateral . Neurological sensation is Normal to [employer] monofilament bilateral at 3/5 sites bilateral. Dermatological exam reveals absence of pre ulcerative/ hyperkeratotic lesions. Toenails are elongated, incurvated, discolored, dystrophic with ingrown deformity present. Moderate edema present today  Assessment: Diabetes with Neuropathy/Angiopathy , Ingrown nail deformity, edema  Plan: Discussed treatment options and alternatives. Debrided nails without complication. She was measured and scanned for diabetic shoes and inserts. Return appointment recommended at routine intervals of 3 months.

## 2014-12-23 ENCOUNTER — Encounter: Payer: Self-pay | Admitting: Endocrinology

## 2014-12-23 ENCOUNTER — Ambulatory Visit (INDEPENDENT_AMBULATORY_CARE_PROVIDER_SITE_OTHER): Payer: Medicare Other | Admitting: Endocrinology

## 2014-12-23 VITALS — BP 136/64 | HR 89 | Temp 98.6°F | Ht 62.0 in | Wt 218.0 lb

## 2014-12-23 DIAGNOSIS — E1065 Type 1 diabetes mellitus with hyperglycemia: Principal | ICD-10-CM

## 2014-12-23 DIAGNOSIS — E1039 Type 1 diabetes mellitus with other diabetic ophthalmic complication: Secondary | ICD-10-CM | POA: Diagnosis not present

## 2014-12-23 DIAGNOSIS — IMO0002 Reserved for concepts with insufficient information to code with codable children: Secondary | ICD-10-CM

## 2014-12-23 MED ORDER — INSULIN GLARGINE 100 UNIT/ML SOLOSTAR PEN: 90.0000 [IU] | PEN_INJECTOR | SUBCUTANEOUS | Status: DC

## 2014-12-23 NOTE — Patient Instructions (Addendum)
check your blood sugar twice a day.  vary the time of day when you check, between before the 3 meals, and at bedtime.  also check if you have symptoms of your blood sugar being too high or too low.  please keep a record of the readings and bring it to your next appointment here.  please call us sooner if your blood sugar goes below 70, or if it stays over 200.  As you write it out, please also write down why it might be higher or lower on a certain day.   Please change the insulin to lantus, 90 units each morning.  i have sent a prescription to your pharmacy. On this type of insulin schedule, you should eat meals on a regular schedule.  If a meal is missed or significantly delayed, your blood sugar could go low.  Please come back for a follow-up appointment in 1 month.

## 2014-12-23 NOTE — Progress Notes (Signed)
Subjective:    Patient ID: Gail Lynch, female    DOB: 05/01/1941, 74 y.o.   MRN: [id number]  HPI Pt returns for f/u of diabetes mellitus: DM type: Insulin-requiring type 2. Dx'ed: 7408 Complications: polyneuropathy and retinopathy. Therapy: insulin since soon after dx. GDM: never. DKA: never Severe hypoglycemia: never Pancreatitis: never Other: she has chosen a simple BID insulin regimen; she takes human insulin, due to cost; she cannot have weight-loss surgery, due to having medicaid. Interval history: She takes only 90 units qam, and 10 units qpm.  She says her diet is much better.  she brings a record of her cbg's which i have reviewed today.  It varies from 59-200's.  It is lowest in the afternoon. Past Medical History  Diagnosis Date  . Hypertension   . Hard of hearing     hear aids - left  . Dysplasia of cervix, low grade (CIN 1)     s/p cervical conization  . HPV (human papilloma virus) infection   . Glaucoma   . Hyperlipidemia   . Colon polyps   . Glaucoma 12/18/2011  . Carotid stenosis 06/18/2012  . PONV (postoperative nausea and vomiting)   . Mycotic toenails 02/19/2013  . SVD (spontaneous vaginal delivery)     x 4  . Asthma     hx as a child - no inhaler, no problems  . Intrinsic asthma, unspecified 12/18/2011  . Diabetes mellitus     Type 2  . Arthritis     gout in feet    Past Surgical History  Procedure Laterality Date  . Knee surgery  2010    patella fracture  . Dilation and curettage of uterus  2000  . Breast biopsy  2010  . Breast biopsy  1991  . Multiple tooth extractions    . Hysteroscopy w/d&c N/A 03/04/2014    Procedure: DILATATION AND CURETTAGE /HYSTEROSCOPY;  Surgeon: Lavonia Drafts, MD;  Location: [facility];  Service: Gynecology;  Laterality: N/A;    History   Social History  . Marital Status: Widowed    Spouse Name: N/A  . Number of Children: 3  . Years of Education: N/A   Occupational History  . retired    Social History  Main Topics  . Smoking status: Former Smoker    Types: Cigarettes    Quit date: 08/21/1980  . Smokeless tobacco: Former Systems developer  . Alcohol Use: No  . Drug Use: No  . Sexual Activity: Yes    Birth Control/ Protection: Post-menopausal   Other Topics Concern  . Not on file   Social History Narrative    Current Outpatient Prescriptions on File Prior to Visit  Medication Sig Dispense Refill  . acetaminophen (TYLENOL) 500 MG tablet Take 1,000 mg by mouth every 6 (six) hours as needed.    Marland Kitchen allopurinol (ZYLOPRIM) 300 MG tablet Take 0.5 tablets (150 mg total) by mouth daily. 45 tablet 3  . atorvastatin (LIPITOR) 40 MG tablet Take 1 tablet (40 mg total) by mouth daily. 90 tablet 3  . Cholecalciferol (VITAMIN D3) 1000 UNITS CAPS Take 1 capsule by mouth daily.    . ciprofloxacin (CIPRO) 250 MG tablet Take 1 tablet (250 mg total) by mouth 2 (two) times daily. 14 tablet 0  . fish oil-omega-3 fatty acids 1000 MG capsule Take 2 g by mouth daily.    . Flaxseed, Linseed, (FLAX SEED OIL) 1000 MG CAPS Take 2 capsules by mouth daily.     . Garlic 1448 MG CAPS  Take 1 capsule by mouth daily.    Marland Kitchen glucose blood (ONE TOUCH ULTRA TEST) test strip 1 each by Other route 2 (two) times daily. And lancets 2/day 250.01 180 each 3  . hydrochlorothiazide (MICROZIDE) 12.5 MG capsule Take 12.5 mg by mouth daily.    . Misc Natural Products (COLON CLEANSER PO) Take by mouth.    . pyridoxine (CVS VITAMIN B-6) 200 MG tablet Take 200 mg by mouth daily.    . valsartan-hydrochlorothiazide (DIOVAN-HCT) 160-12.5 MG per tablet TAKE 1 TABLET BY MOUTH EVERY DAY 30 tablet 6  . vitamin B-12 (CYANOCOBALAMIN) 500 MCG tablet Take 500 mcg by mouth daily.      No current facility-administered medications on file prior to visit.    Allergies  Allergen Reactions  . Aspirin Other (See Comments)    BLEEDING BEHIND EYES  . Penicillins     Family History  Problem Relation Age of Onset  . Diabetes Brother   . Heart disease Brother     . Heart disease Mother   . Heart disease Father   . Asthma      BP 136/64 mmHg  Pulse 89  Temp(Src) 98.6 F (37 C) (Oral)  Ht 5\' 2"  (1.575 m)  Wt 218 lb (98.884 kg)  BMI 39.86 kg/m2  SpO2 97%    Review of Systems Denies LOC.  She has lost a few lbs.     Objective:   Physical Exam VITAL SIGNS:  See vs page GENERAL: no distress SKIN:  Insulin injection sites at the anterior abdomen are normal.     Lab Results  Component Value Date   HGBA1C 10.5* 10/29/2014      Assessment & Plan:  DM: insulin requirement is recently reduced, prob due to her recently improved diet and cipro.  Also, the pattern of her cbg's indicates she needs a slower-acting insulin.  Patient is advised the following: Patient Instructions  check your blood sugar twice a day.  vary the time of day when you check, between before the 3 meals, and at bedtime.  also check if you have symptoms of your blood sugar being too high or too low.  please keep a record of the readings and bring it to your next appointment here.  please call us sooner if your blood sugar goes below 70, or if it stays over 200.  As you write it out, please also write down why it might be higher or lower on a certain day.   Please change the insulin to lantus, 90 units each morning.  i have sent a prescription to your pharmacy. On this type of insulin schedule, you should eat meals on a regular schedule.  If a meal is missed or significantly delayed, your blood sugar could go low.  Please come back for a follow-up appointment in 1 month.

## 2014-12-24 ENCOUNTER — Telehealth: Payer: Self-pay | Admitting: Obstetrics & Gynecology

## 2014-12-24 NOTE — Telephone Encounter (Signed)
TC to pt to discuss recent sono from 11/2014.  Pt reports that she still has no bleeding. Her current endometrial stripe is 1.7cm which appears thickened.  She has had a hysteroscopy x2.  She has had a thickened stripe as far back as 2000 and she reports that she is not having any GYN problems and is not worried about it. Pt encouraged to call and f/u if she has ANY GYN bleeding.  Otherwise she was asked to f/u in 6 months.  Pt had no further questions.  Taygan Connell L. Harraway-Smith, M.D., Cherlynn June

## 2014-12-25 ENCOUNTER — Other Ambulatory Visit: Payer: Self-pay | Admitting: Internal Medicine

## 2014-12-28 ENCOUNTER — Ambulatory Visit (AMBULATORY_SURGERY_CENTER): Payer: Self-pay | Admitting: *Deleted

## 2014-12-28 ENCOUNTER — Other Ambulatory Visit: Payer: Self-pay | Admitting: Endocrinology

## 2014-12-28 VITALS — Ht 62.0 in | Wt 219.0 lb

## 2014-12-28 DIAGNOSIS — Z86010 Personal history of colonic polyps: Secondary | ICD-10-CM

## 2014-12-28 NOTE — Telephone Encounter (Signed)
Please advise if ok to refill rx. Medication is not on current medication list. Thanks!

## 2014-12-28 NOTE — Progress Notes (Signed)
No egg or soy allergy No diet pills No home 02 use No issues with sedation but with post op after knee surgery had N/V

## 2014-12-29 ENCOUNTER — Telehealth: Payer: Self-pay

## 2014-12-29 MED ORDER — INSULIN GLARGINE 100 UNIT/ML SOLOSTAR PEN: 90.0000 [IU] | PEN_INJECTOR | SUBCUTANEOUS | Status: DC

## 2014-12-29 NOTE — Telephone Encounter (Signed)
Patient called wanting to know if she should be taking the Humulin N or the Lantus. Rx for Lantus was sent on 5/4 and rx for Humulin N was sent on 5/9. Thanks!

## 2014-12-29 NOTE — Telephone Encounter (Signed)
She was changed to lantus.  i have resent the prescription to your pharmacy

## 2014-12-29 NOTE — Telephone Encounter (Signed)
Patient advised to pick up the Humulin and not to pick up the Lantus. Patient voiced understanding.

## 2015-01-07 ENCOUNTER — Telehealth: Payer: Self-pay | Admitting: Gastroenterology

## 2015-01-07 NOTE — Telephone Encounter (Signed)
Pt took a pain medication because her hip was twisted this past week and was sore and she was making sure that it would not affect her colon procedure  I advised her that it would be fine to continue with the plan to have the colon.

## 2015-01-11 ENCOUNTER — Encounter: Payer: Self-pay | Admitting: Gastroenterology

## 2015-01-11 ENCOUNTER — Ambulatory Visit (AMBULATORY_SURGERY_CENTER): Payer: Medicare Other | Admitting: Gastroenterology

## 2015-01-11 VITALS — BP 135/69 | HR 86 | Temp 96.8°F | Resp 18 | Ht 62.0 in | Wt 219.0 lb

## 2015-01-11 DIAGNOSIS — D129 Benign neoplasm of anus and anal canal: Secondary | ICD-10-CM | POA: Diagnosis not present

## 2015-01-11 DIAGNOSIS — Z86010 Personal history of colonic polyps: Secondary | ICD-10-CM | POA: Diagnosis present

## 2015-01-11 DIAGNOSIS — I10 Essential (primary) hypertension: Secondary | ICD-10-CM | POA: Diagnosis not present

## 2015-01-11 DIAGNOSIS — D128 Benign neoplasm of rectum: Secondary | ICD-10-CM

## 2015-01-11 DIAGNOSIS — K621 Rectal polyp: Secondary | ICD-10-CM | POA: Diagnosis not present

## 2015-01-11 LAB — GLUCOSE, CAPILLARY
GLUCOSE-CAPILLARY: 263 mg/dL — AB (ref 65–99)
Glucose-Capillary: 274 mg/dL — ABNORMAL HIGH (ref 65–99)

## 2015-01-11 MED ORDER — SODIUM CHLORIDE 0.9 % IV SOLN: 500.0000 mL | INTRAVENOUS | Status: DC

## 2015-01-11 NOTE — Progress Notes (Signed)
Called to room to assist during endoscopic procedure.  Patient ID and intended procedure confirmed with present staff. Received instructions for my participation in the procedure from the performing physician.  

## 2015-01-11 NOTE — Progress Notes (Signed)
Report to PACU, RN, vss, BBS= Clear.  

## 2015-01-11 NOTE — Progress Notes (Addendum)
Pt's heart rate slightly irregular- 78-90 on monitor.  Apical pulse rate- 80 and few irregular beats heard.  Pt states, "this has happened to be me before. I saw my doctor for it."  She denies chest discomfort of any kind; J Monday CRNA made aware of this.  Pt has a pad on.  She states, "I am spotting off and on."  She states she is unsure why she is spotting.  I noted in her chart that her OBGYN (per T.E. On 12-24-14), she is to call if she has more bleeding.  She states she cannot see that OBGYN because her insurance has changed.  I told her to call her family MD who can recommend a new OBGYN for her.

## 2015-01-11 NOTE — Op Note (Signed)
[place]  [address] [place], [id number]   COLONOSCOPY PROCEDURE REPORT  PATIENT: Gail Lynch, Gail Lynch  MR#: [id number] BIRTHDATE: Dec 22, 1940 , 65  yrs. old GENDER: female ENDOSCOPIST: Milus Banister, MD PROCEDURE DATE:  01/11/2015 PROCEDURE:   Colonoscopy, surveillance and Colonoscopy with snare polypectomy First Screening Colonoscopy - Avg.  risk and is 50 yrs.  old or older - No.  Prior Negative Screening - Now for repeat screening. N/A  History of Adenoma - Now for follow-up colonoscopy & has been > or = to 3 yrs.  Yes hx of adenoma.  Has been 3 or more years since last colonoscopy.  Polyps removed today? Yes ASA CLASS:   Class II INDICATIONS:Surveillance due to prior colonic neoplasia and Colonoscopy 2013 Dr.  Ardis Hughs, three polyps removed that were all adenomatous. MEDICATIONS: Monitored anesthesia care and Propofol 200 mg IV  DESCRIPTION OF PROCEDURE:   After the risks benefits and alternatives of the procedure were thoroughly explained, informed consent was obtained.  The digital rectal exam revealed no abnormalities of the rectum.   The LB [id number]  endoscope was introduced through the anus and advanced to the cecum, which was identified by both the appendix and ileocecal valve. No adverse events experienced.   The quality of the prep was excellent.  The instrument was then slowly withdrawn as the colon was fully examined. Estimated blood loss is zero unless otherwise noted in this procedure report.   COLON FINDINGS: A sessile polyp measuring 4 mm in size was found in the rectum.  A polypectomy was performed with a cold snare.  The resection was complete, the polyp tissue was completely retrieved and sent to histology.   1cm lipoma in right colon.   The examination was otherwise normal.  Retroflexed views revealed no abnormalities. The time to cecum = 5.0 Withdrawal time = 8.4   The scope was withdrawn and the procedure  completed. COMPLICATIONS: There were no immediate complications.  ENDOSCOPIC IMPRESSION: 1.   Sessile polyp was found in the rectum; polypectomy was performed with a cold snare 2.   1cm lipoma in right colon 3.   The examination was otherwise normal  RECOMMENDATIONS: If the polyp(s) removed today are proven to be adenomatous (pre-cancerous) polyps, you will need a repeat colonoscopy in 5 years.   You will receive a letter within 1-2 weeks with the results of your biopsy as well as final recommendations.  Please call my office if you have not received a letter after 3 weeks.  eSigned:  Milus Banister, MD 01/11/2015 10:05 AM   cc: Cathlean Cower, MD

## 2015-01-11 NOTE — Patient Instructions (Signed)
Discharge instructions given. Handout on polyps. Resume previous medications. YOU HAD AN ENDOSCOPIC PROCEDURE TODAY AT THE [facility]:   Refer to the procedure report that was given to you for any specific questions about what was found during the examination.  If the procedure report does not answer your questions, please call your gastroenterologist to clarify.  If you requested that your care partner not be given the details of your procedure findings, then the procedure report has been included in a sealed envelope for you to review at your convenience later.  YOU SHOULD EXPECT: Some feelings of bloating in the abdomen. Passage of more gas than usual.  Walking can help get rid of the air that was put into your GI tract during the procedure and reduce the bloating. If you had a lower endoscopy (such as a colonoscopy or flexible sigmoidoscopy) you may notice spotting of blood in your stool or on the toilet paper. If you underwent a bowel prep for your procedure, you may not have a normal bowel movement for a few days.  Please Note:  You might notice some irritation and congestion in your nose or some drainage.  This is from the oxygen used during your procedure.  There is no need for concern and it should clear up in a day or so.  SYMPTOMS TO REPORT IMMEDIATELY:   Following lower endoscopy (colonoscopy or flexible sigmoidoscopy):  Excessive amounts of blood in the stool  Significant tenderness or worsening of abdominal pains  Swelling of the abdomen that is new, acute  Fever of 100F or higher   For urgent or emergent issues, a gastroenterologist can be reached at any hour by calling [phone].   DIET: Your first meal following the procedure should be a small meal and then it is ok to progress to your normal diet. Heavy or fried foods are harder to digest and may make you feel nauseous or bloated.  Likewise, meals heavy in dairy and vegetables can increase bloating.  Drink  plenty of fluids but you should avoid alcoholic beverages for 24 hours.  ACTIVITY:  You should plan to take it easy for the rest of today and you should NOT DRIVE or use heavy machinery until tomorrow (because of the sedation medicines used during the test).    FOLLOW UP: Our staff will call the number listed on your records the next business day following your procedure to check on you and address any questions or concerns that you may have regarding the information given to you following your procedure. If we do not reach you, we will leave a message.  However, if you are feeling well and you are not experiencing any problems, there is no need to return our call.  We will assume that you have returned to your regular daily activities without incident.  If any biopsies were taken you will be contacted by phone or by letter within the next 1-3 weeks.  Please call us at [phone] if you have not heard about the biopsies in 3 weeks.    SIGNATURES/CONFIDENTIALITY: You and/or your care partner have signed paperwork which will be entered into your electronic medical record.  These signatures attest to the fact that that the information above on your After Visit Summary has been reviewed and is understood.  Full responsibility of the confidentiality of this discharge information lies with you and/or your care-partner. 

## 2015-01-12 ENCOUNTER — Telehealth: Payer: Self-pay | Admitting: *Deleted

## 2015-01-12 NOTE — Telephone Encounter (Signed)
  Follow up Call-  Call back number 01/11/2015  Post procedure Call Back phone  # [phone]  Permission to leave phone message Yes     Patient questions:  Do you have a fever, pain , or abdominal swelling? No. Pain Score  0 *  Have you tolerated food without any problems? Yes.    Have you been able to return to your normal activities? Yes.    Do you have any questions about your discharge instructions: Diet   No. Medications  No. Follow up visit  No.  Do you have questions or concerns about your Care? No.  Actions: * If pain score is 4 or above: No action needed, pain <4.

## 2015-01-14 ENCOUNTER — Encounter: Payer: Self-pay | Admitting: Gastroenterology

## 2015-01-26 ENCOUNTER — Other Ambulatory Visit: Payer: Self-pay | Admitting: Endocrinology

## 2015-01-28 ENCOUNTER — Other Ambulatory Visit: Payer: Self-pay | Admitting: Internal Medicine

## 2015-01-28 DIAGNOSIS — R921 Mammographic calcification found on diagnostic imaging of breast: Secondary | ICD-10-CM

## 2015-01-29 ENCOUNTER — Encounter: Payer: Self-pay | Admitting: Endocrinology

## 2015-01-29 ENCOUNTER — Ambulatory Visit (INDEPENDENT_AMBULATORY_CARE_PROVIDER_SITE_OTHER): Payer: Medicare Other | Admitting: Endocrinology

## 2015-01-29 VITALS — BP 122/60 | HR 79 | Temp 98.4°F | Wt 217.0 lb

## 2015-01-29 DIAGNOSIS — IMO0002 Reserved for concepts with insufficient information to code with codable children: Secondary | ICD-10-CM

## 2015-01-29 DIAGNOSIS — E1039 Type 1 diabetes mellitus with other diabetic ophthalmic complication: Secondary | ICD-10-CM

## 2015-01-29 DIAGNOSIS — E1065 Type 1 diabetes mellitus with hyperglycemia: Principal | ICD-10-CM

## 2015-01-29 LAB — HEMOGLOBIN A1C: Hgb A1c MFr Bld: 9.9 % — ABNORMAL HIGH (ref 4.6–6.5)

## 2015-01-29 MED ORDER — INSULIN REGULAR HUMAN 100 UNIT/ML IJ SOLN: 10.0000 [IU] | Freq: Every day | INTRAMUSCULAR | Status: DC

## 2015-01-29 NOTE — Patient Instructions (Addendum)
check your blood sugar twice a day.  vary the time of day when you check, between before the 3 meals, and at bedtime.  also check if you have symptoms of your blood sugar being too high or too low.  please keep a record of the readings and bring it to your next appointment here.  please call us sooner if your blood sugar goes below 70, or if it stays over 200.  As you write it out, please also write down why it might be higher or lower on a certain day.   blood tests are requested for you today.  We'll let you know about the results.   Based on the results, we may need to change to NPH in the morning, and R with the evening meal.   On this type of insulin schedule, you should eat meals on a regular schedule.  If a meal is missed or significantly delayed, your blood sugar could go low.  Please come back for a follow-up appointment in 3 months.

## 2015-01-29 NOTE — Progress Notes (Signed)
Subjective:    Patient ID: Gail Lynch, female    DOB: [birth year], 74 y.o.   MRN: [id number]  HPI Pt returns for f/u of diabetes mellitus: DM type: Insulin-requiring type 2. Dx'ed: 0802 Complications: polyneuropathy and retinopathy. Therapy: insulin since soon after dx. GDM: never. DKA: never Severe hypoglycemia: never Pancreatitis: never Other: she has chosen a simple insulin regimen; she takes human insulin, due to cost; she cannot have weight-loss surgery, due to having medicaid. Interval history: She takes only 70 units qam, and 10-15 units qpm (sliding-scale).  she brings a record of her cbg's which i have reviewed today.  It varies from 104-300's.  It is lowest in the afternoon, and highest at hs.  She does not check at lunch.  pt states she feels well in general.  Past Medical History  Diagnosis Date  . Hypertension   . Hard of hearing     hear aids - left  . Dysplasia of cervix, low grade (CIN 1)     s/p cervical conization  . HPV (human papilloma virus) infection   . Glaucoma   . Hyperlipidemia   . Colon polyps   . Glaucoma 12/18/2011  . Carotid stenosis 06/18/2012  . PONV (postoperative nausea and vomiting)   . Mycotic toenails 02/19/2013  . SVD (spontaneous vaginal delivery)     x 4  . Asthma     hx as a child - no inhaler, no problems  . Intrinsic asthma, unspecified 12/18/2011  . Diabetes mellitus     Type 2  . Arthritis     gout in feet    Past Surgical History  Procedure Laterality Date  . Knee surgery  2010    patella fracture  . Dilation and curettage of uterus  2000  . Breast biopsy  2010  . Breast biopsy  1991  . Multiple tooth extractions    . Hysteroscopy w/d&c N/A 03/04/2014    Procedure: DILATATION AND CURETTAGE /HYSTEROSCOPY;  Surgeon: Lavonia Drafts, MD;  Location: [facility];  Service: Gynecology;  Laterality: N/A;  . Colonoscopy    . Polypectomy      History   Social History  . Marital Status: Widowed    Spouse Name: N/A  .  Number of Children: 3  . Years of Education: N/A   Occupational History  . retired    Social History Main Topics  . Smoking status: Former Smoker    Types: Cigarettes    Quit date: 08/21/1980  . Smokeless tobacco: Former Systems developer  . Alcohol Use: No  . Drug Use: No  . Sexual Activity: Yes    Birth Control/ Protection: Post-menopausal   Other Topics Concern  . Not on file   Social History Narrative    Current Outpatient Prescriptions on File Prior to Visit  Medication Sig Dispense Refill  . acetaminophen (TYLENOL) 500 MG tablet Take 1,000 mg by mouth every 6 (six) hours as needed.    Marland Kitchen allopurinol (ZYLOPRIM) 300 MG tablet Take 0.5 tablets (150 mg total) by mouth daily. 45 tablet 3  . atorvastatin (LIPITOR) 40 MG tablet TAKE 1 TABLET BY MOUTH DAILY 90 tablet 3  . Cholecalciferol (VITAMIN D3) 1000 UNITS CAPS Take 1 capsule by mouth daily.    . fish oil-omega-3 fatty acids 1000 MG capsule Take 2 g by mouth daily.    . Flaxseed, Linseed, (FLAX SEED OIL) 1000 MG CAPS Take 2 capsules by mouth daily.     . Garlic 2336 MG CAPS Take 1 capsule  by mouth daily.    Marland Kitchen glucose blood (ONE TOUCH ULTRA TEST) test strip 1 each by Other route 2 (two) times daily. And lancets 2/day 250.01 180 each 3  . Misc Natural Products (COLON CLEANSER PO) Take by mouth.    . polyethylene glycol (MIRALAX / GLYCOLAX) packet Take 17 g by mouth daily as needed.     . pyridoxine (CVS VITAMIN B-6) 200 MG tablet Take 200 mg by mouth daily.    . valsartan-hydrochlorothiazide (DIOVAN-HCT) 160-12.5 MG per tablet TAKE 1 TABLET BY MOUTH EVERY DAY 30 tablet 6  . vitamin B-12 (CYANOCOBALAMIN) 500 MCG tablet Take 500 mcg by mouth daily.      No current facility-administered medications on file prior to visit.    Allergies  Allergen Reactions  . Aspirin Other (See Comments)    BLEEDING BEHIND EYES  . Penicillins     Family History  Problem Relation Age of Onset  . Diabetes Brother   . Heart disease Brother   . Heart  disease Mother   . Heart disease Father   . Asthma    . Colon cancer Neg Hx   . Rectal cancer Neg Hx   . Stomach cancer Neg Hx   . Esophageal cancer Neg Hx     BP 122/60 mmHg  Pulse 79  Temp(Src) 98.4 F (36.9 C) (Oral)  Wt 217 lb (98.431 kg)  SpO2 98%    Review of Systems She denies hypoglycemia     Objective:   Physical Exam VITAL SIGNS:  See vs page GENERAL: no distress Pulses: dorsalis pedis intact bilat.   MSK: no deformity of the feet CV: 1+ bilat leg edema Skin:  no ulcer on the feet.  normal color and temp on the feet. Neuro: sensation is intact to touch on the feet.  Ext: There is bilateral onychomycosis of the toenails.    Lab Results  Component Value Date   HGBA1C 9.9* 01/29/2015      Assessment & Plan:  DM: pt has requested a simple and inexpensive regimen.  However, the pattern of her cbg's indicates she needs reg with supper, not NPH.  She agrees to take.  Patient is advised the following: Patient Instructions  check your blood sugar twice a day.  vary the time of day when you check, between before the 3 meals, and at bedtime.  also check if you have symptoms of your blood sugar being too high or too low.  please keep a record of the readings and bring it to your next appointment here.  please call us sooner if your blood sugar goes below 70, or if it stays over 200.  As you write it out, please also write down why it might be higher or lower on a certain day.   blood tests are requested for you today.  We'll let you know about the results.   Based on the results, we may need to change to NPH in the morning, and R with the evening meal.   On this type of insulin schedule, you should eat meals on a regular schedule.  If a meal is missed or significantly delayed, your blood sugar could go low.  Please come back for a follow-up appointment in 3 months.      Addendum: continue the same NPH insulin (70 units qam).  Change the evening insulin to REG, 10  units with supper

## 2015-02-01 ENCOUNTER — Telehealth: Payer: Self-pay | Admitting: Endocrinology

## 2015-02-01 NOTE — Telephone Encounter (Signed)
Pt needs clarification on the message that was left

## 2015-02-02 ENCOUNTER — Ambulatory Visit
Admission: RE | Admit: 2015-02-02 | Discharge: 2015-02-02 | Disposition: A | Discharge status: Home / Self Care | Payer: Medicare Other | Source: Ambulatory Visit | Attending: Internal Medicine | Admitting: Internal Medicine

## 2015-02-02 ENCOUNTER — Other Ambulatory Visit: Payer: Self-pay | Admitting: Internal Medicine

## 2015-02-02 DIAGNOSIS — R921 Mammographic calcification found on diagnostic imaging of breast: Secondary | ICD-10-CM

## 2015-02-05 DIAGNOSIS — E109 Type 1 diabetes mellitus without complications: Secondary | ICD-10-CM | POA: Diagnosis not present

## 2015-02-09 DIAGNOSIS — E11311 Type 2 diabetes mellitus with unspecified diabetic retinopathy with macular edema: Secondary | ICD-10-CM | POA: Diagnosis not present

## 2015-02-09 DIAGNOSIS — E11329 Type 2 diabetes mellitus with mild nonproliferative diabetic retinopathy without macular edema: Secondary | ICD-10-CM | POA: Diagnosis not present

## 2015-02-09 DIAGNOSIS — Z961 Presence of intraocular lens: Secondary | ICD-10-CM | POA: Diagnosis not present

## 2015-02-10 ENCOUNTER — Other Ambulatory Visit: Payer: Self-pay | Admitting: Endocrinology

## 2015-02-10 DIAGNOSIS — I6523 Occlusion and stenosis of bilateral carotid arteries: Secondary | ICD-10-CM

## 2015-02-17 ENCOUNTER — Ambulatory Visit (HOSPITAL_COMMUNITY): Payer: Medicare Other | Attending: Cardiovascular Disease

## 2015-02-17 DIAGNOSIS — I6523 Occlusion and stenosis of bilateral carotid arteries: Secondary | ICD-10-CM | POA: Diagnosis not present

## 2015-02-17 DIAGNOSIS — Z87891 Personal history of nicotine dependence: Secondary | ICD-10-CM | POA: Insufficient documentation

## 2015-02-17 DIAGNOSIS — I10 Essential (primary) hypertension: Secondary | ICD-10-CM | POA: Diagnosis not present

## 2015-02-17 DIAGNOSIS — E119 Type 2 diabetes mellitus without complications: Secondary | ICD-10-CM | POA: Insufficient documentation

## 2015-02-17 DIAGNOSIS — E785 Hyperlipidemia, unspecified: Secondary | ICD-10-CM | POA: Diagnosis not present

## 2015-02-24 ENCOUNTER — Other Ambulatory Visit: Payer: Self-pay | Admitting: Endocrinology

## 2015-03-11 ENCOUNTER — Other Ambulatory Visit: Payer: Self-pay | Admitting: Internal Medicine

## 2015-03-12 ENCOUNTER — Telehealth: Payer: Self-pay

## 2015-03-12 ENCOUNTER — Telehealth: Payer: Self-pay | Admitting: Endocrinology

## 2015-03-12 ENCOUNTER — Ambulatory Visit: Payer: Medicare Other

## 2015-03-12 NOTE — Telephone Encounter (Signed)
Currently taking only Humulin N  Pt and [employer] case nurse called stating the pt had been experiencing SOB for 3 days due to her taking Humulin R.Pt stated she did not take it all day and feels better.Can she stay off this medication?Please advise

## 2015-03-12 NOTE — Telephone Encounter (Signed)
Please let us know if you are having any low blood sugar. If not, the sob must be coming from some other cause, and it should be evaluated separately.

## 2015-03-12 NOTE — Telephone Encounter (Signed)
Pt states she became SOB for 3 days since starting the Humulin R, call sent to [facility]

## 2015-03-15 NOTE — Telephone Encounter (Signed)
I contacted the pt and advised of note below. Pt stated she would call back if she started to have low blood sugar readings.

## 2015-03-24 ENCOUNTER — Other Ambulatory Visit: Payer: Self-pay | Admitting: Endocrinology

## 2015-04-06 ENCOUNTER — Ambulatory Visit (INDEPENDENT_AMBULATORY_CARE_PROVIDER_SITE_OTHER): Payer: Medicare Other | Admitting: Podiatry

## 2015-04-06 ENCOUNTER — Encounter: Payer: Self-pay | Admitting: Podiatry

## 2015-04-06 DIAGNOSIS — B351 Tinea unguium: Secondary | ICD-10-CM | POA: Diagnosis not present

## 2015-04-06 DIAGNOSIS — E1151 Type 2 diabetes mellitus with diabetic peripheral angiopathy without gangrene: Secondary | ICD-10-CM | POA: Diagnosis not present

## 2015-04-06 DIAGNOSIS — M79673 Pain in unspecified foot: Secondary | ICD-10-CM | POA: Diagnosis not present

## 2015-04-06 NOTE — Patient Instructions (Signed)
Diabetes and Foot Care Diabetes may cause you to have problems because of poor blood supply (circulation) to your feet and legs. This may cause the skin on your feet to become thinner, break easier, and heal more slowly. Your skin may become dry, and the skin may peel and crack. You may also have nerve damage in your legs and feet causing decreased feeling in them. You may not notice minor injuries to your feet that could lead to infections or more serious problems. Taking care of your feet is one of the most important things you can do for yourself.  HOME CARE INSTRUCTIONS  Wear shoes at all times, even in the house. Do not go barefoot. Bare feet are easily injured.  Check your feet daily for blisters, cuts, and redness. If you cannot see the bottom of your feet, use a mirror or ask someone for help.  Wash your feet with warm water (do not use hot water) and mild soap. Then pat your feet and the areas between your toes until they are completely dry. Do not soak your feet as this can dry your skin.  Apply a moisturizing lotion or petroleum jelly (that does not contain alcohol and is unscented) to the skin on your feet and to dry, brittle toenails. Do not apply lotion between your toes.  Trim your toenails straight across. Do not dig under them or around the cuticle. File the edges of your nails with an emery board or nail file.  Do not cut corns or calluses or try to remove them with medicine.  Wear clean socks or stockings every day. Make sure they are not too tight. Do not wear knee-high stockings since they may decrease blood flow to your legs.  Wear shoes that fit properly and have enough cushioning. To break in new shoes, wear them for just a few hours a day. This prevents you from injuring your feet. Always look in your shoes before you put them on to be sure there are no objects inside.  Do not cross your legs. This may decrease the blood flow to your feet.  If you find a minor scrape,  cut, or break in the skin on your feet, keep it and the skin around it clean and dry. These areas may be cleansed with mild soap and water. Do not cleanse the area with peroxide, alcohol, or iodine.  When you remove an adhesive bandage, be sure not to damage the skin around it.  If you have a wound, look at it several times a day to make sure it is healing.  Do not use heating pads or hot water bottles. They may burn your skin. If you have lost feeling in your feet or legs, you may not know it is happening until it is too late.  Make sure your health care provider performs a complete foot exam at least annually or more often if you have foot problems. Report any cuts, sores, or bruises to your health care provider immediately. SEEK MEDICAL CARE IF:   You have an injury that is not healing.  You have cuts or breaks in the skin.  You have an ingrown nail.  You notice redness on your legs or feet.  You feel burning or tingling in your legs or feet.  You have pain or cramps in your legs and feet.  Your legs or feet are numb.  Your feet always feel cold. SEEK IMMEDIATE MEDICAL CARE IF:   There is increasing redness,   swelling, or pain in or around a wound.  There is a red line that goes up your leg.  Pus is coming from a wound.  You develop a fever or as directed by your health care provider.  You notice a bad smell coming from an ulcer or wound. Document Released: 08/04/2000 Document Revised: 04/09/2013 Document Reviewed: 01/14/2013 Exit[employer] Patient Information 2015 [employer]. This information is not intended to replace advice given to you by your health care provider. Make sure you discuss any questions you have with your health care provider.  

## 2015-04-06 NOTE — Progress Notes (Signed)
Patient ID: Gail Lynch, female   DOB: Dec 23, 1940, 74 y.o.   MRN: [id number]  This patient presents today and requests toenail debridement. The last visit for this similar service was on 06/11/2014 Patient has a history of diabetes with neuropathy and angiopathy  Objective: DP pulses trace palpable bilaterally PT pulses 0/4 bilaterally Capillary reflex immediate bilaterally  Neurological: Sensation to 10 g monofilament wire intact 4/5 right and 5/5 left Vibratory sensation reactive bilaterally Ankle reflexes equal and reactive bilaterally   Dermatological: Open skin was noted bilaterally The toenails are hypertrophic, elongated, incurvated, discolored 6-10  Assessment: History of diabetic neuropathy and angiopathy No open skin wounds noted today Mycotic toenails 6-10  Plan: Debridement of toenails 10 mechanical he an electrically without any bleeding  Reappoint 3 months

## 2015-04-15 DIAGNOSIS — E11329 Type 2 diabetes mellitus with mild nonproliferative diabetic retinopathy without macular edema: Secondary | ICD-10-CM | POA: Diagnosis not present

## 2015-04-15 DIAGNOSIS — E11311 Type 2 diabetes mellitus with unspecified diabetic retinopathy with macular edema: Secondary | ICD-10-CM | POA: Diagnosis not present

## 2015-04-22 ENCOUNTER — Other Ambulatory Visit: Payer: Self-pay | Admitting: Endocrinology

## 2015-05-03 ENCOUNTER — Ambulatory Visit (INDEPENDENT_AMBULATORY_CARE_PROVIDER_SITE_OTHER): Payer: Medicare Other | Admitting: Endocrinology

## 2015-05-03 ENCOUNTER — Encounter: Payer: Self-pay | Admitting: Endocrinology

## 2015-05-03 VITALS — BP 124/62 | HR 104 | Temp 98.4°F | Ht 62.0 in | Wt 220.0 lb

## 2015-05-03 DIAGNOSIS — E1065 Type 1 diabetes mellitus with hyperglycemia: Principal | ICD-10-CM

## 2015-05-03 DIAGNOSIS — E1039 Type 1 diabetes mellitus with other diabetic ophthalmic complication: Secondary | ICD-10-CM | POA: Diagnosis not present

## 2015-05-03 DIAGNOSIS — IMO0002 Reserved for concepts with insufficient information to code with codable children: Secondary | ICD-10-CM

## 2015-05-03 LAB — POCT GLYCOSYLATED HEMOGLOBIN (HGB A1C): Hemoglobin A1C: 9.5

## 2015-05-03 MED ORDER — INSULIN REGULAR HUMAN 100 UNIT/ML IJ SOLN: 10.0000 [IU] | Freq: Every day | INTRAMUSCULAR | Status: DC

## 2015-05-03 NOTE — Patient Instructions (Addendum)
check your blood sugar twice a day.  vary the time of day when you check, between before the 3 meals, and at bedtime.  also check if you have symptoms of your blood sugar being too high or too low.  please keep a record of the readings and bring it to your next appointment here.  please call us sooner if your blood sugar goes below 70, or if it stays over 200.  As you write it out, please also write down why it might be higher or lower on a certain day.   Please reduce the NPH to 70 in the morning, and take 10 units of the R with the evening meal.   Please call us next week, to tell us how the blood sugar is doing. On this type of insulin schedule, you should eat meals on a regular schedule.  If a meal is missed or significantly delayed, your blood sugar could go low.  Please come back for a follow-up appointment in 3 months.

## 2015-05-03 NOTE — Progress Notes (Signed)
Subjective:    Patient ID: Gail Lynch, female    DOB: 31-Mar-1941, 74 y.o.   MRN: [id number]  HPI Pt returns for f/u of diabetes mellitus: DM type: Insulin-requiring type 2. Dx'ed: 0100 Complications: polyneuropathy and retinopathy. Therapy: insulin since soon after dx.  GDM: never. DKA: never Severe hypoglycemia: never Pancreatitis: never Other: she has chosen a simple insulin regimen; she takes human insulin, due to cost; she cannot have weight-loss surgery, due to having medicaid. Interval history: She takes NPH, 80 units qam (she does not take the reg--she says it caused palpitations, but not hypoglycemia).  she brings a record of her cbg's which i have reviewed today.  It varies from 66 (afternoon) to 200's (other times of day).  She is splitting the NPH into several injections throughout the day.   Past Medical History  Diagnosis Date  . Hypertension   . Hard of hearing     hear aids - left  . Dysplasia of cervix, low grade (CIN 1)     s/p cervical conization  . HPV (human papilloma virus) infection   . Glaucoma   . Hyperlipidemia   . Colon polyps   . Glaucoma 12/18/2011  . Carotid stenosis 06/18/2012  . PONV (postoperative nausea and vomiting)   . Mycotic toenails 02/19/2013  . SVD (spontaneous vaginal delivery)     x 4  . Asthma     hx as a child - no inhaler, no problems  . Intrinsic asthma, unspecified 12/18/2011  . Diabetes mellitus     Type 2  . Arthritis     gout in feet    Past Surgical History  Procedure Laterality Date  . Knee surgery  2010    patella fracture  . Dilation and curettage of uterus  2000  . Breast biopsy  2010  . Breast biopsy  1991  . Multiple tooth extractions    . Hysteroscopy w/d&c N/A 03/04/2014    Procedure: DILATATION AND CURETTAGE /HYSTEROSCOPY;  Surgeon: Lavonia Drafts, MD;  Location: [facility];  Service: Gynecology;  Laterality: N/A;  . Colonoscopy    . Polypectomy      Social History   Social History  . Marital  Status: Widowed    Spouse Name: N/A  . Number of Children: 3  . Years of Education: N/A   Occupational History  . retired    Social History Main Topics  . Smoking status: Former Smoker    Types: Cigarettes    Quit date: 08/21/1980  . Smokeless tobacco: Former Systems developer  . Alcohol Use: No  . Drug Use: No  . Sexual Activity: Yes    Birth Control/ Protection: Post-menopausal   Other Topics Concern  . Not on file   Social History Narrative    Current Outpatient Prescriptions on File Prior to Visit  Medication Sig Dispense Refill  . acetaminophen (TYLENOL) 500 MG tablet Take 1,000 mg by mouth every 6 (six) hours as needed.    Marland Kitchen allopurinol (ZYLOPRIM) 300 MG tablet Take 0.5 tablets (150 mg total) by mouth daily. 45 tablet 3  . atorvastatin (LIPITOR) 40 MG tablet TAKE 1 TABLET BY MOUTH DAILY 90 tablet 3  . Cholecalciferol (VITAMIN D3) 1000 UNITS CAPS Take 1 capsule by mouth daily.    . fish oil-omega-3 fatty acids 1000 MG capsule Take 2 g by mouth daily.    . Flaxseed, Linseed, (FLAX SEED OIL) 1000 MG CAPS Take 2 capsules by mouth daily.     . Garlic 7121 MG  CAPS Take 1 capsule by mouth daily.    Marland Kitchen glucose blood (ONE TOUCH ULTRA TEST) test strip 1 each by Other route 2 (two) times daily. And lancets 2/day 250.01 180 each 3  . insulin NPH Human (HUMULIN N,NOVOLIN N) 100 UNIT/ML injection Inject 70 Units into the skin daily before breakfast.     . Misc Natural Products (COLON CLEANSER PO) Take by mouth.    . polyethylene glycol (MIRALAX / GLYCOLAX) packet Take 17 g by mouth daily as needed.     . pyridoxine ([employer] VITAMIN B-6) 200 MG tablet Take 200 mg by mouth daily.    . valsartan-hydrochlorothiazide (DIOVAN-HCT) 160-12.5 MG per tablet TAKE 1 TABLET BY MOUTH EVERY DAY 30 tablet 5  . vitamin B-12 (CYANOCOBALAMIN) 500 MCG tablet Take 500 mcg by mouth daily.      No current facility-administered medications on file prior to visit.    Allergies  Allergen Reactions  . Aspirin Other (See  Comments)    BLEEDING BEHIND EYES  . Penicillins     Family History  Problem Relation Age of Onset  . Diabetes Brother   . Heart disease Brother   . Heart disease Mother   . Heart disease Father   . Asthma    . Colon cancer Neg Hx   . Rectal cancer Neg Hx   . Stomach cancer Neg Hx   . Esophageal cancer Neg Hx     BP 124/62 mmHg  Pulse 104  Temp(Src) 98.4 F (36.9 C) (Oral)  Ht 5\' 2"  (1.575 m)  Wt 220 lb (99.791 kg)  BMI 40.23 kg/m2  SpO2 95%  Review of Systems She denies LOC.      Objective:   Physical Exam VITAL SIGNS:  See vs page GENERAL: no distress Pulses: dorsalis pedis intact bilat.   MSK: no deformity of the feet  CV: 1+ bilat leg edema  Skin: no ulcer on the feet. normal color and temp on the feet.  Neuro: sensation is intact to touch on the feet.  Ext: There is bilateral onychomycosis of the toenails.    A1c=9.5%    Assessment & Plan:  DM: ongoing poor control:  Based on the pattern of her cbg's, she needs to retry the regimen I rx'ed at last ov.    Patient is advised the following: Patient Instructions  check your blood sugar twice a day.  vary the time of day when you check, between before the 3 meals, and at bedtime.  also check if you have symptoms of your blood sugar being too high or too low.  please keep a record of the readings and bring it to your next appointment here.  please call us sooner if your blood sugar goes below 70, or if it stays over 200.  As you write it out, please also write down why it might be higher or lower on a certain day.   Please reduce the NPH to 70 in the morning, and take 10 units of the R with the evening meal.   Please call us next week, to tell us how the blood sugar is doing. On this type of insulin schedule, you should eat meals on a regular schedule.  If a meal is missed or significantly delayed, your blood sugar could go low.  Please come back for a follow-up appointment in 3 months.

## 2015-05-06 ENCOUNTER — Encounter: Payer: Self-pay | Admitting: Internal Medicine

## 2015-05-06 ENCOUNTER — Ambulatory Visit (INDEPENDENT_AMBULATORY_CARE_PROVIDER_SITE_OTHER): Payer: Medicare Other | Admitting: Internal Medicine

## 2015-05-06 VITALS — BP 130/70 | HR 87 | Wt 220.0 lb

## 2015-05-06 DIAGNOSIS — IMO0002 Reserved for concepts with insufficient information to code with codable children: Secondary | ICD-10-CM

## 2015-05-06 DIAGNOSIS — E1039 Type 1 diabetes mellitus with other diabetic ophthalmic complication: Secondary | ICD-10-CM | POA: Diagnosis not present

## 2015-05-06 DIAGNOSIS — I10 Essential (primary) hypertension: Secondary | ICD-10-CM | POA: Diagnosis not present

## 2015-05-06 DIAGNOSIS — E1065 Type 1 diabetes mellitus with hyperglycemia: Secondary | ICD-10-CM

## 2015-05-06 DIAGNOSIS — Z0000 Encounter for general adult medical examination without abnormal findings: Secondary | ICD-10-CM

## 2015-05-06 DIAGNOSIS — E785 Hyperlipidemia, unspecified: Secondary | ICD-10-CM | POA: Diagnosis not present

## 2015-05-06 MED ORDER — ALLOPURINOL 300 MG PO TABS: 150.0000 mg | ORAL_TABLET | Freq: Every day | ORAL | Status: DC

## 2015-05-06 NOTE — Assessment & Plan Note (Signed)
stable overall by history and exam, recent data reviewed with pt, and pt to continue medical treatment as before,  to f/u any worsening symptoms or concerns BP Readings from Last 3 Encounters:  05/06/15 130/70  05/03/15 124/62  01/29/15 122/60

## 2015-05-06 NOTE — Patient Instructions (Signed)
Please continue all other medications as before, and refills have been done if requested.  Please have the pharmacy call with any other refills you may need.  Please continue your efforts at being more active, low cholesterol diet, and weight control.  You are otherwise up to date with prevention measures today.  Please keep your appointments with your specialists as you may have planned  Please return in 6 months, or sooner if needed, with Lab testing done 3-5 days before  

## 2015-05-06 NOTE — Assessment & Plan Note (Signed)
stable overall by history and exam, recent data reviewed with pt, and pt to continue medical treatment as before,  to f/u any worsening symptoms or concerns Lab Results  Component Value Date   LDLCALC 71 11/03/2014    

## 2015-05-06 NOTE — Progress Notes (Signed)
Subjective:    Patient ID: Gail Lynch, female    DOB: 1941/03/07, 74 y.o.   MRN: [id number]  HPI  Here to f/u; overall doing ok,  Pt denies chest pain, increasing sob or doe, wheezing, orthopnea, PND, increased LE swelling, palpitations, dizziness or syncope.  Pt denies new neurological symptoms such as new headache, or facial or extremity weakness or numbness.  Pt denies polydipsia, polyuria, or low sugar episode.   Pt denies new neurological symptoms such as new headache, or facial or extremity weakness or numbness.   Pt states overall good compliance with meds, mostly trying to follow appropriate diet, with wt overall stable,  but little exercise however. No new complaints Past Medical History  Diagnosis Date  . Hypertension   . Hard of hearing     hear aids - left  . Dysplasia of cervix, low grade (CIN 1)     s/p cervical conization  . HPV (human papilloma virus) infection   . Glaucoma   . Hyperlipidemia   . Colon polyps   . Glaucoma 12/18/2011  . Carotid stenosis 06/18/2012  . PONV (postoperative nausea and vomiting)   . Mycotic toenails 02/19/2013  . SVD (spontaneous vaginal delivery)     x 4  . Asthma     hx as a child - no inhaler, no problems  . Intrinsic asthma, unspecified 12/18/2011  . Diabetes mellitus     Type 2  . Arthritis     gout in feet   Past Surgical History  Procedure Laterality Date  . Knee surgery  2010    patella fracture  . Dilation and curettage of uterus  2000  . Breast biopsy  2010  . Breast biopsy  1991  . Multiple tooth extractions    . Hysteroscopy w/d&c N/A 03/04/2014    Procedure: DILATATION AND CURETTAGE /HYSTEROSCOPY;  Surgeon: Lavonia Drafts, MD;  Location: [facility];  Service: Gynecology;  Laterality: N/A;  . Colonoscopy    . Polypectomy      reports that she quit smoking about 34 years ago. Her smoking use included Cigarettes. She has quit using smokeless tobacco. She reports that she does not drink alcohol or use illicit  drugs. family history includes Asthma in an other family member; Diabetes in her brother; Heart disease in her brother, father, and mother. There is no history of Colon cancer, Rectal cancer, Stomach cancer, or Esophageal cancer. Allergies  Allergen Reactions  . Aspirin Other (See Comments)    BLEEDING BEHIND EYES  . Penicillins    Current Outpatient Prescriptions on File Prior to Visit  Medication Sig Dispense Refill  . acetaminophen (TYLENOL) 500 MG tablet Take 1,000 mg by mouth every 6 (six) hours as needed.    Marland Kitchen atorvastatin (LIPITOR) 40 MG tablet TAKE 1 TABLET BY MOUTH DAILY 90 tablet 3  . Cholecalciferol (VITAMIN D3) 1000 UNITS CAPS Take 1 capsule by mouth daily.    . fish oil-omega-3 fatty acids 1000 MG capsule Take 2 g by mouth daily.    . Flaxseed, Linseed, (FLAX SEED OIL) 1000 MG CAPS Take 2 capsules by mouth daily.     . Garlic 7062 MG CAPS Take 1 capsule by mouth daily.    Marland Kitchen glucose blood (ONE TOUCH ULTRA TEST) test strip 1 each by Other route 2 (two) times daily. And lancets 2/day 250.01 180 each 3  . insulin NPH Human (HUMULIN N,NOVOLIN N) 100 UNIT/ML injection Inject 70 Units into the skin daily before breakfast.     .  insulin regular (HUMULIN R) 100 units/mL injection Inject 0.1 mLs (10 Units total) into the skin daily with supper. 10 mL 11  . Misc Natural Products (COLON CLEANSER PO) Take by mouth.    . polyethylene glycol (MIRALAX / GLYCOLAX) packet Take 17 g by mouth daily as needed.     . pyridoxine (CVS VITAMIN B-6) 200 MG tablet Take 200 mg by mouth daily.    . valsartan-hydrochlorothiazide (DIOVAN-HCT) 160-12.5 MG per tablet TAKE 1 TABLET BY MOUTH EVERY DAY 30 tablet 5  . vitamin B-12 (CYANOCOBALAMIN) 500 MCG tablet Take 500 mcg by mouth daily.      No current facility-administered medications on file prior to visit.    Review of Systems  Constitutional: Negative for unusual diaphoresis or night sweats HENT: Negative for ringing in ear or discharge Eyes: Negative  for double vision or worsening visual disturbance.  Respiratory: Negative for choking and stridor.   Gastrointestinal: Negative for vomiting or other signifcant bowel change Genitourinary: Negative for hematuria or change in urine volume.  Musculoskeletal: Negative for other MSK pain or swelling Skin: Negative for color change and worsening wound.  Neurological: Negative for tremors and numbness other than noted  Psychiatric/Behavioral: Negative for decreased concentration or agitation other than above       Objective:   Physical Exam BP 130/70 mmHg  Pulse 87  Wt 220 lb (99.791 kg)  SpO2 98% N/A  VS noted,  Constitutional: Pt appears in no significant distress HENT: Head: NCAT.  Right Ear: External ear normal.  Left Ear: External ear normal.  Eyes: . Pupils are equal, round, and reactive to light. Conjunctivae and EOM are normal Neck: Normal range of motion. Neck supple.  Cardiovascular: Normal rate and regular rhythm.   Pulmonary/Chest: Effort normal and breath sounds without rales or wheezing.  Abd:  Soft, NT, ND, + BS Neurological: Pt is alert. Not confused , motor grossly intact Skin: Skin is warm. No rash, no LE edema Psychiatric: Pt behavior is normal. No agitation.     Assessment & Plan:

## 2015-05-06 NOTE — Assessment & Plan Note (Signed)
stable overall by history and exam but needs further improvement, recent data reviewed with pt, and pt to continue medical treatment as before,  to f/u any worsening symptoms or concerns Lab Results  Component Value Date   HGBA1C 9.5 05/03/2015  Needs close f/u with endo - to continue with Dr Loanne Drilling

## 2015-05-06 NOTE — Progress Notes (Signed)
Pre visit review using our clinic review tool, if applicable. No additional management support is needed unless otherwise documented below in the visit note. 

## 2015-05-10 DIAGNOSIS — E109 Type 1 diabetes mellitus without complications: Secondary | ICD-10-CM | POA: Diagnosis not present

## 2015-05-25 ENCOUNTER — Other Ambulatory Visit: Payer: Self-pay | Admitting: Endocrinology

## 2015-06-27 ENCOUNTER — Other Ambulatory Visit: Payer: Self-pay | Admitting: Endocrinology

## 2015-07-13 ENCOUNTER — Ambulatory Visit (INDEPENDENT_AMBULATORY_CARE_PROVIDER_SITE_OTHER): Payer: Medicare Other | Admitting: Podiatry

## 2015-07-13 ENCOUNTER — Encounter: Payer: Self-pay | Admitting: Podiatry

## 2015-07-13 ENCOUNTER — Ambulatory Visit: Payer: Medicaid Other | Admitting: Podiatry

## 2015-07-13 DIAGNOSIS — B351 Tinea unguium: Secondary | ICD-10-CM

## 2015-07-13 DIAGNOSIS — M79676 Pain in unspecified toe(s): Secondary | ICD-10-CM

## 2015-07-13 DIAGNOSIS — E1151 Type 2 diabetes mellitus with diabetic peripheral angiopathy without gangrene: Secondary | ICD-10-CM

## 2015-07-13 NOTE — Progress Notes (Signed)
Patient ID: Gail Lynch, female   DOB: [birth year], 74 y.o.   MRN: [id number] Complaint:  Visit Type: Patient returns to my office for continued preventative foot care services. Complaint: Patient states" my nails have grown long and thick and become painful to walk and wear shoes" Patient has been diagnosed with DM with neuropathy and angiopathy.. The patient presents for preventative foot care services. No changes to ROS  Podiatric Exam: Vascular: dorsalis pedis and posterior tibial pulses are not  palpable bilateral. Capillary return is immediate. Temperature gradient is WNL. Skin turgor WNL  Sensorium: Normal Semmes Weinstein monofilament test. Normal tactile sensation bilaterally. Nail Exam: Pt has thick disfigured discolored nails with subungual debris noted bilateral entire nail hallux through fifth toenails Ulcer Exam: There is no evidence of ulcer or pre-ulcerative changes or infection. Orthopedic Exam: Muscle tone and strength are WNL. No limitations in general ROM. No crepitus or effusions noted. Foot type and digits show no abnormalities. Bony prominences are unremarkable. Skin: No Porokeratosis. No infection or ulcers  Diagnosis:  Onychomycosis, , Pain in right toe, pain in left toes  Treatment & Plan Procedures and Treatment: Consent by patient was obtained for treatment procedures. The patient understood the discussion of treatment and procedures well. All questions were answered thoroughly reviewed. Debridement of mycotic and hypertrophic toenails, 1 through 5 bilateral and clearing of subungual debris. No ulceration, no infection noted.  Return Visit-Office Procedure: Patient instructed to return to the office for a follow up visit 3 months for continued evaluation and treatment.    Gardiner Barefoot DPM

## 2015-07-20 DIAGNOSIS — Z961 Presence of intraocular lens: Secondary | ICD-10-CM | POA: Diagnosis not present

## 2015-07-20 DIAGNOSIS — H35033 Hypertensive retinopathy, bilateral: Secondary | ICD-10-CM | POA: Diagnosis not present

## 2015-07-20 DIAGNOSIS — E113312 Type 2 diabetes mellitus with moderate nonproliferative diabetic retinopathy with macular edema, left eye: Secondary | ICD-10-CM | POA: Diagnosis not present

## 2015-07-20 DIAGNOSIS — E11311 Type 2 diabetes mellitus with unspecified diabetic retinopathy with macular edema: Secondary | ICD-10-CM | POA: Diagnosis not present

## 2015-07-25 ENCOUNTER — Other Ambulatory Visit: Payer: Self-pay | Admitting: Endocrinology

## 2015-08-02 ENCOUNTER — Ambulatory Visit (INDEPENDENT_AMBULATORY_CARE_PROVIDER_SITE_OTHER): Payer: Medicare Other | Admitting: Endocrinology

## 2015-08-02 ENCOUNTER — Encounter: Payer: Self-pay | Admitting: Endocrinology

## 2015-08-02 VITALS — BP 138/64 | HR 97 | Temp 98.2°F | Ht 62.0 in | Wt 223.0 lb

## 2015-08-02 DIAGNOSIS — IMO0002 Reserved for concepts with insufficient information to code with codable children: Secondary | ICD-10-CM

## 2015-08-02 DIAGNOSIS — E1039 Type 1 diabetes mellitus with other diabetic ophthalmic complication: Secondary | ICD-10-CM

## 2015-08-02 DIAGNOSIS — E1065 Type 1 diabetes mellitus with hyperglycemia: Principal | ICD-10-CM

## 2015-08-02 LAB — POCT GLYCOSYLATED HEMOGLOBIN (HGB A1C): HEMOGLOBIN A1C: 9.3

## 2015-08-02 MED ORDER — INSULIN GLARGINE 100 UNIT/ML ~~LOC~~ SOLN: 90.0000 [IU] | SUBCUTANEOUS | Status: DC

## 2015-08-02 NOTE — Progress Notes (Signed)
Subjective:    Patient ID: Gail Lynch, female    DOB: 02/08/1941, 74 y.o.   MRN: [id number]  HPI Pt returns for f/u of diabetes mellitus: DM type: Insulin-requiring type 2. Dx'ed: 0000000 Complications: polyneuropathy and retinopathy. Therapy: insulin since soon after dx.  GDM: never. DKA: never Severe hypoglycemia: never Pancreatitis: never Other: she has chosen a simple insulin regimen; she takes human insulin, due to cost; she cannot have weight-loss surgery, due to having medicaid.  Interval history: She takes a widely varying dosage of both insulins, and sometimes takes none.  She does not know if she has medicaid.  no cbg record, but states cbg's vary from 60-300. Past Medical History  Diagnosis Date  . Hypertension   . Hard of hearing     hear aids - left  . Dysplasia of cervix, low grade (CIN 1)     s/p cervical conization  . HPV (human papilloma virus) infection   . Glaucoma   . Hyperlipidemia   . Colon polyps   . Glaucoma 12/18/2011  . Carotid stenosis 06/18/2012  . PONV (postoperative nausea and vomiting)   . Mycotic toenails 02/19/2013  . SVD (spontaneous vaginal delivery)     x 4  . Asthma     hx as a child - no inhaler, no problems  . Intrinsic asthma, unspecified 12/18/2011  . Diabetes mellitus     Type 2  . Arthritis     gout in feet    Past Surgical History  Procedure Laterality Date  . Knee surgery  2010    patella fracture  . Dilation and curettage of uterus  2000  . Breast biopsy  2010  . Breast biopsy  1991  . Multiple tooth extractions    . Hysteroscopy w/d&c N/A 03/04/2014    Procedure: DILATATION AND CURETTAGE /HYSTEROSCOPY;  Surgeon: Lavonia Drafts, MD;  Location: [facility];  Service: Gynecology;  Laterality: N/A;  . Colonoscopy    . Polypectomy      Social History   Social History  . Marital Status: Widowed    Spouse Name: N/A  . Number of Children: 3  . Years of Education: N/A   Occupational History  . retired    Social  History Main Topics  . Smoking status: Former Smoker    Types: Cigarettes    Quit date: 08/21/1980  . Smokeless tobacco: Former Systems developer  . Alcohol Use: No  . Drug Use: No  . Sexual Activity: Yes    Birth Control/ Protection: Post-menopausal   Other Topics Concern  . Not on file   Social History Narrative    Current Outpatient Prescriptions on File Prior to Visit  Medication Sig Dispense Refill  . acetaminophen (TYLENOL) 500 MG tablet Take 1,000 mg by mouth every 6 (six) hours as needed.    Marland Kitchen allopurinol (ZYLOPRIM) 300 MG tablet Take 0.5 tablets (150 mg total) by mouth daily. 45 tablet 3  . atorvastatin (LIPITOR) 40 MG tablet TAKE 1 TABLET BY MOUTH DAILY 90 tablet 3  . Cholecalciferol (VITAMIN D3) 1000 UNITS CAPS Take 1 capsule by mouth daily.    . fish oil-omega-3 fatty acids 1000 MG capsule Take 2 g by mouth daily.    . Flaxseed, Linseed, (FLAX SEED OIL) 1000 MG CAPS Take 2 capsules by mouth daily.     . Garlic 123XX123 MG CAPS Take 1 capsule by mouth daily.    Marland Kitchen glucose blood (ONE TOUCH ULTRA TEST) test strip 1 each by Other route 2 (  two) times daily. And lancets 2/day 250.01 180 each 3  . Misc Natural Products (COLON CLEANSER PO) Take by mouth.    . polyethylene glycol (MIRALAX / GLYCOLAX) packet Take 17 g by mouth daily as needed.     . pyridoxine (CVS VITAMIN B-6) 200 MG tablet Take 200 mg by mouth daily.    . valsartan-hydrochlorothiazide (DIOVAN-HCT) 160-12.5 MG per tablet TAKE 1 TABLET BY MOUTH EVERY DAY 30 tablet 5  . vitamin B-12 (CYANOCOBALAMIN) 500 MCG tablet Take 500 mcg by mouth daily.      No current facility-administered medications on file prior to visit.    Allergies  Allergen Reactions  . Aspirin Other (See Comments)    BLEEDING BEHIND EYES  . Penicillins     Family History  Problem Relation Age of Onset  . Diabetes Brother   . Heart disease Brother   . Heart disease Mother   . Heart disease Father   . Asthma    . Colon cancer Neg Hx   . Rectal cancer Neg  Hx   . Stomach cancer Neg Hx   . Esophageal cancer Neg Hx     BP 138/64 mmHg  Pulse 97  Temp(Src) 98.2 F (36.8 C) (Oral)  Ht 5\' 2"  (1.575 m)  Wt 223 lb (101.152 kg)  BMI 40.78 kg/m2  SpO2 98%  Review of Systems Denies LOC    Objective:   Physical Exam VITAL SIGNS:  See vs page.  GENERAL: no distress. Pulses: dorsalis pedis intact bilat.   MSK: no deformity of the feet.  CV: 1+ bilat leg edema.   Skin:  no ulcer on the feet.  normal color and temp on the feet. Neuro: sensation is intact to touch on the feet.     A1c=9.8%    Assessment & Plan:  DM: ongoing poor glycemic control.  She should try a simpler qd insulin regimen.  Patient is advised the following: Patient Instructions  check your blood sugar twice a day.  vary the time of day when you check, between before the 3 meals, and at bedtime.  also check if you have symptoms of your blood sugar being too high or too low.  please keep a record of the readings and bring it to your next appointment here.  please call us sooner if your blood sugar goes below 70, or if it stays over 200.  As you write it out, please also write down why it might be higher or lower on a certain day.   Please change both insulins to lantus, 90 units each morning.   Please call us next week, to tell us how the blood sugar is doing.   On this type of insulin schedule, you should eat meals on a regular schedule.  If a meal is missed or significantly delayed, your blood sugar could go low.  Please come back for a follow-up appointment in 2 months.

## 2015-08-02 NOTE — Patient Instructions (Addendum)
check your blood sugar twice a day.  vary the time of day when you check, between before the 3 meals, and at bedtime.  also check if you have symptoms of your blood sugar being too high or too low.  please keep a record of the readings and bring it to your next appointment here.  please call us sooner if your blood sugar goes below 70, or if it stays over 200.  As you write it out, please also write down why it might be higher or lower on a certain day.   Please change both insulins to lantus, 90 units each morning.   Please call us next week, to tell us how the blood sugar is doing.   On this type of insulin schedule, you should eat meals on a regular schedule.  If a meal is missed or significantly delayed, your blood sugar could go low.  Please come back for a follow-up appointment in 2 months.

## 2015-08-10 DIAGNOSIS — E109 Type 1 diabetes mellitus without complications: Secondary | ICD-10-CM | POA: Diagnosis not present

## 2015-08-23 ENCOUNTER — Other Ambulatory Visit: Payer: Self-pay | Admitting: Endocrinology

## 2015-08-24 NOTE — Telephone Encounter (Signed)
Please advise if ok to refill. Thanks 

## 2015-08-30 ENCOUNTER — Other Ambulatory Visit: Payer: Self-pay | Admitting: Internal Medicine

## 2015-10-04 ENCOUNTER — Ambulatory Visit (INDEPENDENT_AMBULATORY_CARE_PROVIDER_SITE_OTHER): Payer: Medicare Other | Admitting: Endocrinology

## 2015-10-04 ENCOUNTER — Encounter: Payer: Self-pay | Admitting: Endocrinology

## 2015-10-04 VITALS — BP 120/62 | HR 93 | Temp 98.2°F | Resp 12 | Ht 62.0 in | Wt 225.0 lb

## 2015-10-04 DIAGNOSIS — E1039 Type 1 diabetes mellitus with other diabetic ophthalmic complication: Secondary | ICD-10-CM | POA: Diagnosis not present

## 2015-10-04 DIAGNOSIS — E1065 Type 1 diabetes mellitus with hyperglycemia: Principal | ICD-10-CM

## 2015-10-04 DIAGNOSIS — IMO0002 Reserved for concepts with insufficient information to code with codable children: Secondary | ICD-10-CM

## 2015-10-04 LAB — POCT GLYCOSYLATED HEMOGLOBIN (HGB A1C): HEMOGLOBIN A1C: 8.5

## 2015-10-04 NOTE — Patient Instructions (Addendum)
check your blood sugar twice a day.  vary the time of day when you check, between before the 3 meals, and at bedtime.  also check if you have symptoms of your blood sugar being too high or too low.  please keep a record of the readings and bring it to your next appointment here.  please call us sooner if your blood sugar goes below 70, or if it stays over 200.  As you write it out, please also write down why it might be higher or lower on a certain day.   Please continue the same insulin for now.  On this type of insulin schedule, you should eat meals on a regular schedule.  If a meal is missed or significantly delayed, your blood sugar could go low.  Please come back for a follow-up appointment in 3 months.       

## 2015-10-04 NOTE — Progress Notes (Signed)
Subjective:    Patient ID: Gail Lynch, female    DOB: 06-Feb-1941, 75 y.o.   MRN: [id number]  HPI Pt returns for f/u of diabetes mellitus: DM type: Insulin-requiring type 2. Dx'ed: 0000000 Complications: polyneuropathy and retinopathy. Therapy: insulin since soon after dx.  GDM: never. DKA: never Severe hypoglycemia: never Pancreatitis: never Other: she has chosen a simple insulin regimen; she takes human insulin, due to cost; she cannot have weight-loss surgery, due to having medicaid.  Interval history: she brings a record of her cbg's which i have reviewed today.  It varies from 79-200's.  It is in general highest at hs.  pt states she feels well in general, except for weight gain. Past Medical History  Diagnosis Date  . Hypertension   . Hard of hearing     hear aids - left  . Dysplasia of cervix, low grade (CIN 1)     s/p cervical conization  . HPV (human papilloma virus) infection   . Glaucoma   . Hyperlipidemia   . Colon polyps   . Glaucoma 12/18/2011  . Carotid stenosis 06/18/2012  . PONV (postoperative nausea and vomiting)   . Mycotic toenails 02/19/2013  . SVD (spontaneous vaginal delivery)     x 4  . Asthma     hx as a child - no inhaler, no problems  . Intrinsic asthma, unspecified 12/18/2011  . Diabetes mellitus     Type 2  . Arthritis     gout in feet    Past Surgical History  Procedure Laterality Date  . Knee surgery  2010    patella fracture  . Dilation and curettage of uterus  2000  . Breast biopsy  2010  . Breast biopsy  1991  . Multiple tooth extractions    . Hysteroscopy w/d&c N/A 03/04/2014    Procedure: DILATATION AND CURETTAGE /HYSTEROSCOPY;  Surgeon: Lavonia Drafts, MD;  Location: [facility];  Service: Gynecology;  Laterality: N/A;  . Colonoscopy    . Polypectomy      Social History   Social History  . Marital Status: Widowed    Spouse Name: N/A  . Number of Children: 3  . Years of Education: N/A   Occupational History  . retired     Social History Main Topics  . Smoking status: Former Smoker    Types: Cigarettes    Quit date: 08/21/1980  . Smokeless tobacco: Former Systems developer  . Alcohol Use: No  . Drug Use: No  . Sexual Activity: Yes    Birth Control/ Protection: Post-menopausal   Other Topics Concern  . Not on file   Social History Narrative    Current Outpatient Prescriptions on File Prior to Visit  Medication Sig Dispense Refill  . acetaminophen (TYLENOL) 500 MG tablet Take 1,000 mg by mouth every 6 (six) hours as needed.    Marland Kitchen allopurinol (ZYLOPRIM) 300 MG tablet Take 0.5 tablets (150 mg total) by mouth daily. 45 tablet 3  . atorvastatin (LIPITOR) 40 MG tablet TAKE 1 TABLET BY MOUTH DAILY 90 tablet 3  . Cholecalciferol (VITAMIN D3) 1000 UNITS CAPS Take 1 capsule by mouth daily.    . fish oil-omega-3 fatty acids 1000 MG capsule Take 2 g by mouth daily.    . Flaxseed, Linseed, (FLAX SEED OIL) 1000 MG CAPS Take 2 capsules by mouth daily.     . Garlic 123XX123 MG CAPS Take 1 capsule by mouth daily.    Marland Kitchen glucose blood (ONE TOUCH ULTRA TEST) test strip 1  each by Other route 2 (two) times daily. And lancets 2/day 250.01 180 each 3  . insulin glargine (LANTUS) 100 UNIT/ML injection Inject 0.9 mLs (90 Units total) into the skin every morning. And syringes 1/day 30 mL 11  . Misc Natural Products (COLON CLEANSER PO) Take by mouth.    . polyethylene glycol (MIRALAX / GLYCOLAX) packet Take 17 g by mouth daily as needed.     . pyridoxine (CVS VITAMIN B-6) 200 MG tablet Take 200 mg by mouth daily.    . valsartan-hydrochlorothiazide (DIOVAN-HCT) 160-12.5 MG tablet TAKE 1 TABLET BY MOUTH EVERY DAY 30 tablet 3  . vitamin B-12 (CYANOCOBALAMIN) 500 MCG tablet Take 500 mcg by mouth daily.      No current facility-administered medications on file prior to visit.    Allergies  Allergen Reactions  . Aspirin Other (See Comments)    BLEEDING BEHIND EYES  . Penicillins     Family History  Problem Relation Age of Onset  .  Diabetes Brother   . Heart disease Brother   . Heart disease Mother   . Heart disease Father   . Asthma    . Colon cancer Neg Hx   . Rectal cancer Neg Hx   . Stomach cancer Neg Hx   . Esophageal cancer Neg Hx     BP 120/62 mmHg  Pulse 93  Temp(Src) 98.2 F (36.8 C) (Oral)  Resp 12  Ht 5\' 2"  (1.575 m)  Wt 225 lb (102.059 kg)  BMI 41.14 kg/m2  SpO2 94%  Review of Systems She denies hypoglycemia    Objective:   Physical Exam VITAL SIGNS:  See vs page GENERAL: no distress SKIN:  Insulin injection sites at the anterior abdomen are normal.     A1c=8.5%    Assessment & Plan:  DM: she needs increased rx.  I advised he to to add humalog with supper, but she declines.    Patient is advised the following: Patient Instructions  check your blood sugar twice a day.  vary the time of day when you check, between before the 3 meals, and at bedtime.  also check if you have symptoms of your blood sugar being too high or too low.  please keep a record of the readings and bring it to your next appointment here.  please call us sooner if your blood sugar goes below 70, or if it stays over 200.  As you write it out, please also write down why it might be higher or lower on a certain day.   Please continue the same insulin for now.  On this type of insulin schedule, you should eat meals on a regular schedule.  If a meal is missed or significantly delayed, your blood sugar could go low.  Please come back for a follow-up appointment in 3 months.

## 2015-10-13 ENCOUNTER — Telehealth: Payer: Self-pay | Admitting: Endocrinology

## 2015-10-13 NOTE — Telephone Encounter (Signed)
Grace case manger for [facility] would like to know patient A1C, B/P, next appt.  [phone] ex [phone]

## 2015-10-15 ENCOUNTER — Ambulatory Visit (INDEPENDENT_AMBULATORY_CARE_PROVIDER_SITE_OTHER): Payer: Medicare Other | Admitting: Podiatry

## 2015-10-15 DIAGNOSIS — E1151 Type 2 diabetes mellitus with diabetic peripheral angiopathy without gangrene: Secondary | ICD-10-CM

## 2015-10-15 DIAGNOSIS — B351 Tinea unguium: Secondary | ICD-10-CM | POA: Diagnosis not present

## 2015-10-15 DIAGNOSIS — M79676 Pain in unspecified toe(s): Secondary | ICD-10-CM

## 2015-10-15 MED ORDER — HYDROCODONE-ACETAMINOPHEN 10-325 MG PO TABS: 1.0000 | ORAL_TABLET | Freq: Four times a day (QID) | ORAL | Status: DC | PRN

## 2015-10-15 MED ORDER — AMMONIUM LACTATE 12 % EX CREA: TOPICAL_CREAM | CUTANEOUS | Status: DC | PRN

## 2015-10-15 NOTE — Progress Notes (Signed)
Patient ID: Gail Lynch, female   DOB: 09/21/1940, 75 y.o.   MRN: [id number] Complaint:  Visit Type: Patient returns to my office for continued preventative foot care services. Complaint: Patient states" my nails have grown long and thick and become painful to walk and wear shoes" Patient has been diagnosed with DM with neuropathy and angiopathy.. The patient presents for preventative foot care services. No changes to ROS  Podiatric Exam: Vascular: dorsalis pedis and posterior tibial pulses are not  palpable bilateral. Capillary return is immediate. Temperature gradient is WNL. Skin turgor WNL  Sensorium: Normal Semmes Weinstein monofilament test. Normal tactile sensation bilaterally. Nail Exam: Pt has thick disfigured discolored nails with subungual debris noted bilateral entire nail hallux through fifth toenails Ulcer Exam: There is no evidence of ulcer or pre-ulcerative changes or infection. Orthopedic Exam: Muscle tone and strength are WNL. No limitations in general ROM. No crepitus or effusions noted. Foot type and digits show no abnormalities. Bony prominences are unremarkable. Skin: No Porokeratosis. No infection or ulcers  Diagnosis:  Onychomycosis, , Pain in right toe, pain in left toes  Treatment & Plan Procedures and Treatment: Consent by patient was obtained for treatment procedures. The patient understood the discussion of treatment and procedures well. All questions were answered thoroughly reviewed. Debridement of mycotic and hypertrophic toenails, 1 through 5 bilateral and clearing of subungual debris. No ulceration, no infection noted.  Return Visit-Office Procedure: Patient instructed to return to the office for a follow up visit 3 months for continued evaluation and treatment.    Gardiner Barefoot DPM

## 2015-10-18 NOTE — Telephone Encounter (Signed)
Left a voicemail for Gail Lynch advising of BP, A1C and next appointment. Requested a call back if she would like to discuss further.

## 2015-10-28 ENCOUNTER — Other Ambulatory Visit (INDEPENDENT_AMBULATORY_CARE_PROVIDER_SITE_OTHER): Payer: Medicare Other

## 2015-10-28 DIAGNOSIS — IMO0002 Reserved for concepts with insufficient information to code with codable children: Secondary | ICD-10-CM

## 2015-10-28 DIAGNOSIS — Z0189 Encounter for other specified special examinations: Secondary | ICD-10-CM

## 2015-10-28 DIAGNOSIS — E1039 Type 1 diabetes mellitus with other diabetic ophthalmic complication: Secondary | ICD-10-CM | POA: Diagnosis not present

## 2015-10-28 DIAGNOSIS — E1065 Type 1 diabetes mellitus with hyperglycemia: Principal | ICD-10-CM

## 2015-10-28 DIAGNOSIS — Z0000 Encounter for general adult medical examination without abnormal findings: Secondary | ICD-10-CM

## 2015-10-28 LAB — BASIC METABOLIC PANEL
BUN: 25 mg/dL — AB (ref 6–23)
CHLORIDE: 109 meq/L (ref 96–112)
CO2: 26 meq/L (ref 19–32)
Calcium: 9.1 mg/dL (ref 8.4–10.5)
Creatinine, Ser: 1.38 mg/dL — ABNORMAL HIGH (ref 0.40–1.20)
GFR: 47.7 mL/min — ABNORMAL LOW (ref >60.00)
Glucose, Bld: 157 mg/dL — ABNORMAL HIGH (ref 70–99)
POTASSIUM: 4.2 meq/L (ref 3.5–5.1)
Sodium: 143 mEq/L (ref 135–145)

## 2015-10-28 LAB — LIPID PANEL
CHOL/HDL RATIO: 4
Cholesterol: 156 mg/dL (ref 0–200)
HDL: 39.7 mg/dL (ref >39.00)
LDL Cholesterol: 93 mg/dL (ref 0–99)
NonHDL: 116.78
TRIGLYCERIDES: 117 mg/dL (ref 0.0–149.0)
VLDL: 23.4 mg/dL (ref 0.0–40.0)

## 2015-10-28 LAB — TSH: TSH: 2.01 u[IU]/mL (ref 0.35–4.50)

## 2015-10-28 LAB — HEPATIC FUNCTION PANEL
ALT: 24 U/L (ref 0–35)
AST: 22 U/L (ref 0–37)
Albumin: 3.7 g/dL (ref 3.5–5.2)
Alkaline Phosphatase: 95 U/L (ref 39–117)
BILIRUBIN DIRECT: 0 mg/dL (ref 0.0–0.3)
BILIRUBIN TOTAL: 0.2 mg/dL (ref 0.2–1.2)
TOTAL PROTEIN: 7 g/dL (ref 6.0–8.3)

## 2015-10-28 LAB — CBC WITH DIFFERENTIAL/PLATELET
BASOS PCT: 0.3 % (ref 0.0–3.0)
Basophils Absolute: 0 10*3/uL (ref 0.0–0.1)
EOS ABS: 0.2 10*3/uL (ref 0.0–0.7)
Eosinophils Relative: 2.2 % (ref 0.0–5.0)
HCT: 35.5 % — ABNORMAL LOW (ref 36.0–46.0)
Hemoglobin: 11.7 g/dL — ABNORMAL LOW (ref 12.0–15.0)
Lymphocytes Relative: 31.7 % (ref 12.0–46.0)
Lymphs Abs: 2.5 10*3/uL (ref 0.7–4.0)
MCHC: 32.9 g/dL (ref 30.0–36.0)
MCV: 85.3 fl (ref 78.0–100.0)
MONO ABS: 0.5 10*3/uL (ref 0.1–1.0)
Monocytes Relative: 6.2 % (ref 3.0–12.0)
NEUTROS ABS: 4.6 10*3/uL (ref 1.4–7.7)
Neutrophils Relative %: 59.6 % (ref 43.0–77.0)
Platelets: 309 10*3/uL (ref 150.0–400.0)
RBC: 4.16 Mil/uL (ref 3.87–5.11)
RDW: 14.5 % (ref 11.5–15.5)
WBC: 7.8 10*3/uL (ref 4.0–10.5)

## 2015-10-28 LAB — HEMOGLOBIN A1C: Hgb A1c MFr Bld: 9.2 % — ABNORMAL HIGH (ref 4.6–6.5)

## 2015-11-03 ENCOUNTER — Ambulatory Visit: Payer: Medicare Other | Admitting: Internal Medicine

## 2015-11-08 DIAGNOSIS — E109 Type 1 diabetes mellitus without complications: Secondary | ICD-10-CM | POA: Diagnosis not present

## 2015-11-17 ENCOUNTER — Encounter: Payer: Self-pay | Admitting: Internal Medicine

## 2015-11-17 ENCOUNTER — Ambulatory Visit (INDEPENDENT_AMBULATORY_CARE_PROVIDER_SITE_OTHER): Payer: Medicare Other | Admitting: Internal Medicine

## 2015-11-17 VITALS — BP 120/60 | HR 82 | Temp 97.8°F | Resp 20 | Wt 223.0 lb

## 2015-11-17 DIAGNOSIS — I10 Essential (primary) hypertension: Secondary | ICD-10-CM

## 2015-11-17 DIAGNOSIS — E785 Hyperlipidemia, unspecified: Secondary | ICD-10-CM | POA: Diagnosis not present

## 2015-11-17 DIAGNOSIS — E1039 Type 1 diabetes mellitus with other diabetic ophthalmic complication: Secondary | ICD-10-CM | POA: Diagnosis not present

## 2015-11-17 DIAGNOSIS — E1065 Type 1 diabetes mellitus with hyperglycemia: Secondary | ICD-10-CM

## 2015-11-17 DIAGNOSIS — Z0000 Encounter for general adult medical examination without abnormal findings: Secondary | ICD-10-CM

## 2015-11-17 DIAGNOSIS — IMO0002 Reserved for concepts with insufficient information to code with codable children: Secondary | ICD-10-CM

## 2015-11-17 NOTE — Assessment & Plan Note (Signed)

## 2015-11-17 NOTE — Progress Notes (Signed)
Pre visit review using our clinic review tool, if applicable. No additional management support is needed unless otherwise documented below in the visit note. 

## 2015-11-17 NOTE — Patient Instructions (Signed)
Please continue all other medications as before, and refills have been done if requested.  Please have the pharmacy call with any other refills you may need.  Please continue your efforts at being more active, low cholesterol diet, and weight control.  You are otherwise up to date with prevention measures today.  Please keep your appointments with your specialists as you may have planned  Please return in 6 months, or sooner if needed, with Lab testing done 3-5 days before  

## 2015-11-17 NOTE — Assessment & Plan Note (Addendum)
stable overall by history and exam, recent data reviewed with pt, and pt to continue medical treatment as before,  to f/u any worsening symptoms or concerns BP Readings from Last 3 Encounters:  11/17/15 120/60  10/04/15 120/62  08/02/15 138/64   In addition to the time spent performing CPE, I spent an additional 20 minutes face to face,in which greater than 50% of this time was spent in counseling and coordination of care for patient's acute illness as documented.

## 2015-11-17 NOTE — Progress Notes (Signed)
Subjective:    Patient ID: Gail Lynch, female    DOB: 1941-08-21, 75 y.o.   MRN: [id number]  HPI   Here for wellness and f/u;  Overall doing ok;  Pt denies Chest pain, worsening SOB, DOE, wheezing, orthopnea, PND, worsening LE edema, palpitations, dizziness or syncope.  Pt denies neurological change such as new headache, facial or extremity weakness.  Pt denies polydipsia, polyuria, or low sugar symptoms. Pt states overall good compliance with treatment and medications, good tolerability, and has been trying to follow appropriate diet.  Pt denies worsening depressive symptoms, suicidal ideation or panic. No fever, night sweats, wt loss, loss of appetite, or other constitutional symptoms.  Pt states good ability with ADL's, has low fall risk, home safety reviewed and adequate, no other significant changes in hearing or vision, and only occasionally active with exercise.  No current complaints.  Admits to some dietary indiscretion and lack of significant exercise. Wt Readings from Last 3 Encounters:  11/17/15 223 lb (101.152 kg)  10/04/15 225 lb (102.059 kg)  08/02/15 223 lb (101.152 kg)   Past Medical History  Diagnosis Date  . Hypertension   . Hard of hearing     hear aids - left  . Dysplasia of cervix, low grade (CIN 1)     s/p cervical conization  . HPV (human papilloma virus) infection   . Glaucoma   . Hyperlipidemia   . Colon polyps   . Glaucoma 12/18/2011  . Carotid stenosis 06/18/2012  . PONV (postoperative nausea and vomiting)   . Mycotic toenails 02/19/2013  . SVD (spontaneous vaginal delivery)     x 4  . Asthma     hx as a child - no inhaler, no problems  . Intrinsic asthma, unspecified 12/18/2011  . Diabetes mellitus     Type 2  . Arthritis     gout in feet   Past Surgical History  Procedure Laterality Date  . Knee surgery  2010    patella fracture  . Dilation and curettage of uterus  2000  . Breast biopsy  2010  . Breast biopsy  1991  . Multiple tooth extractions     . Hysteroscopy w/d&c N/A 03/04/2014    Procedure: DILATATION AND CURETTAGE /HYSTEROSCOPY;  Surgeon: Lavonia Drafts, MD;  Location: [facility];  Service: Gynecology;  Laterality: N/A;  . Colonoscopy    . Polypectomy      reports that she quit smoking about 35 years ago. Her smoking use included Cigarettes. She has quit using smokeless tobacco. She reports that she does not drink alcohol or use illicit drugs. family history includes Diabetes in her brother; Heart disease in her brother, father, and mother. There is no history of Colon cancer, Rectal cancer, Stomach cancer, or Esophageal cancer. Allergies  Allergen Reactions  . Aspirin Other (See Comments)    BLEEDING BEHIND EYES  . Penicillins    Current Outpatient Prescriptions on File Prior to Visit  Medication Sig Dispense Refill  . acetaminophen (TYLENOL) 500 MG tablet Take 1,000 mg by mouth every 6 (six) hours as needed.    Marland Kitchen allopurinol (ZYLOPRIM) 300 MG tablet Take 0.5 tablets (150 mg total) by mouth daily. 45 tablet 3  . ammonium lactate (LAC-HYDRIN) 12 % cream Apply topically as needed for dry skin. 385 g 1  . atorvastatin (LIPITOR) 40 MG tablet TAKE 1 TABLET BY MOUTH DAILY 90 tablet 3  . Cholecalciferol (VITAMIN D3) 1000 UNITS CAPS Take 1 capsule by mouth daily.    Marland Kitchen  fish oil-omega-3 fatty acids 1000 MG capsule Take 2 g by mouth daily.    . Flaxseed, Linseed, (FLAX SEED OIL) 1000 MG CAPS Take 2 capsules by mouth daily.     . Garlic 123XX123 MG CAPS Take 1 capsule by mouth daily.    Marland Kitchen glucose blood (ONE TOUCH ULTRA TEST) test strip 1 each by Other route 2 (two) times daily. And lancets 2/day 250.01 180 each 3  . insulin glargine (LANTUS) 100 UNIT/ML injection Inject 0.9 mLs (90 Units total) into the skin every morning. And syringes 1/day 30 mL 11  . Misc Natural Products (COLON CLEANSER PO) Take by mouth.    . polyethylene glycol (MIRALAX / GLYCOLAX) packet Take 17 g by mouth daily as needed.     . pyridoxine (CVS VITAMIN B-6)  200 MG tablet Take 200 mg by mouth daily.    . valsartan-hydrochlorothiazide (DIOVAN-HCT) 160-12.5 MG tablet TAKE 1 TABLET BY MOUTH EVERY DAY 30 tablet 3  . vitamin B-12 (CYANOCOBALAMIN) 500 MCG tablet Take 500 mcg by mouth daily.      No current facility-administered medications on file prior to visit.   Review of Systems Constitutional: Negative for increased diaphoresis, or other activity, appetite or siginficant weight change other than noted HENT: Negative for worsening hearing loss, ear pain, facial swelling, mouth sores and neck stiffness.   Eyes: Negative for other worsening pain, redness or visual disturbance.  Respiratory: Negative for choking or stridor Cardiovascular: Negative for other chest pain and palpitations.  Gastrointestinal: Negative for worsening diarrhea, blood in stool, or abdominal distention Genitourinary: Negative for hematuria, flank pain or change in urine volume.  Musculoskeletal: Negative for myalgias or other joint complaints.  Skin: Negative for other color change and wound or drainage.  Neurological: Negative for syncope and numbness. other than noted Hematological: Negative for adenopathy. or other swelling Psychiatric/Behavioral: Negative for hallucinations, SI, self-injury, decreased concentration or other worsening agitation.      Objective:   Physical Exam BP 120/60 mmHg  Pulse 82  Temp(Src) 97.8 F (36.6 C) (Oral)  Resp 20  Wt 223 lb (101.152 kg)  SpO2 93% VS noted,  Constitutional: Pt is oriented to person, place, and time. Appears well-developed and well-nourished, in no significant distress Head: Normocephalic and atraumatic  Eyes: Conjunctivae and EOM are normal. Pupils are equal, round, and reactive to light Right Ear: External ear normal.  Left Ear: External ear normal Nose: Nose normal.  Mouth/Throat: Oropharynx is clear and moist  Neck: Normal range of motion. Neck supple. No JVD present. No tracheal deviation present or  significant neck LA or mass Cardiovascular: Normal rate, regular rhythm, normal heart sounds and intact distal pulses.   Pulmonary/Chest: Effort normal and breath sounds without rales or wheezing  Abdominal: Soft. Bowel sounds are normal. NT. No HSM  Musculoskeletal: Normal range of motion. Exhibits no edema Lymphadenopathy: Has no cervical adenopathy.  Neurological: Pt is alert and oriented to person, place, and time. Pt has normal reflexes. No cranial nerve deficit. Motor grossly intact Skin: Skin is warm and dry. No rash noted or new ulcers Psychiatric:  Has normal mood and affect. Behavior is normal.     Assessment & Plan:

## 2015-11-17 NOTE — Assessment & Plan Note (Signed)
stable overall by history and exam, recent data reviewed with pt, and pt to continue medical treatment as before,  to f/u any worsening symptoms or concerns Lab Results  Component Value Date   HGBA1C 9.2* 10/28/2015   Pt cont's to follow with endo, encouraged better diet

## 2015-11-17 NOTE — Assessment & Plan Note (Signed)
stable overall by history and exam, recent data reviewed with pt, and pt to continue medical treatment as before,  to f/u any worsening symptoms or concerns Lab Results  Component Value Date   LDLCALC 93 10/28/2015

## 2015-12-10 ENCOUNTER — Other Ambulatory Visit: Payer: Self-pay | Admitting: Internal Medicine

## 2015-12-22 ENCOUNTER — Other Ambulatory Visit: Payer: Self-pay | Admitting: Internal Medicine

## 2016-01-03 ENCOUNTER — Ambulatory Visit (INDEPENDENT_AMBULATORY_CARE_PROVIDER_SITE_OTHER): Payer: Medicare Other | Admitting: Endocrinology

## 2016-01-03 ENCOUNTER — Encounter: Payer: Self-pay | Admitting: Endocrinology

## 2016-01-03 VITALS — BP 122/60 | HR 77 | Temp 98.3°F | Ht 62.0 in | Wt 222.0 lb

## 2016-01-03 DIAGNOSIS — Z794 Long term (current) use of insulin: Secondary | ICD-10-CM | POA: Insufficient documentation

## 2016-01-03 DIAGNOSIS — E11319 Type 2 diabetes mellitus with unspecified diabetic retinopathy without macular edema: Secondary | ICD-10-CM

## 2016-01-03 DIAGNOSIS — E119 Type 2 diabetes mellitus without complications: Secondary | ICD-10-CM | POA: Insufficient documentation

## 2016-01-03 LAB — POCT GLYCOSYLATED HEMOGLOBIN (HGB A1C): HEMOGLOBIN A1C: 8.5

## 2016-01-03 MED ORDER — INSULIN GLARGINE 100 UNIT/ML ~~LOC~~ SOLN: 80.0000 [IU] | SUBCUTANEOUS | Status: DC

## 2016-01-03 NOTE — Progress Notes (Signed)
Subjective:    Patient ID: Gail Lynch, female    DOB: May 01, 1941, 75 y.o.   MRN: [id number]  HPI Pt returns for f/u of diabetes mellitus: DM type: Insulin-requiring type 2. Dx'ed: 0000000 Complications: polyneuropathy and retinopathy.   Therapy: insulin since soon after dx.   GDM: never. DKA: never Severe hypoglycemia: never Pancreatitis: never Other: she has chosen a simple insulin regimen; she declines to increase frequency of injections; she cannot have weight-loss surgery, due to having medicaid.   Interval history: she brings a record of her cbg's which i have reviewed today.  It varies from 70-200's.  It is in general lowest in the fasting state.  pt states she feels well in general.  She takes lantus, 80 units qam.   Past Medical History  Diagnosis Date  . Hypertension   . Hard of hearing     hear aids - left  . Dysplasia of cervix, low grade (CIN 1)     s/p cervical conization  . HPV (human papilloma virus) infection   . Glaucoma   . Hyperlipidemia   . Colon polyps   . Glaucoma 12/18/2011  . Carotid stenosis 06/18/2012  . PONV (postoperative nausea and vomiting)   . Mycotic toenails 02/19/2013  . SVD (spontaneous vaginal delivery)     x 4  . Asthma     hx as a child - no inhaler, no problems  . Intrinsic asthma, unspecified 12/18/2011  . Diabetes mellitus     Type 2  . Arthritis     gout in feet    Past Surgical History  Procedure Laterality Date  . Knee surgery  2010    patella fracture  . Dilation and curettage of uterus  2000  . Breast biopsy  2010  . Breast biopsy  1991  . Multiple tooth extractions    . Hysteroscopy w/d&c N/A 03/04/2014    Procedure: DILATATION AND CURETTAGE /HYSTEROSCOPY;  Surgeon: Lavonia Drafts, MD;  Location: [facility];  Service: Gynecology;  Laterality: N/A;  . Colonoscopy    . Polypectomy      Social History   Social History  . Marital Status: Widowed    Spouse Name: N/A  . Number of Children: 3  . Years of Education:  N/A   Occupational History  . retired    Social History Main Topics  . Smoking status: Former Smoker    Types: Cigarettes    Quit date: 08/21/1980  . Smokeless tobacco: Former Systems developer  . Alcohol Use: No  . Drug Use: No  . Sexual Activity: Yes    Birth Control/ Protection: Post-menopausal   Other Topics Concern  . Not on file   Social History Narrative    Current Outpatient Prescriptions on File Prior to Visit  Medication Sig Dispense Refill  . acetaminophen (TYLENOL) 500 MG tablet Take 1,000 mg by mouth every 6 (six) hours as needed.    Marland Kitchen allopurinol (ZYLOPRIM) 300 MG tablet Take 0.5 tablets (150 mg total) by mouth daily. 45 tablet 3  . ammonium lactate (LAC-HYDRIN) 12 % cream Apply topically as needed for dry skin. 385 g 1  . atorvastatin (LIPITOR) 40 MG tablet TAKE 1 TABLET BY MOUTH DAILY 90 tablet 0  . Cholecalciferol (VITAMIN D3) 1000 UNITS CAPS Take 1 capsule by mouth daily.    . fish oil-omega-3 fatty acids 1000 MG capsule Take 2 g by mouth daily.    . Flaxseed, Linseed, (FLAX SEED OIL) 1000 MG CAPS Take 2 capsules by mouth  daily.     . Garlic 123XX123 MG CAPS Take 1 capsule by mouth daily.    Marland Kitchen glucose blood (ONE TOUCH ULTRA TEST) test strip 1 each by Other route 2 (two) times daily. And lancets 2/day 250.01 180 each 3  . Misc Natural Products (COLON CLEANSER PO) Take by mouth.    . polyethylene glycol (MIRALAX / GLYCOLAX) packet Take 17 g by mouth daily as needed.     . pyridoxine ([employer] VITAMIN B-6) 200 MG tablet Take 200 mg by mouth daily.    . valsartan-hydrochlorothiazide (DIOVAN-HCT) 160-12.5 MG tablet TAKE 1 TABLET BY MOUTH EVERY DAY 30 tablet 0  . vitamin B-12 (CYANOCOBALAMIN) 500 MCG tablet Take 500 mcg by mouth daily.      No current facility-administered medications on file prior to visit.    Allergies  Allergen Reactions  . Aspirin Other (See Comments)    BLEEDING BEHIND EYES  . Penicillins     Family History  Problem Relation Age of Onset  . Diabetes  Brother   . Heart disease Brother   . Heart disease Mother   . Heart disease Father   . Asthma    . Colon cancer Neg Hx   . Rectal cancer Neg Hx   . Stomach cancer Neg Hx   . Esophageal cancer Neg Hx     BP 122/60 mmHg  Pulse 77  Temp(Src) 98.3 F (36.8 C) (Oral)  Ht 5\' 2"  (1.575 m)  Wt 222 lb (100.699 kg)  BMI 40.59 kg/m2  SpO2 95%  Review of Systems She denies hypoglycemia.      Objective:   Physical Exam VITAL SIGNS:  See vs page.  GENERAL: no distress. Pulses: dorsalis pedis intact bilat.   MSK: no deformity of the feet.  CV: 1+ bilat leg edema.   Skin:  no ulcer on the feet.  normal color and temp on the feet. Neuro: sensation is intact to touch on the feet.    A1c=8.5%    Assessment & Plan:  DM: i advised pt to change to a faster-acting QD insulin, but she declines.   Patient is advised the following: Patient Instructions  check your blood sugar twice a day.  vary the time of day when you check, between before the 3 meals, and at bedtime.  also check if you have symptoms of your blood sugar being too high or too low.  please keep a record of the readings and bring it to your next appointment here.  please call us sooner if your blood sugar goes below 70, or if it stays over 200.  As you write it out, please also write down why it might be higher or lower on a certain day.   Please continue the same insulin for now.  On this type of insulin schedule, you should eat meals on a regular schedule.  If a meal is missed or significantly delayed, your blood sugar could go low.  Please come back for a follow-up appointment in 4-5 months.

## 2016-01-03 NOTE — Patient Instructions (Addendum)
check your blood sugar twice a day.  vary the time of day when you check, between before the 3 meals, and at bedtime.  also check if you have symptoms of your blood sugar being too high or too low.  please keep a record of the readings and bring it to your next appointment here.  please call us sooner if your blood sugar goes below 70, or if it stays over 200.  As you write it out, please also write down why it might be higher or lower on a certain day.   Please continue the same insulin for now.  On this type of insulin schedule, you should eat meals on a regular schedule.  If a meal is missed or significantly delayed, your blood sugar could go low.  Please come back for a follow-up appointment in 4-5 months.

## 2016-01-19 ENCOUNTER — Other Ambulatory Visit: Payer: Self-pay | Admitting: Internal Medicine

## 2016-01-21 ENCOUNTER — Encounter: Payer: Self-pay | Admitting: Podiatry

## 2016-01-21 ENCOUNTER — Ambulatory Visit (INDEPENDENT_AMBULATORY_CARE_PROVIDER_SITE_OTHER): Payer: Medicare Other | Admitting: Podiatry

## 2016-01-21 DIAGNOSIS — M79676 Pain in unspecified toe(s): Secondary | ICD-10-CM

## 2016-01-21 DIAGNOSIS — E1151 Type 2 diabetes mellitus with diabetic peripheral angiopathy without gangrene: Secondary | ICD-10-CM | POA: Diagnosis not present

## 2016-01-21 DIAGNOSIS — B351 Tinea unguium: Secondary | ICD-10-CM | POA: Diagnosis not present

## 2016-01-21 NOTE — Progress Notes (Signed)
Patient ID: Gail Lynch, female   DOB: 03-Aug-1941, 75 y.o.   MRN: [id number] Complaint:  Visit Type: Patient returns to my office for continued preventative foot care services. Complaint: Patient states" my nails have grown long and thick and become painful to walk and wear shoes" Patient has been diagnosed with DM with neuropathy and angiopathy.. The patient presents for preventative foot care services. No changes to ROS  Podiatric Exam: Vascular: dorsalis pedis and posterior tibial pulses are not  palpable bilateral. Capillary return is immediate. Temperature gradient is WNL. Skin turgor WNL  Sensorium: Normal Semmes Weinstein monofilament test. Normal tactile sensation bilaterally. Nail Exam: Pt has thick disfigured discolored nails with subungual debris noted bilateral entire nail hallux through fifth toenails Ulcer Exam: There is no evidence of ulcer or pre-ulcerative changes or infection. Orthopedic Exam: Muscle tone and strength are WNL. No limitations in general ROM. No crepitus or effusions noted. Foot type and digits show no abnormalities. Bony prominences are unremarkable. Skin: No Porokeratosis. No infection or ulcers.  Callus right heel.  Diagnosis:  Onychomycosis, , Pain in right toe, pain in left toes  Treatment & Plan Procedures and Treatment: Consent by patient was obtained for treatment procedures. The patient understood the discussion of treatment and procedures well. All questions were answered thoroughly reviewed. Debridement of mycotic and hypertrophic toenails, 1 through 5 bilateral and clearing of subungual debris. No ulceration, no infection noted.  Return Visit-Office Procedure: Patient instructed to return to the office for a follow up visit 3 months for continued evaluation and treatment.    Gardiner Barefoot DPM

## 2016-02-07 DIAGNOSIS — E109 Type 1 diabetes mellitus without complications: Secondary | ICD-10-CM | POA: Diagnosis not present

## 2016-02-14 DIAGNOSIS — M25551 Pain in right hip: Secondary | ICD-10-CM | POA: Diagnosis not present

## 2016-02-14 DIAGNOSIS — M25561 Pain in right knee: Secondary | ICD-10-CM | POA: Diagnosis not present

## 2016-02-18 ENCOUNTER — Other Ambulatory Visit: Payer: Self-pay | Admitting: Internal Medicine

## 2016-03-07 ENCOUNTER — Other Ambulatory Visit: Payer: Self-pay | Admitting: Internal Medicine

## 2016-03-09 ENCOUNTER — Other Ambulatory Visit: Payer: Self-pay | Admitting: Internal Medicine

## 2016-04-16 IMAGING — CT CT ABD-PELV W/ CM
1 of 3 series · 13 of 32 positions shown, 18 images · IV contrast (OMNIPAQUE)
Comparison: None.

CLINICAL DATA: Pelvic pain following hysteroscopy procedure

EXAM:
CT ABDOMEN AND PELVIS WITH CONTRAST
TECHNIQUE: Multidetector CT imaging of the abdomen and pelvis was performed
using the standard protocol following bolus administration of
intravenous contrast.
CONTRAST:  100mL OMNIPAQUE IOHEXOL 300 MG/ML  SOLN

[Series 2: routine abdomen/pelvis with · axial · 0.86mm/px · z∈[-570,-164]mm · 13 of 91 slices shown, 18 images]
[im 5/91  soft-tissue]
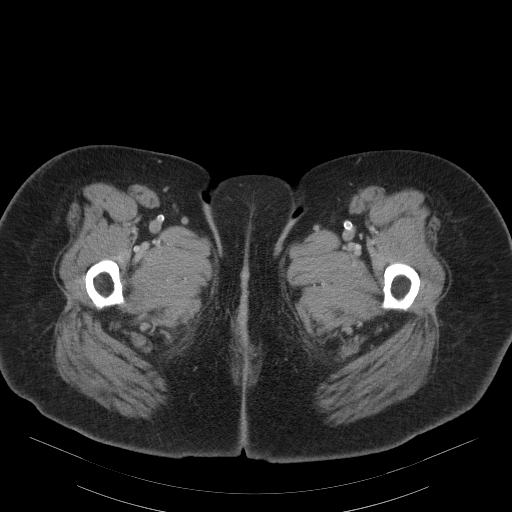
[im 5/91  bone]
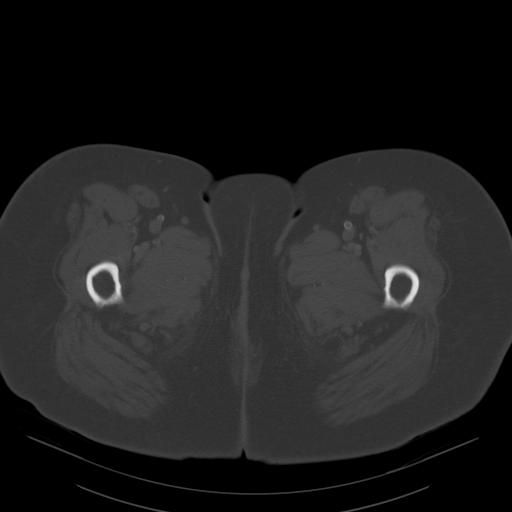
[im 15/91  soft-tissue]
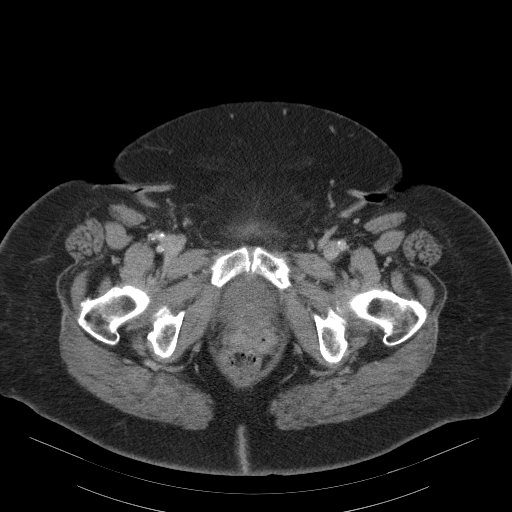
[im 19/91  soft-tissue]
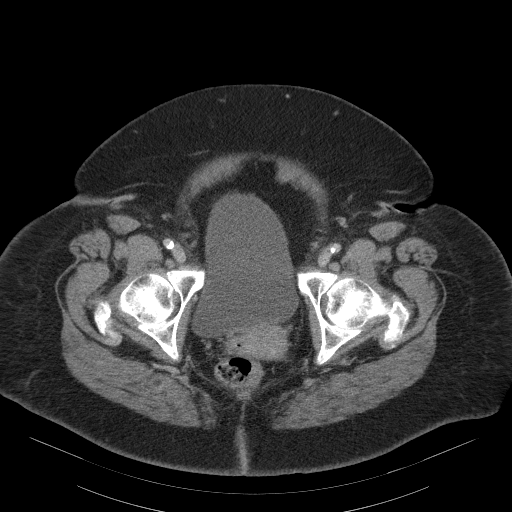
[im 29/91  soft-tissue]
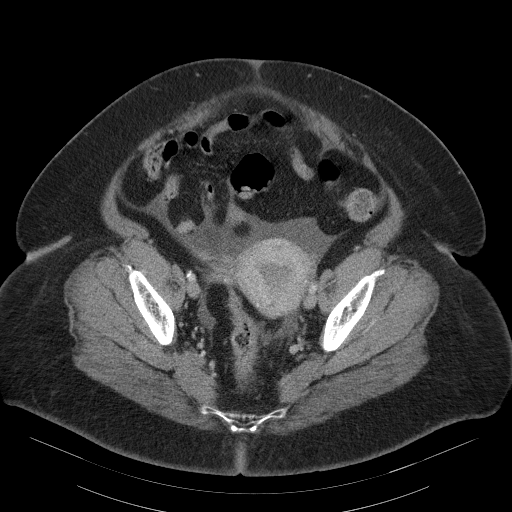
[im 34/91  soft-tissue]
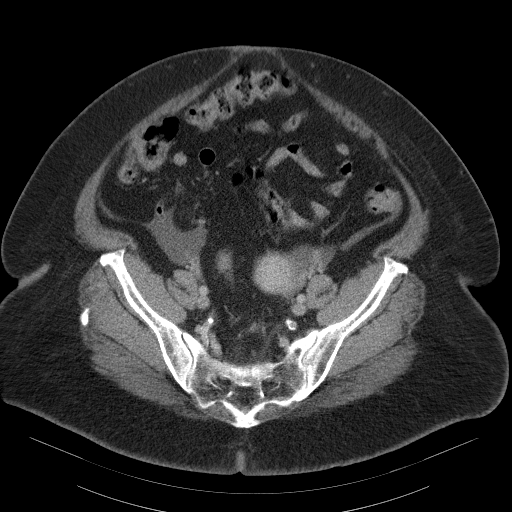
[im 43/91  soft-tissue]
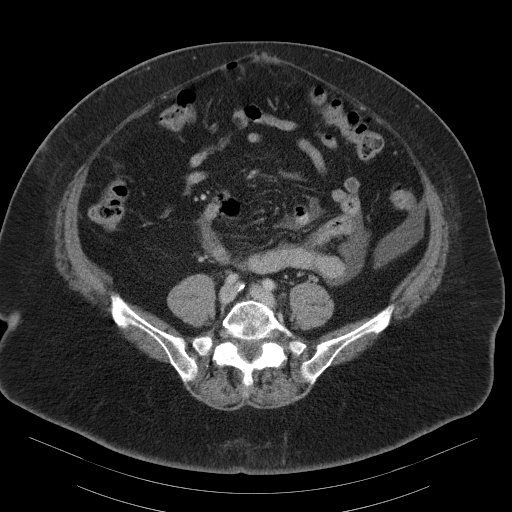
[im 48/91  soft-tissue]
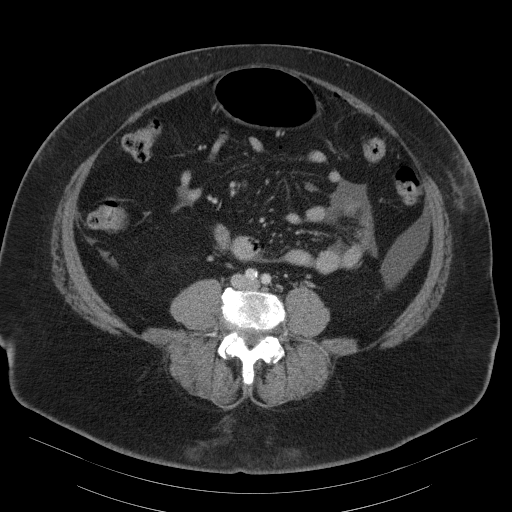
[im 57/91  soft-tissue]
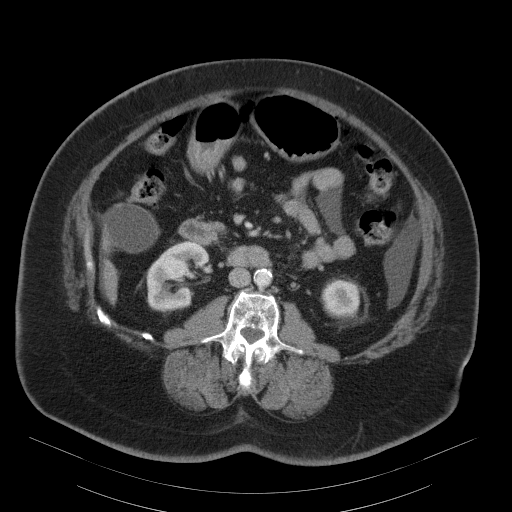
[im 62/91  soft-tissue]
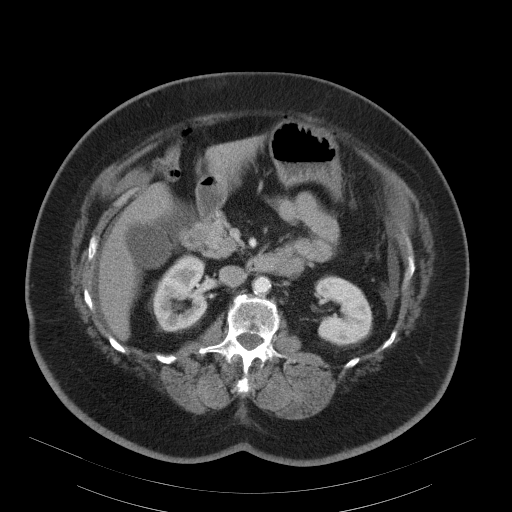
[im 62/91  bone]
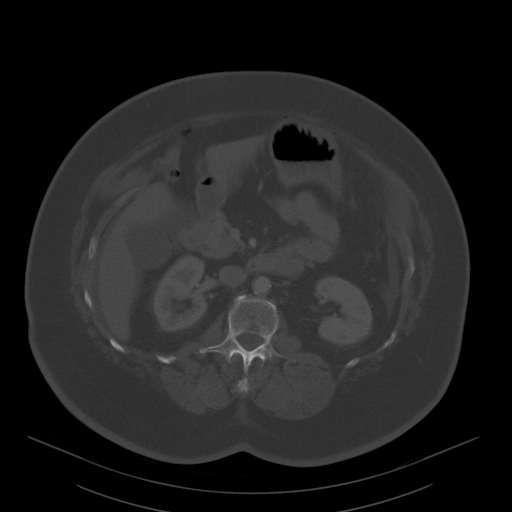
[im 72/91  soft-tissue]
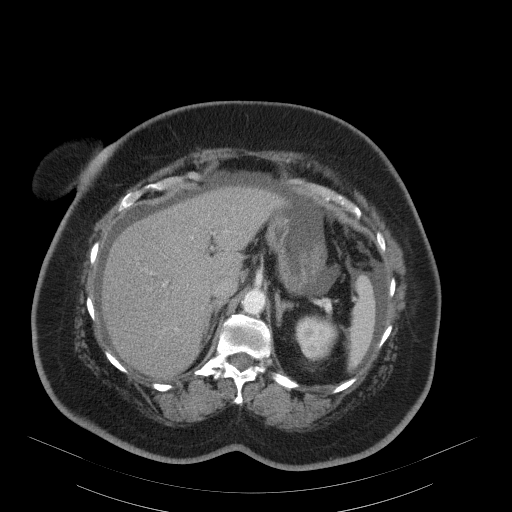
[im 72/91  lung]
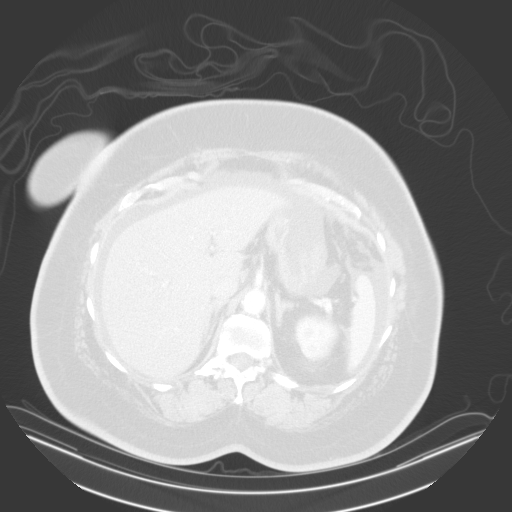
[im 76/91  soft-tissue]
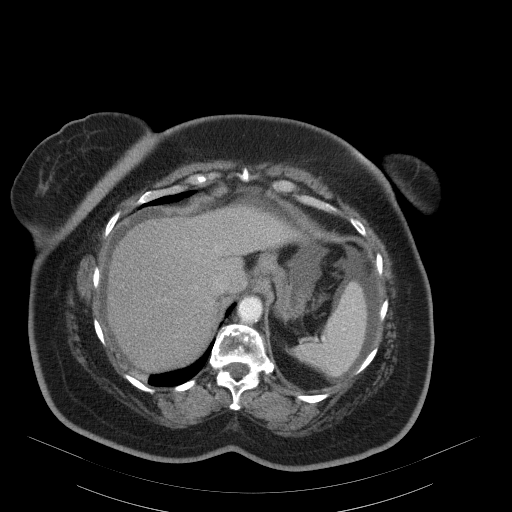
[im 76/91  lung]
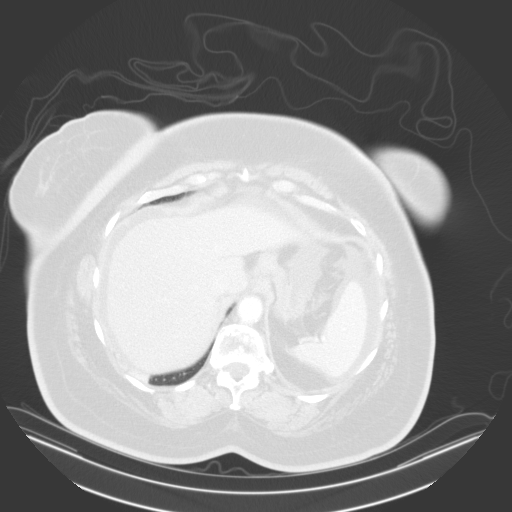
[im 81/91  lung]
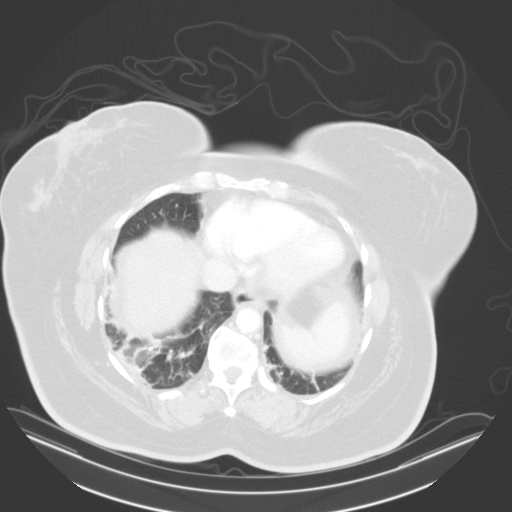
[im 86/91  soft-tissue]
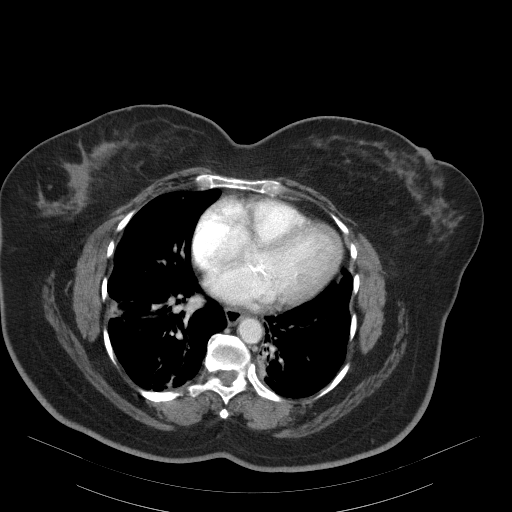
[im 86/91  lung]
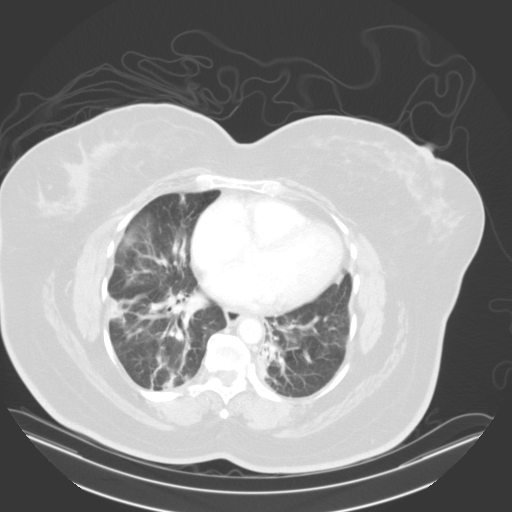

[13 of 32 positions shown; findings below may reference images not displayed]

FINDINGS: There is patchy airspace consolidation in both lung bases. There is
a small hiatal hernia.

There is moderate ascites throughout the abdomen and pelvis. Fluid
surrounds the uterus ; the fluid surrounding the uterus is
loculated. There are several foci of free air in the anterior
abdomen. These findings are concerning for uterine perforation. An
actual focus of uterine perforation is not seen, however.

No focal liver lesions are identified. The gallbladder wall is not
thickened. There is no biliary duct dilatation.

Spleen, pancreas, and adrenals appear normal. There is a 1 x 1 cm
cyst in the anterior upper to mid left kidney. There is a partially
duplicated collecting system on the right. There is no noncystic
renal mass on either side. There is no hydronephrosis in either
kidney. There is no ureteral calculus or ureterectasis on either
side.

In the pelvis, the urinary bladder is midline. There is no pelvic
mass. There are sigmoid diverticula without diverticulitis. There is
no thickening of pelvic bowel wall. Appendix appears normal. The
endometrium in the uterus appears thickened.

There is no bowel obstruction.  There is no portal venous air.

There is no adenopathy or abscess in the abdomen or pelvis. Aorta is
nonaneurysmal but does show atherosclerotic change. There is age
uncertain anterior wedging of the T12 vertebral body with marked
disc space narrowing at T11-12. There is a ventral hernia containing
only fat. There is ligamentous calcification lateral to each
superior iliac crest.
IMPRESSION: Moderate ascites. Loculated fluid surrounds the uterus. There are
foci of free intraperitoneal air within a ventral hernia as well as
in the anterior mid abdomen. These are findings consistent with
uterine perforation. A focal site of uterine perforation is not
seen, however. The endometrium is thickened. No obvious bowel
perforation is seen. Conceivably, pneumoperitoneum could be
secondary to bowel perforation which could conceivably be present as
well as uterine perforation. Close clinical surveillance in this
regard advised.

There is infiltrate in both lung bases. There is a small hiatal
hernia. There is a ventral hernia containing only fat.

There is an age uncertain compression fracture of the T12 vertebral
body with disc space narrowing at T11-12.

Critical Value/emergent results were called by telephone at the time
of interpretation on 03/04/2014 at [DATE] to Dr. Suki the covering
gynecologist, who verbally acknowledged these results.

## 2016-04-23 ENCOUNTER — Other Ambulatory Visit: Payer: Self-pay | Admitting: Internal Medicine

## 2016-04-28 ENCOUNTER — Ambulatory Visit (INDEPENDENT_AMBULATORY_CARE_PROVIDER_SITE_OTHER): Payer: Medicare Other | Admitting: Podiatry

## 2016-04-28 ENCOUNTER — Encounter: Payer: Self-pay | Admitting: Podiatry

## 2016-04-28 DIAGNOSIS — E1040 Type 1 diabetes mellitus with diabetic neuropathy, unspecified: Secondary | ICD-10-CM

## 2016-04-28 DIAGNOSIS — M79676 Pain in unspecified toe(s): Secondary | ICD-10-CM

## 2016-04-28 DIAGNOSIS — B351 Tinea unguium: Secondary | ICD-10-CM

## 2016-04-28 DIAGNOSIS — Q6650 Congenital pes planus, unspecified foot: Secondary | ICD-10-CM

## 2016-04-28 DIAGNOSIS — E1151 Type 2 diabetes mellitus with diabetic peripheral angiopathy without gangrene: Secondary | ICD-10-CM

## 2016-04-28 NOTE — Progress Notes (Signed)
Patient ID: Gail Lynch, female   DOB: [birth year], 75 y.o.   MRN: [id number] Complaint:  Visit Type: Patient returns to my office for continued preventative foot care services. Complaint: Patient states" my nails have grown long and thick and become painful to walk and wear shoes" Patient has been diagnosed with DM with neuropathy and angiopathy.. The patient presents for preventative foot care services. No changes to ROS  Podiatric Exam: Vascular: dorsalis pedis and posterior tibial pulses are not  palpable bilateral. Capillary return is immediate. Temperature gradient is WNL. Skin turgor WNL  Sensorium: Normal Semmes Weinstein monofilament test. Normal tactile sensation bilaterally. Nail Exam: Pt has thick disfigured discolored nails with subungual debris noted bilateral entire nail hallux through fifth toenails Ulcer Exam: There is no evidence of ulcer or pre-ulcerative changes or infection. Orthopedic Exam: Muscle tone and strength are WNL. No limitations in general ROM. No crepitus or effusions noted. Foot type and digits show no abnormalities. Bony prominences are unremarkable. Pes planus Skin: No Porokeratosis. No infection or ulcers.  Callus right heel.  Diagnosis:  Onychomycosis, , Pain in right toe, pain in left toes  Treatment & Plan Procedures and Treatment: Consent by patient was obtained for treatment procedures. The patient understood the discussion of treatment and procedures well. All questions were answered thoroughly reviewed. Debridement of mycotic and hypertrophic toenails, 1 through 5 bilateral and clearing of subungual debris. No ulceration, no infection noted. Initiate diabetic shoe paperwork for angiopathy and pes planus. Return Visit-Office Procedure: Patient instructed to return to the office for a follow up visit 3 months for continued evaluation and treatment.   Gardiner Barefoot DPM    Gardiner Barefoot DPM

## 2016-05-09 DIAGNOSIS — E109 Type 1 diabetes mellitus without complications: Secondary | ICD-10-CM | POA: Diagnosis not present

## 2016-05-10 ENCOUNTER — Other Ambulatory Visit (INDEPENDENT_AMBULATORY_CARE_PROVIDER_SITE_OTHER): Payer: Medicare Other

## 2016-05-10 DIAGNOSIS — IMO0002 Reserved for concepts with insufficient information to code with codable children: Secondary | ICD-10-CM

## 2016-05-10 DIAGNOSIS — E1039 Type 1 diabetes mellitus with other diabetic ophthalmic complication: Secondary | ICD-10-CM

## 2016-05-10 DIAGNOSIS — E1065 Type 1 diabetes mellitus with hyperglycemia: Principal | ICD-10-CM

## 2016-05-10 LAB — LIPID PANEL
CHOLESTEROL: 179 mg/dL (ref 0–200)
HDL: 37.7 mg/dL — AB (ref >39.00)
LDL CALC: 119 mg/dL — AB (ref 0–99)
NonHDL: 141.38
Total CHOL/HDL Ratio: 5
Triglycerides: 114 mg/dL (ref 0.0–149.0)
VLDL: 22.8 mg/dL (ref 0.0–40.0)

## 2016-05-10 LAB — HEPATIC FUNCTION PANEL
ALK PHOS: 83 U/L (ref 39–117)
ALT: 12 U/L (ref 0–35)
AST: 15 U/L (ref 0–37)
Albumin: 3.6 g/dL (ref 3.5–5.2)
BILIRUBIN DIRECT: 0 mg/dL (ref 0.0–0.3)
Total Bilirubin: 0.3 mg/dL (ref 0.2–1.2)
Total Protein: 7.2 g/dL (ref 6.0–8.3)

## 2016-05-10 LAB — BASIC METABOLIC PANEL
BUN: 24 mg/dL — ABNORMAL HIGH (ref 6–23)
CALCIUM: 9.1 mg/dL (ref 8.4–10.5)
CO2: 28 meq/L (ref 19–32)
CREATININE: 1.3 mg/dL — AB (ref 0.40–1.20)
Chloride: 107 mEq/L (ref 96–112)
GFR: 51.04 mL/min — AB (ref >60.00)
GLUCOSE: 76 mg/dL (ref 70–99)
Potassium: 4.1 mEq/L (ref 3.5–5.1)
Sodium: 143 mEq/L (ref 135–145)

## 2016-05-10 LAB — HEMOGLOBIN A1C: HEMOGLOBIN A1C: 8.5 % — AB (ref 4.6–6.5)

## 2016-05-17 ENCOUNTER — Ambulatory Visit (INDEPENDENT_AMBULATORY_CARE_PROVIDER_SITE_OTHER): Payer: Medicare Other | Admitting: Internal Medicine

## 2016-05-17 VITALS — BP 138/72 | HR 91 | Temp 97.9°F | Resp 20 | Wt 224.4 lb

## 2016-05-17 DIAGNOSIS — E11319 Type 2 diabetes mellitus with unspecified diabetic retinopathy without macular edema: Secondary | ICD-10-CM

## 2016-05-17 DIAGNOSIS — I10 Essential (primary) hypertension: Secondary | ICD-10-CM

## 2016-05-17 DIAGNOSIS — Z794 Long term (current) use of insulin: Secondary | ICD-10-CM

## 2016-05-17 DIAGNOSIS — N183 Chronic kidney disease, stage 3 unspecified: Secondary | ICD-10-CM

## 2016-05-17 DIAGNOSIS — Z0001 Encounter for general adult medical examination with abnormal findings: Secondary | ICD-10-CM

## 2016-05-17 DIAGNOSIS — R6889 Other general symptoms and signs: Secondary | ICD-10-CM

## 2016-05-17 DIAGNOSIS — E785 Hyperlipidemia, unspecified: Secondary | ICD-10-CM

## 2016-05-17 NOTE — Patient Instructions (Signed)
Please remember to watch your diet closely  Please continue all other medications as before, and refills have been done if requested.  Please have the pharmacy call with any other refills you may need.  Please continue your efforts at being more active, low cholesterol diet, and weight control.  Please keep your appointments with your specialists as you may have planned  Please return in 6 months, or sooner if needed, with Lab testing done 3-5 days before

## 2016-05-17 NOTE — Progress Notes (Signed)
Subjective:    Patient ID: Gail Lynch, female    DOB: 06/20/1941, 75 y.o.   MRN: [id number]  HPI  Here to f/u; overall doing ok,  Pt denies chest pain, increasing sob or doe, wheezing, orthopnea, PND, increased LE swelling, palpitations, dizziness or syncope.  Pt denies new neurological symptoms such as new headache, or facial or extremity weakness or numbness.  Pt denies polydipsia, polyuria, or low sugar episode.   Pt denies new neurological symptoms such as new headache, or facial or extremity weakness or numbness.   Pt states overall good compliance with meds, mostly trying to follow appropriate diet, with wt overall stable,  but Gail exercise however. Wt Readings from Last 3 Encounters:  05/17/16 224 lb 6 oz (101.8 kg)  01/03/16 222 lb (100.7 kg)  11/17/15 223 lb (101.2 kg)  Does not want higher lipitor. No new complaints Past Medical History:  Diagnosis Date  . Arthritis    gout in feet  . Asthma    hx as a child - no inhaler, no problems  . Carotid stenosis 06/18/2012  . Colon polyps   . Diabetes mellitus    Type 2  . Dysplasia of cervix, low grade (CIN 1)    s/p cervical conization  . Glaucoma   . Glaucoma 12/18/2011  . Hard of hearing    hear aids - left  . HPV (human papilloma virus) infection   . Hyperlipidemia   . Hypertension   . Intrinsic asthma, unspecified 12/18/2011  . Mycotic toenails 02/19/2013  . PONV (postoperative nausea and vomiting)   . SVD (spontaneous vaginal delivery)    x 4   Past Surgical History:  Procedure Laterality Date  . BREAST BIOPSY  2010  . BREAST BIOPSY  1991  . COLONOSCOPY    . DILATION AND CURETTAGE OF UTERUS  2000  . HYSTEROSCOPY W/D&C N/A 03/04/2014   Procedure: DILATATION AND CURETTAGE /HYSTEROSCOPY;  Surgeon: Lavonia Drafts, MD;  Location: [facility];  Service: Gynecology;  Laterality: N/A;  . KNEE SURGERY  2010   patella fracture  . MULTIPLE TOOTH EXTRACTIONS    . POLYPECTOMY      reports that she quit smoking about  35 years ago. Her smoking use included Cigarettes. She has quit using smokeless tobacco. She reports that she does not drink alcohol or use drugs. family history includes Diabetes in her brother; Heart disease in her brother, father, and mother. Allergies  Allergen Reactions  . Aspirin Other (See Comments)    BLEEDING BEHIND EYES  . Penicillins    Current Outpatient Prescriptions on File Prior to Visit  Medication Sig Dispense Refill  . acetaminophen (TYLENOL) 500 MG tablet Take 1,000 mg by mouth every 6 (six) hours as needed.    Marland Kitchen allopurinol (ZYLOPRIM) 300 MG tablet TAKE 1/2 TABLET(150 MG) BY MOUTH DAILY 45 tablet 0  . ammonium lactate (LAC-HYDRIN) 12 % cream Apply topically as needed for dry skin. 385 g 1  . atorvastatin (LIPITOR) 40 MG tablet TAKE 1 TABLET BY MOUTH DAILY 90 tablet 0  . Cholecalciferol (VITAMIN D3) 1000 UNITS CAPS Take 1 capsule by mouth daily.    . fish oil-omega-3 fatty acids 1000 MG capsule Take 2 g by mouth daily.    . Flaxseed, Linseed, (FLAX SEED OIL) 1000 MG CAPS Take 2 capsules by mouth daily.     . Garlic 123XX123 MG CAPS Take 1 capsule by mouth daily.    Marland Kitchen glucose blood (ONE TOUCH ULTRA TEST) test strip 1  each by Other route 2 (two) times daily. And lancets 2/day 250.01 180 each 3  . insulin glargine (LANTUS) 100 UNIT/ML injection Inject 0.8 mLs (80 Units total) into the skin every morning. And syringes 1/day 30 mL 11  . Misc Natural Products (COLON CLEANSER PO) Take by mouth.    . polyethylene glycol (MIRALAX / GLYCOLAX) packet Take 17 g by mouth daily as needed.     . pyridoxine ([employer]S VITAMIN B-6) 200 MG tablet Take 200 mg by mouth daily.    . valsartan-hydrochlorothiazide (DIOVAN-HCT) 160-12.5 MG tablet TAKE 1 TABLET BY MOUTH EVERY DAY 30 tablet 0  . vitamin B-12 (CYANOCOBALAMIN) 500 MCG tablet Take 500 mcg by mouth daily.      No current facility-administered medications on file prior to visit.    Review of Systems  Constitutional: Negative for unusual  diaphoresis or night sweats HENT: Negative for ear swelling or discharge Eyes: Negative for worsening visual haziness  Respiratory: Negative for choking and stridor.   Gastrointestinal: Negative for distension or worsening eructation Genitourinary: Negative for retention or change in urine volume.  Musculoskeletal: Negative for other MSK pain or swelling Skin: Negative for color change and worsening wound Neurological: Negative for tremors and numbness other than noted  Psychiatric/Behavioral: Negative for decreased concentration or agitation other than above       Objective:   Physical Exam BP 138/72   Pulse 91   Temp 97.9 F (36.6 C) (Oral)   Resp 20   Wt 224 lb 6 oz (101.8 kg)   SpO2 93%   BMI 41.04 kg/m  VS noted,  Constitutional: Pt appears in no apparent distress HENT: Head: NCAT.  Right Ear: External ear normal.  Left Ear: External ear normal.  Eyes: . Pupils are equal, round, and reactive to light. Conjunctivae and EOM are normal Neck: Normal range of motion. Neck supple.  Cardiovascular: Normal rate and regular rhythm.   Pulmonary/Chest: Effort normal and breath sounds without rales or wheezing.  Neurological: Pt is alert. Not confused , motor grossly intact Skin: Skin is warm. No rash, no LE edema Psychiatric: Pt behavior is normal. No agitation.     Assessment & Plan:

## 2016-05-17 NOTE — Progress Notes (Signed)
Pre visit review using our clinic review tool, if applicable. No additional management support is needed unless otherwise documented below in the visit note. 

## 2016-05-20 DIAGNOSIS — N183 Chronic kidney disease, stage 3 unspecified: Secondary | ICD-10-CM | POA: Insufficient documentation

## 2016-05-20 NOTE — Assessment & Plan Note (Signed)
.  stable overall by history and exam, recent data reviewed with pt, and pt to continue medical treatment as before,  to f/u any worsening symptoms or concerns Lab Results  Component Value Date   LDLCALC 119 (H) 05/10/2016   D/w pt, declines increased lipitor, for better lower chol diet

## 2016-05-20 NOTE — Assessment & Plan Note (Signed)
Mild uncontrollled, o/w stable overall by history and exam, recent data reviewed with pt, and pt to continue medical treatment as before,  to f/u any worsening symptoms or concerns Lab Results  Component Value Date   HGBA1C 8.5 (H) 05/10/2016  pt to f/u with endo as planned

## 2016-05-20 NOTE — Assessment & Plan Note (Signed)
stable overall by history and exam, recent data reviewed with pt, and pt to continue medical treatment as before,  to f/u any worsening symptoms or concerns Lab Results  Component Value Date   CREATININE 1.30 (H) 05/10/2016

## 2016-05-20 NOTE — Assessment & Plan Note (Signed)
BP Readings from Last 3 Encounters:  05/17/16 138/72  01/03/16 122/60  11/17/15 120/60   stable overall by history and exam, recent data reviewed with pt, and pt to continue medical treatment as before,  to f/u any worsening symptoms or concerns

## 2016-05-30 DIAGNOSIS — E113313 Type 2 diabetes mellitus with moderate nonproliferative diabetic retinopathy with macular edema, bilateral: Secondary | ICD-10-CM | POA: Diagnosis not present

## 2016-05-30 DIAGNOSIS — E11311 Type 2 diabetes mellitus with unspecified diabetic retinopathy with macular edema: Secondary | ICD-10-CM | POA: Diagnosis not present

## 2016-06-05 ENCOUNTER — Other Ambulatory Visit: Payer: Self-pay | Admitting: Internal Medicine

## 2016-06-05 ENCOUNTER — Ambulatory Visit (INDEPENDENT_AMBULATORY_CARE_PROVIDER_SITE_OTHER): Payer: Medicare Other | Admitting: Endocrinology

## 2016-06-05 VITALS — BP 134/58 | HR 85 | Ht 62.0 in | Wt 221.0 lb

## 2016-06-05 DIAGNOSIS — Z794 Long term (current) use of insulin: Secondary | ICD-10-CM | POA: Diagnosis not present

## 2016-06-05 DIAGNOSIS — E11319 Type 2 diabetes mellitus with unspecified diabetic retinopathy without macular edema: Secondary | ICD-10-CM | POA: Diagnosis not present

## 2016-06-05 NOTE — Progress Notes (Signed)
Subjective:    Patient ID: Gail Lynch, female    DOB: [birth year], 75 y.o.   MRN: [id number]  HPI Pt returns for f/u of diabetes mellitus: DM type: Insulin-requiring type 2. Dx'ed: 0000000 Complications: polyneuropathy and retinopathy.   Therapy: insulin since soon after dx.   GDM: never. DKA: never Severe hypoglycemia: never.   Pancreatitis: never.   Other: she has chosen a simple insulin regimen; she declines to increase frequency of injections; she cannot have weight-loss surgery, due to having medicaid.   Interval history:  no cbg record, but states cbg's vary from 70-200's.  It is in general higher as the day goes on.  Main symptom is chronic generalized pain.  She takes lantus, 70-80 units qam.    Past Medical History:  Diagnosis Date  . Arthritis    gout in feet  . Asthma    hx as a child - no inhaler, no problems  . Carotid stenosis 06/18/2012  . Colon polyps   . Diabetes mellitus    Type 2  . Dysplasia of cervix, low grade (CIN 1)    s/p cervical conization  . Glaucoma   . Glaucoma 12/18/2011  . Hard of hearing    hear aids - left  . HPV (human papilloma virus) infection   . Hyperlipidemia   . Hypertension   . Intrinsic asthma, unspecified 12/18/2011  . Mycotic toenails 02/19/2013  . PONV (postoperative nausea and vomiting)   . SVD (spontaneous vaginal delivery)    x 4    Past Surgical History:  Procedure Laterality Date  . BREAST BIOPSY  2010  . BREAST BIOPSY  1991  . COLONOSCOPY    . DILATION AND CURETTAGE OF UTERUS  2000  . HYSTEROSCOPY W/D&C N/A 03/04/2014   Procedure: DILATATION AND CURETTAGE /HYSTEROSCOPY;  Surgeon: Lavonia Drafts, MD;  Location: [facility];  Service: Gynecology;  Laterality: N/A;  . KNEE SURGERY  2010   patella fracture  . MULTIPLE TOOTH EXTRACTIONS    . POLYPECTOMY      Social History   Social History  . Marital status: Widowed    Spouse name: N/A  . Number of children: 3  . Years of education: N/A   Occupational  History  . retired    Social History Main Topics  . Smoking status: Former Smoker    Types: Cigarettes    Quit date: 08/21/1980  . Smokeless tobacco: Former Systems developer  . Alcohol use No  . Drug use: No  . Sexual activity: Yes    Birth control/ protection: Post-menopausal   Other Topics Concern  . Not on file   Social History Narrative  . No narrative on file    Current Outpatient Prescriptions on File Prior to Visit  Medication Sig Dispense Refill  . acetaminophen (TYLENOL) 500 MG tablet Take 1,000 mg by mouth every 6 (six) hours as needed.    Marland Kitchen allopurinol (ZYLOPRIM) 300 MG tablet TAKE 1/2 TABLET(150 MG) BY MOUTH DAILY 45 tablet 0  . ammonium lactate (LAC-HYDRIN) 12 % cream Apply topically as needed for dry skin. 385 g 1  . Cholecalciferol (VITAMIN D3) 1000 UNITS CAPS Take 1 capsule by mouth daily.    . fish oil-omega-3 fatty acids 1000 MG capsule Take 2 g by mouth daily.    . Flaxseed, Linseed, (FLAX SEED OIL) 1000 MG CAPS Take 2 capsules by mouth daily.     . Garlic 123XX123 MG CAPS Take 1 capsule by mouth daily.    Marland Kitchen glucose blood (  ONE TOUCH ULTRA TEST) test strip 1 each by Other route 2 (two) times daily. And lancets 2/day 250.01 180 each 3  . insulin glargine (LANTUS) 100 UNIT/ML injection Inject 0.8 mLs (80 Units total) into the skin every morning. And syringes 1/day 30 mL 11  . Misc Natural Products (COLON CLEANSER PO) Take by mouth.    . polyethylene glycol (MIRALAX / GLYCOLAX) packet Take 17 g by mouth daily as needed.     . pyridoxine ([employer] VITAMIN B-6) 200 MG tablet Take 200 mg by mouth daily.    . valsartan-hydrochlorothiazide (DIOVAN-HCT) 160-12.5 MG tablet TAKE 1 TABLET BY MOUTH EVERY DAY 30 tablet 0  . vitamin B-12 (CYANOCOBALAMIN) 500 MCG tablet Take 500 mcg by mouth daily.      No current facility-administered medications on file prior to visit.     Allergies  Allergen Reactions  . Aspirin Other (See Comments)    BLEEDING BEHIND EYES  . Penicillins     Family  History  Problem Relation Age of Onset  . Diabetes Brother   . Heart disease Brother   . Heart disease Mother   . Heart disease Father   . Asthma    . Colon cancer Neg Hx   . Rectal cancer Neg Hx   . Stomach cancer Neg Hx   . Esophageal cancer Neg Hx     BP (!) 134/58   Pulse 85   Ht 5\' 2"  (1.575 m)   Wt 221 lb (100.2 kg)   SpO2 98%   BMI 40.42 kg/m    Review of Systems Denies LOC.      Objective:   Physical Exam VITAL SIGNS:  See vs page.  GENERAL: no distress. Pulses: dorsalis pedis intact bilat.   MSK: no deformity of the feet.  CV: trace bilat leg edema.   Skin:  no ulcer on the feet.  normal color and temp on the feet.  There is a hyperpigmanted callus at the plantar aspect of the right foot.   Neuro: sensation is intact to touch on the feet.     Lab Results  Component Value Date   HGBA1C 8.5 (H) 05/10/2016       Assessment & Plan:  Insulin-requiring type 2 DM: worse.  I advised changing to QAM NPH, or adding humalog at supper.  She declines both.

## 2016-06-05 NOTE — Patient Instructions (Addendum)
check your blood sugar twice a day.  vary the time of day when you check, between before the 3 meals, and at bedtime.  also check if you have symptoms of your blood sugar being too high or too low.  please keep a record of the readings and bring it to your next appointment here.  please call us sooner if your blood sugar goes below 70, or if it stays over 200.  As you write it out, please also write down why it might be higher or lower on a certain day.   Please continue the same insulin for now.  On this type of insulin schedule, you should eat meals on a regular schedule.  If a meal is missed or significantly delayed, your blood sugar could go low.  Please come back for a follow-up appointment in 3 months.

## 2016-06-26 ENCOUNTER — Other Ambulatory Visit: Payer: Self-pay | Admitting: Internal Medicine

## 2016-07-09 ENCOUNTER — Other Ambulatory Visit: Payer: Self-pay | Admitting: Endocrinology

## 2016-07-11 DIAGNOSIS — E11311 Type 2 diabetes mellitus with unspecified diabetic retinopathy with macular edema: Secondary | ICD-10-CM | POA: Diagnosis not present

## 2016-07-11 DIAGNOSIS — E113312 Type 2 diabetes mellitus with moderate nonproliferative diabetic retinopathy with macular edema, left eye: Secondary | ICD-10-CM | POA: Diagnosis not present

## 2016-07-21 ENCOUNTER — Ambulatory Visit (INDEPENDENT_AMBULATORY_CARE_PROVIDER_SITE_OTHER): Payer: Medicare Other | Admitting: Podiatry

## 2016-07-21 ENCOUNTER — Encounter: Payer: Self-pay | Admitting: Podiatry

## 2016-07-21 VITALS — Ht 62.0 in | Wt 221.0 lb

## 2016-07-21 DIAGNOSIS — Q6650 Congenital pes planus, unspecified foot: Secondary | ICD-10-CM

## 2016-07-21 DIAGNOSIS — M79676 Pain in unspecified toe(s): Secondary | ICD-10-CM | POA: Diagnosis not present

## 2016-07-21 DIAGNOSIS — B351 Tinea unguium: Secondary | ICD-10-CM | POA: Diagnosis not present

## 2016-07-21 DIAGNOSIS — E1151 Type 2 diabetes mellitus with diabetic peripheral angiopathy without gangrene: Secondary | ICD-10-CM | POA: Diagnosis not present

## 2016-07-21 NOTE — Progress Notes (Signed)
Patient ID: Gail Lynch, female   DOB: June 23, 1941, 75 y.o.   MRN: [id number] Complaint:  Visit Type: Patient returns to my office for continued preventative foot care services. Complaint: Patient states" my nails have grown long and thick and become painful to walk and wear shoes" Patient has been diagnosed with DM with neuropathy and angiopathy.. The patient presents for preventative foot care services. No changes to ROS  Podiatric Exam: Vascular: dorsalis pedis and posterior tibial pulses are not  palpable bilateral. Capillary return is immediate. Temperature gradient is WNL. Skin turgor WNL  Sensorium: Normal Semmes Weinstein monofilament test. Normal tactile sensation bilaterally. Nail Exam: Pt has thick disfigured discolored nails with subungual debris noted bilateral entire nail hallux through fifth toenails Ulcer Exam: There is no evidence of ulcer or pre-ulcerative changes or infection. Orthopedic Exam: Muscle tone and strength are WNL. No limitations in general ROM. No crepitus or effusions noted. Foot type and digits show no abnormalities. Bony prominences are unremarkable. Pes planus Skin: No Porokeratosis. No infection or ulcers.  Callus right heel,  Asymptomatic..  Diagnosis:  Onychomycosis, , Pain in right toe, pain in left toes  Treatment & Plan Procedures and Treatment: Consent by patient was obtained for treatment procedures. The patient understood the discussion of treatment and procedures well. All questions were answered thoroughly reviewed. Debridement of mycotic and hypertrophic toenails, 1 through 5 bilateral and clearing of subungual debris. No ulceration, no infection noted.  Return Visit-Office Procedure: Patient instructed to return to the office for a follow up visit 3 months for continued evaluation and treatment.   Gardiner Barefoot DPM

## 2016-07-22 ENCOUNTER — Other Ambulatory Visit: Payer: Self-pay | Admitting: Internal Medicine

## 2016-07-23 ENCOUNTER — Other Ambulatory Visit: Payer: Self-pay | Admitting: Internal Medicine

## 2016-08-08 DIAGNOSIS — E113312 Type 2 diabetes mellitus with moderate nonproliferative diabetic retinopathy with macular edema, left eye: Secondary | ICD-10-CM | POA: Diagnosis not present

## 2016-08-08 DIAGNOSIS — E109 Type 1 diabetes mellitus without complications: Secondary | ICD-10-CM | POA: Diagnosis not present

## 2016-08-08 DIAGNOSIS — E11311 Type 2 diabetes mellitus with unspecified diabetic retinopathy with macular edema: Secondary | ICD-10-CM | POA: Diagnosis not present

## 2016-08-10 ENCOUNTER — Other Ambulatory Visit: Payer: Self-pay | Admitting: Endocrinology

## 2016-08-28 ENCOUNTER — Ambulatory Visit: Payer: Medicare Other

## 2016-09-04 NOTE — Progress Notes (Signed)
Subjective:    Patient ID: Gail Lynch, female    DOB: 24-Jun-1941, 76 y.o.   MRN: [id number]  HPI Pt returns for f/u of diabetes mellitus: DM type: Insulin-requiring type 2. Dx'ed: 0000000 Complications: polyneuropathy and retinopathy.   Therapy: insulin since soon after dx.   GDM: never. DKA: never Severe hypoglycemia: never.   Pancreatitis: never.   Other: she declines multiple daily injections; she declines to increase frequency of injections; she cannot have weight-loss surgery, due to having medicaid.   Interval history:  no cbg record, but states cbg's vary from 70-200's.  It is in general higher as the day goes on.  Main symptom is chronic generalized pain.  She takes lantus, 70-80 units qam.    Past Medical History:  Diagnosis Date  . Arthritis    gout in feet  . Asthma    hx as a child - no inhaler, no problems  . Carotid stenosis 06/18/2012  . Colon polyps   . Diabetes mellitus    Type 2  . Dysplasia of cervix, low grade (CIN 1)    s/p cervical conization  . Glaucoma   . Glaucoma 12/18/2011  . Hard of hearing    hear aids - left  . HPV (human papilloma virus) infection   . Hyperlipidemia   . Hypertension   . Intrinsic asthma, unspecified 12/18/2011  . Mycotic toenails 02/19/2013  . PONV (postoperative nausea and vomiting)   . SVD (spontaneous vaginal delivery)    x 4    Past Surgical History:  Procedure Laterality Date  . BREAST BIOPSY  2010  . BREAST BIOPSY  1991  . COLONOSCOPY    . DILATION AND CURETTAGE OF UTERUS  2000  . HYSTEROSCOPY W/D&C N/A 03/04/2014   Procedure: DILATATION AND CURETTAGE /HYSTEROSCOPY;  Surgeon: Lavonia Drafts, MD;  Location: [facility];  Service: Gynecology;  Laterality: N/A;  . KNEE SURGERY  2010   patella fracture  . MULTIPLE TOOTH EXTRACTIONS    . POLYPECTOMY      Social History   Social History  . Marital status: Widowed    Spouse name: N/A  . Number of children: 3  . Years of education: N/A   Occupational History   . retired    Social History Main Topics  . Smoking status: Former Smoker    Types: Cigarettes    Quit date: 08/21/1980  . Smokeless tobacco: Former Systems developer  . Alcohol use No  . Drug use: No  . Sexual activity: Yes    Birth control/ protection: Post-menopausal   Other Topics Concern  . Not on file   Social History Narrative  . No narrative on file    Current Outpatient Prescriptions on File Prior to Visit  Medication Sig Dispense Refill  . acetaminophen (TYLENOL) 500 MG tablet Take 1,000 mg by mouth every 6 (six) hours as needed.    Marland Kitchen allopurinol (ZYLOPRIM) 300 MG tablet Take 0.5 tablets (150 mg total) by mouth daily. Yearly physical w/labs due in March must see MD for future refills 45 tablet 0  . atorvastatin (LIPITOR) 40 MG tablet TAKE 1 TABLET BY MOUTH DAILY 90 tablet 3  . Cholecalciferol (VITAMIN D3) 1000 UNITS CAPS Take 1 capsule by mouth daily.    . fish oil-omega-3 fatty acids 1000 MG capsule Take 2 g by mouth daily.    . Flaxseed, Linseed, (FLAX SEED OIL) 1000 MG CAPS Take 2 capsules by mouth daily.     . Garlic 123XX123 MG CAPS Take 1  capsule by mouth daily.    Marland Kitchen glucose blood (ONE TOUCH ULTRA TEST) test strip 1 each by Other route 2 (two) times daily. And lancets 2/day 250.01 180 each 3  . Misc Natural Products (COLON CLEANSER PO) Take by mouth.    . polyethylene glycol (MIRALAX / GLYCOLAX) packet Take 17 g by mouth daily as needed.     . pyridoxine ([employer]S VITAMIN B-6) 200 MG tablet Take 200 mg by mouth daily.    . valsartan-hydrochlorothiazide (DIOVAN-HCT) 160-12.5 MG tablet Take 1 tablet by mouth daily. Yearly physical due in march must see MD for refills 90 tablet 0  . vitamin B-12 (CYANOCOBALAMIN) 500 MCG tablet Take 500 mcg by mouth daily.      No current facility-administered medications on file prior to visit.     Allergies  Allergen Reactions  . Aspirin Other (See Comments)    BLEEDING BEHIND EYES  . Penicillins     Family History  Problem Relation Age of Onset    . Diabetes Brother   . Heart disease Brother   . Heart disease Mother   . Heart disease Father   . Asthma    . Colon cancer Neg Hx   . Rectal cancer Neg Hx   . Stomach cancer Neg Hx   . Esophageal cancer Neg Hx     BP (!) 148/84   Pulse 78   Ht 5\' 4"  (1.626 m)   Wt 228 lb (103.4 kg)   SpO2 97%   BMI 39.14 kg/m   Review of Systems She denies hypoglycemia    Objective:   Physical Exam VITAL SIGNS:  See vs page.  GENERAL: no distress. Pulses: dorsalis pedis intact bilat.   MSK: no deformity of the feet.  CV: trace bilat leg edema.   Skin:  no ulcer on the feet.  normal color and temp on the feet.  There are hyperpigmanted callused at the plantar aspects of both feet.    Neuro: sensation is intact to touch on the feet.    A1c=8.4%    Assessment & Plan:  Insulin-requiring type 2 DM: she needs slightly increased rx.  Patient is advised the following: Patient Instructions  check your blood sugar twice a day.  vary the time of day when you check, between before the 3 meals, and at bedtime.  also check if you have symptoms of your blood sugar being too high or too low.  please keep a record of the readings and bring it to your next appointment here.  please call us sooner if your blood sugar goes below 70, or if it stays over 200.  As you write it out, please also write down why it might be higher or lower on a certain day.   Please take 80 units every morning, no matter what your blood sugar is.   On this type of insulin schedule, you should eat meals on a regular schedule.  If a meal is missed or significantly delayed, your blood sugar could go low.  Please come back for a follow-up appointment in 4 months.

## 2016-09-05 ENCOUNTER — Ambulatory Visit (INDEPENDENT_AMBULATORY_CARE_PROVIDER_SITE_OTHER): Payer: Medicare Other | Admitting: Endocrinology

## 2016-09-05 VITALS — BP 148/84 | HR 78 | Ht 64.0 in | Wt 228.0 lb

## 2016-09-05 DIAGNOSIS — Z794 Long term (current) use of insulin: Secondary | ICD-10-CM | POA: Diagnosis not present

## 2016-09-05 DIAGNOSIS — E11319 Type 2 diabetes mellitus with unspecified diabetic retinopathy without macular edema: Secondary | ICD-10-CM

## 2016-09-05 LAB — POCT GLYCOSYLATED HEMOGLOBIN (HGB A1C): Hemoglobin A1C: 8.4

## 2016-09-05 MED ORDER — INSULIN GLARGINE 100 UNIT/ML ~~LOC~~ SOLN: 80.0000 [IU] | SUBCUTANEOUS | 0 refills | Status: DC

## 2016-09-05 NOTE — Patient Instructions (Addendum)
check your blood sugar twice a day.  vary the time of day when you check, between before the 3 meals, and at bedtime.  also check if you have symptoms of your blood sugar being too high or too low.  please keep a record of the readings and bring it to your next appointment here.  please call us sooner if your blood sugar goes below 70, or if it stays over 200.  As you write it out, please also write down why it might be higher or lower on a certain day.   Please take 80 units every morning, no matter what your blood sugar is.   On this type of insulin schedule, you should eat meals on a regular schedule.  If a meal is missed or significantly delayed, your blood sugar could go low.  Please come back for a follow-up appointment in 4 months.

## 2016-09-20 ENCOUNTER — Telehealth: Payer: Self-pay | Admitting: Internal Medicine

## 2016-09-20 NOTE — Telephone Encounter (Signed)
Spoke with patient regarding awv. Patient stated that she is not interested at this time, but would call office back if she decides to schedule appt.

## 2016-09-26 DIAGNOSIS — E113313 Type 2 diabetes mellitus with moderate nonproliferative diabetic retinopathy with macular edema, bilateral: Secondary | ICD-10-CM | POA: Diagnosis not present

## 2016-10-03 ENCOUNTER — Ambulatory Visit (INDEPENDENT_AMBULATORY_CARE_PROVIDER_SITE_OTHER): Payer: Medicare Other | Admitting: Podiatry

## 2016-10-03 DIAGNOSIS — E1151 Type 2 diabetes mellitus with diabetic peripheral angiopathy without gangrene: Secondary | ICD-10-CM

## 2016-10-03 DIAGNOSIS — M79676 Pain in unspecified toe(s): Secondary | ICD-10-CM

## 2016-10-03 DIAGNOSIS — E1040 Type 1 diabetes mellitus with diabetic neuropathy, unspecified: Secondary | ICD-10-CM | POA: Diagnosis not present

## 2016-10-03 DIAGNOSIS — Q6650 Congenital pes planus, unspecified foot: Secondary | ICD-10-CM

## 2016-10-03 DIAGNOSIS — B351 Tinea unguium: Secondary | ICD-10-CM

## 2016-10-03 NOTE — Patient Instructions (Signed)

## 2016-10-03 NOTE — Progress Notes (Signed)
Patient ID: Gail Lynch, female   DOB: May 03, 1941, 76 y.o.   MRN: [id number] Complaint:  Visit Type: Patient returns to my office for continued preventative foot care services. Complaint: Patient states" my nails have grown long and thick and become painful to walk and wear shoes" Patient has been diagnosed with DM with neuropathy and angiopathy.. The patient presents for preventative foot care services. No changes to ROS.  She also presents for diabetic shoe pick-up.  Podiatric Exam: Vascular: dorsalis pedis and posterior tibial pulses are not  palpable bilateral. Capillary return is immediate. Temperature gradient is WNL. Skin turgor WNL  Sensorium: Normal Semmes Weinstein monofilament test. Normal tactile sensation bilaterally. Nail Exam: Pt has thick disfigured discolored nails with subungual debris noted bilateral entire nail hallux through fifth toenails Ulcer Exam: There is no evidence of ulcer or pre-ulcerative changes or infection. Orthopedic Exam: Muscle tone and strength are WNL. No limitations in general ROM. No crepitus or effusions noted. Foot type and digits show no abnormalities. Bony prominences are unremarkable. Pes planus Skin: No Porokeratosis. No infection or ulcers.  Callus right heel,  Asymptomatic..  Diagnosis:  Onychomycosis, , Pain in right toe, pain in left toes  Treatment & Plan Procedures and Treatment: Consent by patient was obtained for treatment procedures. The patient understood the discussion of treatment and procedures well. All questions were answered thoroughly reviewed. Debridement of mycotic and hypertrophic toenails, 1 through 5 bilateral and clearing of subungual debris. No ulceration, no infection noted. Dispense diabetic shoes.  Patient presents today and was dispensed 0ne pair ( two units) of medically necessary extra depth shoes with three pair( six units) of custom molded multiple density inserts. The shoes and the inserts are fitted to the patients '  feet and are noted to fit well and are free of defect.  Length and width of the shoes are also acceptable.  Patient was given written and verbal  instructions for wearing.  If any concerns arrive with the shoes or inserts, the patient is to call the office.Patient is to follow up with doctor in six weeks. Return Visit-Office Procedure: Patient instructed to return to the office for a follow up visit 3 months for continued evaluation and treatment.   Gardiner Barefoot DPM

## 2016-10-09 ENCOUNTER — Other Ambulatory Visit: Payer: Self-pay | Admitting: Endocrinology

## 2016-10-10 ENCOUNTER — Other Ambulatory Visit (INDEPENDENT_AMBULATORY_CARE_PROVIDER_SITE_OTHER): Payer: Medicare Other

## 2016-10-10 DIAGNOSIS — Z0001 Encounter for general adult medical examination with abnormal findings: Secondary | ICD-10-CM

## 2016-10-10 DIAGNOSIS — E11319 Type 2 diabetes mellitus with unspecified diabetic retinopathy without macular edema: Secondary | ICD-10-CM | POA: Diagnosis not present

## 2016-10-10 DIAGNOSIS — Z794 Long term (current) use of insulin: Secondary | ICD-10-CM

## 2016-10-10 LAB — HEPATIC FUNCTION PANEL
ALT: 12 U/L (ref 0–35)
AST: 15 U/L (ref 0–37)
Albumin: 3.7 g/dL (ref 3.5–5.2)
Alkaline Phosphatase: 75 U/L (ref 39–117)
BILIRUBIN DIRECT: 0.1 mg/dL (ref 0.0–0.3)
BILIRUBIN TOTAL: 0.3 mg/dL (ref 0.2–1.2)
TOTAL PROTEIN: 6.7 g/dL (ref 6.0–8.3)

## 2016-10-10 LAB — BASIC METABOLIC PANEL
BUN: 19 mg/dL (ref 6–23)
CHLORIDE: 109 meq/L (ref 96–112)
CO2: 28 mEq/L (ref 19–32)
CREATININE: 1.22 mg/dL — AB (ref 0.40–1.20)
Calcium: 9.3 mg/dL (ref 8.4–10.5)
GFR: 54.86 mL/min — ABNORMAL LOW (ref >60.00)
Glucose, Bld: 80 mg/dL (ref 70–99)
POTASSIUM: 4.6 meq/L (ref 3.5–5.1)
Sodium: 143 mEq/L (ref 135–145)

## 2016-10-10 LAB — URINALYSIS, ROUTINE W REFLEX MICROSCOPIC
BILIRUBIN URINE: NEGATIVE
Ketones, ur: NEGATIVE
Nitrite: NEGATIVE
PH: 6 (ref 5.0–8.0)
SPECIFIC GRAVITY, URINE: 1.01 (ref 1.000–1.030)
TOTAL PROTEIN, URINE-UPE24: NEGATIVE
UROBILINOGEN UA: 0.2 (ref 0.0–1.0)
Urine Glucose: NEGATIVE

## 2016-10-10 LAB — LIPID PANEL
CHOL/HDL RATIO: 6
Cholesterol: 204 mg/dL — ABNORMAL HIGH (ref 0–200)
HDL: 34.2 mg/dL — AB (ref >39.00)
LDL Cholesterol: 141 mg/dL — ABNORMAL HIGH (ref 0–99)
NONHDL: 169.89
TRIGLYCERIDES: 145 mg/dL (ref 0.0–149.0)
VLDL: 29 mg/dL (ref 0.0–40.0)

## 2016-10-10 LAB — CBC WITH DIFFERENTIAL/PLATELET
BASOS ABS: 0 10*3/uL (ref 0.0–0.1)
BASOS PCT: 0.7 % (ref 0.0–3.0)
EOS ABS: 0.2 10*3/uL (ref 0.0–0.7)
Eosinophils Relative: 2.9 % (ref 0.0–5.0)
HCT: 36.1 % (ref 36.0–46.0)
Hemoglobin: 11.7 g/dL — ABNORMAL LOW (ref 12.0–15.0)
LYMPHS ABS: 2.2 10*3/uL (ref 0.7–4.0)
LYMPHS PCT: 34.4 % (ref 12.0–46.0)
MCHC: 32.5 g/dL (ref 30.0–36.0)
MCV: 86.9 fl (ref 78.0–100.0)
MONO ABS: 0.8 10*3/uL (ref 0.1–1.0)
Monocytes Relative: 13.1 % — ABNORMAL HIGH (ref 3.0–12.0)
NEUTROS ABS: 3.1 10*3/uL (ref 1.4–7.7)
NEUTROS PCT: 48.9 % (ref 43.0–77.0)
PLATELETS: 308 10*3/uL (ref 150.0–400.0)
RBC: 4.15 Mil/uL (ref 3.87–5.11)
RDW: 13.8 % (ref 11.5–15.5)
WBC: 6.4 10*3/uL (ref 4.0–10.5)

## 2016-10-10 LAB — HEMOGLOBIN A1C: Hgb A1c MFr Bld: 8.9 % — ABNORMAL HIGH (ref 4.6–6.5)

## 2016-10-10 LAB — MICROALBUMIN / CREATININE URINE RATIO
CREATININE, U: 101.1 mg/dL
MICROALB/CREAT RATIO: 2.7 mg/g (ref 0.0–30.0)
Microalb, Ur: 2.7 mg/dL — ABNORMAL HIGH (ref 0.0–1.9)

## 2016-10-10 LAB — TSH: TSH: 2.58 u[IU]/mL (ref 0.35–4.50)

## 2016-10-11 ENCOUNTER — Other Ambulatory Visit: Payer: Self-pay | Admitting: Internal Medicine

## 2016-10-11 MED ORDER — NITROFURANTOIN MONOHYD MACRO 100 MG PO CAPS: 100.0000 mg | ORAL_CAPSULE | Freq: Two times a day (BID) | ORAL | 0 refills | Status: DC

## 2016-10-20 ENCOUNTER — Other Ambulatory Visit: Payer: Self-pay | Admitting: Internal Medicine

## 2016-10-20 ENCOUNTER — Ambulatory Visit: Payer: Medicare Other | Admitting: Podiatry

## 2016-10-25 ENCOUNTER — Other Ambulatory Visit: Payer: Self-pay | Admitting: Internal Medicine

## 2016-11-08 DIAGNOSIS — E109 Type 1 diabetes mellitus without complications: Secondary | ICD-10-CM | POA: Diagnosis not present

## 2016-11-12 ENCOUNTER — Other Ambulatory Visit: Payer: Self-pay | Admitting: Internal Medicine

## 2016-11-13 ENCOUNTER — Other Ambulatory Visit: Payer: Self-pay | Admitting: Endocrinology

## 2016-11-15 ENCOUNTER — Ambulatory Visit (INDEPENDENT_AMBULATORY_CARE_PROVIDER_SITE_OTHER): Payer: Medicare Other | Admitting: Internal Medicine

## 2016-11-15 ENCOUNTER — Encounter: Payer: Self-pay | Admitting: Internal Medicine

## 2016-11-15 VITALS — BP 142/70 | HR 77 | Temp 98.6°F | Ht 63.0 in | Wt 225.0 lb

## 2016-11-15 DIAGNOSIS — I10 Essential (primary) hypertension: Secondary | ICD-10-CM

## 2016-11-15 DIAGNOSIS — Z794 Long term (current) use of insulin: Secondary | ICD-10-CM | POA: Diagnosis not present

## 2016-11-15 DIAGNOSIS — E11319 Type 2 diabetes mellitus with unspecified diabetic retinopathy without macular edema: Secondary | ICD-10-CM | POA: Diagnosis not present

## 2016-11-15 DIAGNOSIS — N183 Chronic kidney disease, stage 3 unspecified: Secondary | ICD-10-CM

## 2016-11-15 DIAGNOSIS — E785 Hyperlipidemia, unspecified: Secondary | ICD-10-CM

## 2016-11-15 DIAGNOSIS — Z0000 Encounter for general adult medical examination without abnormal findings: Secondary | ICD-10-CM | POA: Diagnosis not present

## 2016-11-15 MED ORDER — VALSARTAN-HYDROCHLOROTHIAZIDE 320-12.5 MG PO TABS: 1.0000 | ORAL_TABLET | Freq: Every day | ORAL | 3 refills | Status: DC

## 2016-11-15 MED ORDER — ATORVASTATIN CALCIUM 40 MG PO TABS: 40.0000 mg | ORAL_TABLET | Freq: Every day | ORAL | 3 refills | Status: DC

## 2016-11-15 MED ORDER — ALLOPURINOL 300 MG PO TABS: 300.0000 mg | ORAL_TABLET | Freq: Two times a day (BID) | ORAL | 3 refills | Status: DC

## 2016-11-15 NOTE — Assessment & Plan Note (Signed)
Mild persistent uncontrolled, for change diovan 160/12.5 to 320/12.5 qd, f/u next visit

## 2016-11-15 NOTE — Assessment & Plan Note (Signed)
Encourage pt to take lipitor daily,  to f/u any worsening symptoms or concerns

## 2016-11-15 NOTE — Assessment & Plan Note (Signed)
stable overall by history and exam, recent data reviewed with pt, and pt to continue medical treatment as before,  to f/u any worsening symptoms or concerns Lab Results  Component Value Date   CREATININE 1.22 (H) 10/10/2016

## 2016-11-15 NOTE — Assessment & Plan Note (Signed)
suboptimal control and pt compliance with meds, diet and activity are limiting factors, cont endo f/u

## 2016-11-15 NOTE — Assessment & Plan Note (Signed)

## 2016-11-15 NOTE — Progress Notes (Signed)
Subjective:    Patient ID: Gail Lynch, female    DOB: 18-Jun-1941, 76 y.o.   MRN: [id number]  HPI  Here for wellness and f/u;  Overall doing ok;  Pt denies Chest pain, worsening SOB, DOE, wheezing, orthopnea, PND, worsening LE edema, palpitations, dizziness or syncope.  Pt denies neurological change such as new headache, facial or extremity weakness.  Pt denies polydipsia, polyuria, or low sugar symptoms. Pt states overall good compliance with treatment and medications, good tolerability, and has been trying to follow appropriate diet.  Pt denies worsening depressive symptoms, suicidal ideation or panic. No fever, night sweats, wt loss, loss of appetite, or other constitutional symptoms.  Pt states good ability with ADL's, has low fall risk, home safety reviewed and adequate, no other significant changes in hearing or vision, and only occasionally active with exercise. Sees endo for DM.  Lab Results  Component Value Date   HGBA1C 8.9 (H) 10/10/2016  Has some neuropathic tingling and discomfort, has also hx of callous, sees podiatry ever 3 mo.   Last optho in Dec, and wanted her to try a med, but she deferred and since read about it that it might cause stroke or heart disease, so does not think she will return to get started on the med.  Admits to not taking her statin every day (or much at all recenlty, but willing to restart).  BP has been consistently elevated mild, states good compliance with BP med. Has some microalbuminuria on labs feb 2018  Denies urinary symptoms such as dysuria, frequency, urgency, flank pain, hematuria or n/v, fever, chills.  Did tale the antibiotic feb 2018 for abnormal UA BP Readings from Last 3 Encounters:  11/15/16 (!) 142/70  09/05/16 (!) 148/84  06/05/16 (!) 134/58   Past Medical History:  Diagnosis Date  . Arthritis    gout in feet  . Asthma    hx as a child - no inhaler, no problems  . Carotid stenosis 06/18/2012  . Colon polyps   . Diabetes mellitus    Type 2  . Dysplasia of cervix, low grade (CIN 1)    s/p cervical conization  . Glaucoma   . Glaucoma 12/18/2011  . Hard of hearing    hear aids - left  . HPV (human papilloma virus) infection   . Hyperlipidemia   . Hypertension   . Intrinsic asthma, unspecified 12/18/2011  . Mycotic toenails 02/19/2013  . PONV (postoperative nausea and vomiting)   . SVD (spontaneous vaginal delivery)    x 4   Past Surgical History:  Procedure Laterality Date  . BREAST BIOPSY  2010  . BREAST BIOPSY  1991  . COLONOSCOPY    . DILATION AND CURETTAGE OF UTERUS  2000  . HYSTEROSCOPY W/D&C N/A 03/04/2014   Procedure: DILATATION AND CURETTAGE /HYSTEROSCOPY;  Surgeon: Lavonia Drafts, MD;  Location: [facility];  Service: Gynecology;  Laterality: N/A;  . KNEE SURGERY  2010   patella fracture  . MULTIPLE TOOTH EXTRACTIONS    . POLYPECTOMY      reports that she quit smoking about 36 years ago. Her smoking use included Cigarettes. She has quit using smokeless tobacco. She reports that she does not drink alcohol or use drugs. family history includes Diabetes in her brother; Heart disease in her brother, father, and mother. Allergies  Allergen Reactions  . Aspirin Other (See Comments)    BLEEDING BEHIND EYES  . Penicillins    Current Outpatient Prescriptions on File Prior to Visit  Medication Sig Dispense Refill  . acetaminophen (TYLENOL) 500 MG tablet Take 1,000 mg by mouth every 6 (six) hours as needed.    Marland Kitchen allopurinol (ZYLOPRIM) 300 MG tablet Take 1 tablet (300 mg total) by mouth 2 (two) times daily. Must keep 11/15/16 appt for future refills 40 tablet 0  . atorvastatin (LIPITOR) 40 MG tablet TAKE 1 TABLET BY MOUTH DAILY 90 tablet 3  . Cholecalciferol (VITAMIN D3) 1000 UNITS CAPS Take 1 capsule by mouth daily.    . fish oil-omega-3 fatty acids 1000 MG capsule Take 2 g by mouth daily.    . Flaxseed, Linseed, (FLAX SEED OIL) 1000 MG CAPS Take 2 capsules by mouth daily.     . Garlic 7408 MG CAPS Take 1  capsule by mouth daily.    Marland Kitchen glucose blood (ONE TOUCH ULTRA TEST) test strip 1 each by Other route 2 (two) times daily. And lancets 2/day 250.01 180 each 3  . Insulin Syringe-Needle U-100 (INSULIN SYRINGE 1CC/31GX5/16") 31G X 5/16" 1 ML MISC USE ONCE DAILY 100 each 0  . LANTUS 100 UNIT/ML injection INJECT 80 UNITS INTO THE SKIN EVERY MORNING 30 mL 0  . Misc Natural Products (COLON CLEANSER PO) Take by mouth.    . nitrofurantoin, macrocrystal-monohydrate, (MACROBID) 100 MG capsule Take 1 capsule (100 mg total) by mouth 2 (two) times daily. 20 capsule 0  . polyethylene glycol (MIRALAX / GLYCOLAX) packet Take 17 g by mouth daily as needed.     . pyridoxine (CVS VITAMIN B-6) 200 MG tablet Take 200 mg by mouth daily.    . valsartan-hydrochlorothiazide (DIOVAN-HCT) 160-12.5 MG tablet Take 1 tablet by mouth daily. Yearly physical is due must see MD for refills 30 tablet 0  . vitamin B-12 (CYANOCOBALAMIN) 500 MCG tablet Take 500 mcg by mouth daily.      No current facility-administered medications on file prior to visit.    Review of Systems Constitutional: Negative for increased diaphoresis, or other activity, appetite or siginficant weight change other than noted HENT: Negative for worsening hearing loss, ear pain, facial swelling, mouth sores and neck stiffness.   Eyes: Negative for other worsening pain, redness or visual disturbance.  Respiratory: Negative for choking or stridor Cardiovascular: Negative for other chest pain and palpitations.  Gastrointestinal: Negative for worsening diarrhea, blood in stool, or abdominal distention Genitourinary: Negative for hematuria, flank pain or change in urine volume.  Musculoskeletal: Negative for myalgias or other joint complaints.  Skin: Negative for other color change and wound or drainage.  Neurological: Negative for syncope and numbness. other than noted Hematological: Negative for adenopathy. or other swelling Psychiatric/Behavioral: Negative for  hallucinations, SI, self-injury, decreased concentration or other worsening agitation.  All other system neg per pt    Objective:   Physical Exam BP (!) 142/70   Pulse 77   Temp 98.6 F (37 C) (Oral)   Ht 5\' 3"  (1.6 m)   Wt 225 lb (102.1 kg)   SpO2 99%   BMI 39.86 kg/m  VS noted, morbid obese Constitutional: Pt is oriented to person, place, and time. Appears well-developed and well-nourished, in no significant distress Head: Normocephalic and atraumatic  Eyes: Conjunctivae and EOM are normal. Pupils are equal, round, and reactive to light Right Ear: External ear normal.  Left Ear: External ear normal Nose: Nose normal.  Mouth/Throat: Oropharynx is clear and moist  Neck: Normal range of motion. Neck supple. No JVD present. No tracheal deviation present or significant neck LA or mass Cardiovascular: Normal rate,  regular rhythm, normal heart sounds and intact distal pulses.   Pulmonary/Chest: Effort normal and breath sounds without rales or wheezing  Abdominal: Soft. Bowel sounds are normal. NT. No HSM  Musculoskeletal: Normal range of motion. Exhibits trace bilat LE leg edema Lymphadenopathy: Has no cervical adenopathy.  Neurological: Pt is alert and oriented to person, place, and time. Pt has normal reflexes. No cranial nerve deficit. Motor grossly intact Skin: Skin is warm and dry. No rash noted or new ulcers Psychiatric:  Has normal mood and affect. Behavior is normal.  No other exam findings    Assessment & Plan:

## 2016-11-15 NOTE — Patient Instructions (Signed)
OK to stop the diovan HCT 160/12.5 mg  Please take all new medication as prescribed - the Diovan HCT 320/12.5 mg  - 1 per day  Please take all of your medications as prescribed  Please continue all other medications as before, and refills have been done if requested.  Please have the pharmacy call with any other refills you may need.  Please continue your efforts at being more active, low cholesterol diet, and weight control.  You are otherwise up to date with prevention measures today.  Please keep your appointments with your specialists as you may have planned  Please return in 6 months, or sooner if needed, with Lab testing done 3-5 days before

## 2016-11-15 NOTE — Progress Notes (Signed)
Pre visit review using our clinic review tool, if applicable. No additional management support is needed unless otherwise documented below in the visit note. 

## 2016-11-17 ENCOUNTER — Other Ambulatory Visit: Payer: Self-pay | Admitting: Internal Medicine

## 2016-12-05 DIAGNOSIS — Z961 Presence of intraocular lens: Secondary | ICD-10-CM | POA: Diagnosis not present

## 2016-12-05 DIAGNOSIS — H35033 Hypertensive retinopathy, bilateral: Secondary | ICD-10-CM | POA: Diagnosis not present

## 2016-12-05 DIAGNOSIS — E113313 Type 2 diabetes mellitus with moderate nonproliferative diabetic retinopathy with macular edema, bilateral: Secondary | ICD-10-CM | POA: Diagnosis not present

## 2016-12-17 ENCOUNTER — Other Ambulatory Visit: Payer: Self-pay | Admitting: Endocrinology

## 2016-12-27 ENCOUNTER — Ambulatory Visit: Payer: Medicare Other | Admitting: Podiatry

## 2017-01-03 ENCOUNTER — Ambulatory Visit: Payer: Medicare Other | Admitting: Endocrinology

## 2017-01-16 ENCOUNTER — Other Ambulatory Visit: Payer: Self-pay | Admitting: Endocrinology

## 2017-01-17 ENCOUNTER — Other Ambulatory Visit: Payer: Self-pay

## 2017-01-17 MED ORDER — INSULIN GLARGINE 100 UNIT/ML ~~LOC~~ SOLN: SUBCUTANEOUS | 1 refills | Status: DC

## 2017-01-24 ENCOUNTER — Ambulatory Visit (INDEPENDENT_AMBULATORY_CARE_PROVIDER_SITE_OTHER): Payer: Medicare Other | Admitting: Endocrinology

## 2017-01-24 ENCOUNTER — Encounter: Payer: Self-pay | Admitting: Endocrinology

## 2017-01-24 VITALS — BP 124/60 | HR 80 | Ht 63.0 in | Wt 223.0 lb

## 2017-01-24 DIAGNOSIS — Z794 Long term (current) use of insulin: Secondary | ICD-10-CM

## 2017-01-24 DIAGNOSIS — E11319 Type 2 diabetes mellitus with unspecified diabetic retinopathy without macular edema: Secondary | ICD-10-CM | POA: Diagnosis not present

## 2017-01-24 LAB — POCT GLYCOSYLATED HEMOGLOBIN (HGB A1C): HEMOGLOBIN A1C: 8.3

## 2017-01-24 MED ORDER — INSULIN NPH (HUMAN) (ISOPHANE) 100 UNIT/ML ~~LOC~~ SUSP: 60.0000 [IU] | SUBCUTANEOUS | 11 refills | Status: DC

## 2017-01-24 NOTE — Patient Instructions (Addendum)
check your blood sugar twice a day.  vary the time of day when you check, between before the 3 meals, and at bedtime.  also check if you have symptoms of your blood sugar being too high or too low.  please keep a record of the readings and bring it to your next appointment here.  please call us sooner if your blood sugar goes below 70, or if it stays over 200.  As you write it out, please also write down why it might be higher or lower on a certain day.   Please change the lantus to NPH, 60 units every morning, no matter what your blood sugar is.   Please call us next week, to tell us how the blood sugar is doing.   On this type of insulin schedule, you should eat meals on a regular schedule (especially lunch).  If a meal is missed or significantly delayed, your blood sugar could go low.  Please come back for a follow-up appointment in 3 months.

## 2017-01-24 NOTE — Progress Notes (Signed)
Subjective:    Patient ID: Gail Lynch, female    DOB: 08-16-1941, 76 y.o.   MRN: [id number]  HPI Pt returns for f/u of diabetes mellitus: DM type: Insulin-requiring type 2. Dx'ed: 9983 Complications: polyneuropathy and retinopathy.   Therapy: insulin since soon after dx.   GDM: never. DKA: never Severe hypoglycemia: never.   Pancreatitis: never.   Other: she declines multiple daily injections; she declines to increase frequency of injections; she cannot have weight-loss surgery, due to having medicaid.   Interval history:  she brings a record of her cbg's which I have reviewed today.  It varies from 65-300's.  It is in general higher as the day goes on.  pt states she feels well in general.  She takes lantus, 70-75 units qam.   Past Medical History:  Diagnosis Date  . Arthritis    gout in feet  . Asthma    hx as a child - no inhaler, no problems  . Carotid stenosis 06/18/2012  . Colon polyps   . Diabetes mellitus    Type 2  . Dysplasia of cervix, low grade (CIN 1)    s/p cervical conization  . Glaucoma   . Glaucoma 12/18/2011  . Hard of hearing    hear aids - left  . HPV (human papilloma virus) infection   . Hyperlipidemia   . Hypertension   . Intrinsic asthma, unspecified 12/18/2011  . Mycotic toenails 02/19/2013  . PONV (postoperative nausea and vomiting)   . SVD (spontaneous vaginal delivery)    x 4    Past Surgical History:  Procedure Laterality Date  . BREAST BIOPSY  2010  . BREAST BIOPSY  1991  . COLONOSCOPY    . DILATION AND CURETTAGE OF UTERUS  2000  . HYSTEROSCOPY W/D&C N/A 03/04/2014   Procedure: DILATATION AND CURETTAGE /HYSTEROSCOPY;  Surgeon: Lavonia Drafts, MD;  Location: [facility];  Service: Gynecology;  Laterality: N/A;  . KNEE SURGERY  2010   patella fracture  . MULTIPLE TOOTH EXTRACTIONS    . POLYPECTOMY      Social History   Social History  . Marital status: Widowed    Spouse name: N/A  . Number of children: 3  . Years of  education: N/A   Occupational History  . retired    Social History Main Topics  . Smoking status: Former Smoker    Types: Cigarettes    Quit date: 08/21/1980  . Smokeless tobacco: Former Systems developer  . Alcohol use No  . Drug use: No  . Sexual activity: Yes    Birth control/ protection: Post-menopausal   Other Topics Concern  . Not on file   Social History Narrative  . No narrative on file    Current Outpatient Prescriptions on File Prior to Visit  Medication Sig Dispense Refill  . acetaminophen (TYLENOL) 500 MG tablet Take 1,000 mg by mouth every 6 (six) hours as needed.    Marland Kitchen allopurinol (ZYLOPRIM) 300 MG tablet Take 1 tablet (300 mg total) by mouth 2 (two) times daily. Must keep 11/15/16 appt for future refills 90 tablet 3  . atorvastatin (LIPITOR) 40 MG tablet Take 1 tablet (40 mg total) by mouth daily. 90 tablet 3  . Cholecalciferol (VITAMIN D3) 1000 UNITS CAPS Take 1 capsule by mouth daily.    . fish oil-omega-3 fatty acids 1000 MG capsule Take 2 g by mouth daily.    . Flaxseed, Linseed, (FLAX SEED OIL) 1000 MG CAPS Take 2 capsules by mouth daily.     Marland Kitchen  Garlic 4315 MG CAPS Take 1 capsule by mouth daily.    Marland Kitchen glucose blood (ONE TOUCH ULTRA TEST) test strip 1 each by Other route 2 (two) times daily. And lancets 2/day 250.01 180 each 3  . Insulin Syringe-Needle U-100 (INSULIN SYRINGE 1CC/31GX5/16") 31G X 5/16" 1 ML MISC USE ONCE DAILY 100 each 0  . Misc Natural Products (COLON CLEANSER PO) Take by mouth.    . polyethylene glycol (MIRALAX / GLYCOLAX) packet Take 17 g by mouth daily as needed.     . pyridoxine (CVS VITAMIN B-6) 200 MG tablet Take 200 mg by mouth daily.    . valsartan-hydrochlorothiazide (DIOVAN HCT) 320-12.5 MG tablet Take 1 tablet by mouth daily. 90 tablet 3  . vitamin B-12 (CYANOCOBALAMIN) 500 MCG tablet Take 500 mcg by mouth daily.      No current facility-administered medications on file prior to visit.     Allergies  Allergen Reactions  . Aspirin Other (See  Comments)    BLEEDING BEHIND EYES  . Penicillins     Family History  Problem Relation Age of Onset  . Diabetes Brother   . Heart disease Brother   . Heart disease Mother   . Heart disease Father   . Asthma Unknown   . Colon cancer Neg Hx   . Rectal cancer Neg Hx   . Stomach cancer Neg Hx   . Esophageal cancer Neg Hx     BP 124/60   Pulse 80   Ht 5\' 3"  (1.6 m)   Wt 223 lb (101.2 kg)   SpO2 93%   BMI 39.50 kg/m    Review of Systems Denies LOC    Objective:   Physical Exam VITAL SIGNS:  See vs page.  GENERAL: no distress. Pulses: dorsalis pedis intact bilat.   MSK: no deformity of the feet.  CV: trace bilat leg edema.   Skin:  no ulcer on the feet.  normal color and temp on the feet.  There are hyperpigmanted callused at the plantar aspects of both feet.    Neuro: sensation is intact to touch on the feet.   A1c=8.3%    Assessment & Plan:  Insulin-requiring type 2 DM, with DR: based on the pattern of her cbg's, she needs a faster-acting QD insulin.   Patient Instructions  check your blood sugar twice a day.  vary the time of day when you check, between before the 3 meals, and at bedtime.  also check if you have symptoms of your blood sugar being too high or too low.  please keep a record of the readings and bring it to your next appointment here.  please call us sooner if your blood sugar goes below 70, or if it stays over 200.  As you write it out, please also write down why it might be higher or lower on a certain day.   Please change the lantus to NPH, 60 units every morning, no matter what your blood sugar is.   Please call us next week, to tell us how the blood sugar is doing.   On this type of insulin schedule, you should eat meals on a regular schedule (especially lunch).  If a meal is missed or significantly delayed, your blood sugar could go low.  Please come back for a follow-up appointment in 3 months.

## 2017-02-02 ENCOUNTER — Ambulatory Visit (INDEPENDENT_AMBULATORY_CARE_PROVIDER_SITE_OTHER): Payer: Medicare Other | Admitting: Podiatry

## 2017-02-02 ENCOUNTER — Encounter: Payer: Self-pay | Admitting: Podiatry

## 2017-02-02 DIAGNOSIS — B351 Tinea unguium: Secondary | ICD-10-CM | POA: Diagnosis not present

## 2017-02-02 DIAGNOSIS — M79676 Pain in unspecified toe(s): Secondary | ICD-10-CM | POA: Diagnosis not present

## 2017-02-02 DIAGNOSIS — E1151 Type 2 diabetes mellitus with diabetic peripheral angiopathy without gangrene: Secondary | ICD-10-CM

## 2017-02-02 NOTE — Progress Notes (Signed)
Patient ID: Gail Lynch, female   DOB: 02/12/1941, 76 y.o.   MRN: [id number] Complaint:  Visit Type: Patient returns to my office for continued preventative foot care services. Complaint: Patient states" my nails have grown long and thick and become painful to walk and wear shoes" Patient has been diagnosed with DM with neuropathy and angiopathy.. The patient presents for preventative foot care services. No changes to ROS  Podiatric Exam: Vascular: dorsalis pedis and posterior tibial pulses are not  palpable bilateral. Capillary return is immediate. Temperature gradient is WNL. Skin turgor WNL  Sensorium: Normal Semmes Weinstein monofilament test. Normal tactile sensation bilaterally. Nail Exam: Pt has thick disfigured discolored nails with subungual debris noted bilateral entire nail hallux through fifth toenails Ulcer Exam: There is no evidence of ulcer or pre-ulcerative changes or infection. Orthopedic Exam: Muscle tone and strength are WNL. No limitations in general ROM. No crepitus or effusions noted. Foot type and digits show no abnormalities. Bony prominences are unremarkable. Pes planus Skin: No Porokeratosis. No infection or ulcers.  Callus right heel,  Asymptomatic..  Diagnosis:  Onychomycosis, , Pain in right toe, pain in left toes  Treatment & Plan Procedures and Treatment: Consent by patient was obtained for treatment procedures. The patient understood the discussion of treatment and procedures well. All questions were answered thoroughly reviewed. Debridement of mycotic and hypertrophic toenails, 1 through 5 bilateral and clearing of subungual debris. No ulceration, no infection noted.  Return Visit-Office Procedure: Patient instructed to return to the office for a follow up visit 3 months for continued evaluation and treatment.   Gail Lynch DPM    

## 2017-02-14 DIAGNOSIS — E109 Type 1 diabetes mellitus without complications: Secondary | ICD-10-CM | POA: Diagnosis not present

## 2017-03-12 ENCOUNTER — Telehealth: Payer: Self-pay | Admitting: Internal Medicine

## 2017-03-12 NOTE — Telephone Encounter (Signed)
Patient has called in stating that her legs are swelling and she is having difficulty lifting her arms.  Patient believes its due to her not taking her losartan.  Patient states she has stopped taking this medication since the recall.  Patient states she would like to go back on the medication she was on before losartan.  I have scheduled patient to be seen this Wed by Dr. Jenny Reichmann.  If patient does not need appt please follow up in regard.

## 2017-03-14 ENCOUNTER — Ambulatory Visit (INDEPENDENT_AMBULATORY_CARE_PROVIDER_SITE_OTHER): Payer: Medicare Other | Admitting: Internal Medicine

## 2017-03-14 ENCOUNTER — Encounter: Payer: Self-pay | Admitting: Internal Medicine

## 2017-03-14 DIAGNOSIS — I10 Essential (primary) hypertension: Secondary | ICD-10-CM | POA: Diagnosis not present

## 2017-03-14 DIAGNOSIS — R609 Edema, unspecified: Secondary | ICD-10-CM

## 2017-03-14 DIAGNOSIS — R29898 Other symptoms and signs involving the musculoskeletal system: Secondary | ICD-10-CM | POA: Diagnosis not present

## 2017-03-14 DIAGNOSIS — I350 Nonrheumatic aortic (valve) stenosis: Secondary | ICD-10-CM | POA: Diagnosis not present

## 2017-03-14 DIAGNOSIS — R600 Localized edema: Secondary | ICD-10-CM | POA: Insufficient documentation

## 2017-03-14 DIAGNOSIS — N183 Chronic kidney disease, stage 3 unspecified: Secondary | ICD-10-CM

## 2017-03-14 HISTORY — DX: Nonrheumatic aortic (valve) stenosis: I35.0

## 2017-03-14 MED ORDER — VALSARTAN-HYDROCHLOROTHIAZIDE 320-25 MG PO TABS: 1.0000 | ORAL_TABLET | Freq: Every day | ORAL | 3 refills | Status: DC

## 2017-03-14 NOTE — Progress Notes (Signed)
Subjective:    Patient ID: Gail Lynch, female    DOB: 1941/04/18, 76 y.o.   MRN: [id number]  HPI  Here to f/u, Pt denies chest pain, increased sob or doe, wheezing, orthopnea, PND, palpitations, dizziness or syncope, but has mild worsening bilat leg swelling below the knees which is starting to become uncomfortable. Not pushing oral fluids.  Good compliance with current meds which includes the hct 12.5 in the diovanHCT. No hx of pulm HTn or recent worsening hepatorenal dz.  Does have hx of mild DD by echo 22016 with normal EF, also mild AS with murmur but asymptomatic.  Also mentions a vague kiind of 2 wks intermitent mild prox arm "heaviness" unusual for her, but only last a few minutes at at time, usually both arms, without neck pain or other TIA symptoms such as HA, drooling, facial or extremity numbness or weakness.  No heaviness today Past Medical History:  Diagnosis Date  . Arthritis    gout in feet  . Asthma    hx as a child - no inhaler, no problems  . Carotid stenosis 06/18/2012  . Colon polyps   . Diabetes mellitus    Type 2  . Dysplasia of cervix, low grade (CIN 1)    s/p cervical conization  . Glaucoma   . Glaucoma 12/18/2011  . Hard of hearing    hear aids - left  . HPV (human papilloma virus) infection   . Hyperlipidemia   . Hypertension   . Intrinsic asthma, unspecified 12/18/2011  . Mild aortic stenosis 03/14/2017  . Mycotic toenails 02/19/2013  . PONV (postoperative nausea and vomiting)   . SVD (spontaneous vaginal delivery)    x 4   Past Surgical History:  Procedure Laterality Date  . BREAST BIOPSY  2010  . BREAST BIOPSY  1991  . COLONOSCOPY    . DILATION AND CURETTAGE OF UTERUS  2000  . HYSTEROSCOPY W/D&C N/A 03/04/2014   Procedure: DILATATION AND CURETTAGE /HYSTEROSCOPY;  Surgeon: Lavonia Drafts, MD;  Location: [facility];  Service: Gynecology;  Laterality: N/A;  . KNEE SURGERY  2010   patella fracture  . MULTIPLE TOOTH EXTRACTIONS    . POLYPECTOMY        reports that she quit smoking about 36 years ago. Her smoking use included Cigarettes. She has quit using smokeless tobacco. She reports that she does not drink alcohol or use drugs. family history includes Asthma in her unknown relative; Diabetes in her brother; Heart disease in her brother, father, and mother. Allergies  Allergen Reactions  . Aspirin Other (See Comments)    BLEEDING BEHIND EYES  . Penicillins    Current Outpatient Prescriptions on File Prior to Visit  Medication Sig Dispense Refill  . acetaminophen (TYLENOL) 500 MG tablet Take 1,000 mg by mouth every 6 (six) hours as needed.    Marland Kitchen allopurinol (ZYLOPRIM) 300 MG tablet Take 1 tablet (300 mg total) by mouth 2 (two) times daily. Must keep 11/15/16 appt for future refills 90 tablet 3  . atorvastatin (LIPITOR) 40 MG tablet Take 1 tablet (40 mg total) by mouth daily. 90 tablet 3  . Cholecalciferol (VITAMIN D3) 1000 UNITS CAPS Take 1 capsule by mouth daily.    . fish oil-omega-3 fatty acids 1000 MG capsule Take 2 g by mouth daily.    . Flaxseed, Linseed, (FLAX SEED OIL) 1000 MG CAPS Take 2 capsules by mouth daily.     . Garlic 2423 MG CAPS Take 1 capsule by mouth daily.    Marland Kitchen  glucose blood (ONE TOUCH ULTRA TEST) test strip 1 each by Other route 2 (two) times daily. And lancets 2/day 250.01 180 each 3  . insulin NPH Human (HUMULIN N) 100 UNIT/ML injection Inject 0.6 mLs (60 Units total) into the skin every morning. And syringes 1/day 20 mL 11  . Insulin Syringe-Needle U-100 (INSULIN SYRINGE 1CC/31GX5/16") 31G X 5/16" 1 ML MISC USE ONCE DAILY 100 each 0  . Misc Natural Products (COLON CLEANSER PO) Take by mouth.    . polyethylene glycol (MIRALAX / GLYCOLAX) packet Take 17 g by mouth daily as needed.     . pyridoxine (CVS VITAMIN B-6) 200 MG tablet Take 200 mg by mouth daily.    . vitamin B-12 (CYANOCOBALAMIN) 500 MCG tablet Take 500 mcg by mouth daily.      No current facility-administered medications on file prior to visit.     Review of Systems  Constitutional: Negative for other unusual diaphoresis or sweats HENT: Negative for ear discharge or swelling Eyes: Negative for other worsening visual disturbances Respiratory: Negative for stridor or other swelling  Gastrointestinal: Negative for worsening distension or other blood Genitourinary: Negative for retention or other urinary change Musculoskeletal: Negative for other MSK pain or swelling Skin: Negative for color change or other new lesions Neurological: Negative for worsening tremors and other numbness  Psychiatric/Behavioral: Negative for worsening agitation or other fatigue All other system neg per pt    Objective:   Physical Exam BP (!) 146/84   Pulse 75   Ht 5\' 3"  (1.6 m)   Wt 224 lb (101.6 kg)   SpO2 100%   BMI 39.68 kg/m  VS noted,  Constitutional: Pt appears in NAD HENT: Head: NCAT.  Right Ear: External ear normal.  Left Ear: External ear normal.  Eyes: . Pupils are equal, round, and reactive to light. Conjunctivae and EOM are normal Nose: without d/c or deformity Neck: Neck supple. Gross normal ROM Cardiovascular: Normal rate and regular rhythm   gr 2/6 Syst murmur RU Pulmonary/Chest: Effort normal and breath sounds without rales or wheezing.  Neurological: Pt is alert. At baseline orientation, motor grossly intact Skin: Skin is warm. No rashes, other new lesions, 1-2+ bilat LE edema to knees without ulcer or weeping, no erythema Psychiatric: Pt behavior is normal without agitation  No other exam findings Lab Results  Component Value Date   WBC 6.4 10/10/2016   HGB 11.7 (L) 10/10/2016   HCT 36.1 10/10/2016   PLT 308.0 10/10/2016   GLUCOSE 80 10/10/2016   CHOL 204 (H) 10/10/2016   TRIG 145.0 10/10/2016   HDL 34.20 (L) 10/10/2016   LDLDIRECT 152.9 12/18/2011   LDLCALC 141 (H) 10/10/2016   ALT 12 10/10/2016   AST 15 10/10/2016   NA 143 10/10/2016   K 4.6 10/10/2016   CL 109 10/10/2016   CREATININE 1.22 (H) 10/10/2016   BUN  19 10/10/2016   CO2 28 10/10/2016   TSH 2.58 10/10/2016   INR 1.1 (H) 09/08/2013   HGBA1C 8.3 01/24/2017   MICROALBUR 2.7 (H) 10/10/2016      Assessment & Plan:

## 2017-03-14 NOTE — Assessment & Plan Note (Signed)
stable overall by history and exam, recent data reviewed with pt, and pt to continue medical treatment as before,  to f/u any worsening symptoms or concerns Lab Results  Component Value Date   CREATININE 1.22 (H) 10/10/2016

## 2017-03-14 NOTE — Assessment & Plan Note (Signed)
etilogy unclear, maybe someelement of venous insufficiency, but cant r/o other, will hold on repeat echo or repeat labs but will increase the HCT with her regimen, cont to monitor daily wts, and call for any increased swelling and wt gain > 5 lbs to consider change of the hct to lasix

## 2017-03-14 NOTE — Assessment & Plan Note (Signed)
Exam benign today, and not o/w suggestive of CNS, neck or shoulder or arm problem, will cont to follow

## 2017-03-14 NOTE — Assessment & Plan Note (Signed)
asympt without sob or dizziness, The current medical regimen is effective;  continue present plan and medications.

## 2017-03-14 NOTE — Patient Instructions (Signed)
Ok to STOP the diovan HCT 320/12.5 mg  (the same thing as valsartan - hydrochlorothiazide  Please take all new medication as prescribed - the Diovan HCT 320/25 mg (which is the same thing as valsartan -Hydrochlorothiazide)  Please continue all other medications as before, and refills have been done if requested.  Please have the pharmacy call with any other refills you may need.  Please keep your appointments with your specialists as you may have planned

## 2017-03-14 NOTE — Assessment & Plan Note (Signed)
Mild worsening today, ok to increase the diovan HCT to 320/25 qd, f/u next visit

## 2017-03-29 ENCOUNTER — Other Ambulatory Visit: Payer: Self-pay | Admitting: Endocrinology

## 2017-04-26 ENCOUNTER — Encounter: Payer: Self-pay | Admitting: Endocrinology

## 2017-04-26 ENCOUNTER — Ambulatory Visit (INDEPENDENT_AMBULATORY_CARE_PROVIDER_SITE_OTHER): Payer: Medicare Other | Admitting: Endocrinology

## 2017-04-26 VITALS — BP 136/68 | HR 77 | Wt 223.6 lb

## 2017-04-26 DIAGNOSIS — Z794 Long term (current) use of insulin: Secondary | ICD-10-CM

## 2017-04-26 DIAGNOSIS — E11319 Type 2 diabetes mellitus with unspecified diabetic retinopathy without macular edema: Secondary | ICD-10-CM

## 2017-04-26 LAB — POCT GLYCOSYLATED HEMOGLOBIN (HGB A1C): HEMOGLOBIN A1C: 8.1

## 2017-04-26 NOTE — Patient Instructions (Addendum)
check your blood sugar twice a day.  vary the time of day when you check, between before the 3 meals, and at bedtime.  also check if you have symptoms of your blood sugar being too high or too low.  please keep a record of the readings and bring it to your next appointment here.  please call us sooner if your blood sugar goes below 70, or if it stays over 200.  As you write it out, please also write down why it might be higher or lower on a certain day.   Please continue the same insulin for now On this type of insulin schedule, you should eat meals on a regular schedule (especially lunch).  If a meal is missed or significantly delayed, your blood sugar could go low.  Please come back for a follow-up appointment in 3 months.

## 2017-04-26 NOTE — Progress Notes (Signed)
Subjective:    Patient ID: Gail Lynch, female    DOB: [birth year], 76 y.o.   MRN: [id number]  HPI Pt returns for f/u of diabetes mellitus: DM type: Insulin-requiring type 2. Dx'ed: 0109 Complications: polyneuropathy and retinopathy.   Therapy: insulin since soon after dx.   GDM: never. DKA: never Severe hypoglycemia: never.   Pancreatitis: never.   Other: she declines multiple daily injections; she declines to increase frequency of injections; she cannot have weight-loss surgery, due to having medicaid; she changed from lantus to NPH, due to pattern of cbg's.   Interval history: no cbg record, but states cbg's vary from 74-200's.  It is in general higher as the day goes on.  pt states she feels well in general.  Past Medical History:  Diagnosis Date  . Arthritis    gout in feet  . Asthma    hx as a child - no inhaler, no problems  . Carotid stenosis 06/18/2012  . Colon polyps   . Diabetes mellitus    Type 2  . Dysplasia of cervix, low grade (CIN 1)    s/p cervical conization  . Glaucoma   . Glaucoma 12/18/2011  . Hard of hearing    hear aids - left  . HPV (human papilloma virus) infection   . Hyperlipidemia   . Hypertension   . Intrinsic asthma, unspecified 12/18/2011  . Mild aortic stenosis 03/14/2017  . Mycotic toenails 02/19/2013  . PONV (postoperative nausea and vomiting)   . SVD (spontaneous vaginal delivery)    x 4    Past Surgical History:  Procedure Laterality Date  . BREAST BIOPSY  2010  . BREAST BIOPSY  1991  . COLONOSCOPY    . DILATION AND CURETTAGE OF UTERUS  2000  . HYSTEROSCOPY W/D&C N/A 03/04/2014   Procedure: DILATATION AND CURETTAGE /HYSTEROSCOPY;  Surgeon: Lavonia Drafts, MD;  Location: [facility];  Service: Gynecology;  Laterality: N/A;  . KNEE SURGERY  2010   patella fracture  . MULTIPLE TOOTH EXTRACTIONS    . POLYPECTOMY      Social History   Social History  . Marital status: Widowed    Spouse name: N/A  . Number of children: 3  .  Years of education: N/A   Occupational History  . retired    Social History Main Topics  . Smoking status: Former Smoker    Types: Cigarettes    Quit date: 08/21/1980  . Smokeless tobacco: Former Systems developer  . Alcohol use No  . Drug use: No  . Sexual activity: Yes    Birth control/ protection: Post-menopausal   Other Topics Concern  . Not on file   Social History Narrative  . No narrative on file    Current Outpatient Prescriptions on File Prior to Visit  Medication Sig Dispense Refill  . acetaminophen (TYLENOL) 500 MG tablet Take 1,000 mg by mouth every 6 (six) hours as needed.    Marland Kitchen allopurinol (ZYLOPRIM) 300 MG tablet Take 1 tablet (300 mg total) by mouth 2 (two) times daily. Must keep 11/15/16 appt for future refills 90 tablet 3  . atorvastatin (LIPITOR) 40 MG tablet Take 1 tablet (40 mg total) by mouth daily. 90 tablet 3  . Cholecalciferol (VITAMIN D3) 1000 UNITS CAPS Take 1 capsule by mouth daily.    . fish oil-omega-3 fatty acids 1000 MG capsule Take 2 g by mouth daily.    . Flaxseed, Linseed, (FLAX SEED OIL) 1000 MG CAPS Take 2 capsules by mouth daily.     Marland Kitchen  Garlic 3299 MG CAPS Take 1 capsule by mouth daily.    Marland Kitchen glucose blood (ONE TOUCH ULTRA TEST) test strip 1 each by Other route 2 (two) times daily. And lancets 2/day 250.01 180 each 3  . insulin NPH Human (HUMULIN N) 100 UNIT/ML injection Inject 0.6 mLs (60 Units total) into the skin every morning. And syringes 1/day 20 mL 11  . Insulin Syringe-Needle U-100 (INSULIN SYRINGE 1CC/31GX5/16") 31G X 5/16" 1 ML MISC USE ONCE DAILY 100 each 0  . LANTUS 100 UNIT/ML injection ADMINISTER 80 UNITS UNDER THE SKIN EVERY MORNING 30 mL 0  . Misc Natural Products (COLON CLEANSER PO) Take by mouth.    . polyethylene glycol (MIRALAX / GLYCOLAX) packet Take 17 g by mouth daily as needed.     . pyridoxine (CVS VITAMIN B-6) 200 MG tablet Take 200 mg by mouth daily.    . valsartan-hydrochlorothiazide (DIOVAN-HCT) 320-25 MG tablet Take 1 tablet by  mouth daily. 90 tablet 3  . vitamin B-12 (CYANOCOBALAMIN) 500 MCG tablet Take 500 mcg by mouth daily.      No current facility-administered medications on file prior to visit.     Allergies  Allergen Reactions  . Aspirin Other (See Comments)    BLEEDING BEHIND EYES  . Penicillins     Family History  Problem Relation Age of Onset  . Diabetes Brother   . Heart disease Brother   . Heart disease Mother   . Heart disease Father   . Asthma Unknown   . Colon cancer Neg Hx   . Rectal cancer Neg Hx   . Stomach cancer Neg Hx   . Esophageal cancer Neg Hx     BP 136/68   Pulse 77   Wt 223 lb 9.6 oz (101.4 kg)   SpO2 97%   BMI 39.61 kg/m    Review of Systems She denies hypoglycemia    Objective:   Physical Exam VITAL SIGNS:  See vs page.  GENERAL: no distress. Pulses: foot pulses are intact bilaterally.   MSK: no deformity of the feet or ankles.  CV: 1+ bilat edema of the legs Skin:  no ulcer on the feet or ankles.  normal color and temp on the feet and ankles Neuro: sensation is intact to touch on the feet and ankles.     A1c=8.1%    Assessment & Plan:  Insulin-requiring type 2 DM, with DR: Based on the pattern of her cbg's, she needs a faster-acting qd insulin.  We discussed.  She declines to change to 70/30, at least for now.  Patient Instructions  check your blood sugar twice a day.  vary the time of day when you check, between before the 3 meals, and at bedtime.  also check if you have symptoms of your blood sugar being too high or too low.  please keep a record of the readings and bring it to your next appointment here.  please call us sooner if your blood sugar goes below 70, or if it stays over 200.  As you write it out, please also write down why it might be higher or lower on a certain day.   Please continue the same insulin for now On this type of insulin schedule, you should eat meals on a regular schedule (especially lunch).  If a meal is missed or  significantly delayed, your blood sugar could go low.  Please come back for a follow-up appointment in 3 months.

## 2017-04-28 ENCOUNTER — Other Ambulatory Visit: Payer: Self-pay | Admitting: Endocrinology

## 2017-05-04 ENCOUNTER — Ambulatory Visit: Payer: Medicare Other | Admitting: Podiatry

## 2017-05-17 DIAGNOSIS — E119 Type 2 diabetes mellitus without complications: Secondary | ICD-10-CM | POA: Diagnosis not present

## 2017-05-17 DIAGNOSIS — Z794 Long term (current) use of insulin: Secondary | ICD-10-CM | POA: Diagnosis not present

## 2017-05-18 ENCOUNTER — Ambulatory Visit: Payer: Medicare Other | Admitting: Internal Medicine

## 2017-05-24 ENCOUNTER — Other Ambulatory Visit: Payer: Self-pay | Admitting: Internal Medicine

## 2017-05-24 ENCOUNTER — Encounter: Payer: Self-pay | Admitting: Internal Medicine

## 2017-05-24 ENCOUNTER — Other Ambulatory Visit (INDEPENDENT_AMBULATORY_CARE_PROVIDER_SITE_OTHER): Payer: Medicare Other

## 2017-05-24 ENCOUNTER — Telehealth: Payer: Self-pay

## 2017-05-24 ENCOUNTER — Ambulatory Visit (INDEPENDENT_AMBULATORY_CARE_PROVIDER_SITE_OTHER): Payer: Medicare Other | Admitting: Internal Medicine

## 2017-05-24 VITALS — BP 138/84 | HR 91 | Temp 98.4°F | Ht 63.0 in | Wt 222.0 lb

## 2017-05-24 DIAGNOSIS — N183 Chronic kidney disease, stage 3 unspecified: Secondary | ICD-10-CM

## 2017-05-24 DIAGNOSIS — E785 Hyperlipidemia, unspecified: Secondary | ICD-10-CM

## 2017-05-24 DIAGNOSIS — Z794 Long term (current) use of insulin: Secondary | ICD-10-CM

## 2017-05-24 DIAGNOSIS — E11319 Type 2 diabetes mellitus with unspecified diabetic retinopathy without macular edema: Secondary | ICD-10-CM | POA: Diagnosis not present

## 2017-05-24 DIAGNOSIS — I10 Essential (primary) hypertension: Secondary | ICD-10-CM

## 2017-05-24 LAB — LIPID PANEL
CHOLESTEROL: 193 mg/dL (ref 0–200)
HDL: 32.8 mg/dL — ABNORMAL LOW (ref >39.00)
LDL CALC: 133 mg/dL — AB (ref 0–99)
NonHDL: 160.54
TRIGLYCERIDES: 138 mg/dL (ref 0.0–149.0)
Total CHOL/HDL Ratio: 6
VLDL: 27.6 mg/dL (ref 0.0–40.0)

## 2017-05-24 LAB — CBC WITH DIFFERENTIAL/PLATELET
BASOS ABS: 0 10*3/uL (ref 0.0–0.1)
Basophils Relative: 0.7 % (ref 0.0–3.0)
EOS ABS: 0.2 10*3/uL (ref 0.0–0.7)
Eosinophils Relative: 3.3 % (ref 0.0–5.0)
HCT: 36.7 % (ref 36.0–46.0)
Hemoglobin: 11.7 g/dL — ABNORMAL LOW (ref 12.0–15.0)
Lymphocytes Relative: 29.2 % (ref 12.0–46.0)
Lymphs Abs: 2.1 10*3/uL (ref 0.7–4.0)
MCHC: 32 g/dL (ref 30.0–36.0)
MCV: 88.2 fl (ref 78.0–100.0)
MONO ABS: 0.9 10*3/uL (ref 0.1–1.0)
Monocytes Relative: 12.4 % — ABNORMAL HIGH (ref 3.0–12.0)
Neutro Abs: 3.9 10*3/uL (ref 1.4–7.7)
Neutrophils Relative %: 54.4 % (ref 43.0–77.0)
Platelets: 303 10*3/uL (ref 150.0–400.0)
RBC: 4.16 Mil/uL (ref 3.87–5.11)
RDW: 14.2 % (ref 11.5–15.5)
WBC: 7.1 10*3/uL (ref 4.0–10.5)

## 2017-05-24 LAB — BASIC METABOLIC PANEL
BUN: 16 mg/dL (ref 6–23)
CHLORIDE: 106 meq/L (ref 96–112)
CO2: 26 mEq/L (ref 19–32)
Calcium: 9.4 mg/dL (ref 8.4–10.5)
Creatinine, Ser: 1.28 mg/dL — ABNORMAL HIGH (ref 0.40–1.20)
GFR: 51.82 mL/min — AB (ref >60.00)
GLUCOSE: 177 mg/dL — AB (ref 70–99)
POTASSIUM: 4.2 meq/L (ref 3.5–5.1)
Sodium: 140 mEq/L (ref 135–145)

## 2017-05-24 LAB — HEPATIC FUNCTION PANEL
ALBUMIN: 3.7 g/dL (ref 3.5–5.2)
ALT: 10 U/L (ref 0–35)
AST: 11 U/L (ref 0–37)
Alkaline Phosphatase: 77 U/L (ref 39–117)
Bilirubin, Direct: 0 mg/dL (ref 0.0–0.3)
Total Bilirubin: 0.3 mg/dL (ref 0.2–1.2)
Total Protein: 7 g/dL (ref 6.0–8.3)

## 2017-05-24 MED ORDER — ROSUVASTATIN CALCIUM 10 MG PO TABS: 10.0000 mg | ORAL_TABLET | Freq: Every day | ORAL | 3 refills | Status: DC

## 2017-05-24 NOTE — Progress Notes (Signed)
Subjective:    Patient ID: Gail Lynch, female    DOB: 1940/10/30, 76 y.o.   MRN: [id number]  HPI   Here to f/u; overall doing ok,  Pt denies chest pain, increasing sob or doe, wheezing, orthopnea, PND, increased LE swelling, palpitations, dizziness or syncope.  Pt denies new neurological symptoms such as new headache, or facial or extremity weakness or numbness.  Pt denies polydipsia, polyuria, or low sugar episode.  Pt states overall good compliance with meds, mostly trying to follow appropriate diet, with wt overall stable,  but little exercise however. Seeing endo -  A1c are improving with insulin per endo.  No new complaints or interval hx Past Medical History:  Diagnosis Date  . Arthritis    gout in feet  . Asthma    hx as a child - no inhaler, no problems  . Carotid stenosis 06/18/2012  . Colon polyps   . Diabetes mellitus    Type 2  . Dysplasia of cervix, low grade (CIN 1)    s/p cervical conization  . Glaucoma   . Glaucoma 12/18/2011  . Hard of hearing    hear aids - left  . HPV (human papilloma virus) infection   . Hyperlipidemia   . Hypertension   . Intrinsic asthma, unspecified 12/18/2011  . Mild aortic stenosis 03/14/2017  . Mycotic toenails 02/19/2013  . PONV (postoperative nausea and vomiting)   . SVD (spontaneous vaginal delivery)    x 4   Past Surgical History:  Procedure Laterality Date  . BREAST BIOPSY  2010  . BREAST BIOPSY  1991  . COLONOSCOPY    . DILATION AND CURETTAGE OF UTERUS  2000  . HYSTEROSCOPY W/D&C N/A 03/04/2014   Procedure: DILATATION AND CURETTAGE /HYSTEROSCOPY;  Surgeon: Lavonia Drafts, MD;  Location: [facility];  Service: Gynecology;  Laterality: N/A;  . KNEE SURGERY  2010   patella fracture  . MULTIPLE TOOTH EXTRACTIONS    . POLYPECTOMY      reports that she quit smoking about 36 years ago. Her smoking use included Cigarettes. She has quit using smokeless tobacco. She reports that she does not drink alcohol or use drugs. family  history includes Asthma in her unknown relative; Diabetes in her brother; Heart disease in her brother, father, and mother. Allergies  Allergen Reactions  . Aspirin Other (See Comments)    BLEEDING BEHIND EYES  . Penicillins    Current Outpatient Prescriptions on File Prior to Visit  Medication Sig Dispense Refill  . acetaminophen (TYLENOL) 500 MG tablet Take 1,000 mg by mouth every 6 (six) hours as needed.    Marland Kitchen allopurinol (ZYLOPRIM) 300 MG tablet Take 1 tablet (300 mg total) by mouth 2 (two) times daily. Must keep 11/15/16 appt for future refills 90 tablet 3  . atorvastatin (LIPITOR) 40 MG tablet Take 1 tablet (40 mg total) by mouth daily. 90 tablet 3  . Cholecalciferol (VITAMIN D3) 1000 UNITS CAPS Take 1 capsule by mouth daily.    . fish oil-omega-3 fatty acids 1000 MG capsule Take 2 g by mouth daily.    . Flaxseed, Linseed, (FLAX SEED OIL) 1000 MG CAPS Take 2 capsules by mouth daily.     . Garlic 6283 MG CAPS Take 1 capsule by mouth daily.    Marland Kitchen glucose blood (ONE TOUCH ULTRA TEST) test strip 1 each by Other route 2 (two) times daily. And lancets 2/day 250.01 180 each 3  . insulin NPH Human (HUMULIN N) 100 UNIT/ML injection Inject 0.6  mLs (60 Units total) into the skin every morning. And syringes 1/day 20 mL 11  . Insulin Syringe-Needle U-100 (INSULIN SYRINGE 1CC/31GX5/16") 31G X 5/16" 1 ML MISC USE ONCE DAILY 100 each 0  . LANTUS 100 UNIT/ML injection ADMINISTER 80 UNITS UNDER THE SKIN EVERY MORNING 30 mL 0  . Misc Natural Products (COLON CLEANSER PO) Take by mouth.    . polyethylene glycol (MIRALAX / GLYCOLAX) packet Take 17 g by mouth daily as needed.     . pyridoxine (CVS VITAMIN B-6) 200 MG tablet Take 200 mg by mouth daily.    . valsartan-hydrochlorothiazide (DIOVAN-HCT) 320-25 MG tablet Take 1 tablet by mouth daily. 90 tablet 3  . vitamin B-12 (CYANOCOBALAMIN) 500 MCG tablet Take 500 mcg by mouth daily.      No current facility-administered medications on file prior to visit.     Review of Systems  Constitutional: Negative for other unusual diaphoresis or sweats HENT: Negative for ear discharge or swelling Eyes: Negative for other worsening visual disturbances Respiratory: Negative for stridor or other swelling  Gastrointestinal: Negative for worsening distension or other blood Genitourinary: Negative for retention or other urinary change Musculoskeletal: Negative for other MSK pain or swelling Skin: Negative for color change or other new lesions Neurological: Negative for worsening tremors and other numbness  Psychiatric/Behavioral: Negative for worsening agitation or other fatigue All other system neg per pt    Objective:   Physical Exam BP 138/84   Pulse 91   Temp 98.4 F (36.9 C) (Oral)   Ht 5\' 3"  (1.6 m)   Wt 222 lb (100.7 kg)   SpO2 96%   BMI 39.33 kg/m  VS noted,  Constitutional: Pt appears in NAD HENT: Head: NCAT.  Right Ear: External ear normal.  Left Ear: External ear normal.  Eyes: . Pupils are equal, round, and reactive to light. Conjunctivae and EOM are normal Nose: without d/c or deformity Neck: Neck supple. Gross normal ROM Cardiovascular: Normal rate and regular rhythm.   Pulmonary/Chest: Effort normal and breath sounds without rales or wheezing.  Neurological: Pt is alert. At baseline orientation, motor grossly intact Skin: Skin is warm. No rashes, other new lesions, chronic trace bilat ankle LE edema Psychiatric: Pt behavior is normal without agitation  No other exam findings  Lab Results  Component Value Date   WBC 6.4 10/10/2016   HGB 11.7 (L) 10/10/2016   HCT 36.1 10/10/2016   PLT 308.0 10/10/2016   GLUCOSE 80 10/10/2016   CHOL 204 (H) 10/10/2016   TRIG 145.0 10/10/2016   HDL 34.20 (L) 10/10/2016   LDLDIRECT 152.9 12/18/2011   LDLCALC 141 (H) 10/10/2016   ALT 12 10/10/2016   AST 15 10/10/2016   NA 143 10/10/2016   K 4.6 10/10/2016   CL 109 10/10/2016   CREATININE 1.22 (H) 10/10/2016   BUN 19 10/10/2016   CO2  28 10/10/2016   TSH 2.58 10/10/2016   INR 1.1 (H) 09/08/2013   HGBA1C 8.1 04/26/2017   MICROALBUR 2.7 (H) 10/10/2016       Assessment & Plan:

## 2017-05-24 NOTE — Assessment & Plan Note (Signed)
Uncontrolled, will recheck today after recent a1c improvement, consider statin for ldl > 70

## 2017-05-24 NOTE — Assessment & Plan Note (Signed)
Improving a1c recent, cont current meds, f/u endo as planned

## 2017-05-24 NOTE — Assessment & Plan Note (Signed)
stable overall by history and exam, recent data reviewed with pt, and pt to continue medical treatment as before,  to f/u any worsening symptoms or concerns BP Readings from Last 3 Encounters:  05/24/17 138/84  04/26/17 136/68  03/14/17 (!) 146/84

## 2017-05-24 NOTE — Assessment & Plan Note (Signed)
stable overall by history and exam, recent data reviewed with pt, and pt to continue medical treatment as before,  to f/u any worsening symptoms or concerns, for f/u lab today 

## 2017-05-24 NOTE — Telephone Encounter (Signed)
-----   Message from Biagio Borg, MD sent at 05/24/2017 12:53 PM EDT ----- Left message on MyChart, pt to cont same tx except   The test results show that your current treatment is OK, except the LDL cholesterol is too high.  The goal is to be less than 70.  As we discussed at your visit, we should start a mild medication for this called crestor 10 mg per day.  A new prescription will be sent, and you should be called from the office as well.Redmond Baseman to please inform pt, I will do rx

## 2017-05-24 NOTE — Patient Instructions (Addendum)

## 2017-05-24 NOTE — Telephone Encounter (Signed)
Called pt, LVM.   

## 2017-06-04 ENCOUNTER — Other Ambulatory Visit: Payer: Self-pay | Admitting: Endocrinology

## 2017-07-04 ENCOUNTER — Ambulatory Visit (INDEPENDENT_AMBULATORY_CARE_PROVIDER_SITE_OTHER): Payer: Medicare Other | Admitting: Podiatry

## 2017-07-04 ENCOUNTER — Encounter: Payer: Self-pay | Admitting: Podiatry

## 2017-07-04 DIAGNOSIS — M79676 Pain in unspecified toe(s): Secondary | ICD-10-CM | POA: Diagnosis not present

## 2017-07-04 DIAGNOSIS — B351 Tinea unguium: Secondary | ICD-10-CM

## 2017-07-04 DIAGNOSIS — E1151 Type 2 diabetes mellitus with diabetic peripheral angiopathy without gangrene: Secondary | ICD-10-CM | POA: Diagnosis not present

## 2017-07-04 NOTE — Progress Notes (Signed)
Patient ID: Gail Lynch, female   DOB: [birth year], 76 y.o.   MRN: [id number] Complaint:  Visit Type: Patient returns to my office for continued preventative foot care services. Complaint: Patient states" my nails have grown long and thick and become painful to walk and wear shoes" Patient has been diagnosed with DM with neuropathy and angiopathy.. The patient presents for preventative foot care services. No changes to ROS  Podiatric Exam: Vascular: dorsalis pedis and posterior tibial pulses are not  palpable bilateral. Capillary return is immediate. Temperature gradient is WNL. Skin turgor WNL  Sensorium: Normal Semmes Weinstein monofilament test. Normal tactile sensation bilaterally. Nail Exam: Pt has thick disfigured discolored nails with subungual debris noted bilateral entire nail hallux through fifth toenails Ulcer Exam: There is no evidence of ulcer or pre-ulcerative changes or infection. Orthopedic Exam: Muscle tone and strength are WNL. No limitations in general ROM. No crepitus or effusions noted. Foot type and digits show no abnormalities. Bony prominences are unremarkable. Pes planus Skin: No Porokeratosis. No infection or ulcers.  Callus right heel,  Asymptomatic..  Diagnosis:  Onychomycosis, , Pain in right toe, pain in left toes  Treatment & Plan Procedures and Treatment: Consent by patient was obtained for treatment procedures. The patient understood the discussion of treatment and procedures well. All questions were answered thoroughly reviewed. Debridement of mycotic and hypertrophic toenails, 1 through 5 bilateral and clearing of subungual debris. No ulceration, no infection noted.  Return Visit-Office Procedure: Patient instructed to return to the office for a follow up visit 3 months for continued evaluation and treatment.   Gardiner Barefoot DPM

## 2017-07-10 ENCOUNTER — Other Ambulatory Visit: Payer: Self-pay | Admitting: Endocrinology

## 2017-07-26 ENCOUNTER — Ambulatory Visit (INDEPENDENT_AMBULATORY_CARE_PROVIDER_SITE_OTHER): Payer: Medicare Other | Admitting: Endocrinology

## 2017-07-26 ENCOUNTER — Encounter: Payer: Self-pay | Admitting: Endocrinology

## 2017-07-26 VITALS — BP 138/70 | HR 73 | Wt 221.8 lb

## 2017-07-26 DIAGNOSIS — Z794 Long term (current) use of insulin: Secondary | ICD-10-CM

## 2017-07-26 DIAGNOSIS — E11319 Type 2 diabetes mellitus with unspecified diabetic retinopathy without macular edema: Secondary | ICD-10-CM

## 2017-07-26 LAB — POCT GLYCOSYLATED HEMOGLOBIN (HGB A1C): HEMOGLOBIN A1C: 8.8

## 2017-07-26 MED ORDER — INSULIN NPH (HUMAN) (ISOPHANE) 100 UNIT/ML ~~LOC~~ SUSP: 70.0000 [IU] | SUBCUTANEOUS | 11 refills | Status: DC

## 2017-07-26 NOTE — Patient Instructions (Addendum)
check your blood sugar twice a day.  vary the time of day when you check, between before the 3 meals, and at bedtime.  also check if you have symptoms of your blood sugar being too high or too low.  please keep a record of the readings and bring it to your next appointment here.  please call us sooner if your blood sugar goes below 70, or if it stays over 200.  As you write it out, please also write down why it might be higher or lower on a certain day.   Please increase the insulin to 70 units each morning.  You should take it in the morning, no matter what your blood sugar is.   On this type of insulin schedule, you should eat meals on a regular schedule (especially lunch).  If a meal is missed or significantly delayed, your blood sugar could go low.  Please come back for a follow-up appointment in 3 months.

## 2017-07-26 NOTE — Progress Notes (Signed)
Subjective:    Patient ID: Gail Lynch, female    DOB: 1941-01-19, 76 y.o.   MRN: [id number]  HPI Pt returns for f/u of diabetes mellitus: DM type: Insulin-requiring type 2. Dx'ed: 2703 Complications: polyneuropathy and retinopathy.   Therapy: insulin since soon after dx.   GDM: never. DKA: never Severe hypoglycemia: never.   Pancreatitis: never.   Other: she declines multiple daily injections; she declines to increase frequency of injections; she cannot have weight-loss surgery, due to having medicaid; she changed from lantus to NPH, due to pattern of cbg's.   Interval history: no cbg record, but states cbg's vary from 74-200's.  It is in general higher as the day goes on.  pt states she feels well in general.  Pt says she has hypoglycemia approx once per month, and these episodes are mild. She often does not take the insulin until later in the day.  Past Medical History:  Diagnosis Date  . Arthritis    gout in feet  . Asthma    hx as a child - no inhaler, no problems  . Carotid stenosis 06/18/2012  . Colon polyps   . Diabetes mellitus    Type 2  . Dysplasia of cervix, low grade (CIN 1)    s/p cervical conization  . Glaucoma   . Glaucoma 12/18/2011  . Hard of hearing    hear aids - left  . HPV (human papilloma virus) infection   . Hyperlipidemia   . Hypertension   . Intrinsic asthma, unspecified 12/18/2011  . Mild aortic stenosis 03/14/2017  . Mycotic toenails 02/19/2013  . PONV (postoperative nausea and vomiting)   . SVD (spontaneous vaginal delivery)    x 4    Past Surgical History:  Procedure Laterality Date  . BREAST BIOPSY  2010  . BREAST BIOPSY  1991  . COLONOSCOPY    . DILATION AND CURETTAGE OF UTERUS  2000  . HYSTEROSCOPY W/D&C N/A 03/04/2014   Procedure: DILATATION AND CURETTAGE /HYSTEROSCOPY;  Surgeon: Lavonia Drafts, MD;  Location: [facility];  Service: Gynecology;  Laterality: N/A;  . KNEE SURGERY  2010   patella fracture  . MULTIPLE TOOTH  EXTRACTIONS    . POLYPECTOMY      Social History   Socioeconomic History  . Marital status: Widowed    Spouse name: Not on file  . Number of children: 3  . Years of education: Not on file  . Highest education level: Not on file  Social Needs  . Financial resource strain: Not on file  . Food insecurity - worry: Not on file  . Food insecurity - inability: Not on file  . Transportation needs - medical: Not on file  . Transportation needs - non-medical: Not on file  Occupational History  . Occupation: retired  Tobacco Use  . Smoking status: Former Smoker    Types: Cigarettes    Last attempt to quit: 08/21/1980    Years since quitting: 36.9  . Smokeless tobacco: Former Network engineer and Sexual Activity  . Alcohol use: No    Alcohol/week: 0.0 oz  . Drug use: No  . Sexual activity: Yes    Birth control/protection: Post-menopausal  Other Topics Concern  . Not on file  Social History Narrative  . Not on file    Current Outpatient Medications on File Prior to Visit  Medication Sig Dispense Refill  . acetaminophen (TYLENOL) 500 MG tablet Take 1,000 mg by mouth every 6 (six) hours as needed.    Marland Kitchen  allopurinol (ZYLOPRIM) 300 MG tablet Take 1 tablet (300 mg total) by mouth 2 (two) times daily. Must keep 11/15/16 appt for future refills 90 tablet 3  . atorvastatin (LIPITOR) 40 MG tablet Take 1 tablet (40 mg total) by mouth daily. 90 tablet 3  . Cholecalciferol (VITAMIN D3) 1000 UNITS CAPS Take 1 capsule by mouth daily.    . fish oil-omega-3 fatty acids 1000 MG capsule Take 2 g by mouth daily.    . Flaxseed, Linseed, (FLAX SEED OIL) 1000 MG CAPS Take 2 capsules by mouth daily.     . Garlic 8756 MG CAPS Take 1 capsule by mouth daily.    Marland Kitchen glucose blood (ONE TOUCH ULTRA TEST) test strip 1 each by Other route 2 (two) times daily. And lancets 2/day 250.01 180 each 3  . Insulin Syringe-Needle U-100 (INSULIN SYRINGE 1CC/31GX5/16") 31G X 5/16" 1 ML MISC USE ONCE DAILY 100 each 0  . LANTUS 100  UNIT/ML injection ADMINISTER 80 UNITS UNDER THE SKIN EVERY MORNING 30 mL 0  . Misc Natural Products (COLON CLEANSER PO) Take by mouth.    . polyethylene glycol (MIRALAX / GLYCOLAX) packet Take 17 g by mouth daily as needed.     . pyridoxine (CVS VITAMIN B-6) 200 MG tablet Take 200 mg by mouth daily.    . rosuvastatin (CRESTOR) 10 MG tablet Take 1 tablet (10 mg total) by mouth daily. 90 tablet 3  . valsartan-hydrochlorothiazide (DIOVAN-HCT) 320-25 MG tablet Take 1 tablet by mouth daily. 90 tablet 3  . vitamin B-12 (CYANOCOBALAMIN) 500 MCG tablet Take 500 mcg by mouth daily.      No current facility-administered medications on file prior to visit.     Allergies  Allergen Reactions  . Aspirin Other (See Comments)    BLEEDING BEHIND EYES  . Penicillins     Family History  Problem Relation Age of Onset  . Diabetes Brother   . Heart disease Brother   . Heart disease Mother   . Heart disease Father   . Asthma Unknown   . Colon cancer Neg Hx   . Rectal cancer Neg Hx   . Stomach cancer Neg Hx   . Esophageal cancer Neg Hx     BP 138/70 (BP Location: Left Arm, Patient Position: Sitting, Cuff Size: Normal)   Pulse 73   Wt 221 lb 12.8 oz (100.6 kg)   SpO2 99%   BMI 39.29 kg/m    Review of Systems Denies LOC.      Objective:   Physical Exam VITAL SIGNS:  See vs page.  GENERAL: no distress. Pulses: foot pulses are intact bilaterally.   MSK: no deformity of the feet or ankles.  CV: 1+ bilat edema of the legs Skin:  no ulcer on the feet or ankles.  normal color and temp on the feet and ankles Neuro: sensation is intact to touch on the feet and ankles.     Lab Results  Component Value Date   HGBA1C 8.8 07/26/2017      Assessment & Plan:  Insulin-requiring type 2 DM, with DR: worse Noncompliance with timing of insulin.  We discussed the need to take in am, as it is a time-release medication  Patient Instructions  check your blood sugar twice a day.  vary the time of day  when you check, between before the 3 meals, and at bedtime.  also check if you have symptoms of your blood sugar being too high or too low.  please keep a record  of the readings and bring it to your next appointment here.  please call us sooner if your blood sugar goes below 70, or if it stays over 200.  As you write it out, please also write down why it might be higher or lower on a certain day.   Please increase the insulin to 70 units each morning.  You should take it in the morning, no matter what your blood sugar is.   On this type of insulin schedule, you should eat meals on a regular schedule (especially lunch).  If a meal is missed or significantly delayed, your blood sugar could go low.  Please come back for a follow-up appointment in 3 months.

## 2017-08-15 DIAGNOSIS — Z794 Long term (current) use of insulin: Secondary | ICD-10-CM | POA: Diagnosis not present

## 2017-08-15 DIAGNOSIS — E119 Type 2 diabetes mellitus without complications: Secondary | ICD-10-CM | POA: Diagnosis not present

## 2017-08-17 ENCOUNTER — Other Ambulatory Visit: Payer: Self-pay | Admitting: Endocrinology

## 2017-09-21 ENCOUNTER — Other Ambulatory Visit: Payer: Self-pay | Admitting: Endocrinology

## 2017-10-03 ENCOUNTER — Ambulatory Visit (INDEPENDENT_AMBULATORY_CARE_PROVIDER_SITE_OTHER): Payer: Medicare Other | Admitting: Podiatry

## 2017-10-03 ENCOUNTER — Encounter: Payer: Self-pay | Admitting: Podiatry

## 2017-10-03 DIAGNOSIS — B351 Tinea unguium: Secondary | ICD-10-CM

## 2017-10-03 DIAGNOSIS — E1151 Type 2 diabetes mellitus with diabetic peripheral angiopathy without gangrene: Secondary | ICD-10-CM

## 2017-10-03 DIAGNOSIS — Q6650 Congenital pes planus, unspecified foot: Secondary | ICD-10-CM

## 2017-10-03 DIAGNOSIS — M79676 Pain in unspecified toe(s): Secondary | ICD-10-CM | POA: Diagnosis not present

## 2017-10-03 NOTE — Progress Notes (Signed)
Patient ID: Gail Lynch, female   DOB: 04-Aug-1941, 77 y.o.   MRN: [id number] Complaint:  Visit Type: Patient returns to my office for continued preventative foot care services. Complaint: Patient states" my nails have grown long and thick and become painful to walk and wear shoes" Patient has been diagnosed with DM with neuropathy and angiopathy.. The patient presents for preventative foot care services. No changes to ROS  Podiatric Exam: Vascular: dorsalis pedis and posterior tibial pulses are not  palpable bilateral. Capillary return is immediate. Temperature gradient is WNL. Skin turgor WNL  Sensorium: Normal Semmes Weinstein monofilament test. Normal tactile sensation bilaterally. Nail Exam: Pt has thick disfigured discolored nails with subungual debris noted bilateral entire nail hallux through fifth toenails Ulcer Exam: There is no evidence of ulcer or pre-ulcerative changes or infection. Orthopedic Exam: Muscle tone and strength are WNL. No limitations in general ROM. No crepitus or effusions noted. Foot type and digits show no abnormalities. Bony prominences are unremarkable. Pes planus Skin: No Porokeratosis. No infection or ulcers.  Callus right heel,  Asymptomatic..  Diagnosis:  Onychomycosis, , Pain in right toe, pain in left toes  Treatment & Plan Procedures and Treatment: Consent by patient was obtained for treatment procedures. The patient understood the discussion of treatment and procedures well. All questions were answered thoroughly reviewed. Debridement of mycotic and hypertrophic toenails, 1 through 5 bilateral and clearing of subungual debris. No ulceration, no infection noted.  Return Visit-Office Procedure: Patient instructed to return to the office for a follow up visit 3 months for continued evaluation and treatment.   Gardiner Barefoot DPM

## 2017-10-25 ENCOUNTER — Ambulatory Visit: Payer: Medicare Other | Admitting: Endocrinology

## 2017-10-26 ENCOUNTER — Other Ambulatory Visit: Payer: Self-pay | Admitting: Endocrinology

## 2017-11-02 ENCOUNTER — Other Ambulatory Visit: Payer: Self-pay | Admitting: Internal Medicine

## 2017-11-05 ENCOUNTER — Other Ambulatory Visit: Payer: Self-pay | Admitting: Internal Medicine

## 2017-11-13 DIAGNOSIS — Z794 Long term (current) use of insulin: Secondary | ICD-10-CM | POA: Diagnosis not present

## 2017-11-13 DIAGNOSIS — E119 Type 2 diabetes mellitus without complications: Secondary | ICD-10-CM | POA: Diagnosis not present

## 2017-11-21 ENCOUNTER — Other Ambulatory Visit (INDEPENDENT_AMBULATORY_CARE_PROVIDER_SITE_OTHER): Payer: Medicare Other

## 2017-11-21 DIAGNOSIS — Z794 Long term (current) use of insulin: Secondary | ICD-10-CM

## 2017-11-21 DIAGNOSIS — E11319 Type 2 diabetes mellitus with unspecified diabetic retinopathy without macular edema: Secondary | ICD-10-CM

## 2017-11-21 LAB — BASIC METABOLIC PANEL
BUN: 26 mg/dL — AB (ref 6–23)
CALCIUM: 9.1 mg/dL (ref 8.4–10.5)
CO2: 27 mEq/L (ref 19–32)
CREATININE: 1.28 mg/dL — AB (ref 0.40–1.20)
Chloride: 104 mEq/L (ref 96–112)
GFR: 51.75 mL/min — AB (ref >60.00)
GLUCOSE: 233 mg/dL — AB (ref 70–99)
POTASSIUM: 4.4 meq/L (ref 3.5–5.1)
Sodium: 139 mEq/L (ref 135–145)

## 2017-11-21 LAB — URINALYSIS, ROUTINE W REFLEX MICROSCOPIC
Bilirubin Urine: NEGATIVE
KETONES UR: NEGATIVE
NITRITE: NEGATIVE
PH: 5.5 (ref 5.0–8.0)
SPECIFIC GRAVITY, URINE: 1.015 (ref 1.000–1.030)
Total Protein, Urine: NEGATIVE
URINE GLUCOSE: NEGATIVE
UROBILINOGEN UA: 0.2 (ref 0.0–1.0)

## 2017-11-21 LAB — CBC WITH DIFFERENTIAL/PLATELET
BASOS PCT: 0.7 % (ref 0.0–3.0)
Basophils Absolute: 0.1 10*3/uL (ref 0.0–0.1)
EOS ABS: 0.1 10*3/uL (ref 0.0–0.7)
Eosinophils Relative: 1.4 % (ref 0.0–5.0)
HEMATOCRIT: 36 % (ref 36.0–46.0)
HEMOGLOBIN: 11.7 g/dL — AB (ref 12.0–15.0)
Lymphocytes Relative: 27.5 % (ref 12.0–46.0)
Lymphs Abs: 2.3 10*3/uL (ref 0.7–4.0)
MCHC: 32.4 g/dL (ref 30.0–36.0)
MCV: 86.8 fl (ref 78.0–100.0)
Monocytes Absolute: 0.9 10*3/uL (ref 0.1–1.0)
Monocytes Relative: 10.6 % (ref 3.0–12.0)
Neutro Abs: 5.1 10*3/uL (ref 1.4–7.7)
Neutrophils Relative %: 59.8 % (ref 43.0–77.0)
Platelets: 330 10*3/uL (ref 150.0–400.0)
RBC: 4.15 Mil/uL (ref 3.87–5.11)
RDW: 14.1 % (ref 11.5–15.5)
WBC: 8.5 10*3/uL (ref 4.0–10.5)

## 2017-11-21 LAB — HEMOGLOBIN A1C: HEMOGLOBIN A1C: 8.8 % — AB (ref 4.6–6.5)

## 2017-11-21 LAB — LIPID PANEL
CHOLESTEROL: 188 mg/dL (ref 0–200)
HDL: 35.4 mg/dL — ABNORMAL LOW (ref >39.00)
LDL Cholesterol: 122 mg/dL — ABNORMAL HIGH (ref 0–99)
NonHDL: 153.03
Total CHOL/HDL Ratio: 5
Triglycerides: 155 mg/dL — ABNORMAL HIGH (ref 0.0–149.0)
VLDL: 31 mg/dL (ref 0.0–40.0)

## 2017-11-21 LAB — MICROALBUMIN / CREATININE URINE RATIO
CREATININE, U: 113.3 mg/dL
MICROALB UR: 2.6 mg/dL — AB (ref 0.0–1.9)
MICROALB/CREAT RATIO: 2.3 mg/g (ref 0.0–30.0)

## 2017-11-21 LAB — HEPATIC FUNCTION PANEL
ALK PHOS: 90 U/L (ref 39–117)
ALT: 13 U/L (ref 0–35)
AST: 15 U/L (ref 0–37)
Albumin: 3.2 g/dL — ABNORMAL LOW (ref 3.5–5.2)
BILIRUBIN DIRECT: 0.1 mg/dL (ref 0.0–0.3)
BILIRUBIN TOTAL: 0.2 mg/dL (ref 0.2–1.2)
TOTAL PROTEIN: 6.7 g/dL (ref 6.0–8.3)

## 2017-11-21 LAB — TSH: TSH: 2.63 u[IU]/mL (ref 0.35–4.50)

## 2017-11-22 ENCOUNTER — Ambulatory Visit: Payer: Medicare Other | Admitting: Internal Medicine

## 2017-11-28 ENCOUNTER — Ambulatory Visit: Payer: Medicare Other | Admitting: Internal Medicine

## 2017-11-28 DIAGNOSIS — Z0289 Encounter for other administrative examinations: Secondary | ICD-10-CM

## 2017-11-30 ENCOUNTER — Encounter: Payer: Self-pay | Admitting: Endocrinology

## 2017-11-30 ENCOUNTER — Other Ambulatory Visit: Payer: Self-pay | Admitting: Endocrinology

## 2017-11-30 ENCOUNTER — Ambulatory Visit (INDEPENDENT_AMBULATORY_CARE_PROVIDER_SITE_OTHER): Payer: Medicare Other | Admitting: Endocrinology

## 2017-11-30 VITALS — BP 142/62 | HR 74 | Wt 220.2 lb

## 2017-11-30 DIAGNOSIS — E11319 Type 2 diabetes mellitus with unspecified diabetic retinopathy without macular edema: Secondary | ICD-10-CM

## 2017-11-30 DIAGNOSIS — Z794 Long term (current) use of insulin: Secondary | ICD-10-CM

## 2017-11-30 MED ORDER — INSULIN NPH (HUMAN) (ISOPHANE) 100 UNIT/ML ~~LOC~~ SUSP: 80.0000 [IU] | SUBCUTANEOUS | 11 refills | Status: DC

## 2017-11-30 NOTE — Patient Instructions (Addendum)
check your blood sugar twice a day.  vary the time of day when you check, between before the 3 meals, and at bedtime.  also check if you have symptoms of your blood sugar being too high or too low.  please keep a record of the readings and bring it to your next appointment here.  please call us sooner if your blood sugar goes below 70, or if it stays over 200.  As you write it out, please also write down why it might be higher or lower on a certain day.   Please increase the insulin to 80 units each morning.  You should take it in the morning, no matter what your blood sugar is.   On this type of insulin schedule, you should eat meals on a regular schedule (especially lunch).  If a meal is missed or significantly delayed, your blood sugar could go low.  Please come back for a follow-up appointment in 3 months.

## 2017-11-30 NOTE — Progress Notes (Signed)
Subjective:    Patient ID: Gail Lynch, female    DOB: [birth year], 77 y.o.   MRN: [id number]  HPI Pt returns for f/u of diabetes mellitus: DM type: Insulin-requiring type 2. Dx'ed: 9735 Complications: polyneuropathy, renal insuff, and retinopathy.   Therapy: insulin since soon after dx.   GDM: never. DKA: never Severe hypoglycemia: never.   Pancreatitis: never.   Other: she declines multiple daily injections; she declines to increase frequency of injections; she cannot have weight-loss surgery, due to having medicaid; she changed from lantus to NPH, due to pattern of cbg's.   Interval history: she brings a record of her cbg's which I have reviewed today.  It varies from 67-400. It is in general higher as the day goes on pt states she feels well in general.  She takes the insulin in the morning.   Past Medical History:  Diagnosis Date  . Arthritis    gout in feet  . Asthma    hx as a child - no inhaler, no problems  . Carotid stenosis 06/18/2012  . Colon polyps   . Diabetes mellitus    Type 2  . Dysplasia of cervix, low grade (CIN 1)    s/p cervical conization  . Glaucoma   . Glaucoma 12/18/2011  . Hard of hearing    hear aids - left  . HPV (human papilloma virus) infection   . Hyperlipidemia   . Hypertension   . Intrinsic asthma, unspecified 12/18/2011  . Mild aortic stenosis 03/14/2017  . Mycotic toenails 02/19/2013  . PONV (postoperative nausea and vomiting)   . SVD (spontaneous vaginal delivery)    x 4    Past Surgical History:  Procedure Laterality Date  . BREAST BIOPSY  2010  . BREAST BIOPSY  1991  . COLONOSCOPY    . DILATION AND CURETTAGE OF UTERUS  2000  . HYSTEROSCOPY W/D&C N/A 03/04/2014   Procedure: DILATATION AND CURETTAGE /HYSTEROSCOPY;  Surgeon: Lavonia Drafts, MD;  Location: [facility];  Service: Gynecology;  Laterality: N/A;  . KNEE SURGERY  2010   patella fracture  . MULTIPLE TOOTH EXTRACTIONS    . POLYPECTOMY      Social History    Socioeconomic History  . Marital status: Widowed    Spouse name: Not on file  . Number of children: 3  . Years of education: Not on file  . Highest education level: Not on file  Occupational History  . Occupation: retired  Scientific laboratory technician  . Financial resource strain: Not on file  . Food insecurity:    Worry: Not on file    Inability: Not on file  . Transportation needs:    Medical: Not on file    Non-medical: Not on file  Tobacco Use  . Smoking status: Former Smoker    Types: Cigarettes    Last attempt to quit: 08/21/1980    Years since quitting: 37.3  . Smokeless tobacco: Former Network engineer and Sexual Activity  . Alcohol use: No    Alcohol/week: 0.0 oz  . Drug use: No  . Sexual activity: Yes    Birth control/protection: Post-menopausal  Lifestyle  . Physical activity:    Days per week: Not on file    Minutes per session: Not on file  . Stress: Not on file  Relationships  . Social connections:    Talks on phone: Not on file    Gets together: Not on file    Attends religious service: Not on file  Active member of club or organization: Not on file    Attends meetings of clubs or organizations: Not on file    Relationship status: Not on file  . Intimate partner violence:    Fear of current or ex partner: Not on file    Emotionally abused: Not on file    Physically abused: Not on file    Forced sexual activity: Not on file  Other Topics Concern  . Not on file  Social History Narrative  . Not on file    Current Outpatient Medications on File Prior to Visit  Medication Sig Dispense Refill  . acetaminophen (TYLENOL) 500 MG tablet Take 1,000 mg by mouth every 6 (six) hours as needed.    Marland Kitchen allopurinol (ZYLOPRIM) 300 MG tablet Take 1 tablet (300 mg total) by mouth 2 (two) times daily. Must keep 11/15/16 appt for future refills 90 tablet 3  . atorvastatin (LIPITOR) 40 MG tablet TAKE 1 TABLET(40 MG) BY MOUTH DAILY 90 tablet 0  . Cholecalciferol (VITAMIN D3) 1000  UNITS CAPS Take 1 capsule by mouth daily.    . fish oil-omega-3 fatty acids 1000 MG capsule Take 2 g by mouth daily.    . Flaxseed, Linseed, (FLAX SEED OIL) 1000 MG CAPS Take 2 capsules by mouth daily.     . Garlic 7253 MG CAPS Take 1 capsule by mouth daily.    Marland Kitchen glucose blood (ONE TOUCH ULTRA TEST) test strip 1 each by Other route 2 (two) times daily. And lancets 2/day 250.01 180 each 3  . Insulin Syringe-Needle U-100 (INSULIN SYRINGE 1CC/31GX5/16") 31G X 5/16" 1 ML MISC USE ONCE DAILY 100 each 0  . Misc Natural Products (COLON CLEANSER PO) Take by mouth.    . polyethylene glycol (MIRALAX / GLYCOLAX) packet Take 17 g by mouth daily as needed.     . pyridoxine (CVS VITAMIN B-6) 200 MG tablet Take 200 mg by mouth daily.    . rosuvastatin (CRESTOR) 10 MG tablet Take 1 tablet (10 mg total) by mouth daily. 90 tablet 3  . valsartan-hydrochlorothiazide (DIOVAN-HCT) 320-12.5 MG tablet TAKE 1 TABLET BY MOUTH DAILY 90 tablet 0  . valsartan-hydrochlorothiazide (DIOVAN-HCT) 320-25 MG tablet Take 1 tablet by mouth daily. 90 tablet 3  . vitamin B-12 (CYANOCOBALAMIN) 500 MCG tablet Take 500 mcg by mouth daily.      No current facility-administered medications on file prior to visit.     Allergies  Allergen Reactions  . Aspirin Other (See Comments)    BLEEDING BEHIND EYES  . Penicillins     Family History  Problem Relation Age of Onset  . Diabetes Brother   . Heart disease Brother   . Heart disease Mother   . Heart disease Father   . Asthma Unknown   . Colon cancer Neg Hx   . Rectal cancer Neg Hx   . Stomach cancer Neg Hx   . Esophageal cancer Neg Hx     BP (!) 142/62 (BP Location: Left Arm, Patient Position: Sitting, Cuff Size: Normal)   Pulse 74   Wt 220 lb 3.2 oz (99.9 kg)   SpO2 94%   BMI 39.01 kg/m    Review of Systems Denies LOC    Objective:   Physical Exam VITAL SIGNS:  See vs page.  GENERAL: no distress. Pulses: foot pulses are intact bilaterally.   MSK: no deformity of  the feet or ankles.  CV: 1+ bilat edema of the legs Skin:  no ulcer on the feet or  ankles.  normal color and temp on the feet and ankles Neuro: sensation is intact to touch on the feet and ankles.    Lab Results  Component Value Date   HGBA1C 8.8 (H) 11/21/2017   Lab Results  Component Value Date   CREATININE 1.28 (H) 11/21/2017   BUN 26 (H) 11/21/2017   NA 139 11/21/2017   K 4.4 11/21/2017   CL 104 11/21/2017   CO2 27 11/21/2017      Assessment & Plan:  Insulin-requiring type 2 DM, with DR: she needs increased rx  Patient Instructions  check your blood sugar twice a day.  vary the time of day when you check, between before the 3 meals, and at bedtime.  also check if you have symptoms of your blood sugar being too high or too low.  please keep a record of the readings and bring it to your next appointment here.  please call us sooner if your blood sugar goes below 70, or if it stays over 200.  As you write it out, please also write down why it might be higher or lower on a certain day.   Please increase the insulin to 80 units each morning.  You should take it in the morning, no matter what your blood sugar is.   On this type of insulin schedule, you should eat meals on a regular schedule (especially lunch).  If a meal is missed or significantly delayed, your blood sugar could go low.  Please come back for a follow-up appointment in 3 months.

## 2017-12-03 ENCOUNTER — Encounter: Payer: Self-pay | Admitting: Internal Medicine

## 2017-12-03 ENCOUNTER — Ambulatory Visit (INDEPENDENT_AMBULATORY_CARE_PROVIDER_SITE_OTHER): Payer: Medicare Other | Admitting: Internal Medicine

## 2017-12-03 VITALS — BP 142/86 | HR 83 | Temp 98.5°F | Ht 63.0 in | Wt 221.0 lb

## 2017-12-03 DIAGNOSIS — Z794 Long term (current) use of insulin: Secondary | ICD-10-CM

## 2017-12-03 DIAGNOSIS — E2839 Other primary ovarian failure: Secondary | ICD-10-CM | POA: Diagnosis not present

## 2017-12-03 DIAGNOSIS — Z0000 Encounter for general adult medical examination without abnormal findings: Secondary | ICD-10-CM

## 2017-12-03 DIAGNOSIS — E11319 Type 2 diabetes mellitus with unspecified diabetic retinopathy without macular edema: Secondary | ICD-10-CM

## 2017-12-03 DIAGNOSIS — E785 Hyperlipidemia, unspecified: Secondary | ICD-10-CM | POA: Diagnosis not present

## 2017-12-03 MED ORDER — ATORVASTATIN CALCIUM 80 MG PO TABS: 80.0000 mg | ORAL_TABLET | Freq: Every day | ORAL | 3 refills | Status: DC

## 2017-12-03 NOTE — Assessment & Plan Note (Signed)

## 2017-12-03 NOTE — Patient Instructions (Addendum)
You will be contacted regarding the referral for: eye doctor  Please schedule the bone density test before leaving today at the scheduling desk (where you check out) - per your request ok to schedule the test for the day of your blood work before your next appointment  [place] to increase the lipitor to 80 mg per day  Please continue all other medications as before, and refills have been done if requested.  Please have the pharmacy call with any other refills you may need.  Please continue your efforts at being more active, low cholesterol diet, and weight control.  You are otherwise up to date with prevention measures today.  Please keep your appointments with your specialists as you may have planned  Please return in 6 months, or sooner if needed, with Lab testing done 3-5 days before

## 2017-12-03 NOTE — Progress Notes (Signed)
Subjective:    Patient ID: Gail Lynch, female    DOB: Jun 11, 1941, 77 y.o.   MRN: [id number]  HPI  Here for wellness and f/u;  Overall doing ok;  Pt denies Chest pain, worsening SOB, DOE, wheezing, orthopnea, PND, worsening LE edema, palpitations, dizziness or syncope.  Pt denies neurological change such as new headache, facial or extremity weakness.  Pt denies polydipsia, polyuria, or low sugar symptoms. Pt states overall good compliance with treatment and medications, good tolerability, and has been trying to follow appropriate diet.  Pt denies worsening depressive symptoms, suicidal ideation or panic. No fever, night sweats, wt loss, loss of appetite, or other constitutional symptoms.  Pt states good ability with ADL's, has low fall risk, home safety reviewed and adequate, no other significant changes in hearing or vision, and only occasionally active with exercise.  Due for DXA. Has appt with dietician in may.  No other interval hx or new complaint Past Medical History:  Diagnosis Date  . Arthritis    gout in feet  . Asthma    hx as a child - no inhaler, no problems  . Carotid stenosis 06/18/2012  . Colon polyps   . Diabetes mellitus    Type 2  . Dysplasia of cervix, low grade (CIN 1)    s/p cervical conization  . Glaucoma   . Glaucoma 12/18/2011  . Hard of hearing    hear aids - left  . HPV (human papilloma virus) infection   . Hyperlipidemia   . Hypertension   . Intrinsic asthma, unspecified 12/18/2011  . Mild aortic stenosis 03/14/2017  . Mycotic toenails 02/19/2013  . PONV (postoperative nausea and vomiting)   . SVD (spontaneous vaginal delivery)    x 4   Past Surgical History:  Procedure Laterality Date  . BREAST BIOPSY  2010  . BREAST BIOPSY  1991  . COLONOSCOPY    . DILATION AND CURETTAGE OF UTERUS  2000  . HYSTEROSCOPY W/D&C N/A 03/04/2014   Procedure: DILATATION AND CURETTAGE /HYSTEROSCOPY;  Surgeon: Lavonia Drafts, MD;  Location: [facility];  Service: Gynecology;   Laterality: N/A;  . KNEE SURGERY  2010   patella fracture  . MULTIPLE TOOTH EXTRACTIONS    . POLYPECTOMY      reports that she quit smoking about 37 years ago. Her smoking use included cigarettes. She has quit using smokeless tobacco. She reports that she does not drink alcohol or use drugs. family history includes Asthma in her unknown relative; Diabetes in her brother; Heart disease in her brother, father, and mother. Allergies  Allergen Reactions  . Aspirin Other (See Comments)    BLEEDING BEHIND EYES  . Penicillins    Current Outpatient Medications on File Prior to Visit  Medication Sig Dispense Refill  . acetaminophen (TYLENOL) 500 MG tablet Take 1,000 mg by mouth every 6 (six) hours as needed.    Marland Kitchen allopurinol (ZYLOPRIM) 300 MG tablet Take 1 tablet (300 mg total) by mouth 2 (two) times daily. Must keep 11/15/16 appt for future refills 90 tablet 3  . Cholecalciferol (VITAMIN D3) 1000 UNITS CAPS Take 1 capsule by mouth daily.    . fish oil-omega-3 fatty acids 1000 MG capsule Take 2 g by mouth daily.    . Flaxseed, Linseed, (FLAX SEED OIL) 1000 MG CAPS Take 2 capsules by mouth daily.     . Garlic 6237 MG CAPS Take 1 capsule by mouth daily.    Marland Kitchen glucose blood (ONE TOUCH ULTRA TEST) test strip 1  each by Other route 2 (two) times daily. And lancets 2/day 250.01 180 each 3  . insulin NPH Human (HUMULIN N) 100 UNIT/ML injection Inject 0.8 mLs (80 Units total) into the skin every morning. And syringes 1/day 30 mL 11  . Insulin Syringe-Needle U-100 (INSULIN SYRINGE 1CC/31GX5/16") 31G X 5/16" 1 ML MISC USE ONCE DAILY 100 each 0  . LANTUS 100 UNIT/ML injection ADMINISTER 80 UNITS UNDER THE SKIN EVERY MORNING 30 mL 0  . Misc Natural Products (COLON CLEANSER PO) Take by mouth.    . polyethylene glycol (MIRALAX / GLYCOLAX) packet Take 17 g by mouth daily as needed.     . pyridoxine (CVS VITAMIN B-6) 200 MG tablet Take 200 mg by mouth daily.    . rosuvastatin (CRESTOR) 10 MG tablet Take 1 tablet  (10 mg total) by mouth daily. 90 tablet 3  . valsartan-hydrochlorothiazide (DIOVAN-HCT) 320-12.5 MG tablet TAKE 1 TABLET BY MOUTH DAILY 90 tablet 0  . valsartan-hydrochlorothiazide (DIOVAN-HCT) 320-25 MG tablet Take 1 tablet by mouth daily. 90 tablet 3  . vitamin B-12 (CYANOCOBALAMIN) 500 MCG tablet Take 500 mcg by mouth daily.      No current facility-administered medications on file prior to visit.    Review of Systems Constitutional: Negative for other unusual diaphoresis, sweats, appetite or weight changes HENT: Negative for other worsening hearing loss, ear pain, facial swelling, mouth sores or neck stiffness.   Eyes: Negative for other worsening pain, redness or other visual disturbance.  Respiratory: Negative for other stridor or swelling Cardiovascular: Negative for other palpitations or other chest pain  Gastrointestinal: Negative for worsening diarrhea or loose stools, blood in stool, distention or other pain Genitourinary: Negative for hematuria, flank pain or other change in urine volume.  Musculoskeletal: Negative for myalgias or other joint swelling.  Skin: Negative for other color change, or other wound or worsening drainage.  Neurological: Negative for other syncope or numbness. Hematological: Negative for other adenopathy or swelling Psychiatric/Behavioral: Negative for hallucinations, other worsening agitation, SI, self-injury, or new decreased concentration All other system neg per pt    Objective:   Physical Exam BP (!) 142/86   Pulse 83   Temp 98.5 F (36.9 C) (Oral)   Ht 5\' 3"  (1.6 m)   Wt 221 lb (100.2 kg)   SpO2 97%   BMI 39.15 kg/m  VS noted,  Constitutional: Pt is oriented to person, place, and time. Appears well-developed and well-nourished, in no significant distress and comfortable Head: Normocephalic and atraumatic  Eyes: Conjunctivae and EOM are normal. Pupils are equal, round, and reactive to light Right Ear: External ear normal without  discharge Left Ear: External ear normal without discharge Nose: Nose without discharge or deformity Mouth/Throat: Oropharynx is without other ulcerations and moist  Neck: Normal range of motion. Neck supple. No JVD present. No tracheal deviation present or significant neck LA or mass Cardiovascular: Normal rate, regular rhythm, normal heart sounds and intact distal pulses.   Pulmonary/Chest: WOB normal and breath sounds without rales or wheezing  Abdominal: Soft. Bowel sounds are normal. NT. No HSM  Musculoskeletal: Normal range of motion. Exhibits no edema Lymphadenopathy: Has no other cervical adenopathy.  Neurological: Pt is alert and oriented to person, place, and time. Pt has normal reflexes. No cranial nerve deficit. Motor grossly intact, Gait intact Skin: Skin is warm and dry. No rash noted or new ulcerations Psychiatric:  Has normal mood and affect. Behavior is normal without agitation No other exam findings Lab Results  Component Value  Date   WBC 8.5 11/21/2017   HGB 11.7 (L) 11/21/2017   HCT 36.0 11/21/2017   PLT 330.0 11/21/2017   GLUCOSE 233 (H) 11/21/2017   CHOL 188 11/21/2017   TRIG 155.0 (H) 11/21/2017   HDL 35.40 (L) 11/21/2017   LDLDIRECT 152.9 12/18/2011   LDLCALC 122 (H) 11/21/2017   ALT 13 11/21/2017   AST 15 11/21/2017   NA 139 11/21/2017   K 4.4 11/21/2017   CL 104 11/21/2017   CREATININE 1.28 (H) 11/21/2017   BUN 26 (H) 11/21/2017   CO2 27 11/21/2017   TSH 2.63 11/21/2017   INR 1.1 (H) 09/08/2013   HGBA1C 8.8 (H) 11/21/2017   MICROALBUR 2.6 (H) 11/21/2017      Assessment & Plan:

## 2017-12-03 NOTE — Assessment & Plan Note (Signed)
Pt plans to cont f/u with endo

## 2017-12-03 NOTE — Assessment & Plan Note (Signed)
Ok to increase the lipitor to 80 mg

## 2017-12-11 ENCOUNTER — Emergency Department (HOSPITAL_COMMUNITY)
Admission: EM | Admit: 2017-12-11 | Discharge: 2017-12-11 | Disposition: A | Discharge status: Home / Self Care | Payer: Medicare Other | Attending: Emergency Medicine | Admitting: Emergency Medicine

## 2017-12-11 ENCOUNTER — Other Ambulatory Visit: Payer: Self-pay

## 2017-12-11 ENCOUNTER — Encounter (HOSPITAL_COMMUNITY): Payer: Self-pay | Admitting: Emergency Medicine

## 2017-12-11 DIAGNOSIS — N390 Urinary tract infection, site not specified: Secondary | ICD-10-CM

## 2017-12-11 DIAGNOSIS — Z87891 Personal history of nicotine dependence: Secondary | ICD-10-CM | POA: Insufficient documentation

## 2017-12-11 DIAGNOSIS — Z794 Long term (current) use of insulin: Secondary | ICD-10-CM | POA: Insufficient documentation

## 2017-12-11 DIAGNOSIS — E1122 Type 2 diabetes mellitus with diabetic chronic kidney disease: Secondary | ICD-10-CM | POA: Diagnosis not present

## 2017-12-11 DIAGNOSIS — J45909 Unspecified asthma, uncomplicated: Secondary | ICD-10-CM | POA: Insufficient documentation

## 2017-12-11 DIAGNOSIS — R319 Hematuria, unspecified: Secondary | ICD-10-CM | POA: Insufficient documentation

## 2017-12-11 DIAGNOSIS — Z79899 Other long term (current) drug therapy: Secondary | ICD-10-CM | POA: Insufficient documentation

## 2017-12-11 DIAGNOSIS — R109 Unspecified abdominal pain: Secondary | ICD-10-CM | POA: Diagnosis not present

## 2017-12-11 DIAGNOSIS — I129 Hypertensive chronic kidney disease with stage 1 through stage 4 chronic kidney disease, or unspecified chronic kidney disease: Secondary | ICD-10-CM | POA: Diagnosis not present

## 2017-12-11 DIAGNOSIS — N183 Chronic kidney disease, stage 3 (moderate): Secondary | ICD-10-CM | POA: Insufficient documentation

## 2017-12-11 LAB — CBC
HCT: 36.4 % (ref 36.0–46.0)
Hemoglobin: 11.5 g/dL — ABNORMAL LOW (ref 12.0–15.0)
MCH: 28 pg (ref 26.0–34.0)
MCHC: 31.6 g/dL (ref 30.0–36.0)
MCV: 88.6 fL (ref 78.0–100.0)
Platelets: 354 10*3/uL (ref 150–400)
RBC: 4.11 MIL/uL (ref 3.87–5.11)
RDW: 14.1 % (ref 11.5–15.5)
WBC: 10.3 10*3/uL (ref 4.0–10.5)

## 2017-12-11 LAB — URINALYSIS, ROUTINE W REFLEX MICROSCOPIC
Bilirubin Urine: NEGATIVE
Glucose, UA: 150 mg/dL — AB
KETONES UR: NEGATIVE mg/dL
NITRITE: NEGATIVE
PH: 5 (ref 5.0–8.0)
Protein, ur: NEGATIVE mg/dL
SPECIFIC GRAVITY, URINE: 1.012 (ref 1.005–1.030)

## 2017-12-11 LAB — COMPREHENSIVE METABOLIC PANEL
ALBUMIN: 3.2 g/dL — AB (ref 3.5–5.0)
ALT: 14 U/L (ref 14–54)
AST: 16 U/L (ref 15–41)
Alkaline Phosphatase: 96 U/L (ref 38–126)
Anion gap: 9 (ref 5–15)
BILIRUBIN TOTAL: 0.3 mg/dL (ref 0.3–1.2)
BUN: 23 mg/dL — AB (ref 6–20)
CO2: 24 mmol/L (ref 22–32)
Calcium: 9 mg/dL (ref 8.9–10.3)
Chloride: 105 mmol/L (ref 101–111)
Creatinine, Ser: 1.77 mg/dL — ABNORMAL HIGH (ref 0.44–1.00)
GFR calc Af Amer: 31 mL/min — ABNORMAL LOW (ref >60)
GFR calc non Af Amer: 26 mL/min — ABNORMAL LOW (ref >60)
GLUCOSE: 404 mg/dL — AB (ref 65–99)
Potassium: 5 mmol/L (ref 3.5–5.1)
Sodium: 138 mmol/L (ref 135–145)
TOTAL PROTEIN: 7.2 g/dL (ref 6.5–8.1)

## 2017-12-11 LAB — LIPASE, BLOOD: Lipase: 26 U/L (ref 11–51)

## 2017-12-11 MED ORDER — INSULIN GLARGINE 100 UNIT/ML ~~LOC~~ SOLN
6.0000 [IU] | Freq: Once | SUBCUTANEOUS | Status: AC
  Administered 2017-12-11: 6 [IU] via SUBCUTANEOUS
  Filled 2017-12-11: qty 0.06

## 2017-12-11 MED ORDER — CEPHALEXIN 500 MG PO CAPS
500.0000 mg | ORAL_CAPSULE | Freq: Once | ORAL | Status: AC
  Administered 2017-12-11: 500 mg via ORAL
  Filled 2017-12-11: qty 1

## 2017-12-11 MED ORDER — CEPHALEXIN 500 MG PO CAPS: 500.0000 mg | ORAL_CAPSULE | Freq: Three times a day (TID) | ORAL | 0 refills | Status: DC

## 2017-12-11 NOTE — Discharge Instructions (Addendum)
Please return to the ER if your symptoms worsen; you have increased pain, fevers, chills, inability to keep any medications down, confusion. Otherwise see the outpatient doctor as requested.  

## 2017-12-11 NOTE — ED Triage Notes (Signed)
Pt having vague complaint of popping feeling in abdomen and bleeding from genital area uncertain from which area.

## 2017-12-11 NOTE — ED Provider Notes (Signed)
[facility] DEPT Provider Note   CSN: [id number] Arrival date & time: 12/11/17  0018     History   Chief Complaint Chief Complaint  Patient presents with  . Abdominal Pain    HPI Gail Lynch is a 77 y.o. female.  HPI  77 year old comes in with chief complaint of abdominal pain.  Patient has history of diabetes and she states that yesterday she started having some abdominal discomfort in the evening with leg popping feeling.  Patient also thinks that she had bloody urine afterwards which is why she decided to come to the ER.  At this time patient has no discomfort.  Patient denies any burning with urination or frequent urination or flank pain. Once that popping feeling resolved, patient did not have any belly pain again.  Past Medical History:  Diagnosis Date  . Arthritis    gout in feet  . Asthma    hx as a child - no inhaler, no problems  . Carotid stenosis 06/18/2012  . Colon polyps   . Diabetes mellitus    Type 2  . Dysplasia of cervix, low grade (CIN 1)    s/p cervical conization  . Glaucoma   . Glaucoma 12/18/2011  . Hard of hearing    hear aids - left  . HPV (human papilloma virus) infection   . Hyperlipidemia   . Hypertension   . Intrinsic asthma, unspecified 12/18/2011  . Mild aortic stenosis 03/14/2017  . Mycotic toenails 02/19/2013  . PONV (postoperative nausea and vomiting)   . SVD (spontaneous vaginal delivery)    x 4    Patient Active Problem List   Diagnosis Date Noted  . Mild aortic stenosis 03/14/2017  . Peripheral edema 03/14/2017  . Arm heaviness 03/14/2017  . CKD (chronic kidney disease), stage III ([name]) 05/20/2016  . Diabetes ([place]) 01/03/2016  . Dysuria 12/09/2014  . Peripheral neuropathy 12/05/2013  . Irregular heart beat 12/05/2013  . PVC (premature ventricular contraction) 12/05/2013  . Sciatica 06/17/2013  . Pain due to onychomycosis of toenail 05/21/2013  . Mycotic toenails   . Vagina bleeding  08/06/2012  . Carotid stenosis 06/18/2012  . Constipation 04/30/2012  . Childhood asthma 12/18/2011  . Glaucoma 12/18/2011  . Hyperlipidemia 12/18/2011  . Colon polyps 12/18/2011  . Preventative health care 12/17/2011  . Hypertension 12/17/2011  . Hard of hearing   . Dysplasia of cervix, low grade (CIN 1)   . HPV (human papilloma virus) infection   . Brain mass 09/18/2011    Past Surgical History:  Procedure Laterality Date  . BREAST BIOPSY  2010  . BREAST BIOPSY  1991  . COLONOSCOPY    . DILATION AND CURETTAGE OF UTERUS  2000  . HYSTEROSCOPY W/D&C N/A 03/04/2014   Procedure: DILATATION AND CURETTAGE /HYSTEROSCOPY;  Surgeon: Lavonia Drafts, MD;  Location: [facility];  Service: Gynecology;  Laterality: N/A;  . KNEE SURGERY  2010   patella fracture  . MULTIPLE TOOTH EXTRACTIONS    . POLYPECTOMY       OB History    Gravida  3   Para  3   Term  3   Preterm  0   AB  0   Living  3     SAB  0   TAB  0   Ectopic  0   Multiple  0   Live Births               Home Medications    Prior to  Admission medications   Medication Sig Start Date End Date Taking? Authorizing Provider  acetaminophen (TYLENOL) 500 MG tablet Take 1,000 mg by mouth every 6 (six) hours as needed for moderate pain.    Yes [provider]  atorvastatin (LIPITOR) 80 MG tablet Take 1 tablet (80 mg total) by mouth daily. 12/03/17  Yes Biagio Borg, MD  B Complex Vitamins (VITAMIN B COMPLEX) TABS Take 1 tablet by mouth daily.   Yes [provider]  Cholecalciferol (VITAMIN D3) 1000 UNITS CAPS Take 1,000 Units by mouth daily.    Yes [provider]  Flaxseed, Linseed, (FLAX SEED OIL) 1000 MG CAPS Take 2,000 mg by mouth daily.    Yes [provider]  Garlic 1607 MG CAPS Take 1,000 mg by mouth daily.    Yes [provider]  hydroxypropyl methylcellulose / hypromellose (ISOPTO TEARS / GONIOVISC) 2.5 % ophthalmic solution Place 1 drop into both eyes 3  (three) times daily as needed for dry eyes.   Yes [provider]  LANTUS 100 UNIT/ML injection ADMINISTER 80 [place] MORNING Patient taking differently: Inject 75 units under the skin every morning. 11/30/17  Yes Renato Shin, MD  polyethylene glycol [facility] / Floria Raveling) packet Take 17 g by mouth daily as needed for mild constipation.    Yes [provider]  valsartan-hydrochlorothiazide (DIOVAN-HCT) 320-12.5 MG tablet TAKE 1 TABLET BY MOUTH DAILY 11/05/17  Yes Biagio Borg, MD  allopurinol (ZYLOPRIM) 300 MG tablet Take 1 tablet (300 mg total) by mouth 2 (two) times daily. Must keep 11/15/16 appt for future refills Patient not taking: Reported on 12/11/2017 11/15/16   Biagio Borg, MD  cephALEXin (KEFLEX) 500 MG capsule Take 1 capsule (500 mg total) by mouth 3 (three) times daily. 12/11/17   Varney Biles, MD  glucose blood (ONE TOUCH ULTRA TEST) test strip 1 each by Other route 2 (two) times daily. And lancets 2/day 250.01 07/09/13   Renato Shin, MD  insulin NPH Human (HUMULIN N) 100 UNIT/ML injection Inject 0.8 mLs (80 Units total) into the skin every morning. And syringes 1/day Patient not taking: Reported on 12/11/2017 11/30/17   Renato Shin, MD  Insulin Syringe-Needle U-100 (INSULIN SYRINGE 1CC/31GX5/16") 31G X 5/16" 1 ML MISC USE ONCE DAILY 10/09/16   Renato Shin, MD  rosuvastatin (CRESTOR) 10 MG tablet Take 1 tablet (10 mg total) by mouth daily. Patient not taking: Reported on 12/11/2017 05/24/17   Biagio Borg, MD  valsartan-hydrochlorothiazide (DIOVAN-HCT) 320-25 MG tablet Take 1 tablet by mouth daily. Patient not taking: Reported on 12/11/2017 03/14/17   Biagio Borg, MD    Family History Family History  Problem Relation Age of Onset  . Diabetes Brother   . Heart disease Brother   . Heart disease Mother   . Heart disease Father   . Asthma Unknown   . Colon cancer Neg Hx   . Rectal cancer Neg Hx   . Stomach cancer Neg Hx   . Esophageal  cancer Neg Hx     Social History Social History   Tobacco Use  . Smoking status: Former Smoker    Types: Cigarettes    Last attempt to quit: 08/21/1980    Years since quitting: 37.3  . Smokeless tobacco: Former Network engineer Use Topics  . Alcohol use: No    Alcohol/week: 0.0 oz  . Drug use: No     Allergies   Aspirin and Penicillins   Review of Systems Review  of Systems  Constitutional: Positive for activity change.  Respiratory: Negative for shortness of breath.   Cardiovascular: Negative for chest pain.  Gastrointestinal: Positive for abdominal pain.  Genitourinary: Negative for dysuria and flank pain.  All other systems reviewed and are negative.    Physical Exam Updated Vital Signs BP (!) 187/57 (BP Location: Right Arm)   Pulse 73   Temp 98.7 F (37.1 C) (Oral)   Resp 15   Ht 5' (1.524 m)   Wt 99.8 kg (220 lb)   SpO2 100%   BMI 42.97 kg/m   Physical Exam  Constitutional: She is oriented to person, place, and time. She appears well-developed.  HENT:  Head: Normocephalic and atraumatic.  Eyes: EOM are normal.  Neck: Normal range of motion. Neck supple.  Cardiovascular: Normal rate.  Pulmonary/Chest: Effort normal.  Abdominal: Bowel sounds are normal. She exhibits no ascites. There is no tenderness.  Neurological: She is alert and oriented to person, place, and time.  Skin: Skin is warm and dry.  Nursing note and vitals reviewed.    ED Treatments / Results  Labs (all labs ordered are listed, but only abnormal results are displayed) Labs Reviewed  COMPREHENSIVE METABOLIC PANEL - Abnormal; Notable for the following components:      Result Value   Glucose, Bld 404 (*)    BUN 23 (*)    Creatinine, Ser 1.77 (*)    Albumin 3.2 (*)    GFR calc non Af Amer 26 (*)    GFR calc Af Amer 31 (*)    All other components within normal limits  CBC - Abnormal; Notable for the following components:   Hemoglobin 11.5 (*)    All other components within normal  limits  URINALYSIS, ROUTINE W REFLEX MICROSCOPIC - Abnormal; Notable for the following components:   APPearance CLOUDY (*)    Glucose, UA 150 (*)    Hgb urine dipstick LARGE (*)    Leukocytes, UA LARGE (*)    Bacteria, UA MANY (*)    All other components within normal limits  LIPASE, BLOOD    EKG None  Radiology No results found.  Procedures Procedures (including critical care time)  Medications Ordered in ED Medications  cephALEXin (KEFLEX) capsule 500 mg (has no administration in time range)  insulin glargine (LANTUS) injection 6 Units (6 Units Subcutaneous Given 12/11/17 7342)     Initial Impression / Assessment and Plan / ED Course  I have reviewed the triage vital signs and the nursing notes.  Pertinent labs & imaging results that were available during my care of the patient were reviewed by me and considered in my medical decision making (see chart for details).     DDx includes: Pancreatitis Hepatobiliary pathology including cholecystitis Gastritis/PUD SBO UTI Kidney stone  77 year old comes in with chief complaint of abdominal discomfort and bloody urine.  Patient had vague discomfort described as popping sensation yesterday evening followed by bloody urine.  Patient has diabetes.  She does not have any significant medical or surgical history outside of that.  On exam patient has no abdominal tenderness at all and there is no clear evidence of hernia.  UA shows positive bacteria and WBCs.  We also see hemoglobin/RBCs in the urine, consistent with patient's complaint of gross hematuria.  Patient's blood glucose is slightly elevated as well.  We will treat patient as if she might have had a UTI.  Suspicion for clinically significant renal stone is not present, therefore we will not be getting  a CT scan.  Results of the ER workup discussed with the patient and she has been advised to come back to the ED if her symptoms get worse.     Final Clinical  Impressions(s) / ED Diagnoses   Final diagnoses:  Lower urinary tract infectious disease    ED Discharge Orders        Ordered    cephALEXin (KEFLEX) 500 MG capsule  3 times daily     12/11/17 [place], Raylei Losurdo, MD 12/11/17 [id number]

## 2017-12-11 NOTE — ED Notes (Signed)
Patient given water for her PO/fluid challenge.

## 2017-12-11 NOTE — ED Triage Notes (Signed)
Pt denies any pain in abdomen at this time.

## 2017-12-19 DIAGNOSIS — H359 Unspecified retinal disorder: Secondary | ICD-10-CM | POA: Diagnosis not present

## 2017-12-19 DIAGNOSIS — E113411 Type 2 diabetes mellitus with severe nonproliferative diabetic retinopathy with macular edema, right eye: Secondary | ICD-10-CM | POA: Diagnosis not present

## 2017-12-19 DIAGNOSIS — H402230 Chronic angle-closure glaucoma, bilateral, stage unspecified: Secondary | ICD-10-CM | POA: Diagnosis not present

## 2017-12-19 DIAGNOSIS — H3589 Other specified retinal disorders: Secondary | ICD-10-CM | POA: Diagnosis not present

## 2017-12-19 DIAGNOSIS — E113391 Type 2 diabetes mellitus with moderate nonproliferative diabetic retinopathy without macular edema, right eye: Secondary | ICD-10-CM | POA: Diagnosis not present

## 2017-12-19 LAB — HM DIABETES EYE EXAM

## 2017-12-20 ENCOUNTER — Encounter: Payer: Self-pay | Admitting: Dietician

## 2017-12-20 ENCOUNTER — Encounter: Payer: Medicare Other | Attending: Endocrinology | Admitting: Dietician

## 2017-12-20 DIAGNOSIS — Z794 Long term (current) use of insulin: Secondary | ICD-10-CM | POA: Insufficient documentation

## 2017-12-20 DIAGNOSIS — Z713 Dietary counseling and surveillance: Secondary | ICD-10-CM | POA: Diagnosis not present

## 2017-12-20 DIAGNOSIS — E11319 Type 2 diabetes mellitus with unspecified diabetic retinopathy without macular edema: Secondary | ICD-10-CM | POA: Insufficient documentation

## 2017-12-20 NOTE — Patient Instructions (Signed)
Breakfast, lunch, and dinner daily.  Avoid skipping breakfast. Be sure to have carbohydrate with each meal  Improve the nutrition quality of your diet.  1/2 your plate should be non-starchy vegetables  1 fruit with each meal  Have protein with each meal (beans, peanut butter, chicken, fish). A Small snack if you are hungry  Read the label for the serving size. Enjoy away from the TV.   Choose crackers and popcorn with lower amounts of sodium.

## 2017-12-20 NOTE — Progress Notes (Signed)
Diabetes Self-Management Education  Visit Type: First/Initial  Appt. Start Time: 0900 Appt. End Time: 2440  12/20/2017  [name], identified by name and date of birth, is a 77 y.o. female with a diagnosis of Diabetes: Type 2. Other history includes retinopathy, Hyperlipidemia, HTN, and CDE with GFR of 31 12/11/17.  Last A1C 8.8%. Medications include Lantus 70-75 units q am.  Patient states that she was intructed to take 80 units but her blood sugars drop too low.  She is no longer taking the NPH.   Referral of Dr. Loanne Drilling due to patient's low blood sugar in the am with request to increase her am intake.  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   She is lactose intolerant and avoids dairy except yogurt.  She will eat eggs occasionally but dislikes them often.  She will eat canned chicken but dislikes other meat and occasionally eats fried fish.  She will juice vegetables at times.  She complains of constipation and discussed the need for the fiber from the vegetables.  Her diet overall is poor quality  She skips breakfast often yet this is the time of day that she tends to run low blood sugars.  She at times, rather than making a meal will eat 3 yogurts or 3 bowls of sugar free jello with coolwhip and fruit or just eat popcorn or cheeze-its crackers.  She avoids added salt with intake <1/8 tsp daily.  ASSESSMENT  Height 5\' 3"  (1.6 m), weight 221 lb (100.2 kg). Body mass index is 39.15 kg/m.  Diabetes Self-Management Education - 12/20/17 0919      Visit Information   Visit Type  First/Initial      Initial Visit   Diabetes Type  Type 2    Are you currently following a meal plan?  No    Are you taking your medications as prescribed?  Yes    Date Diagnosed  1990      Health Coping   How would you rate your overall health?  Excellent      Psychosocial Assessment   Patient Belief/Attitude about Diabetes  Motivated to manage diabetes    Self-care barriers  None    Self-management support  Doctor's office    Other persons present  Patient    Patient Concerns  Nutrition/Meal planning;Weight Control;Glycemic Control;Problem Solving;Other (comment) meal planning especially breakfast    Special Needs  None    Preferred Learning Style  No preference indicated    Learning Readiness  Ready    How often do you need to have someone help you when you read instructions, pamphlets, or other written materials from your doctor or pharmacy?  1 - Never    What is the last grade level you completed in school?  12th grade      Pre-Education Assessment   Patient understands the diabetes disease and treatment process.  Needs Review    Patient understands incorporating nutritional management into lifestyle.  Needs Review    Patient undertands incorporating physical activity into lifestyle.  Needs Review    Patient understands using medications safely.  Needs Review    Patient understands monitoring blood glucose, interpreting and using results  Needs Review    Patient understands prevention, detection, and treatment of acute complications.  Needs Review    Patient understands prevention, detection, and treatment of chronic complications.  Needs Review    Patient understands how to develop strategies to address psychosocial issues.  Needs Review  Patient understands how to develop strategies to promote health/change behavior.  Needs Review      Complications   Last HgB A1C per patient/outside source  8.8 % 12/01/17    How often do you check your blood sugar?  1-2 times/day    Fasting Blood glucose range (mg/dL)  <70;70-129    Postprandial Blood glucose range (mg/dL)  180-200;130-179;>200    Number of hypoglycemic episodes per month  4    Can you tell when your blood sugar is low?  Yes    What do you do if your blood sugar is low?  eats food or drinks something with sugar    Number of hyperglycemic episodes per week  2    Can you tell when your blood sugar is high?  No     Have you had a dilated eye exam in the past 12 months?  Yes    Have you had a dental exam in the past 12 months?  No dentures    Are you checking your feet?  Yes    How many days per week are you checking your feet?  7      Dietary Intake   Breakfast  Homemade oatmeal, raisins, milk, margarine OR leftover chicken and dumplings OR SKIPS OR OJ if blood sugar is low or eats 3-4 mints OR cheeze it crackers before noon    Snack (morning)  none    Lunch  leftovers OR cheeze it crackers 12 or later if she eats breakfast    Snack (afternoon)  occasional popcorn    Dinner  cheeze its, grapes or apple OR canned chicken and occasional egg on salad OR occasional fried fish OR greens only before 7 pm    Snack (evening)  occasional cheeze it crackers      Exercise   Exercise Type  ADL's housework      Patient Education   Previous Diabetes Education  No    Disease state   Other (comment) review of type 2 diabetes, insulin resistance, progression    Nutrition management   Role of diet in the treatment of diabetes and the relationship between the three main macronutrients and blood glucose level;Food label reading, portion sizes and measuring food.;Meal timing in regards to the patients' current diabetes medication.;Meal options for control of blood glucose level and chronic complications.    Physical activity and exercise   Role of exercise on diabetes management, blood pressure control and cardiac health.;Helped patient identify appropriate exercises in relation to his/her diabetes, diabetes complications and other health issue.    Medications  Reviewed patients medication for diabetes, action, purpose, timing of dose and side effects.    Monitoring  Identified appropriate SMBG and/or A1C goals.;Daily foot exams;Yearly dilated eye exam    Acute complications  Taught treatment of hypoglycemia - the 15 rule.    Psychosocial adjustment  Worked with patient to identify barriers to care and solutions;Role of  stress on diabetes      Individualized Goals (developed by patient)   Nutrition  General guidelines for healthy choices and portions discussed    Physical Activity  Exercise 3-5 times per week;15 minutes per day    Medications  take my medication as prescribed    Monitoring   test my blood glucose as discussed    Reducing Risk  examine blood glucose patterns;Other (comment) balance meals    Health Coping  discuss diabetes with (comment) MD, RD, CDE      Post-Education Assessment  Patient understands the diabetes disease and treatment process.  Demonstrates understanding / competency    Patient understands incorporating nutritional management into lifestyle.  Needs Review    Patient undertands incorporating physical activity into lifestyle.  Demonstrates understanding / competency    Patient understands using medications safely.  Demonstrates understanding / competency    Patient understands monitoring blood glucose, interpreting and using results  Demonstrates understanding / competency    Patient understands prevention, detection, and treatment of acute complications.  Demonstrates understanding / competency    Patient understands prevention, detection, and treatment of chronic complications.  Demonstrates understanding / competency    Patient understands how to develop strategies to address psychosocial issues.  Demonstrates understanding / competency    Patient understands how to develop strategies to promote health/change behavior.  Needs Review      Outcomes   Expected Outcomes  Demonstrated interest in learning. Expect positive outcomes    Future DMSE  2 months    Program Status  Not Completed       Individualized Plan for Diabetes Self-Management Training:   Learning Objective:  Patient will have a greater understanding of diabetes self-management. Patient education plan is to attend individual and/or group sessions per assessed needs and concerns.   Plan:   Patient  Instructions  Breakfast, lunch, and dinner daily.  Avoid skipping breakfast. Be sure to have carbohydrate with each meal  Improve the nutrition quality of your diet.  1/2 your plate should be non-starchy vegetables  1 fruit with each meal  Have protein with each meal (beans, peanut butter, chicken, fish). A Small snack if you are hungry  Read the label for the serving size. Enjoy away from the TV.   Choose crackers and popcorn with lower amounts of sodium.   Expected Outcomes:  Demonstrated interest in learning. Expect positive outcomes  Education material provided: Food label handouts, A1C conversion sheet, Meal plan card, My Plate and Snack sheet, plant based sample meal plans, 1200 calorie sample meal plans.  ADA Diabetes Your Take control guide  If problems or questions, patient to contact team via:  Phone  Future DSME appointment: 2 months

## 2018-01-01 ENCOUNTER — Encounter (INDEPENDENT_AMBULATORY_CARE_PROVIDER_SITE_OTHER): Payer: Medicare Other | Admitting: Ophthalmology

## 2018-01-01 DIAGNOSIS — I10 Essential (primary) hypertension: Secondary | ICD-10-CM

## 2018-01-01 DIAGNOSIS — E113313 Type 2 diabetes mellitus with moderate nonproliferative diabetic retinopathy with macular edema, bilateral: Secondary | ICD-10-CM

## 2018-01-01 DIAGNOSIS — H35033 Hypertensive retinopathy, bilateral: Secondary | ICD-10-CM | POA: Diagnosis not present

## 2018-01-01 DIAGNOSIS — E11311 Type 2 diabetes mellitus with unspecified diabetic retinopathy with macular edema: Secondary | ICD-10-CM | POA: Diagnosis not present

## 2018-01-01 DIAGNOSIS — H43813 Vitreous degeneration, bilateral: Secondary | ICD-10-CM

## 2018-01-02 ENCOUNTER — Ambulatory Visit: Payer: Medicare Other | Admitting: Podiatry

## 2018-01-05 ENCOUNTER — Other Ambulatory Visit: Payer: Self-pay | Admitting: Endocrinology

## 2018-01-09 ENCOUNTER — Telehealth: Payer: Self-pay

## 2018-01-09 NOTE — Telephone Encounter (Signed)
Copied from [place] [id number]. Topic: Referral - Question >> Jan 09, 2018 10:13 AM Conception Chancy, NT wrote: Reason for CRM: Levada Dy is calling from [facility] and states the patient just had a eye exam on 12/19/17 and would like to know if there is something specific why Dr. Jenny Reichmann is wanting her to have another one. Please contact.  [phone] ext [phone]

## 2018-01-09 NOTE — Telephone Encounter (Signed)
No, this would not be needed unless she was felt appropriate for need for f/u visit per optho

## 2018-01-10 NOTE — Telephone Encounter (Signed)
Angela informed

## 2018-01-28 ENCOUNTER — Encounter (INDEPENDENT_AMBULATORY_CARE_PROVIDER_SITE_OTHER): Payer: Medicare Other | Admitting: Ophthalmology

## 2018-01-28 DIAGNOSIS — H43813 Vitreous degeneration, bilateral: Secondary | ICD-10-CM

## 2018-01-28 DIAGNOSIS — H35033 Hypertensive retinopathy, bilateral: Secondary | ICD-10-CM | POA: Diagnosis not present

## 2018-01-28 DIAGNOSIS — E113313 Type 2 diabetes mellitus with moderate nonproliferative diabetic retinopathy with macular edema, bilateral: Secondary | ICD-10-CM | POA: Diagnosis not present

## 2018-01-28 DIAGNOSIS — E11311 Type 2 diabetes mellitus with unspecified diabetic retinopathy with macular edema: Secondary | ICD-10-CM | POA: Diagnosis not present

## 2018-01-28 DIAGNOSIS — I10 Essential (primary) hypertension: Secondary | ICD-10-CM

## 2018-02-01 ENCOUNTER — Encounter: Payer: Self-pay | Admitting: Podiatry

## 2018-02-01 ENCOUNTER — Telehealth: Payer: Self-pay | Admitting: Internal Medicine

## 2018-02-01 ENCOUNTER — Other Ambulatory Visit: Payer: Self-pay | Admitting: Internal Medicine

## 2018-02-01 ENCOUNTER — Ambulatory Visit: Payer: Medicare Other | Admitting: Podiatry

## 2018-02-01 DIAGNOSIS — E1151 Type 2 diabetes mellitus with diabetic peripheral angiopathy without gangrene: Secondary | ICD-10-CM

## 2018-02-01 DIAGNOSIS — Q6650 Congenital pes planus, unspecified foot: Secondary | ICD-10-CM

## 2018-02-01 DIAGNOSIS — M79676 Pain in unspecified toe(s): Secondary | ICD-10-CM | POA: Diagnosis not present

## 2018-02-01 DIAGNOSIS — B351 Tinea unguium: Secondary | ICD-10-CM

## 2018-02-01 MED ORDER — VALSARTAN-HYDROCHLOROTHIAZIDE 320-12.5 MG PO TABS: 1.0000 | ORAL_TABLET | Freq: Every day | ORAL | 3 refills | Status: DC

## 2018-02-01 MED ORDER — ATORVASTATIN CALCIUM 40 MG PO TABS: ORAL_TABLET | ORAL | 3 refills | Status: DC

## 2018-02-01 NOTE — Telephone Encounter (Signed)
Copied from [place] [id number]. Topic: Quick Communication - See Telephone Encounter >> Feb 01, 2018 11:12 AM Rutherford Nail, NT wrote: CRM for notification. See Telephone encounter for: 02/01/18. Patient calling and states that she would like all current medications and refills be sent to [facility], [place] - [address] CB#: [phone]

## 2018-02-01 NOTE — Progress Notes (Signed)
Patient ID: Gail Lynch, female   DOB: 12/18/1940, 77 y.o.   MRN: [id number] Complaint:  Visit Type: Patient returns to my office for continued preventative foot care services. Complaint: Patient states" my nails have grown long and thick and become painful to walk and wear shoes" Patient has been diagnosed with DM with neuropathy and angiopathy.. The patient presents for preventative foot care services. No changes to ROS  Podiatric Exam: Vascular: dorsalis pedis and posterior tibial pulses are not  palpable bilateral. Capillary return is immediate. Temperature gradient is WNL. Skin turgor WNL  Sensorium: Normal Semmes Weinstein monofilament test. Normal tactile sensation bilaterally. Nail Exam: Pt has thick disfigured discolored nails with subungual debris noted bilateral entire nail hallux through fifth toenails Ulcer Exam: There is no evidence of ulcer or pre-ulcerative changes or infection. Orthopedic Exam: Muscle tone and strength are WNL. No limitations in general ROM. No crepitus or effusions noted. Foot type and digits show no abnormalities. Bony prominences are unremarkable. Pes planus Skin: No Porokeratosis. No infection or ulcers.  Callus right heel,  Asymptomatic..  Diagnosis:  Onychomycosis, , Pain in right toe, pain in left toes  Treatment & Plan Procedures and Treatment: Consent by patient was obtained for treatment procedures. The patient understood the discussion of treatment and procedures well. All questions were answered thoroughly reviewed. Debridement of mycotic and hypertrophic toenails, 1 through 5 bilateral and clearing of subungual debris. No ulceration, no infection noted.  Return Visit-Office Procedure: Patient instructed to return to the office for a follow up visit 3 months for continued evaluation and treatment.   Bexleigh Theriault DPM    

## 2018-02-13 DIAGNOSIS — Z794 Long term (current) use of insulin: Secondary | ICD-10-CM | POA: Diagnosis not present

## 2018-02-13 DIAGNOSIS — E119 Type 2 diabetes mellitus without complications: Secondary | ICD-10-CM | POA: Diagnosis not present

## 2018-02-20 ENCOUNTER — Other Ambulatory Visit: Payer: Self-pay | Admitting: Endocrinology

## 2018-02-25 ENCOUNTER — Encounter (INDEPENDENT_AMBULATORY_CARE_PROVIDER_SITE_OTHER): Payer: Medicare Other | Admitting: Ophthalmology

## 2018-03-03 ENCOUNTER — Other Ambulatory Visit: Payer: Self-pay | Admitting: Internal Medicine

## 2018-03-04 ENCOUNTER — Telehealth: Payer: Self-pay | Admitting: Endocrinology

## 2018-03-04 ENCOUNTER — Other Ambulatory Visit: Payer: Self-pay

## 2018-03-04 MED ORDER — INSULIN GLARGINE 100 UNIT/ML ~~LOC~~ SOLN: SUBCUTANEOUS | 3 refills | Status: DC

## 2018-03-04 NOTE — Telephone Encounter (Signed)
Patient stated that she is needing her Insulin(Lantus) sent into the pharmacy     [place], [place] - [address]

## 2018-03-08 ENCOUNTER — Ambulatory Visit (INDEPENDENT_AMBULATORY_CARE_PROVIDER_SITE_OTHER): Payer: Medicare Other | Admitting: Endocrinology

## 2018-03-08 ENCOUNTER — Encounter: Payer: Self-pay | Admitting: Endocrinology

## 2018-03-08 ENCOUNTER — Encounter: Payer: Medicare Other | Attending: Endocrinology | Admitting: Dietician

## 2018-03-08 ENCOUNTER — Encounter: Payer: Self-pay | Admitting: Dietician

## 2018-03-08 VITALS — BP 122/62 | HR 84 | Wt 219.2 lb

## 2018-03-08 DIAGNOSIS — Z794 Long term (current) use of insulin: Secondary | ICD-10-CM | POA: Insufficient documentation

## 2018-03-08 DIAGNOSIS — E11319 Type 2 diabetes mellitus with unspecified diabetic retinopathy without macular edema: Secondary | ICD-10-CM

## 2018-03-08 DIAGNOSIS — Z713 Dietary counseling and surveillance: Secondary | ICD-10-CM | POA: Diagnosis not present

## 2018-03-08 LAB — POCT GLYCOSYLATED HEMOGLOBIN (HGB A1C): Hemoglobin A1C: 7.7 % — AB (ref 4.0–5.6)

## 2018-03-08 MED ORDER — INSULIN GLARGINE 100 UNIT/ML ~~LOC~~ SOLN: 75.0000 [IU] | SUBCUTANEOUS | 3 refills | Status: DC

## 2018-03-08 NOTE — Progress Notes (Signed)
Diabetes Self-Management Education  Visit Type: Follow-up  Appt. Start Time: 1100 Appt. End Time: 1130  03/08/2018  [name], identified by name and date of birth, is a 77 y.o. female with a diagnosis of Diabetes: Type 2. Other history includes retinopathy, Hyperlipidemia, HTN, and CDE with GFR of 31 12/11/17.  Last A1C 8.8%.  Since last visit, her A1C has decreased to 7.7% 03/08/18.  She states that she now understands how to eat and that her blood sugar is better controlled.  She still skips meals as she states that she is not very hungry.   Discussed importance of a regular meal schedule and the importance of this for proper nutrition and to prevent lows. Medications include Lantus 75 units each am.  Patient is a retired Chemical engineer.  She lives alone.  She prefers vegetables and juices vegetables at times.  She sleeps late which interferes with meal schedule.  ASSESSMENT  There were no vitals taken for this visit. There is no height or weight on file to calculate BMI.  Diabetes Self-Management Education - 03/08/18 1146      Visit Information   Visit Type  Follow-up      Initial Visit   Diabetes Type  Type 2    Are you currently following a meal plan?  Yes    Are you taking your medications as prescribed?  Yes      Psychosocial Assessment   Patient Belief/Attitude about Diabetes  Motivated to manage diabetes    Self-care barriers  None;Hard of hearing    Self-management support  Doctor's office    Other persons present  Patient    Patient Concerns  Nutrition/Meal planning;Glycemic Control;Weight Control    Special Needs  None    Preferred Learning Style  No preference indicated    Learning Readiness  Ready    How often do you need to have someone help you when you read instructions, pamphlets, or other written materials from your doctor or pharmacy?  1 - Never    What is the last grade level you completed in school?  12th grade      Pre-Education Assessment   Patient understands the diabetes disease and treatment process.  Demonstrates understanding / competency    Patient understands incorporating nutritional management into lifestyle.  Needs Review    Patient undertands incorporating physical activity into lifestyle.  Demonstrates understanding / competency    Patient understands using medications safely.  Demonstrates understanding / competency    Patient understands monitoring blood glucose, interpreting and using results  Demonstrates understanding / competency    Patient understands prevention, detection, and treatment of acute complications.  Demonstrates understanding / competency    Patient understands prevention, detection, and treatment of chronic complications.  Demonstrates understanding / competency    Patient understands how to develop strategies to address psychosocial issues.  Demonstrates understanding / competency    Patient understands how to develop strategies to promote health/change behavior.  Demonstrates understanding / competency      Complications   Last HgB A1C per patient/outside source  7.7 % 03/08/18 decreased    How often do you check your blood sugar?  1-2 times/day    Fasting Blood glucose range (mg/dL)  70-129;130-179    Postprandial Blood glucose range (mg/dL)  130-179;180-200    Number of hypoglycemic episodes per month  0    Can you tell when your blood sugar is low?  Yes      Dietary Intake   Breakfast  Homemade oatmeal, raisins, milk, margarine OR leftovers OR SKIPS if wakes up late    Lunch  Leftovers or sandwich and fruit OR SKIPS    Dinner  vegetables or chicken or fish, slaw, mac and cheese, cornbread     Snack (evening)  occasional crackers, nuts    Beverage(s)  water, V-8 splash      Exercise   Exercise Type  ADL's      Patient Education   Previous Diabetes Education  Yes (please comment) 2 months ago    Nutrition management   Meal timing in regards to the patients' current diabetes  medication.;Meal options for control of blood glucose level and chronic complications.    Physical activity and exercise   Helped patient identify appropriate exercises in relation to his/her diabetes, diabetes complications and other health issue.;Role of exercise on diabetes management, blood pressure control and cardiac health.    Medications  Reviewed patients medication for diabetes, action, purpose, timing of dose and side effects.    Monitoring  Purpose and frequency of SMBG.      Individualized Goals (developed by patient)   Nutrition  General guidelines for healthy choices and portions discussed    Physical Activity  Exercise 3-5 times per week;15 minutes per day    Medications  take my medication as prescribed    Monitoring   test my blood glucose as discussed    Reducing Risk  examine blood glucose patterns    Health Coping  discuss diabetes with (comment) MD, RD, CDE      Patient Self-Evaluation of Goals - Patient rates self as meeting previously set goals (% of time)   Nutrition  >75%    Physical Activity  25 - 50%    Medications  >75%    Monitoring  >75%    Problem Solving  >75%    Reducing Risk  >75%    Health Coping  >75%      Post-Education Assessment   Patient understands the diabetes disease and treatment process.  Demonstrates understanding / competency    Patient understands incorporating nutritional management into lifestyle.  Needs Review    Patient undertands incorporating physical activity into lifestyle.  Demonstrates understanding / competency    Patient understands using medications safely.  Demonstrates understanding / competency    Patient understands monitoring blood glucose, interpreting and using results  Demonstrates understanding / competency    Patient understands prevention, detection, and treatment of acute complications.  Demonstrates understanding / competency    Patient understands prevention, detection, and treatment of chronic complications.   Demonstrates understanding / competency    Patient understands how to develop strategies to address psychosocial issues.  Demonstrates understanding / competency    Patient understands how to develop strategies to promote health/change behavior.  Needs Review      Outcomes   Expected Outcomes  Demonstrated interest in learning. Expect positive outcomes    Future DMSE  3-4 months    Program Status  Not Completed      Subsequent Visit   Since your last visit have you continued or begun to take your medications as prescribed?  Yes    Since your last visit have you had your blood pressure checked?  Yes    Since your last visit have you experienced any weight changes?  Loss    Weight Loss (lbs)  2    Since your last visit, are you checking your blood glucose at least once a day?  Yes  Individualized Plan for Diabetes Self-Management Training:   Learning Objective:  Patient will have a greater understanding of diabetes self-management. Patient education plan is to attend individual and/or group sessions per assessed needs and concerns.   Plan:   Patient Instructions  Continue to use the My plate as a guide for balanced meals.  Small Breakfast, Lunch, and Dinner daily.  Continue to eat your vegetables. Stay active every day. Continue to check your blood sugar  Continue to take your medication as prescribed.   Expected Outcomes:  Demonstrated interest in learning. Expect positive outcomes  Education material provided:   If problems or questions, patient to contact team via:  Phone  Future DSME appointment: 3-4 months

## 2018-03-08 NOTE — Progress Notes (Signed)
Subjective:    Patient ID: Gail Lynch, female    DOB: 1940/11/30, 77 y.o.   MRN: [id number]  HPI Pt returns for f/u of diabetes mellitus: DM type: Insulin-requiring type 2. Dx'ed: 8527 Complications: polyneuropathy, renal insuff, and retinopathy.   Therapy: insulin since soon after dx.    GDM: never. DKA: never Severe hypoglycemia: never.   Pancreatitis: never.   Other: she declines multiple daily injections; she declines to increase frequency of injections; she changed from lantus to NPH, due to pattern of cbg's.   Interval history: she brings a record of her cbg's which I have reviewed today.  It varies from 89-187. It is in general higher as the day goes on.  pt states she feels well in general.  She says she never misses the insulin, but she takes just 75 units qam.   Past Medical History:  Diagnosis Date  . Arthritis    gout in feet  . Asthma    hx as a child - no inhaler, no problems  . Carotid stenosis 06/18/2012  . Colon polyps   . Diabetes mellitus    Type 2  . Dysplasia of cervix, low grade (CIN 1)    s/p cervical conization  . Glaucoma   . Glaucoma 12/18/2011  . Hard of hearing    hear aids - left  . HPV (human papilloma virus) infection   . Hyperlipidemia   . Hypertension   . Intrinsic asthma, unspecified 12/18/2011  . Mild aortic stenosis 03/14/2017  . Mycotic toenails 02/19/2013  . PONV (postoperative nausea and vomiting)   . SVD (spontaneous vaginal delivery)    x 4    Past Surgical History:  Procedure Laterality Date  . BREAST BIOPSY  2010  . BREAST BIOPSY  1991  . COLONOSCOPY    . DILATION AND CURETTAGE OF UTERUS  2000  . HYSTEROSCOPY W/D&C N/A 03/04/2014   Procedure: DILATATION AND CURETTAGE /HYSTEROSCOPY;  Surgeon: Lavonia Drafts, MD;  Location: [facility];  Service: Gynecology;  Laterality: N/A;  . KNEE SURGERY  2010   patella fracture  . MULTIPLE TOOTH EXTRACTIONS    . POLYPECTOMY      Social History   Socioeconomic History  .  Marital status: Widowed    Spouse name: Not on file  . Number of children: 3  . Years of education: Not on file  . Highest education level: Not on file  Occupational History  . Occupation: retired  Scientific laboratory technician  . Financial resource strain: Not on file  . Food insecurity:    Worry: Not on file    Inability: Not on file  . Transportation needs:    Medical: Not on file    Non-medical: Not on file  Tobacco Use  . Smoking status: Former Smoker    Types: Cigarettes    Last attempt to quit: 08/21/1980    Years since quitting: 37.5  . Smokeless tobacco: Former Network engineer and Sexual Activity  . Alcohol use: No    Alcohol/week: 0.0 oz  . Drug use: No  . Sexual activity: Yes    Birth control/protection: Post-menopausal  Lifestyle  . Physical activity:    Days per week: Not on file    Minutes per session: Not on file  . Stress: Not on file  Relationships  . Social connections:    Talks on phone: Not on file    Gets together: Not on file    Attends religious service: Not on file  Active member of club or organization: Not on file    Attends meetings of clubs or organizations: Not on file    Relationship status: Not on file  . Intimate partner violence:    Fear of current or ex partner: Not on file    Emotionally abused: Not on file    Physically abused: Not on file    Forced sexual activity: Not on file  Other Topics Concern  . Not on file  Social History Narrative  . Not on file    Current Outpatient Medications on File Prior to Visit  Medication Sig Dispense Refill  . acetaminophen (TYLENOL) 500 MG tablet Take 1,000 mg by mouth every 6 (six) hours as needed for moderate pain.     . B Complex Vitamins (VITAMIN B COMPLEX) TABS Take 1 tablet by mouth daily.    . Cholecalciferol (VITAMIN D3) 1000 UNITS CAPS Take 1,000 Units by mouth daily.     . Flaxseed, Linseed, (FLAX SEED OIL) 1000 MG CAPS Take 2,000 mg by mouth daily.     . Garlic 8889 MG CAPS Take 1,000 mg by  mouth daily.     Marland Kitchen glucose blood (ONE TOUCH ULTRA TEST) test strip 1 each by Other route 2 (two) times daily. And lancets 2/day 250.01 180 each 3  . hydroxypropyl methylcellulose / hypromellose (ISOPTO TEARS / GONIOVISC) 2.5 % ophthalmic solution Place 1 drop into both eyes 3 (three) times daily as needed for dry eyes.    . Insulin Syringe-Needle U-100 (INSULIN SYRINGE 1CC/31GX5/16") 31G X 5/16" 1 ML MISC USE ONCE DAILY 100 each 0  . polyethylene glycol (MIRALAX / GLYCOLAX) packet Take 17 g by mouth daily as needed for mild constipation.     . rosuvastatin (CRESTOR) 10 MG tablet Take 1 tablet (10 mg total) by mouth daily. 90 tablet 3  . valsartan-hydrochlorothiazide (DIOVAN-HCT) 320-12.5 MG tablet Take 1 tablet by mouth daily. 90 tablet 3  . valsartan-hydrochlorothiazide (DIOVAN-HCT) 320-25 MG tablet TAKE 1 TABLET BY MOUTH DAILY 90 tablet 1  . vitamin E 400 UNIT capsule Take 400 Units by mouth daily.    Marland Kitchen allopurinol (ZYLOPRIM) 300 MG tablet Take 1 tablet (300 mg total) by mouth 2 (two) times daily. Must keep 11/15/16 appt for future refills (Patient not taking: Reported on 12/11/2017) 90 tablet 3  . atorvastatin (LIPITOR) 40 MG tablet TAKE 1 TABLET(40 MG) BY MOUTH DAILY (Patient not taking: Reported on 03/08/2018) 90 tablet 3  . atorvastatin (LIPITOR) 80 MG tablet Take 1 tablet (80 mg total) by mouth daily. (Patient not taking: Reported on 12/20/2017) 90 tablet 3  . cephALEXin (KEFLEX) 500 MG capsule Take 1 capsule (500 mg total) by mouth 3 (three) times daily. (Patient not taking: Reported on 12/20/2017) 15 capsule 0  . insulin NPH Human (HUMULIN N) 100 UNIT/ML injection Inject 0.8 mLs (80 Units total) into the skin every morning. And syringes 1/day (Patient not taking: Reported on 12/11/2017) 30 mL 11   No current facility-administered medications on file prior to visit.     Allergies  Allergen Reactions  . Aspirin Other (See Comments)    BLEEDING BEHIND EYES  . Penicillins Other (See Comments)     unknown    Family History  Problem Relation Age of Onset  . Diabetes Brother   . Heart disease Brother   . Heart disease Mother   . Heart disease Father   . Asthma Unknown   . Colon cancer Neg Hx   . Rectal cancer Neg  Hx   . Stomach cancer Neg Hx   . Esophageal cancer Neg Hx     BP 122/62 (BP Location: Left Arm, Patient Position: Sitting, Cuff Size: Normal)   Pulse 84   Wt 219 lb 3.2 oz (99.4 kg)   SpO2 94%   BMI 38.83 kg/m    Review of Systems She denies hypoglycemia    Objective:   Physical Exam VITAL SIGNS:  See vs page.  GENERAL: no distress. Pulses: foot pulses are intact bilaterally.   MSK: no deformity of the feet or ankles.  CV: 1+ bilat edema of the legs Skin:  no ulcer on the feet or ankles.  normal color and temp on the feet and ankles Neuro: sensation is intact to touch on the feet and ankles.  Ext: There is bilateral onychomycosis of the toenails.    Lab Results  Component Value Date   CREATININE 1.77 (H) 12/11/2017   BUN 23 (H) 12/11/2017   NA 138 12/11/2017   K 5.0 12/11/2017   CL 105 12/11/2017   CO2 24 12/11/2017   Lab Results  Component Value Date   HGBA1C 7.7 (A) 03/08/2018        Assessment & Plan:  Insulin-requiring type 2 DM, with renal insuff: this is the best control this pt should aim for, given this regimen, which does match insulin to her changing needs throughout the day.   Patient Instructions  check your blood sugar twice a day.  vary the time of day when you check, between before the 3 meals, and at bedtime.  also check if you have symptoms of your blood sugar being too high or too low.  please keep a record of the readings and bring it to your next appointment here.  please call us sooner if your blood sugar goes below 70, or if it stays over 200.  As you write it out, please also write down why it might be higher or lower on a certain day.   Please continue the same insulin.   On this type of insulin schedule, you should eat  meals on a regular schedule (especially lunch).  If a meal is missed or significantly delayed, your blood sugar could go low.  Please come back for a follow-up appointment in 3-4 months.

## 2018-03-08 NOTE — Patient Instructions (Signed)
Continue to use the My plate as a guide for balanced meals.  Small Breakfast, Lunch, and Dinner daily.  Continue to eat your vegetables. Stay active every day. Continue to check your blood sugar  Continue to take your medication as prescribed.

## 2018-03-08 NOTE — Patient Instructions (Addendum)
check your blood sugar twice a day.  vary the time of day when you check, between before the 3 meals, and at bedtime.  also check if you have symptoms of your blood sugar being too high or too low.  please keep a record of the readings and bring it to your next appointment here.  please call us sooner if your blood sugar goes below 70, or if it stays over 200.  As you write it out, please also write down why it might be higher or lower on a certain day.   Please continue the same insulin.   On this type of insulin schedule, you should eat meals on a regular schedule (especially lunch).  If a meal is missed or significantly delayed, your blood sugar could go low.  Please come back for a follow-up appointment in 3-4 months.    

## 2018-03-11 ENCOUNTER — Other Ambulatory Visit: Payer: Self-pay

## 2018-03-11 MED ORDER — INSULIN PEN NEEDLE 32G X 4 MM MISC: 3 refills | Status: DC

## 2018-03-11 MED ORDER — INSULIN PEN NEEDLE 32G X 4 MM MISC: 1 refills | Status: DC

## 2018-03-26 ENCOUNTER — Telehealth: Payer: Self-pay | Admitting: Emergency Medicine

## 2018-03-26 NOTE — Telephone Encounter (Signed)
Called patient to schedule AWV. Pt stated she will call back to schedule at a later date. Pt is due at anytime.

## 2018-05-04 ENCOUNTER — Other Ambulatory Visit: Payer: Self-pay | Admitting: Internal Medicine

## 2018-05-08 ENCOUNTER — Encounter: Payer: Self-pay | Admitting: Podiatry

## 2018-05-08 ENCOUNTER — Ambulatory Visit: Payer: Medicare Other | Admitting: Podiatry

## 2018-05-08 DIAGNOSIS — Q6650 Congenital pes planus, unspecified foot: Secondary | ICD-10-CM

## 2018-05-08 DIAGNOSIS — B351 Tinea unguium: Secondary | ICD-10-CM | POA: Diagnosis not present

## 2018-05-08 DIAGNOSIS — M79676 Pain in unspecified toe(s): Secondary | ICD-10-CM

## 2018-05-08 DIAGNOSIS — E1151 Type 2 diabetes mellitus with diabetic peripheral angiopathy without gangrene: Secondary | ICD-10-CM | POA: Diagnosis not present

## 2018-05-08 NOTE — Progress Notes (Signed)
Patient ID: [name], female   DOB: 04/05/1941, 77 y.o.   MRN: [id number] Complaint:  Visit Type: Patient returns to my office for continued preventative foot care services. Complaint: Patient states" my nails have grown long and thick and become painful to walk and wear shoes" Patient has been diagnosed with DM with neuropathy and angiopathy.. The patient presents for preventative foot care services. No changes to ROS  Podiatric Exam: Vascular: dorsalis pedis and posterior tibial pulses are not  palpable bilateral. Capillary return is immediate. Temperature gradient is WNL. Skin turgor WNL  Sensorium: Normal Semmes Weinstein monofilament test. Normal tactile sensation bilaterally. Nail Exam: Pt has thick disfigured discolored nails with subungual debris noted bilateral entire nail hallux through fifth toenails Ulcer Exam: There is no evidence of ulcer or pre-ulcerative changes or infection. Orthopedic Exam: Muscle tone and strength are WNL. No limitations in general ROM. No crepitus or effusions noted. Foot type and digits show no abnormalities. Bony prominences are unremarkable. Pes planus Skin: No Porokeratosis. No infection or ulcers.  Callus right heel,  Asymptomatic..  Diagnosis:  Onychomycosis, , Pain in right toe, pain in left toes  Treatment & Plan Procedures and Treatment: Consent by patient was obtained for treatment procedures. The patient understood the discussion of treatment and procedures well. All questions were answered thoroughly reviewed. Debridement of mycotic and hypertrophic toenails, 1 through 5 bilateral and clearing of subungual debris. No ulceration, no infection noted.  Return Visit-Office Procedure: Patient instructed to return to the office for a follow up visit 3 months for continued evaluation and treatment.   [name] DPM    

## 2018-05-16 DIAGNOSIS — E119 Type 2 diabetes mellitus without complications: Secondary | ICD-10-CM | POA: Diagnosis not present

## 2018-05-16 DIAGNOSIS — Z794 Long term (current) use of insulin: Secondary | ICD-10-CM | POA: Diagnosis not present

## 2018-06-04 ENCOUNTER — Other Ambulatory Visit: Payer: Self-pay | Admitting: Internal Medicine

## 2018-06-04 ENCOUNTER — Telehealth: Payer: Self-pay

## 2018-06-04 ENCOUNTER — Other Ambulatory Visit (INDEPENDENT_AMBULATORY_CARE_PROVIDER_SITE_OTHER): Payer: Medicare Other

## 2018-06-04 ENCOUNTER — Ambulatory Visit (INDEPENDENT_AMBULATORY_CARE_PROVIDER_SITE_OTHER)
Admission: RE | Admit: 2018-06-04 | Discharge: 2018-06-04 | Disposition: A | Discharge status: Home / Self Care | Payer: Medicare Other | Source: Ambulatory Visit | Attending: Internal Medicine | Admitting: Internal Medicine

## 2018-06-04 ENCOUNTER — Ambulatory Visit (INDEPENDENT_AMBULATORY_CARE_PROVIDER_SITE_OTHER): Payer: Medicare Other | Admitting: Internal Medicine

## 2018-06-04 ENCOUNTER — Other Ambulatory Visit: Payer: Medicare Other

## 2018-06-04 ENCOUNTER — Encounter: Payer: Self-pay | Admitting: Internal Medicine

## 2018-06-04 ENCOUNTER — Other Ambulatory Visit: Payer: Self-pay

## 2018-06-04 VITALS — BP 118/68 | HR 74 | Temp 98.3°F | Ht 63.0 in | Wt 214.0 lb

## 2018-06-04 DIAGNOSIS — E11319 Type 2 diabetes mellitus with unspecified diabetic retinopathy without macular edema: Secondary | ICD-10-CM

## 2018-06-04 DIAGNOSIS — E2839 Other primary ovarian failure: Secondary | ICD-10-CM

## 2018-06-04 DIAGNOSIS — Z794 Long term (current) use of insulin: Secondary | ICD-10-CM

## 2018-06-04 DIAGNOSIS — I10 Essential (primary) hypertension: Secondary | ICD-10-CM | POA: Diagnosis not present

## 2018-06-04 DIAGNOSIS — E11311 Type 2 diabetes mellitus with unspecified diabetic retinopathy with macular edema: Secondary | ICD-10-CM

## 2018-06-04 DIAGNOSIS — E785 Hyperlipidemia, unspecified: Secondary | ICD-10-CM | POA: Diagnosis not present

## 2018-06-04 LAB — LIPID PANEL
CHOL/HDL RATIO: 5
Cholesterol: 188 mg/dL (ref 0–200)
HDL: 39 mg/dL — ABNORMAL LOW (ref >39.00)
LDL CALC: 124 mg/dL — AB (ref 0–99)
NonHDL: 149.26
Triglycerides: 128 mg/dL (ref 0.0–149.0)
VLDL: 25.6 mg/dL (ref 0.0–40.0)

## 2018-06-04 LAB — HEPATIC FUNCTION PANEL
ALK PHOS: 75 U/L (ref 39–117)
ALT: 15 U/L (ref 0–35)
AST: 13 U/L (ref 0–37)
Albumin: 3.6 g/dL (ref 3.5–5.2)
BILIRUBIN DIRECT: 0.1 mg/dL (ref 0.0–0.3)
TOTAL PROTEIN: 6.8 g/dL (ref 6.0–8.3)
Total Bilirubin: 0.2 mg/dL (ref 0.2–1.2)

## 2018-06-04 LAB — BASIC METABOLIC PANEL
BUN: 22 mg/dL (ref 6–23)
CALCIUM: 9.4 mg/dL (ref 8.4–10.5)
CHLORIDE: 107 meq/L (ref 96–112)
CO2: 27 meq/L (ref 19–32)
CREATININE: 1.28 mg/dL — AB (ref 0.40–1.20)
GFR: 51.68 mL/min — ABNORMAL LOW (ref >60.00)
Glucose, Bld: 123 mg/dL — ABNORMAL HIGH (ref 70–99)
Potassium: 4 mEq/L (ref 3.5–5.1)
Sodium: 142 mEq/L (ref 135–145)

## 2018-06-04 LAB — HEMOGLOBIN A1C: HEMOGLOBIN A1C: 8 % — AB (ref 4.6–6.5)

## 2018-06-04 MED ORDER — ROSUVASTATIN CALCIUM 40 MG PO TABS: 40.0000 mg | ORAL_TABLET | Freq: Every day | ORAL | 3 refills | Status: DC

## 2018-06-04 NOTE — Assessment & Plan Note (Signed)
stable overall by history and exam, recent data reviewed with pt, and pt to continue medical treatment as before,  to f/u any worsening symptoms or concerns  

## 2018-06-04 NOTE — Telephone Encounter (Signed)
-----   Message from Biagio Borg, MD sent at 06/04/2018 12:36 PM EDT ----- Letter sent, cont same tx except  The test results show that your current treatment is OK, as the tests are ok except the LDL cholesterol is still high.  .  We should increase the lipitor to 80 mg per day, since the goal LDL should be less than 70.  I will send a new prescription and you should hear from the office as well.  Aarib Pulido to please inform pt, I will do rx

## 2018-06-04 NOTE — Patient Instructions (Signed)
Please continue all other medications as before, and refills have been done if requested.  Please have the pharmacy call with any other refills you may need.  Please continue your efforts at being more active, low cholesterol diet, and weight control.  Please keep your appointments with your specialists as you may have planned  Your labs were drawn this am  You will be contacted by phone if any changes need to be made immediately.  Otherwise, you will receive a letter about your results with an explanation, but please check with MyChart first.  Please remember to sign up for MyChart if you have not done so, as this will be important to you in the future with finding out test results, communicating by private email, and scheduling acute appointments online when needed.  Please return in 6 months, or sooner if needed, with Lab testing done 3-5 days before

## 2018-06-04 NOTE — Telephone Encounter (Signed)
Pt has been informed of results and expressed understanding.  °

## 2018-06-04 NOTE — Progress Notes (Signed)
Subjective:    Patient ID: Gail Lynch, female    DOB: [birth year], 77 y.o.   MRN: [id number]  HPI  Here to f/u; overall doing ok,  Pt denies chest pain, increasing sob or doe, wheezing, orthopnea, PND, increased LE swelling, palpitations, dizziness or syncope.  Pt denies new neurological symptoms such as new headache, or facial or extremity weakness or numbness.  Pt denies polydipsia, polyuria, or low sugar episode.  Pt states overall good compliance with meds, mostly trying to follow appropriate diet, with wt overall stable,  but little exercise however.  No new complaints Past Medical History:  Diagnosis Date  . Arthritis    gout in feet  . Asthma    hx as a child - no inhaler, no problems  . Carotid stenosis 06/18/2012  . Colon polyps   . Diabetes mellitus    Type 2  . Dysplasia of cervix, low grade (CIN 1)    s/p cervical conization  . Glaucoma   . Glaucoma 12/18/2011  . Hard of hearing    hear aids - left  . HPV (human papilloma virus) infection   . Hyperlipidemia   . Hypertension   . Intrinsic asthma, unspecified 12/18/2011  . Mild aortic stenosis 03/14/2017  . Mycotic toenails 02/19/2013  . PONV (postoperative nausea and vomiting)   . SVD (spontaneous vaginal delivery)    x 4   Past Surgical History:  Procedure Laterality Date  . BREAST BIOPSY  2010  . BREAST BIOPSY  1991  . COLONOSCOPY    . DILATION AND CURETTAGE OF UTERUS  2000  . HYSTEROSCOPY W/D&C N/A 03/04/2014   Procedure: DILATATION AND CURETTAGE /HYSTEROSCOPY;  Surgeon: Lavonia Drafts, MD;  Location: [facility];  Service: Gynecology;  Laterality: N/A;  . KNEE SURGERY  2010   patella fracture  . MULTIPLE TOOTH EXTRACTIONS    . POLYPECTOMY      reports that she quit smoking about 37 years ago. Her smoking use included cigarettes. She has quit using smokeless tobacco. She reports that she does not drink alcohol or use drugs. family history includes Asthma in her unknown relative; Diabetes in her brother;  Heart disease in her brother, father, and mother. Allergies  Allergen Reactions  . Aspirin Other (See Comments)    BLEEDING BEHIND EYES  . Penicillin G     Pt. .doesn't remember reaction over 50 years ago.  Marland Kitchen Penicillins Other (See Comments)    unknown   Current Outpatient Medications on File Prior to Visit  Medication Sig Dispense Refill  . acetaminophen (TYLENOL) 500 MG tablet Take 1,000 mg by mouth every 6 (six) hours as needed for moderate pain.     Marland Kitchen allopurinol (ZYLOPRIM) 300 MG tablet Take 1 tablet (300 mg total) by mouth 2 (two) times daily. Must keep 11/15/16 appt for future refills 90 tablet 3  . B Complex Vitamins (VITAMIN B COMPLEX) TABS Take 1 tablet by mouth daily.    . cephALEXin (KEFLEX) 500 MG capsule Take 1 capsule (500 mg total) by mouth 3 (three) times daily. 15 capsule 0  . Cholecalciferol (VITAMIN D3) 1000 UNITS CAPS Take 1,000 Units by mouth daily.     . Flaxseed, Linseed, (FLAX SEED OIL) 1000 MG CAPS Take 2,000 mg by mouth daily.     . Garlic 5035 MG CAPS Take 1,000 mg by mouth daily.     Marland Kitchen glucose blood (ONE TOUCH ULTRA TEST) test strip 1 each by Other route 2 (two) times daily. And lancets 2/day  250.01 180 each 3  . hydroxypropyl methylcellulose / hypromellose (ISOPTO TEARS / GONIOVISC) 2.5 % ophthalmic solution Place 1 drop into both eyes 3 (three) times daily as needed for dry eyes.    . insulin glargine (LANTUS) 100 UNIT/ML injection Inject 0.75 mLs (75 Units total) into the skin every morning. And pen needles 1/day 30 mL 3  . insulin NPH Human (HUMULIN N) 100 UNIT/ML injection Inject 0.8 mLs (80 Units total) into the skin every morning. And syringes 1/day 30 mL 11  . Insulin Pen Needle (RELION PEN NEEDLES) 32G X 4 MM MISC Use daily with Lantus 100 each 1  . Insulin Syringe-Needle U-100 (INSULIN SYRINGE 1CC/31GX5/16") 31G X 5/16" 1 ML MISC USE ONCE DAILY 100 each 0  . polyethylene glycol (MIRALAX / GLYCOLAX) packet Take 17 g by mouth daily as needed for mild  constipation.     . valsartan-hydrochlorothiazide (DIOVAN-HCT) 320-12.5 MG tablet Take 1 tablet by mouth daily. 90 tablet 3  . vitamin E 400 UNIT capsule Take 400 Units by mouth daily.     No current facility-administered medications on file prior to visit.    Review of Systems  Constitutional: Negative for other unusual diaphoresis or sweats HENT: Negative for ear discharge or swelling Eyes: Negative for other worsening visual disturbances Respiratory: Negative for stridor or other swelling  Gastrointestinal: Negative for worsening distension or other blood Genitourinary: Negative for retention or other urinary change Musculoskeletal: Negative for other MSK pain or swelling Skin: Negative for color change or other new lesions Neurological: Negative for worsening tremors and other numbness  Psychiatric/Behavioral: Negative for worsening agitation or other fatigue All other system neg per pt    Objective:   Physical Exam BP 118/68   Pulse 74   Temp 98.3 F (36.8 C) (Oral)   Ht 5\' 3"  (1.6 m)   Wt 214 lb (97.1 kg)   SpO2 98%   BMI 37.91 kg/m  VS noted,  Constitutional: Pt appears in NAD HENT: Head: NCAT.  Right Ear: External ear normal.  Left Ear: External ear normal.  Eyes: . Pupils are equal, round, and reactive to light. Conjunctivae and EOM are normal Nose: without d/c or deformity Neck: Neck supple. Gross normal ROM Cardiovascular: Normal rate and regular rhythm.   Pulmonary/Chest: Effort normal and breath sounds without rales or wheezing.  Abd:  Soft, NT, ND, + BS, no organomegaly Neurological: Pt is alert. At baseline orientation, motor grossly intact Skin: Skin is warm. No rashes, other new lesions, no LE edema Psychiatric: Pt behavior is normal without agitation  No other exam findings Lab Results  Component Value Date   WBC 10.3 12/11/2017   HGB 11.5 (L) 12/11/2017   HCT 36.4 12/11/2017   PLT 354 12/11/2017   GLUCOSE 123 (H) 06/04/2018   CHOL 188 06/04/2018     TRIG 128.0 06/04/2018   HDL 39.00 (L) 06/04/2018   LDLDIRECT 152.9 12/18/2011   LDLCALC 124 (H) 06/04/2018   ALT 15 06/04/2018   AST 13 06/04/2018   NA 142 06/04/2018   K 4.0 06/04/2018   CL 107 06/04/2018   CREATININE 1.28 (H) 06/04/2018   BUN 22 06/04/2018   CO2 27 06/04/2018   TSH 2.63 11/21/2017   INR 1.1 (H) 09/08/2013   HGBA1C 8.0 (H) 06/04/2018   MICROALBUR 2.6 (H) 11/21/2017       Assessment & Plan:

## 2018-06-15 ENCOUNTER — Other Ambulatory Visit: Payer: Self-pay | Admitting: Internal Medicine

## 2018-06-15 ENCOUNTER — Encounter: Payer: Self-pay | Admitting: Internal Medicine

## 2018-06-15 MED ORDER — ALENDRONATE SODIUM 70 MG PO TABS: 70.0000 mg | ORAL_TABLET | ORAL | 3 refills | Status: DC

## 2018-06-17 ENCOUNTER — Telehealth: Payer: Self-pay

## 2018-06-17 NOTE — Telephone Encounter (Signed)
Pt has been informed of results and expressed understanding.  °

## 2018-06-17 NOTE — Telephone Encounter (Signed)
-----   Message from Biagio Borg, MD sent at 06/15/2018  3:05 AM EDT ----- Left message on MyChart, pt to cont same tx except  The test results show that your current treatment is OK, except the bone density test shows bone loss called Osteopenia (which is not as severe as osteopoross).  The level of bone loss is not great, but is to the point where we would usually recommend medication to help slow this down called Fosamax, which is taken only once per week.  I will send the prescription, and you should hear from the office as well.  Shirron to please inform pt, I will do rx

## 2018-06-27 DIAGNOSIS — H04123 Dry eye syndrome of bilateral lacrimal glands: Secondary | ICD-10-CM | POA: Diagnosis not present

## 2018-06-27 DIAGNOSIS — H402230 Chronic angle-closure glaucoma, bilateral, stage unspecified: Secondary | ICD-10-CM | POA: Diagnosis not present

## 2018-06-27 DIAGNOSIS — H402231 Chronic angle-closure glaucoma, bilateral, mild stage: Secondary | ICD-10-CM | POA: Diagnosis not present

## 2018-07-11 ENCOUNTER — Ambulatory Visit: Payer: Medicare Other | Admitting: Endocrinology

## 2018-07-11 ENCOUNTER — Ambulatory Visit: Payer: Medicare Other | Admitting: Dietician

## 2018-08-02 ENCOUNTER — Ambulatory Visit: Payer: Medicare Other | Admitting: Endocrinology

## 2018-08-05 ENCOUNTER — Ambulatory Visit (INDEPENDENT_AMBULATORY_CARE_PROVIDER_SITE_OTHER): Payer: Medicare Other | Admitting: Endocrinology

## 2018-08-05 ENCOUNTER — Encounter: Payer: Self-pay | Admitting: Endocrinology

## 2018-08-05 VITALS — BP 120/68 | HR 67 | Ht 63.0 in | Wt 213.6 lb

## 2018-08-05 DIAGNOSIS — Z794 Long term (current) use of insulin: Secondary | ICD-10-CM | POA: Diagnosis not present

## 2018-08-05 DIAGNOSIS — E11319 Type 2 diabetes mellitus with unspecified diabetic retinopathy without macular edema: Secondary | ICD-10-CM | POA: Diagnosis not present

## 2018-08-05 LAB — POCT GLYCOSYLATED HEMOGLOBIN (HGB A1C): HEMOGLOBIN A1C: 8.1 % — AB (ref 4.0–5.6)

## 2018-08-05 MED ORDER — INSULIN GLARGINE 100 UNIT/ML ~~LOC~~ SOLN: 55.0000 [IU] | SUBCUTANEOUS | 3 refills | Status: DC

## 2018-08-05 NOTE — Progress Notes (Signed)
Subjective:    Patient ID: Gail Lynch, female    DOB: 12-May-1941, 77 y.o.   MRN: [id number]  HPI Pt returns for f/u of diabetes mellitus: DM type: Insulin-requiring type 2. Dx'ed: 0539 Complications: polyneuropathy, renal insuff, and retinopathy.   Therapy: insulin since soon after dx.    GDM: never. DKA: never Severe hypoglycemia: never.   Pancreatitis: never.   Other: she declines multiple daily injections; she declines to increase frequency of injections; she changed from lantus to NPH, due to pattern of cbg's. However, she chose to resume lantus Interval history: she brings a record of her cbg's which I have reviewed today.  It varies from 89-187. It is in general higher as the day goes on.  pt states she feels well in general.  She says she never misses the insulin, but she takes just 45-55 units qam.  She reports mild hypoglycemia in the middles of the night Past Medical History:  Diagnosis Date  . Arthritis    gout in feet  . Asthma    hx as a child - no inhaler, no problems  . Carotid stenosis 06/18/2012  . Colon polyps   . Diabetes mellitus    Type 2  . Dysplasia of cervix, low grade (CIN 1)    s/p cervical conization  . Glaucoma   . Glaucoma 12/18/2011  . Hard of hearing    hear aids - left  . HPV (human papilloma virus) infection   . Hyperlipidemia   . Hypertension   . Intrinsic asthma, unspecified 12/18/2011  . Mild aortic stenosis 03/14/2017  . Mycotic toenails 02/19/2013  . PONV (postoperative nausea and vomiting)   . SVD (spontaneous vaginal delivery)    x 4    Past Surgical History:  Procedure Laterality Date  . BREAST BIOPSY  2010  . BREAST BIOPSY  1991  . COLONOSCOPY    . DILATION AND CURETTAGE OF UTERUS  2000  . HYSTEROSCOPY W/D&C N/A 03/04/2014   Procedure: DILATATION AND CURETTAGE /HYSTEROSCOPY;  Surgeon: Lavonia Drafts, MD;  Location: [facility];  Service: Gynecology;  Laterality: N/A;  . KNEE SURGERY  2010   patella fracture  . MULTIPLE  TOOTH EXTRACTIONS    . POLYPECTOMY      Social History   Socioeconomic History  . Marital status: Widowed    Spouse name: Not on file  . Number of children: 3  . Years of education: Not on file  . Highest education level: Not on file  Occupational History  . Occupation: retired  Scientific laboratory technician  . Financial resource strain: Not on file  . Food insecurity:    Worry: Not on file    Inability: Not on file  . Transportation needs:    Medical: Not on file    Non-medical: Not on file  Tobacco Use  . Smoking status: Former Smoker    Types: Cigarettes    Last attempt to quit: 08/21/1980    Years since quitting: 37.9  . Smokeless tobacco: Former Network engineer and Sexual Activity  . Alcohol use: No    Alcohol/week: 0.0 standard drinks  . Drug use: No  . Sexual activity: Yes    Birth control/protection: Post-menopausal  Lifestyle  . Physical activity:    Days per week: Not on file    Minutes per session: Not on file  . Stress: Not on file  Relationships  . Social connections:    Talks on phone: Not on file    Gets together: Not  on file    Attends religious service: Not on file    Active member of club or organization: Not on file    Attends meetings of clubs or organizations: Not on file    Relationship status: Not on file  . Intimate partner violence:    Fear of current or ex partner: Not on file    Emotionally abused: Not on file    Physically abused: Not on file    Forced sexual activity: Not on file  Other Topics Concern  . Not on file  Social History Narrative  . Not on file    Current Outpatient Medications on File Prior to Visit  Medication Sig Dispense Refill  . acetaminophen (TYLENOL) 500 MG tablet Take 1,000 mg by mouth every 6 (six) hours as needed for moderate pain.     Marland Kitchen allopurinol (ZYLOPRIM) 300 MG tablet Take 1 tablet (300 mg total) by mouth 2 (two) times daily. Must keep 11/15/16 appt for future refills 90 tablet 3  . B Complex Vitamins (VITAMIN B  COMPLEX) TABS Take 1 tablet by mouth daily.    . Cholecalciferol (VITAMIN D3) 1000 UNITS CAPS Take 1,000 Units by mouth daily.     . Flaxseed, Linseed, (FLAX SEED OIL) 1000 MG CAPS Take 2,000 mg by mouth daily.     . Garlic 6761 MG CAPS Take 1,000 mg by mouth daily.     Marland Kitchen glucose blood (ONE TOUCH ULTRA TEST) test strip 1 each by Other route 2 (two) times daily. And lancets 2/day 250.01 180 each 3  . hydroxypropyl methylcellulose / hypromellose (ISOPTO TEARS / GONIOVISC) 2.5 % ophthalmic solution Place 1 drop into both eyes 3 (three) times daily as needed for dry eyes.    . polyethylene glycol (MIRALAX / GLYCOLAX) packet Take 17 g by mouth daily as needed for mild constipation.     . rosuvastatin (CRESTOR) 40 MG tablet Take 1 tablet (40 mg total) by mouth daily. 90 tablet 3  . valsartan-hydrochlorothiazide (DIOVAN-HCT) 320-12.5 MG tablet Take 1 tablet by mouth daily. 90 tablet 3  . vitamin E 400 UNIT capsule Take 400 Units by mouth daily.    Marland Kitchen alendronate (FOSAMAX) 70 MG tablet Take 1 tablet (70 mg total) by mouth every 7 (seven) days. Take with a full glass of water on an empty stomach. (Patient not taking: Reported on 08/05/2018) 12 tablet 3   No current facility-administered medications on file prior to visit.     Allergies  Allergen Reactions  . Aspirin Other (See Comments)    BLEEDING BEHIND EYES  . Penicillin G     Pt. .doesn't remember reaction over 50 years ago.  Marland Kitchen Penicillins Other (See Comments)    unknown    Family History  Problem Relation Age of Onset  . Diabetes Brother   . Heart disease Brother   . Heart disease Mother   . Heart disease Father   . Asthma Unknown   . Colon cancer Neg Hx   . Rectal cancer Neg Hx   . Stomach cancer Neg Hx   . Esophageal cancer Neg Hx     BP 120/68 (BP Location: Left Arm, Patient Position: Sitting, Cuff Size: Normal)   Pulse 67   Ht 5\' 3"  (1.6 m)   Wt 213 lb 9.6 oz (96.9 kg)   SpO2 97%   BMI 37.84 kg/m    Review of  Systems She denies hypoglycemia.      Objective:   Physical Exam VITAL SIGNS:  See vs page.  GENERAL: no distress. Pulses: foot pulses are intact bilaterally.   MSK: no deformity of the feet or ankles.  CV: 1+ bilat edema of the legs Skin:  no ulcer on the feet or ankles.  normal color and temp on the feet and ankles Neuro: sensation is intact to touch on the feet and ankles.  Ext: There is bilateral onychomycosis of the toenails.     Lab Results  Component Value Date   HGBA1C 8.1 (A) 08/05/2018   Lab Results  Component Value Date   CREATININE 1.28 (H) 06/04/2018   BUN 22 06/04/2018   NA 142 06/04/2018   K 4.0 06/04/2018   CL 107 06/04/2018   CO2 27 06/04/2018      Assessment & Plan:  Insulin-requiring type 2 DM, with CAD: she needs increased rx Hypoglycemia: this limits aggresiveness of glycemic control Renal insuff: she would be better with a faster-acting qd insulin.  We discussed.  She declines to change to NPH, at least for now  Patient Instructions  check your blood sugar twice a day.  vary the time of day when you check, between before the 3 meals, and at bedtime.  also check if you have symptoms of your blood sugar being too high or too low.  please keep a record of the readings and bring it to your next appointment here.  please call us sooner if your blood sugar goes below 70, or if it stays over 200.  As you write it out, please also write down why it might be higher or lower on a certain day.   Please continue the same insulin.   On this type of insulin schedule, you should eat meals on a regular schedule (especially lunch).  If a meal is missed or significantly delayed, your blood sugar could go low.  Please come back for a follow-up appointment in 3-4 months.

## 2018-08-05 NOTE — Patient Instructions (Signed)
check your blood sugar twice a day.  vary the time of day when you check, between before the 3 meals, and at bedtime.  also check if you have symptoms of your blood sugar being too high or too low.  please keep a record of the readings and bring it to your next appointment here.  please call us sooner if your blood sugar goes below 70, or if it stays over 200.  As you write it out, please also write down why it might be higher or lower on a certain day.   Please continue the same insulin.   On this type of insulin schedule, you should eat meals on a regular schedule (especially lunch).  If a meal is missed or significantly delayed, your blood sugar could go low.  Please come back for a follow-up appointment in 3-4 months.

## 2018-08-07 ENCOUNTER — Ambulatory Visit: Payer: Medicare Other | Admitting: Podiatry

## 2018-08-19 DIAGNOSIS — Z794 Long term (current) use of insulin: Secondary | ICD-10-CM | POA: Diagnosis not present

## 2018-08-19 DIAGNOSIS — E119 Type 2 diabetes mellitus without complications: Secondary | ICD-10-CM | POA: Diagnosis not present

## 2018-08-28 ENCOUNTER — Encounter: Payer: Self-pay | Admitting: Endocrinology

## 2018-08-28 ENCOUNTER — Encounter (INDEPENDENT_AMBULATORY_CARE_PROVIDER_SITE_OTHER): Payer: Medicare Other | Admitting: Ophthalmology

## 2018-08-28 DIAGNOSIS — I10 Essential (primary) hypertension: Secondary | ICD-10-CM | POA: Diagnosis not present

## 2018-08-28 DIAGNOSIS — H43813 Vitreous degeneration, bilateral: Secondary | ICD-10-CM

## 2018-08-28 DIAGNOSIS — E113311 Type 2 diabetes mellitus with moderate nonproliferative diabetic retinopathy with macular edema, right eye: Secondary | ICD-10-CM | POA: Diagnosis not present

## 2018-08-28 DIAGNOSIS — H4312 Vitreous hemorrhage, left eye: Secondary | ICD-10-CM

## 2018-08-28 DIAGNOSIS — E113512 Type 2 diabetes mellitus with proliferative diabetic retinopathy with macular edema, left eye: Secondary | ICD-10-CM

## 2018-08-28 DIAGNOSIS — E11311 Type 2 diabetes mellitus with unspecified diabetic retinopathy with macular edema: Secondary | ICD-10-CM | POA: Diagnosis not present

## 2018-08-28 DIAGNOSIS — H35033 Hypertensive retinopathy, bilateral: Secondary | ICD-10-CM

## 2018-08-28 LAB — HM DIABETES EYE EXAM

## 2018-08-29 ENCOUNTER — Other Ambulatory Visit: Payer: Self-pay

## 2018-08-29 ENCOUNTER — Encounter (HOSPITAL_COMMUNITY): Payer: Self-pay | Admitting: Emergency Medicine

## 2018-08-29 ENCOUNTER — Emergency Department (HOSPITAL_COMMUNITY)
Admission: EM | Admit: 2018-08-29 | Discharge: 2018-08-29 | Disposition: A | Discharge status: Home / Self Care | Payer: Medicare Other | Attending: Emergency Medicine | Admitting: Emergency Medicine

## 2018-08-29 ENCOUNTER — Emergency Department (HOSPITAL_COMMUNITY): Payer: Medicare Other

## 2018-08-29 DIAGNOSIS — Y92013 Bedroom of single-family (private) house as the place of occurrence of the external cause: Secondary | ICD-10-CM | POA: Insufficient documentation

## 2018-08-29 DIAGNOSIS — W19XXXA Unspecified fall, initial encounter: Secondary | ICD-10-CM | POA: Diagnosis not present

## 2018-08-29 DIAGNOSIS — I129 Hypertensive chronic kidney disease with stage 1 through stage 4 chronic kidney disease, or unspecified chronic kidney disease: Secondary | ICD-10-CM | POA: Diagnosis not present

## 2018-08-29 DIAGNOSIS — Y9389 Activity, other specified: Secondary | ICD-10-CM | POA: Diagnosis not present

## 2018-08-29 DIAGNOSIS — Z87891 Personal history of nicotine dependence: Secondary | ICD-10-CM | POA: Insufficient documentation

## 2018-08-29 DIAGNOSIS — Z79899 Other long term (current) drug therapy: Secondary | ICD-10-CM | POA: Diagnosis not present

## 2018-08-29 DIAGNOSIS — W01198A Fall on same level from slipping, tripping and stumbling with subsequent striking against other object, initial encounter: Secondary | ICD-10-CM | POA: Insufficient documentation

## 2018-08-29 DIAGNOSIS — Y998 Other external cause status: Secondary | ICD-10-CM | POA: Insufficient documentation

## 2018-08-29 DIAGNOSIS — S0512XA Contusion of eyeball and orbital tissues, left eye, initial encounter: Secondary | ICD-10-CM | POA: Diagnosis not present

## 2018-08-29 DIAGNOSIS — N183 Chronic kidney disease, stage 3 (moderate): Secondary | ICD-10-CM | POA: Diagnosis not present

## 2018-08-29 DIAGNOSIS — S199XXA Unspecified injury of neck, initial encounter: Secondary | ICD-10-CM | POA: Diagnosis not present

## 2018-08-29 DIAGNOSIS — Z794 Long term (current) use of insulin: Secondary | ICD-10-CM | POA: Diagnosis not present

## 2018-08-29 DIAGNOSIS — E1122 Type 2 diabetes mellitus with diabetic chronic kidney disease: Secondary | ICD-10-CM | POA: Insufficient documentation

## 2018-08-29 DIAGNOSIS — J45909 Unspecified asthma, uncomplicated: Secondary | ICD-10-CM | POA: Diagnosis not present

## 2018-08-29 DIAGNOSIS — R52 Pain, unspecified: Secondary | ICD-10-CM | POA: Diagnosis not present

## 2018-08-29 DIAGNOSIS — M791 Myalgia, unspecified site: Secondary | ICD-10-CM | POA: Diagnosis not present

## 2018-08-29 DIAGNOSIS — S0083XA Contusion of other part of head, initial encounter: Secondary | ICD-10-CM

## 2018-08-29 DIAGNOSIS — S0990XA Unspecified injury of head, initial encounter: Secondary | ICD-10-CM | POA: Diagnosis not present

## 2018-08-29 NOTE — ED Notes (Signed)
Bed: UV90 Expected date:  Expected time:  Means of arrival:  Comments: EMS 77yo fall

## 2018-08-29 NOTE — Discharge Instructions (Signed)
Your CAT scans were reassuring.  No bleeding in your head.  No fractured bones.  You do have a hematoma to the left eye that needs to be treated with ice and NSAIDs.  Patient to follow-up with your primary care doctor.  He develop any vision changes, worsening pain worsening swelling return the ED immediately.

## 2018-08-29 NOTE — ED Provider Notes (Signed)
[facility] DEPT Provider Note   CSN: [id number] Arrival date & time: 08/29/18  4098     History   Chief Complaint Chief Complaint  Patient presents with  . Fall    HPI Gail Lynch is a 78 y.o. female.  HPI 78 year old female with no pertinent past medical history presents to the emergency department today for evaluation of fall.  Patient states that she was getting out of bed this morning when she stood up too quickly losing her footing and balance falling forward striking her left forehead and eye on the ground.  Patient denied any LOC.  She was able to get herself up.  She denies any blood thinners.  Denies any neck, back pain.  Actually patient denies no pain at all.  She denies any vision changes.  She states that she is ready to go home.  Denies any nausea or vomiting.  Denies lightheadedness or dizziness.  She has not taken anything for pain prior to arrival today. Past Medical History:  Diagnosis Date  . Arthritis    gout in feet  . Asthma    hx as a child - no inhaler, no problems  . Carotid stenosis 06/18/2012  . Colon polyps   . Diabetes mellitus    Type 2  . Dysplasia of cervix, low grade (CIN 1)    s/p cervical conization  . Glaucoma   . Glaucoma 12/18/2011  . Hard of hearing    hear aids - left  . HPV (human papilloma virus) infection   . Hyperlipidemia   . Hypertension   . Intrinsic asthma, unspecified 12/18/2011  . Mild aortic stenosis 03/14/2017  . Mycotic toenails 02/19/2013  . PONV (postoperative nausea and vomiting)   . SVD (spontaneous vaginal delivery)    x 4    Patient Active Problem List   Diagnosis Date Noted  . Mild aortic stenosis 03/14/2017  . Peripheral edema 03/14/2017  . Arm heaviness 03/14/2017  . CKD (chronic kidney disease), stage III ([place]) 05/20/2016  . Diabetes ([place]) 01/03/2016  . Dysuria 12/09/2014  . Peripheral neuropathy 12/05/2013  . Irregular heart beat 12/05/2013  . PVC (premature ventricular  contraction) 12/05/2013  . Sciatica 06/17/2013  . Pain due to onychomycosis of toenail 05/21/2013  . Mycotic toenails   . Vagina bleeding 08/06/2012  . Carotid stenosis 06/18/2012  . Constipation 04/30/2012  . Childhood asthma 12/18/2011  . Glaucoma 12/18/2011  . Hyperlipidemia 12/18/2011  . Colon polyps 12/18/2011  . Preventative health care 12/17/2011  . Hypertension 12/17/2011  . Hard of hearing   . Dysplasia of cervix, low grade (CIN 1)   . HPV (human papilloma virus) infection   . Brain mass 09/18/2011    Past Surgical History:  Procedure Laterality Date  . BREAST BIOPSY  2010  . BREAST BIOPSY  1991  . COLONOSCOPY    . DILATION AND CURETTAGE OF UTERUS  2000  . HYSTEROSCOPY W/D&C N/A 03/04/2014   Procedure: DILATATION AND CURETTAGE /HYSTEROSCOPY;  Surgeon: Lavonia Drafts, MD;  Location: [facility];  Service: Gynecology;  Laterality: N/A;  . KNEE SURGERY  2010   patella fracture  . MULTIPLE TOOTH EXTRACTIONS    . POLYPECTOMY       OB History    Gravida  3   Para  3   Term  3   Preterm  0   AB  0   Living  3     SAB  0   TAB  0  Ectopic  0   Multiple  0   Live Births               Home Medications    Prior to Admission medications   Medication Sig Start Date End Date Taking? Authorizing Provider  acetaminophen (TYLENOL) 500 MG tablet Take 1,000 mg by mouth every 6 (six) hours as needed for moderate pain.    Yes [provider]  B Complex Vitamins (VITAMIN B COMPLEX) TABS Take 1 tablet by mouth at bedtime.    Yes [provider]  Cholecalciferol (VITAMIN D3) 1000 UNITS CAPS Take 1,000 Units by mouth daily.    Yes [provider]  Flaxseed, Linseed, (FLAX SEED OIL) 1000 MG CAPS Take 2,000 mg by mouth daily.    Yes [provider]  Garlic 4818 MG CAPS Take 1,000 mg by mouth daily.    Yes [provider]  hydroxypropyl methylcellulose / hypromellose (ISOPTO TEARS / GONIOVISC) 2.5 % ophthalmic  solution Place 1 drop into both eyes 3 (three) times daily as needed for dry eyes.   Yes [provider]  insulin glargine (LANTUS) 100 UNIT/ML injection Inject 0.55 mLs (55 Units total) into the skin every morning. And pen needles 1/day 08/05/18  Yes Renato Shin, MD  polyethylene glycol [facility] / GLYCOLAX) packet Take 17 g by mouth daily as needed for mild constipation.    Yes [provider]  trimethoprim-polymyxin b (POLYTRIM) ophthalmic solution Place 1 drop into the left eye 4 (four) times daily.   Yes [provider]  valsartan-hydrochlorothiazide (DIOVAN-HCT) 320-12.5 MG tablet Take 1 tablet by mouth daily. 02/01/18  Yes Biagio Borg, MD  vitamin E 400 UNIT capsule Take 400 Units by mouth daily.   Yes [provider]  alendronate (FOSAMAX) 70 MG tablet Take 1 tablet (70 mg total) by mouth every 7 (seven) days. Take with a full glass of water on an empty stomach. Patient not taking: Reported on 08/29/2018 06/15/18   Biagio Borg, MD  allopurinol (ZYLOPRIM) 300 MG tablet Take 1 tablet (300 mg total) by mouth 2 (two) times daily. Must keep 11/15/16 appt for future refills Patient not taking: Reported on 08/29/2018 11/15/16   Biagio Borg, MD  glucose blood (ONE TOUCH ULTRA TEST) test strip 1 each by Other route 2 (two) times daily. And lancets 2/day 250.01 07/09/13   Renato Shin, MD  rosuvastatin (CRESTOR) 40 MG tablet Take 1 tablet (40 mg total) by mouth daily. Patient not taking: Reported on 08/29/2018 06/04/18   Biagio Borg, MD    Family History Family History  Problem Relation Age of Onset  . Diabetes Brother   . Heart disease Brother   . Heart disease Mother   . Heart disease Father   . Asthma Other   . Colon cancer Neg Hx   . Rectal cancer Neg Hx   . Stomach cancer Neg Hx   . Esophageal cancer Neg Hx     Social History Social History   Tobacco Use  . Smoking status: Former Smoker    Types: Cigarettes    Last attempt to quit: 08/21/1980      Years since quitting: 38.0  . Smokeless tobacco: Former Network engineer Use Topics  . Alcohol use: No    Alcohol/week: 0.0 standard drinks  . Drug use: No     Allergies   Aspirin; Penicillin g; and Penicillins   Review of Systems Review of Systems  Constitutional: Negative for chills and  fever.  Eyes: Negative for visual disturbance.  Respiratory: Negative for shortness of breath.   Cardiovascular: Negative for chest pain.  Gastrointestinal: Negative for abdominal pain, nausea and vomiting.  Musculoskeletal: Positive for myalgias.  Skin: Negative for color change and wound.  Neurological: Negative for dizziness, syncope, weakness, light-headedness and headaches.     Physical Exam Updated Vital Signs BP (!) 160/70 (BP Location: Left Arm)   Pulse 85   Temp 98.2 F (36.8 C) (Oral)   Resp 16   Ht 5\' 2"  (1.575 m)   Wt 90.7 kg   SpO2 100%   BMI 36.58 kg/m   Physical Exam Physical Exam  Constitutional: Pt is oriented to person, place, and time. Appears well-developed and well-nourished. No distress.  HENT:  Head: Normocephalic. No battle signs or raccoon eyes.  Patient does have large hematoma above the left orbit.  There is no wound noted.  No conjunctival hemorrhage.  EOMs are intact. Ears: No bilateral hemotympanum. Nose: Nose normal. No septal hematoma. Mouth/Throat: Uvula is midline, oropharynx is clear and moist and mucous membranes are normal.  Eyes: Conjunctivae and EOM are normal. Pupils are equal, round, and reactive to light.  Neck: No spinous process tenderness and no muscular tenderness present. No rigidity. Normal range of motion present.  \Full ROM without pain No midline cervical tenderness No crepitus, deformity or step-offs \ No paraspinal tenderness  Cardiovascular: Normal rate, regular rhythm and intact distal pulses.   Pulses:      Radial pulses are 2+ on the right side, and 2+ on the left side.       Dorsalis pedis pulses are 2+ on the right  side, and 2+ on the left side.       Posterior tibial pulses are 2+ on the right side, and 2+ on the left side.  Pulmonary/Chest: Effort normal and breath sounds normal. No accessory muscle usage. No respiratory distress. No decreased breath sounds. No wheezes. No rhonchi. No rales. Exhibits no tenderness and no bony tenderness.  No flail segment, crepitus or deformity Equal chest expansion  Abdominal: Soft. Normal appearance and bowel sounds are normal. There is no tenderness. There is no rigidity, no guarding and no CVA tenderness.  Abd soft and nontender  Musculoskeletal: Normal range of motion.       Thoracic back: Exhibits normal range of motion.       Lumbar back: Exhibits normal range of motion.  Full range of motion of the T-spine and L-spine No tenderness to palpation of the spinous processes of the T-spine or L-spine No crepitus, deformity or step-offs No tenderness to palpation of the paraspinous muscles of the L-spine  Lymphadenopathy:    Pt has no cervical adenopathy.  Neurological: Pt is alert and oriented to person, place, and time. Normal reflexes. No cranial nerve deficit. GCS eye subscore is 4. GCS verbal subscore is 5. GCS motor subscore is 6.  Reflex Scores:      Bicep reflexes are 2+ on the right side and 2+ on the left side.      Brachioradialis reflexes are 2+ on the right side and 2+ on the left side.      Patellar reflexes are 2+ on the right side and 2+ on the left side.      Achilles reflexes are 2+ on the right side and 2+ on the left side. Speech is clear and goal oriented, follows commands Normal 5/5 strength in upper and lower extremities bilaterally including dorsiflexion and plantar flexion, strong  and equal grip strength Sensation normal to light and sharp touch Moves extremities without ataxia, coordination intact Pt able to ambulate with normal gait. No Clonus  Skin: Skin is warm and dry. No rash noted. Pt is not diaphoretic. No erythema.  Psychiatric:  Normal mood and affect.  Nursing note and vitals reviewed.     ED Treatments / Results  Labs (all labs ordered are listed, but only abnormal results are displayed) Labs Reviewed - No data to display  EKG None  Radiology Ct Head Wo Contrast  Result Date: 08/29/2018 CLINICAL DATA:  Status post fall with head injury. EXAM: CT HEAD WITHOUT CONTRAST CT MAXILLOFACIAL WITHOUT CONTRAST CT CERVICAL SPINE WITHOUT CONTRAST TECHNIQUE: Multidetector CT imaging of the head, cervical spine, and maxillofacial structures were performed using the standard protocol without intravenous contrast. Multiplanar CT image reconstructions of the cervical spine and maxillofacial structures were also generated. COMPARISON:  None. FINDINGS: CT HEAD FINDINGS Brain: No evidence of acute infarction, hemorrhage, hydrocephalus, extra-axial collection or mass lesion/mass effect. Moderate brain parenchymal volume loss and deep white matter microangiopathy. Vascular: Calcific atherosclerotic disease at the skull base. Skull: Normal. Negative for fracture or focal lesion. Other: None. CT MAXILLOFACIAL FINDINGS Osseous: No fracture or mandibular dislocation. No destructive process. Orbits: Negative. No traumatic or inflammatory finding. Sinuses: Clear. Soft tissues: Left preseptal periorbital hematoma. CT CERVICAL SPINE FINDINGS Alignment: Mildly exaggerated cervical lordosis. Skull base and vertebrae: No acute fracture. No primary bone lesion or focal pathologic process. Soft tissues and spinal canal: No prevertebral fluid or swelling. No visible canal hematoma. Disc levels: Multilevel osteoarthritic changes with disc space narrowing, vertebral body remodeling and large osteophyte formation. Likely degenerative ankylosis of C5-C6 vertebral bodies. Upper chest: Negative. Other: Calcific atherosclerotic disease of the carotid arteries. IMPRESSION: No evidence of intracranial hematoma or other acute abnormality of the brain. Moderate brain  parenchymal volume loss and microangiopathy. No evidence of facial fractures. No evidence of cervical spine fractures. Left preseptal periorbital hematoma. Electronically Signed   By: Fidela Salisbury M.D.   On: 08/29/2018 12:51   Ct Cervical Spine Wo Contrast  Result Date: 08/29/2018 CLINICAL DATA:  Status post fall with head injury. EXAM: CT HEAD WITHOUT CONTRAST CT MAXILLOFACIAL WITHOUT CONTRAST CT CERVICAL SPINE WITHOUT CONTRAST TECHNIQUE: Multidetector CT imaging of the head, cervical spine, and maxillofacial structures were performed using the standard protocol without intravenous contrast. Multiplanar CT image reconstructions of the cervical spine and maxillofacial structures were also generated. COMPARISON:  None. FINDINGS: CT HEAD FINDINGS Brain: No evidence of acute infarction, hemorrhage, hydrocephalus, extra-axial collection or mass lesion/mass effect. Moderate brain parenchymal volume loss and deep white matter microangiopathy. Vascular: Calcific atherosclerotic disease at the skull base. Skull: Normal. Negative for fracture or focal lesion. Other: None. CT MAXILLOFACIAL FINDINGS Osseous: No fracture or mandibular dislocation. No destructive process. Orbits: Negative. No traumatic or inflammatory finding. Sinuses: Clear. Soft tissues: Left preseptal periorbital hematoma. CT CERVICAL SPINE FINDINGS Alignment: Mildly exaggerated cervical lordosis. Skull base and vertebrae: No acute fracture. No primary bone lesion or focal pathologic process. Soft tissues and spinal canal: No prevertebral fluid or swelling. No visible canal hematoma. Disc levels: Multilevel osteoarthritic changes with disc space narrowing, vertebral body remodeling and large osteophyte formation. Likely degenerative ankylosis of C5-C6 vertebral bodies. Upper chest: Negative. Other: Calcific atherosclerotic disease of the carotid arteries. IMPRESSION: No evidence of intracranial hematoma or other acute abnormality of the brain.  Moderate brain parenchymal volume loss and microangiopathy. No evidence of facial fractures. No evidence of cervical spine  fractures. Left preseptal periorbital hematoma. Electronically Signed   By: Fidela Salisbury M.D.   On: 08/29/2018 12:51   Ct Maxillofacial Wo Contrast  Result Date: 08/29/2018 CLINICAL DATA:  Status post fall with head injury. EXAM: CT HEAD WITHOUT CONTRAST CT MAXILLOFACIAL WITHOUT CONTRAST CT CERVICAL SPINE WITHOUT CONTRAST TECHNIQUE: Multidetector CT imaging of the head, cervical spine, and maxillofacial structures were performed using the standard protocol without intravenous contrast. Multiplanar CT image reconstructions of the cervical spine and maxillofacial structures were also generated. COMPARISON:  None. FINDINGS: CT HEAD FINDINGS Brain: No evidence of acute infarction, hemorrhage, hydrocephalus, extra-axial collection or mass lesion/mass effect. Moderate brain parenchymal volume loss and deep white matter microangiopathy. Vascular: Calcific atherosclerotic disease at the skull base. Skull: Normal. Negative for fracture or focal lesion. Other: None. CT MAXILLOFACIAL FINDINGS Osseous: No fracture or mandibular dislocation. No destructive process. Orbits: Negative. No traumatic or inflammatory finding. Sinuses: Clear. Soft tissues: Left preseptal periorbital hematoma. CT CERVICAL SPINE FINDINGS Alignment: Mildly exaggerated cervical lordosis. Skull base and vertebrae: No acute fracture. No primary bone lesion or focal pathologic process. Soft tissues and spinal canal: No prevertebral fluid or swelling. No visible canal hematoma. Disc levels: Multilevel osteoarthritic changes with disc space narrowing, vertebral body remodeling and large osteophyte formation. Likely degenerative ankylosis of C5-C6 vertebral bodies. Upper chest: Negative. Other: Calcific atherosclerotic disease of the carotid arteries. IMPRESSION: No evidence of intracranial hematoma or other acute abnormality of  the brain. Moderate brain parenchymal volume loss and microangiopathy. No evidence of facial fractures. No evidence of cervical spine fractures. Left preseptal periorbital hematoma. Electronically Signed   By: Fidela Salisbury M.D.   On: 08/29/2018 12:51    Procedures Procedures (including critical care time)  Medications Ordered in ED Medications - No data to display   Initial Impression / Assessment and Plan / ED Course  I have reviewed the triage vital signs and the nursing notes.  Pertinent labs & imaging results that were available during my care of the patient were reviewed by me and considered in my medical decision making (see chart for details).     Patient resents the ED for evaluation following mechanical fall prior to arrival.  Patient neurovascular intact.  Pt is not on blood thinners.  No focal neurological deficit noted on exam.  No signs of intrathoracic or intra-abdominal trauma.  Hematoma noted above the left eye.  No open wound.  EOMs are intact.  CT imaging of head, face and neck showed no acute findings from the fall today.  Patient had been able to ambulate in the ED with normal gait.  Patient is ready to be discharged home.  Has close follow-up and has family at bedside to watch patient.  Pt is hemodynamically stable, in NAD, & able to ambulate in the ED. Evaluation does not show pathology that would require ongoing emergent intervention or inpatient treatment. I explained the diagnosis to the patient. Pain has been managed & has no complaints prior to dc. Pt is comfortable with above plan and is stable for discharge at this time. All questions were answered prior to disposition. Strict return precautions for f/u to the ED were discussed. Encouraged follow up with PCP.  Final Clinical Impressions(s) / ED Diagnoses   Final diagnoses:  Fall, initial encounter  Contusion of face, initial encounter    ED Discharge Orders    None       Aaron Edelman 08/29/18 1306    Fredia Sorrow, MD 08/29/18 1353

## 2018-08-29 NOTE — ED Triage Notes (Signed)
Patient BIB [facility] from home, lost balance stood up from bed too quickly, no loc. Hematoma left eye. No neck or back pain, denies bld thinners. A&O x4 ambulatory on scene.

## 2018-08-29 NOTE — ED Notes (Signed)
Patient ambulated to restroom with standby assist.  

## 2018-09-17 ENCOUNTER — Ambulatory Visit (INDEPENDENT_AMBULATORY_CARE_PROVIDER_SITE_OTHER): Payer: Medicare Other | Admitting: Internal Medicine

## 2018-09-17 ENCOUNTER — Encounter: Payer: Self-pay | Admitting: Internal Medicine

## 2018-09-17 ENCOUNTER — Other Ambulatory Visit (INDEPENDENT_AMBULATORY_CARE_PROVIDER_SITE_OTHER): Payer: Medicare Other

## 2018-09-17 VITALS — BP 122/76 | HR 73 | Temp 98.5°F | Ht 62.0 in | Wt 211.0 lb

## 2018-09-17 DIAGNOSIS — Z0000 Encounter for general adult medical examination without abnormal findings: Secondary | ICD-10-CM

## 2018-09-17 DIAGNOSIS — E11319 Type 2 diabetes mellitus with unspecified diabetic retinopathy without macular edema: Secondary | ICD-10-CM

## 2018-09-17 DIAGNOSIS — Z794 Long term (current) use of insulin: Secondary | ICD-10-CM

## 2018-09-17 LAB — HEPATIC FUNCTION PANEL
ALK PHOS: 96 U/L (ref 39–117)
ALT: 11 U/L (ref 0–35)
AST: 12 U/L (ref 0–37)
Albumin: 3.5 g/dL (ref 3.5–5.2)
Bilirubin, Direct: 0 mg/dL (ref 0.0–0.3)
Total Bilirubin: 0.3 mg/dL (ref 0.2–1.2)
Total Protein: 6.5 g/dL (ref 6.0–8.3)

## 2018-09-17 LAB — LIPID PANEL
Cholesterol: 221 mg/dL — ABNORMAL HIGH (ref 0–200)
HDL: 34.7 mg/dL — ABNORMAL LOW (ref >39.00)
LDL Cholesterol: 160 mg/dL — ABNORMAL HIGH (ref 0–99)
NonHDL: 186.33
Total CHOL/HDL Ratio: 6
Triglycerides: 132 mg/dL (ref 0.0–149.0)
VLDL: 26.4 mg/dL (ref 0.0–40.0)

## 2018-09-17 LAB — BASIC METABOLIC PANEL
BUN: 18 mg/dL (ref 6–23)
CALCIUM: 9.3 mg/dL (ref 8.4–10.5)
CO2: 27 mEq/L (ref 19–32)
CREATININE: 1.23 mg/dL — AB (ref 0.40–1.20)
Chloride: 105 mEq/L (ref 96–112)
GFR: 50.87 mL/min — ABNORMAL LOW (ref >60.00)
Glucose, Bld: 136 mg/dL — ABNORMAL HIGH (ref 70–99)
Potassium: 4.2 mEq/L (ref 3.5–5.1)
Sodium: 141 mEq/L (ref 135–145)

## 2018-09-17 LAB — CBC WITH DIFFERENTIAL/PLATELET
Basophils Absolute: 0 10*3/uL (ref 0.0–0.1)
Basophils Relative: 0.3 % (ref 0.0–3.0)
Eosinophils Absolute: 0.2 10*3/uL (ref 0.0–0.7)
Eosinophils Relative: 2.9 % (ref 0.0–5.0)
HCT: 34.4 % — ABNORMAL LOW (ref 36.0–46.0)
Hemoglobin: 11.2 g/dL — ABNORMAL LOW (ref 12.0–15.0)
Lymphocytes Relative: 29.4 % (ref 12.0–46.0)
Lymphs Abs: 2.3 10*3/uL (ref 0.7–4.0)
MCHC: 32.7 g/dL (ref 30.0–36.0)
MCV: 85.8 fl (ref 78.0–100.0)
MONOS PCT: 10.8 % (ref 3.0–12.0)
Monocytes Absolute: 0.8 10*3/uL (ref 0.1–1.0)
Neutro Abs: 4.5 10*3/uL (ref 1.4–7.7)
Neutrophils Relative %: 56.6 % (ref 43.0–77.0)
Platelets: 366 10*3/uL (ref 150.0–400.0)
RBC: 4.01 Mil/uL (ref 3.87–5.11)
RDW: 14.1 % (ref 11.5–15.5)
WBC: 7.9 10*3/uL (ref 4.0–10.5)

## 2018-09-17 LAB — HEMOGLOBIN A1C: Hgb A1c MFr Bld: 9 % — ABNORMAL HIGH (ref 4.6–6.5)

## 2018-09-17 LAB — TSH: TSH: 2.14 u[IU]/mL (ref 0.35–4.50)

## 2018-09-17 NOTE — Assessment & Plan Note (Signed)

## 2018-09-17 NOTE — Assessment & Plan Note (Signed)
Seeing endo, for labs today

## 2018-09-17 NOTE — Progress Notes (Signed)
Subjective:    Patient ID: Gail Lynch, female    DOB: 07/19/1941, 78 y.o.   MRN: [id number]  HPI  Here for wellness and f/u;  Overall doing ok;  Pt denies Chest pain, worsening SOB, DOE, wheezing, orthopnea, PND, worsening LE edema, palpitations, dizziness or syncope.  Pt denies neurological change such as new headache, facial or extremity weakness.  Pt denies polydipsia, polyuria, or low sugar symptoms. Pt states overall good compliance with treatment and medications, good tolerability, and has been trying to follow appropriate diet.  Pt denies worsening depressive symptoms, suicidal ideation or panic. No fever, night sweats, wt loss, loss of appetite, or other constitutional symptoms.  Pt states good ability with ADL's, has low fall risk, home safety reviewed and adequate, no other significant changes in hearing or vision, and not active with exercise. Did have fall about 2 wks ago, with bruising and swelling to left forehead, now resolved, but not sure why fell, may have been more off balance recently, but refusing cane or PT for now Past Medical History:  Diagnosis Date  . Arthritis    gout in feet  . Asthma    hx as a child - no inhaler, no problems  . Carotid stenosis 06/18/2012  . Colon polyps   . Diabetes mellitus    Type 2  . Dysplasia of cervix, low grade (CIN 1)    s/p cervical conization  . Glaucoma   . Glaucoma 12/18/2011  . Hard of hearing    hear aids - left  . HPV (human papilloma virus) infection   . Hyperlipidemia   . Hypertension   . Intrinsic asthma, unspecified 12/18/2011  . Mild aortic stenosis 03/14/2017  . Mycotic toenails 02/19/2013  . PONV (postoperative nausea and vomiting)   . SVD (spontaneous vaginal delivery)    x 4   Past Surgical History:  Procedure Laterality Date  . BREAST BIOPSY  2010  . BREAST BIOPSY  1991  . COLONOSCOPY    . DILATION AND CURETTAGE OF UTERUS  2000  . HYSTEROSCOPY W/D&C N/A 03/04/2014   Procedure: DILATATION AND CURETTAGE  /HYSTEROSCOPY;  Surgeon: Lavonia Drafts, MD;  Location: [facility];  Service: Gynecology;  Laterality: N/A;  . KNEE SURGERY  2010   patella fracture  . MULTIPLE TOOTH EXTRACTIONS    . POLYPECTOMY      reports that she quit smoking about 38 years ago. Her smoking use included cigarettes. She has quit using smokeless tobacco. She reports that she does not drink alcohol or use drugs. family history includes Asthma in an other family member; Diabetes in her brother; Heart disease in her brother, father, and mother. Allergies  Allergen Reactions  . Aspirin Other (See Comments)    BLEEDING BEHIND EYES  . Penicillin G Other (See Comments)    Unknown rxn DID THE REACTION INVOLVE: Swelling of the face/tongue/throat, SOB, or low BP? Unknown Sudden or severe rash/hives, skin peeling, or the inside of the mouth or nose? Unknown Did it require medical treatment? Unknown When did it last happen?Unknown If all above answers are "NO", may proceed with cephalosporin use.  Marland Kitchen Penicillins Other (See Comments)    Unknown rxn DID THE REACTION INVOLVE: Swelling of the face/tongue/throat, SOB, or low BP? Unknown Sudden or severe rash/hives, skin peeling, or the inside of the mouth or nose? Unknown Did it require medical treatment? Unknown When did it last happen?Unknown If all above answers are "NO", may proceed with cephalosporin use.   Current Outpatient Medications  on File Prior to Visit  Medication Sig Dispense Refill  . acetaminophen (TYLENOL) 500 MG tablet Take 1,000 mg by mouth every 6 (six) hours as needed for moderate pain.     Marland Kitchen alendronate (FOSAMAX) 70 MG tablet Take 1 tablet (70 mg total) by mouth every 7 (seven) days. Take with a full glass of water on an empty stomach. 12 tablet 3  . allopurinol (ZYLOPRIM) 300 MG tablet Take 1 tablet (300 mg total) by mouth 2 (two) times daily. Must keep 11/15/16 appt for future refills 90 tablet 3  . B Complex Vitamins (VITAMIN B COMPLEX) TABS Take 1  tablet by mouth at bedtime.     . Cholecalciferol (VITAMIN D3) 1000 UNITS CAPS Take 1,000 Units by mouth daily.     . Flaxseed, Linseed, (FLAX SEED OIL) 1000 MG CAPS Take 2,000 mg by mouth daily.     . Garlic 0867 MG CAPS Take 1,000 mg by mouth daily.     Marland Kitchen glucose blood (ONE TOUCH ULTRA TEST) test strip 1 each by Other route 2 (two) times daily. And lancets 2/day 250.01 180 each 3  . hydroxypropyl methylcellulose / hypromellose (ISOPTO TEARS / GONIOVISC) 2.5 % ophthalmic solution Place 1 drop into both eyes 3 (three) times daily as needed for dry eyes.    . insulin glargine (LANTUS) 100 UNIT/ML injection Inject 0.55 mLs (55 Units total) into the skin every morning. And pen needles 1/day 20 mL 3  . polyethylene glycol (MIRALAX / GLYCOLAX) packet Take 17 g by mouth daily as needed for mild constipation.     . rosuvastatin (CRESTOR) 40 MG tablet Take 1 tablet (40 mg total) by mouth daily. 90 tablet 3  . trimethoprim-polymyxin b (POLYTRIM) ophthalmic solution Place 1 drop into the left eye 4 (four) times daily.    . valsartan-hydrochlorothiazide (DIOVAN-HCT) 320-12.5 MG tablet Take 1 tablet by mouth daily. 90 tablet 3  . vitamin E 400 UNIT capsule Take 400 Units by mouth daily.     No current facility-administered medications on file prior to visit.    Review of Systems Constitutional: Negative for other unusual diaphoresis, sweats, appetite or weight changes HENT: Negative for other worsening hearing loss, ear pain, facial swelling, mouth sores or neck stiffness.   Eyes: Negative for other worsening pain, redness or other visual disturbance.  Respiratory: Negative for other stridor or swelling Cardiovascular: Negative for other palpitations or other chest pain  Gastrointestinal: Negative for worsening diarrhea or loose stools, blood in stool, distention or other pain Genitourinary: Negative for hematuria, flank pain or other change in urine volume.  Musculoskeletal: Negative for myalgias or  other joint swelling.  Skin: Negative for other color change, or other wound or worsening drainage.  Neurological: Negative for other syncope or numbness. Hematological: Negative for other adenopathy or swelling Psychiatric/Behavioral: Negative for hallucinations, other worsening agitation, SI, self-injury, or new decreased concentration All other system neg per pt    Objective:   Physical Exam BP 122/76   Pulse 73   Temp 98.5 F (36.9 C) (Oral)   Ht 5\' 2"  (1.575 m)   Wt 211 lb (95.7 kg)   SpO2 95%   BMI 38.59 kg/m  VS noted,  Constitutional: Pt is oriented to person, place, and time. Appears well-developed and well-nourished, in no significant distress and comfortable Head: Normocephalic and atraumatic  Eyes: Conjunctivae and EOM are normal. Pupils are equal, round, and reactive to light Right Ear: External ear normal without discharge Left Ear: External ear normal  without discharge Nose: Nose without discharge or deformity Mouth/Throat: Oropharynx is without other ulcerations and moist  Neck: Normal range of motion. Neck supple. No JVD present. No tracheal deviation present or significant neck LA or mass Cardiovascular: Normal rate, regular rhythm, normal heart sounds and intact distal pulses.   Pulmonary/Chest: WOB normal and breath sounds without rales or wheezing  Abdominal: Soft. Bowel sounds are normal. NT. No HSM  Musculoskeletal: Normal range of motion. Exhibits trace bilat leg edema, chornic Lymphadenopathy: Has no other cervical adenopathy.  Neurological: Pt is alert and oriented to person, place, and time. Pt has normal reflexes. No cranial nerve deficit. Motor grossly intact, Gait intact Skin: Skin is warm and dry. No rash noted or new ulcerations Psychiatric:  Has normal mood and affect. Behavior is normal without agitation No other exam findings Lab Results  Component Value Date   WBC 10.3 12/11/2017   HGB 11.5 (L) 12/11/2017   HCT 36.4 12/11/2017   PLT 354  12/11/2017   GLUCOSE 123 (H) 06/04/2018   CHOL 188 06/04/2018   TRIG 128.0 06/04/2018   HDL 39.00 (L) 06/04/2018   LDLDIRECT 152.9 12/18/2011   LDLCALC 124 (H) 06/04/2018   ALT 15 06/04/2018   AST 13 06/04/2018   NA 142 06/04/2018   K 4.0 06/04/2018   CL 107 06/04/2018   CREATININE 1.28 (H) 06/04/2018   BUN 22 06/04/2018   CO2 27 06/04/2018   TSH 2.63 11/21/2017   INR 1.1 (H) 09/08/2013   HGBA1C 8.1 (A) 08/05/2018   MICROALBUR 2.6 (H) 11/21/2017        Assessment & Plan:

## 2018-09-17 NOTE — Patient Instructions (Signed)
Please continue all other medications as before, and refills have been done if requested.  Please have the pharmacy call with any other refills you may need.  Please continue your efforts at being more active, low cholesterol diet, and weight control.  You are otherwise up to date with prevention measures today.  Please keep your appointments with your specialists as you may have planned  Please go to the LAB in the Basement (turn left off the elevator) for the tests to be done today  You will be contacted by phone if any changes need to be made immediately.  Otherwise, you will receive a letter about your results with an explanation, but please check with MyChart first.  Please remember to sign up for MyChart if you have not done so, as this will be important to you in the future with finding out test results, communicating by private email, and scheduling acute appointments online when needed.  Please return in 6 months, or sooner if needed, with Lab testing done 3-5 days before  OK to cancel the April 2020 appt

## 2018-09-30 ENCOUNTER — Encounter (INDEPENDENT_AMBULATORY_CARE_PROVIDER_SITE_OTHER): Payer: Medicare Other | Admitting: Ophthalmology

## 2018-09-30 DIAGNOSIS — I10 Essential (primary) hypertension: Secondary | ICD-10-CM | POA: Diagnosis not present

## 2018-09-30 DIAGNOSIS — H4312 Vitreous hemorrhage, left eye: Secondary | ICD-10-CM | POA: Diagnosis not present

## 2018-09-30 DIAGNOSIS — E113512 Type 2 diabetes mellitus with proliferative diabetic retinopathy with macular edema, left eye: Secondary | ICD-10-CM

## 2018-09-30 DIAGNOSIS — H43813 Vitreous degeneration, bilateral: Secondary | ICD-10-CM

## 2018-09-30 DIAGNOSIS — H35033 Hypertensive retinopathy, bilateral: Secondary | ICD-10-CM

## 2018-09-30 DIAGNOSIS — E113311 Type 2 diabetes mellitus with moderate nonproliferative diabetic retinopathy with macular edema, right eye: Secondary | ICD-10-CM | POA: Diagnosis not present

## 2018-09-30 DIAGNOSIS — R6889 Other general symptoms and signs: Secondary | ICD-10-CM | POA: Diagnosis not present

## 2018-09-30 DIAGNOSIS — E11311 Type 2 diabetes mellitus with unspecified diabetic retinopathy with macular edema: Secondary | ICD-10-CM | POA: Diagnosis not present

## 2018-10-18 ENCOUNTER — Ambulatory Visit (INDEPENDENT_AMBULATORY_CARE_PROVIDER_SITE_OTHER): Payer: Medicare Other | Admitting: Podiatry

## 2018-10-18 ENCOUNTER — Encounter: Payer: Self-pay | Admitting: Podiatry

## 2018-10-18 DIAGNOSIS — Q6650 Congenital pes planus, unspecified foot: Secondary | ICD-10-CM

## 2018-10-18 DIAGNOSIS — B351 Tinea unguium: Secondary | ICD-10-CM | POA: Diagnosis not present

## 2018-10-18 DIAGNOSIS — R6889 Other general symptoms and signs: Secondary | ICD-10-CM | POA: Diagnosis not present

## 2018-10-18 DIAGNOSIS — M79676 Pain in unspecified toe(s): Secondary | ICD-10-CM | POA: Diagnosis not present

## 2018-10-18 DIAGNOSIS — E1151 Type 2 diabetes mellitus with diabetic peripheral angiopathy without gangrene: Secondary | ICD-10-CM | POA: Diagnosis not present

## 2018-10-18 NOTE — Progress Notes (Signed)
Patient ID: Gail Lynch, female   DOB: [birth year], 78 y.o.   MRN: [id number] Complaint:  Visit Type: Patient returns to my office for continued preventative foot care services. Complaint: Patient states" my nails have grown long and thick and become painful to walk and wear shoes" Patient has been diagnosed with DM with neuropathy and angiopathy.. The patient presents for preventative foot care services. No changes to ROS  Podiatric Exam: Vascular: dorsalis pedis and posterior tibial pulses are not  palpable bilateral. Capillary return is immediate. Temperature gradient is WNL. Skin turgor WNL  Sensorium: Normal Semmes Weinstein monofilament test. Normal tactile sensation bilaterally. Nail Exam: Pt has thick disfigured discolored nails with subungual debris noted bilateral entire nail hallux through fifth toenails Ulcer Exam: There is no evidence of ulcer or pre-ulcerative changes or infection. Orthopedic Exam: Muscle tone and strength are WNL. No limitations in general ROM. No crepitus or effusions noted. Foot type and digits show no abnormalities. Bony prominences are unremarkable. Pes planus Skin: No Porokeratosis. No infection or ulcers.  Callus right heel,  Asymptomatic..  Diagnosis:  Onychomycosis, , Pain in right toe, pain in left toes  Treatment & Plan Procedures and Treatment: Consent by patient was obtained for treatment procedures. The patient understood the discussion of treatment and procedures well. All questions were answered thoroughly reviewed. Debridement of mycotic and hypertrophic toenails, 1 through 5 bilateral and clearing of subungual debris. No ulceration, no infection noted.  Return Visit-Office Procedure: Patient instructed to return to the office for a follow up visit 3 months for continued evaluation and treatment.   Gardiner Barefoot DPM

## 2018-10-21 ENCOUNTER — Encounter (INDEPENDENT_AMBULATORY_CARE_PROVIDER_SITE_OTHER): Payer: Medicare Other | Admitting: Ophthalmology

## 2018-10-21 DIAGNOSIS — E113513 Type 2 diabetes mellitus with proliferative diabetic retinopathy with macular edema, bilateral: Secondary | ICD-10-CM | POA: Diagnosis not present

## 2018-10-21 DIAGNOSIS — E11311 Type 2 diabetes mellitus with unspecified diabetic retinopathy with macular edema: Secondary | ICD-10-CM

## 2018-10-21 DIAGNOSIS — R6889 Other general symptoms and signs: Secondary | ICD-10-CM | POA: Diagnosis not present

## 2018-10-28 ENCOUNTER — Encounter (INDEPENDENT_AMBULATORY_CARE_PROVIDER_SITE_OTHER): Payer: Medicare Other | Admitting: Ophthalmology

## 2018-10-28 DIAGNOSIS — E113311 Type 2 diabetes mellitus with moderate nonproliferative diabetic retinopathy with macular edema, right eye: Secondary | ICD-10-CM | POA: Diagnosis not present

## 2018-10-28 DIAGNOSIS — E11311 Type 2 diabetes mellitus with unspecified diabetic retinopathy with macular edema: Secondary | ICD-10-CM | POA: Diagnosis not present

## 2018-10-28 DIAGNOSIS — I10 Essential (primary) hypertension: Secondary | ICD-10-CM

## 2018-10-28 DIAGNOSIS — E113512 Type 2 diabetes mellitus with proliferative diabetic retinopathy with macular edema, left eye: Secondary | ICD-10-CM

## 2018-10-28 DIAGNOSIS — H35033 Hypertensive retinopathy, bilateral: Secondary | ICD-10-CM

## 2018-10-28 DIAGNOSIS — R6889 Other general symptoms and signs: Secondary | ICD-10-CM | POA: Diagnosis not present

## 2018-10-28 DIAGNOSIS — H43813 Vitreous degeneration, bilateral: Secondary | ICD-10-CM

## 2018-11-18 DIAGNOSIS — E119 Type 2 diabetes mellitus without complications: Secondary | ICD-10-CM | POA: Diagnosis not present

## 2018-11-18 DIAGNOSIS — Z794 Long term (current) use of insulin: Secondary | ICD-10-CM | POA: Diagnosis not present

## 2018-12-05 ENCOUNTER — Encounter (INDEPENDENT_AMBULATORY_CARE_PROVIDER_SITE_OTHER): Payer: Medicare Other | Admitting: Ophthalmology

## 2018-12-05 ENCOUNTER — Other Ambulatory Visit: Payer: Self-pay

## 2018-12-05 ENCOUNTER — Ambulatory Visit: Payer: Medicare Other | Admitting: Internal Medicine

## 2018-12-05 DIAGNOSIS — E113391 Type 2 diabetes mellitus with moderate nonproliferative diabetic retinopathy without macular edema, right eye: Secondary | ICD-10-CM

## 2018-12-05 DIAGNOSIS — H35033 Hypertensive retinopathy, bilateral: Secondary | ICD-10-CM

## 2018-12-05 DIAGNOSIS — H43813 Vitreous degeneration, bilateral: Secondary | ICD-10-CM

## 2018-12-05 DIAGNOSIS — E113512 Type 2 diabetes mellitus with proliferative diabetic retinopathy with macular edema, left eye: Secondary | ICD-10-CM

## 2018-12-05 DIAGNOSIS — E11311 Type 2 diabetes mellitus with unspecified diabetic retinopathy with macular edema: Secondary | ICD-10-CM | POA: Diagnosis not present

## 2018-12-05 DIAGNOSIS — R6889 Other general symptoms and signs: Secondary | ICD-10-CM | POA: Diagnosis not present

## 2018-12-05 DIAGNOSIS — I10 Essential (primary) hypertension: Secondary | ICD-10-CM

## 2018-12-06 ENCOUNTER — Encounter: Payer: Self-pay | Admitting: Endocrinology

## 2018-12-09 ENCOUNTER — Other Ambulatory Visit: Payer: Self-pay

## 2018-12-09 ENCOUNTER — Ambulatory Visit (INDEPENDENT_AMBULATORY_CARE_PROVIDER_SITE_OTHER): Payer: Medicare Other | Admitting: Endocrinology

## 2018-12-09 DIAGNOSIS — E1129 Type 2 diabetes mellitus with other diabetic kidney complication: Secondary | ICD-10-CM

## 2018-12-09 DIAGNOSIS — E11319 Type 2 diabetes mellitus with unspecified diabetic retinopathy without macular edema: Secondary | ICD-10-CM | POA: Diagnosis not present

## 2018-12-09 DIAGNOSIS — Z794 Long term (current) use of insulin: Secondary | ICD-10-CM | POA: Diagnosis not present

## 2018-12-09 MED ORDER — INSULIN GLARGINE 100 UNIT/ML ~~LOC~~ SOLN: 50.0000 [IU] | SUBCUTANEOUS | 3 refills | Status: DC

## 2018-12-09 MED ORDER — INSULIN GLARGINE 100 UNIT/ML SOLOSTAR PEN: 55.0000 [IU] | PEN_INJECTOR | SUBCUTANEOUS | 99 refills | Status: DC

## 2018-12-09 NOTE — Progress Notes (Addendum)
Subjective:    Patient ID: Gail Lynch, female    DOB: 1940/08/23, 78 y.o.   MRN: [id number]  HPI  telehealth visit today via telephone x 10 minutes.  Alternatives to telehealth are presented to this patient, and the patient agrees to the telehealth visit. Pt is advised of the cost of the visit, and agrees to this, also.   Patient is at home, and I am at the office.   Alternatives to telehealth are presented to this patient, and the patient agrees.   Patient is at home, and I am at the office.   Pt returns for f/u of diabetes mellitus: DM type: Insulin-requiring type 2. Dx'ed: 4098 Complications: polyneuropathy, renal insuff, and retinopathy.   Therapy: insulin since soon after dx.    GDM: never. DKA: never Severe hypoglycemia: never.   Pancreatitis: never.   Other: she declines multiple daily injections; she declines to increase frequency of injections; she changed from lantus to NPH, due to pattern of cbg's. However, she chose to resume lantus Interval history: she brings a record of her cbg's which I have reviewed today.  It varies from 84-320. It is in general higher as the day goes on.  pt states she feels well in general.  She says she misses the insulin approx once per month, but she takes just 50 units QD, but she sometimes takes later in the day.  Past Medical History:  Diagnosis Date  . Arthritis    gout in feet  . Asthma    hx as a child - no inhaler, no problems  . Carotid stenosis 06/18/2012  . Colon polyps   . Diabetes mellitus    Type 2  . Dysplasia of cervix, low grade (CIN 1)    s/p cervical conization  . Glaucoma   . Glaucoma 12/18/2011  . Hard of hearing    hear aids - left  . HPV (human papilloma virus) infection   . Hyperlipidemia   . Hypertension   . Intrinsic asthma, unspecified 12/18/2011  . Mild aortic stenosis 03/14/2017  . Mycotic toenails 02/19/2013  . PONV (postoperative nausea and vomiting)   . SVD (spontaneous vaginal delivery)    x 4     Past Surgical History:  Procedure Laterality Date  . BREAST BIOPSY  2010  . BREAST BIOPSY  1991  . COLONOSCOPY    . DILATION AND CURETTAGE OF UTERUS  2000  . HYSTEROSCOPY W/D&C N/A 03/04/2014   Procedure: DILATATION AND CURETTAGE /HYSTEROSCOPY;  Surgeon: Lavonia Drafts, MD;  Location: [facility];  Service: Gynecology;  Laterality: N/A;  . KNEE SURGERY  2010   patella fracture  . MULTIPLE TOOTH EXTRACTIONS    . POLYPECTOMY      Social History   Socioeconomic History  . Marital status: Widowed    Spouse name: Not on file  . Number of children: 3  . Years of education: Not on file  . Highest education level: Not on file  Occupational History  . Occupation: retired  Scientific laboratory technician  . Financial resource strain: Not on file  . Food insecurity:    Worry: Not on file    Inability: Not on file  . Transportation needs:    Medical: Not on file    Non-medical: Not on file  Tobacco Use  . Smoking status: Former Smoker    Types: Cigarettes    Last attempt to quit: 08/21/1980    Years since quitting: 38.3  . Smokeless tobacco: Former Network engineer and  Sexual Activity  . Alcohol use: No    Alcohol/week: 0.0 standard drinks  . Drug use: No  . Sexual activity: Yes    Birth control/protection: Post-menopausal  Lifestyle  . Physical activity:    Days per week: Not on file    Minutes per session: Not on file  . Stress: Not on file  Relationships  . Social connections:    Talks on phone: Not on file    Gets together: Not on file    Attends religious service: Not on file    Active member of club or organization: Not on file    Attends meetings of clubs or organizations: Not on file    Relationship status: Not on file  . Intimate partner violence:    Fear of current or ex partner: Not on file    Emotionally abused: Not on file    Physically abused: Not on file    Forced sexual activity: Not on file  Other Topics Concern  . Not on file  Social History Narrative  . Not on  file    Current Outpatient Medications on File Prior to Visit  Medication Sig Dispense Refill  . acetaminophen (TYLENOL) 500 MG tablet Take 1,000 mg by mouth every 6 (six) hours as needed for moderate pain.     Marland Kitchen alendronate (FOSAMAX) 70 MG tablet Take 1 tablet (70 mg total) by mouth every 7 (seven) days. Take with a full glass of water on an empty stomach. 12 tablet 3  . allopurinol (ZYLOPRIM) 300 MG tablet Take 1 tablet (300 mg total) by mouth 2 (two) times daily. Must keep 11/15/16 appt for future refills 90 tablet 3  . B Complex Vitamins (VITAMIN B COMPLEX) TABS Take 1 tablet by mouth at bedtime.     . Cholecalciferol (VITAMIN D3) 1000 UNITS CAPS Take 1,000 Units by mouth daily.     . Flaxseed, Linseed, (FLAX SEED OIL) 1000 MG CAPS Take 2,000 mg by mouth daily.     . Garlic 1601 MG CAPS Take 1,000 mg by mouth daily.     Marland Kitchen glucose blood (ONE TOUCH ULTRA TEST) test strip 1 each by Other route 2 (two) times daily. And lancets 2/day 250.01 180 each 3  . hydroxypropyl methylcellulose / hypromellose (ISOPTO TEARS / GONIOVISC) 2.5 % ophthalmic solution Place 1 drop into both eyes 3 (three) times daily as needed for dry eyes.    . polyethylene glycol (MIRALAX / GLYCOLAX) packet Take 17 g by mouth daily as needed for mild constipation.     . rosuvastatin (CRESTOR) 40 MG tablet Take 1 tablet (40 mg total) by mouth daily. 90 tablet 3  . trimethoprim-polymyxin b (POLYTRIM) ophthalmic solution Place 1 drop into the left eye 4 (four) times daily.    . valsartan-hydrochlorothiazide (DIOVAN-HCT) 320-12.5 MG tablet Take 1 tablet by mouth daily. 90 tablet 3  . vitamin E 400 UNIT capsule Take 400 Units by mouth daily.     No current facility-administered medications on file prior to visit.     Allergies  Allergen Reactions  . Aspirin Other (See Comments)    BLEEDING BEHIND EYES  . Penicillin G Other (See Comments)    Unknown rxn DID THE REACTION INVOLVE: Swelling of the face/tongue/throat, SOB, or low  BP? Unknown Sudden or severe rash/hives, skin peeling, or the inside of the mouth or nose? Unknown Did it require medical treatment? Unknown When did it last happen?Unknown If all above answers are "NO", may proceed with cephalosporin use.  Marland Kitchen  Penicillins Other (See Comments)    Unknown rxn DID THE REACTION INVOLVE: Swelling of the face/tongue/throat, SOB, or low BP? Unknown Sudden or severe rash/hives, skin peeling, or the inside of the mouth or nose? Unknown Did it require medical treatment? Unknown When did it last happen?Unknown If all above answers are "NO", may proceed with cephalosporin use.    Family History  Problem Relation Age of Onset  . Diabetes Brother   . Heart disease Brother   . Heart disease Mother   . Heart disease Father   . Asthma Other   . Colon cancer Neg Hx   . Rectal cancer Neg Hx   . Stomach cancer Neg Hx   . Esophageal cancer Neg Hx     Review of Systems She denies hypoglycemia.      Objective:   Physical Exam      Assessment & Plan:  Insulin-requiring type 2 DM, with renal insuff: we discussed: she declines a1c today.   Renal insuff: she should have a faster-acting qd insulin, but she declines.   Patient Instructions  check your blood sugar twice a day.  vary the time of day when you check, between before the 3 meals, and at bedtime.  also check if you have symptoms of your blood sugar being too high or too low.  please keep a record of the readings and bring it to your next appointment here.  please call us sooner if your blood sugar goes below 70, or if it stays over 200.  As you write it out, please also write down why it might be higher or lower on a certain day.   Please take 55 units each morning, no matter what your blood sugar is.   On this type of insulin schedule, you should eat meals on a regular schedule (especially lunch).  If a meal is missed or significantly delayed, your blood sugar could go low.  Please come back for a  follow-up appointment in 2 months.

## 2018-12-09 NOTE — Patient Instructions (Addendum)
check your blood sugar twice a day.  vary the time of day when you check, between before the 3 meals, and at bedtime.  also check if you have symptoms of your blood sugar being too high or too low.  please keep a record of the readings and bring it to your next appointment here.  please call us sooner if your blood sugar goes below 70, or if it stays over 200.  As you write it out, please also write down why it might be higher or lower on a certain day.   Please take 55 units each morning, no matter what your blood sugar is.   On this type of insulin schedule, you should eat meals on a regular schedule (especially lunch).  If a meal is missed or significantly delayed, your blood sugar could go low.  Please come back for a follow-up appointment in 2 months.

## 2018-12-31 ENCOUNTER — Encounter (INDEPENDENT_AMBULATORY_CARE_PROVIDER_SITE_OTHER): Payer: Medicare Other | Admitting: Ophthalmology

## 2019-01-17 ENCOUNTER — Ambulatory Visit: Payer: Medicare Other | Admitting: Podiatry

## 2019-02-09 ENCOUNTER — Other Ambulatory Visit: Payer: Self-pay | Admitting: Internal Medicine

## 2019-02-18 DIAGNOSIS — E119 Type 2 diabetes mellitus without complications: Secondary | ICD-10-CM | POA: Diagnosis not present

## 2019-02-18 DIAGNOSIS — Z794 Long term (current) use of insulin: Secondary | ICD-10-CM | POA: Diagnosis not present

## 2019-02-19 ENCOUNTER — Ambulatory Visit: Payer: Medicare Other | Admitting: Endocrinology

## 2019-03-13 ENCOUNTER — Telehealth: Payer: Self-pay | Admitting: Endocrinology

## 2019-03-13 NOTE — Telephone Encounter (Signed)
OK 

## 2019-03-13 NOTE — Telephone Encounter (Signed)
Dr. Loanne Drilling is okay with pt having either a virtual visit or a phone visit. Please schedule which ever visit pt would be most comfortable with having.

## 2019-03-13 NOTE — Telephone Encounter (Signed)
Please advise how you would like to proceed 

## 2019-03-13 NOTE — Telephone Encounter (Signed)
Patient rescheduled appointment for 03/17/19  due to Covid 19 fears and transportation issues (it is difficult for patient to get transportation). Patient does not feel safe going out at this time due to Covid 19. Patient would like to know if her appointment on 04/15/19 can be a telephone appointment (MD to patient). Please call patient at ph# [phone] to advise.

## 2019-03-17 ENCOUNTER — Ambulatory Visit: Payer: Medicare Other | Admitting: Endocrinology

## 2019-03-18 ENCOUNTER — Ambulatory Visit: Payer: Medicare Other | Admitting: Internal Medicine

## 2019-04-04 ENCOUNTER — Encounter: Payer: Self-pay | Admitting: Podiatry

## 2019-04-04 ENCOUNTER — Ambulatory Visit (INDEPENDENT_AMBULATORY_CARE_PROVIDER_SITE_OTHER): Payer: Medicare Other | Admitting: Podiatry

## 2019-04-04 ENCOUNTER — Other Ambulatory Visit: Payer: Self-pay

## 2019-04-04 VITALS — Temp 97.7°F

## 2019-04-04 DIAGNOSIS — E1151 Type 2 diabetes mellitus with diabetic peripheral angiopathy without gangrene: Secondary | ICD-10-CM

## 2019-04-04 DIAGNOSIS — B351 Tinea unguium: Secondary | ICD-10-CM | POA: Diagnosis not present

## 2019-04-04 DIAGNOSIS — M79676 Pain in unspecified toe(s): Secondary | ICD-10-CM | POA: Diagnosis not present

## 2019-04-04 DIAGNOSIS — Q6650 Congenital pes planus, unspecified foot: Secondary | ICD-10-CM

## 2019-04-04 NOTE — Progress Notes (Signed)
Patient ID: Gail Lynch, female   DOB: 03/23/1941, 78 y.o.   MRN: [id number] Complaint:  Visit Type: Patient returns to my office for continued preventative foot care services. Complaint: Patient states" my nails have grown long and thick and become painful to walk and wear shoes" Patient has been diagnosed with DM with neuropathy and angiopathy.. The patient presents for preventative foot care services. No changes to ROS  Podiatric Exam: Vascular: dorsalis pedis and posterior tibial pulses are not  palpable bilateral. Capillary return is immediate. Temperature gradient is WNL. Skin turgor WNL  Sensorium: Normal Semmes Weinstein monofilament test. Normal tactile sensation bilaterally. Nail Exam: Pt has thick disfigured discolored nails with subungual debris noted bilateral entire nail hallux through fifth toenails Ulcer Exam: There is no evidence of ulcer or pre-ulcerative changes or infection. Orthopedic Exam: Muscle tone and strength are WNL. No limitations in general ROM. No crepitus or effusions noted. Foot type and digits show no abnormalities. Bony prominences are unremarkable. Pes planus Skin: No Porokeratosis. No infection or ulcers.  Callus right heel,  Asymptomatic..  Diagnosis:  Onychomycosis, , Pain in right toe, pain in left toes  Treatment & Plan Procedures and Treatment: Consent by patient was obtained for treatment procedures. The patient understood the discussion of treatment and procedures well. All questions were answered thoroughly reviewed. Debridement of mycotic and hypertrophic toenails, 1 through 5 bilateral and clearing of subungual debris. No ulceration, no infection noted.  Return Visit-Office Procedure: Patient instructed to return to the office for a follow up visit 3 months for continued evaluation and treatment.   Gardiner Barefoot DPM

## 2019-04-14 ENCOUNTER — Encounter: Payer: Self-pay | Admitting: Endocrinology

## 2019-04-15 ENCOUNTER — Ambulatory Visit (INDEPENDENT_AMBULATORY_CARE_PROVIDER_SITE_OTHER): Payer: Medicare Other | Admitting: Endocrinology

## 2019-04-15 ENCOUNTER — Other Ambulatory Visit: Payer: Self-pay

## 2019-04-15 VITALS — Ht 62.0 in

## 2019-04-15 DIAGNOSIS — E11319 Type 2 diabetes mellitus with unspecified diabetic retinopathy without macular edema: Secondary | ICD-10-CM

## 2019-04-15 DIAGNOSIS — Z794 Long term (current) use of insulin: Secondary | ICD-10-CM | POA: Diagnosis not present

## 2019-04-15 NOTE — Patient Instructions (Signed)
check your blood sugar twice a day.  vary the time of day when you check, between before the 3 meals, and at bedtime.  also check if you have symptoms of your blood sugar being too high or too low.  please keep a record of the readings and bring it to your next appointment here.  please call us sooner if your blood sugar goes below 70, or if it stays over 200.   Please come in to have the a1c checked.    On this type of insulin schedule, you should eat meals on a regular schedule (especially lunch).  If a meal is missed or significantly delayed, your blood sugar could go low.  Please come back for a follow-up appointment in 2 months.

## 2019-04-15 NOTE — Progress Notes (Signed)
Subjective:    Patient ID: Gail Lynch, female    DOB: 08-13-1941, 78 y.o.   MRN: [id number]  HPI telehealth visit today via telephone x 8 minutes.  Alternatives to telehealth are presented to this patient, and the patient agrees to the telehealth visit. Pt is advised of the cost of the visit, and agrees to this, also.   Patient is at home, and I am at the office.   Alternatives to telehealth are presented to this patient, and the patient agrees.   Patient is at home, and I am at the office.   Pt returns for f/u of diabetes mellitus: DM type: Insulin-requiring type 2. Dx'ed: 0000000 Complications: polyneuropathy, renal insuff, and retinopathy.   Therapy: insulin since soon after dx.    GDM: never. DKA: never Severe hypoglycemia: never.   Pancreatitis: never.   Other: she declines multiple daily injections; she declines to increase frequency of injections; she changed from lantus to NPH, due to pattern of cbg's. However, she chose to resume lantus Interval history: pt says cbg varies from 69-200's. It is in general higher as the day goes on.  pt states she feels well in general.  She says she never misses the insulin.  she takes 50 units QD.  Past Medical History:  Diagnosis Date  . Arthritis    gout in feet  . Asthma    hx as a child - no inhaler, no problems  . Carotid stenosis 06/18/2012  . Colon polyps   . Diabetes mellitus    Type 2  . Dysplasia of cervix, low grade (CIN 1)    s/p cervical conization  . Glaucoma   . Glaucoma 12/18/2011  . Hard of hearing    hear aids - left  . HPV (human papilloma virus) infection   . Hyperlipidemia   . Hypertension   . Intrinsic asthma, unspecified 12/18/2011  . Mild aortic stenosis 03/14/2017  . Mycotic toenails 02/19/2013  . PONV (postoperative nausea and vomiting)   . SVD (spontaneous vaginal delivery)    x 4    Past Surgical History:  Procedure Laterality Date  . BREAST BIOPSY  2010  . BREAST BIOPSY  1991  . COLONOSCOPY    .  DILATION AND CURETTAGE OF UTERUS  2000  . HYSTEROSCOPY W/D&C N/A 03/04/2014   Procedure: DILATATION AND CURETTAGE /HYSTEROSCOPY;  Surgeon: Lavonia Drafts, MD;  Location: [facility];  Service: Gynecology;  Laterality: N/A;  . KNEE SURGERY  2010   patella fracture  . MULTIPLE TOOTH EXTRACTIONS    . POLYPECTOMY      Social History   Socioeconomic History  . Marital status: Widowed    Spouse name: Not on file  . Number of children: 3  . Years of education: Not on file  . Highest education level: Not on file  Occupational History  . Occupation: retired  Scientific laboratory technician  . Financial resource strain: Not on file  . Food insecurity    Worry: Not on file    Inability: Not on file  . Transportation needs    Medical: Not on file    Non-medical: Not on file  Tobacco Use  . Smoking status: Former Smoker    Types: Cigarettes    Quit date: 08/21/1980    Years since quitting: 38.6  . Smokeless tobacco: Former Network engineer and Sexual Activity  . Alcohol use: No    Alcohol/week: 0.0 standard drinks  . Drug use: No  . Sexual activity: Yes  Birth control/protection: Post-menopausal  Lifestyle  . Physical activity    Days per week: Not on file    Minutes per session: Not on file  . Stress: Not on file  Relationships  . Social Herbalist on phone: Not on file    Gets together: Not on file    Attends religious service: Not on file    Active member of club or organization: Not on file    Attends meetings of clubs or organizations: Not on file    Relationship status: Not on file  . Intimate partner violence    Fear of current or ex partner: Not on file    Emotionally abused: Not on file    Physically abused: Not on file    Forced sexual activity: Not on file  Other Topics Concern  . Not on file  Social History Narrative  . Not on file    Current Outpatient Medications on File Prior to Visit  Medication Sig Dispense Refill  . acetaminophen (TYLENOL) 500 MG tablet  Take 1,000 mg by mouth every 6 (six) hours as needed for moderate pain.     Marland Kitchen alendronate (FOSAMAX) 70 MG tablet Take 1 tablet (70 mg total) by mouth every 7 (seven) days. Take with a full glass of water on an empty stomach. 12 tablet 3  . allopurinol (ZYLOPRIM) 300 MG tablet Take 1 tablet (300 mg total) by mouth 2 (two) times daily. Must keep 11/15/16 appt for future refills 90 tablet 3  . B Complex Vitamins (VITAMIN B COMPLEX) TABS Take 1 tablet by mouth at bedtime.     . Cholecalciferol (VITAMIN D3) 1000 UNITS CAPS Take 1,000 Units by mouth daily.     . Flaxseed, Linseed, (FLAX SEED OIL) 1000 MG CAPS Take 2,000 mg by mouth daily.     . Garlic 123XX123 MG CAPS Take 1,000 mg by mouth daily.     Marland Kitchen glucose blood (ONE TOUCH ULTRA TEST) test strip 1 each by Other route 2 (two) times daily. And lancets 2/day 250.01 180 each 3  . hydroxypropyl methylcellulose / hypromellose (ISOPTO TEARS / GONIOVISC) 2.5 % ophthalmic solution Place 1 drop into both eyes 3 (three) times daily as needed for dry eyes.    . Insulin Glargine (LANTUS SOLOSTAR) 100 UNIT/ML Solostar Pen Inject 55 Units into the skin every morning. 20 pen PRN  . polyethylene glycol (MIRALAX / GLYCOLAX) packet Take 17 g by mouth daily as needed for mild constipation.     . rosuvastatin (CRESTOR) 40 MG tablet Take 1 tablet (40 mg total) by mouth daily. 90 tablet 3  . trimethoprim-polymyxin b (POLYTRIM) ophthalmic solution Place 1 drop into the left eye 4 (four) times daily.    . valsartan-hydrochlorothiazide (DIOVAN-HCT) 320-12.5 MG tablet Take 1 tablet by mouth once daily 90 tablet 0  . vitamin E 400 UNIT capsule Take 400 Units by mouth daily.     No current facility-administered medications on file prior to visit.     Allergies  Allergen Reactions  . Aspirin Other (See Comments)    BLEEDING BEHIND EYES  . Penicillin G Other (See Comments)    Unknown rxn DID THE REACTION INVOLVE: Swelling of the face/tongue/throat, SOB, or low BP? Unknown  Sudden or severe rash/hives, skin peeling, or the inside of the mouth or nose? Unknown Did it require medical treatment? Unknown When did it last happen?Unknown If all above answers are "NO", may proceed with cephalosporin use.  Marland Kitchen Penicillins Other (See  Comments)    Unknown rxn DID THE REACTION INVOLVE: Swelling of the face/tongue/throat, SOB, or low BP? Unknown Sudden or severe rash/hives, skin peeling, or the inside of the mouth or nose? Unknown Did it require medical treatment? Unknown When did it last happen?Unknown If all above answers are "NO", may proceed with cephalosporin use.    Family History  Problem Relation Age of Onset  . Diabetes Brother   . Heart disease Brother   . Heart disease Mother   . Heart disease Father   . Asthma Other   . Colon cancer Neg Hx   . Rectal cancer Neg Hx   . Stomach cancer Neg Hx   . Esophageal cancer Neg Hx     Ht 5\' 2"  (1.575 m)   BMI 38.59 kg/m    Review of Systems Denies LOC    Objective:   Physical Exam      Assessment & Plan:  Insulin-requiring type 2 DM, with renal insuff: uncertain glycemic control.    Patient Instructions  check your blood sugar twice a day.  vary the time of day when you check, between before the 3 meals, and at bedtime.  also check if you have symptoms of your blood sugar being too high or too low.  please keep a record of the readings and bring it to your next appointment here.  please call us sooner if your blood sugar goes below 70, or if it stays over 200.   Please come in to have the a1c checked.    On this type of insulin schedule, you should eat meals on a regular schedule (especially lunch).  If a meal is missed or significantly delayed, your blood sugar could go low.  Please come back for a follow-up appointment in 2 months.

## 2019-05-20 ENCOUNTER — Other Ambulatory Visit: Payer: Self-pay | Admitting: Internal Medicine

## 2019-06-11 DIAGNOSIS — E119 Type 2 diabetes mellitus without complications: Secondary | ICD-10-CM | POA: Diagnosis not present

## 2019-06-11 DIAGNOSIS — Z794 Long term (current) use of insulin: Secondary | ICD-10-CM | POA: Diagnosis not present

## 2019-06-16 ENCOUNTER — Other Ambulatory Visit: Payer: Self-pay

## 2019-06-18 ENCOUNTER — Other Ambulatory Visit: Payer: Self-pay

## 2019-06-18 ENCOUNTER — Ambulatory Visit (INDEPENDENT_AMBULATORY_CARE_PROVIDER_SITE_OTHER): Payer: Medicare Other | Admitting: Endocrinology

## 2019-06-18 ENCOUNTER — Encounter: Payer: Self-pay | Admitting: Endocrinology

## 2019-06-18 VITALS — BP 134/60 | HR 52 | Ht 62.0 in | Wt 212.6 lb

## 2019-06-18 DIAGNOSIS — R6889 Other general symptoms and signs: Secondary | ICD-10-CM | POA: Diagnosis not present

## 2019-06-18 DIAGNOSIS — Z794 Long term (current) use of insulin: Secondary | ICD-10-CM

## 2019-06-18 DIAGNOSIS — E11319 Type 2 diabetes mellitus with unspecified diabetic retinopathy without macular edema: Secondary | ICD-10-CM | POA: Diagnosis not present

## 2019-06-18 LAB — POCT GLYCOSYLATED HEMOGLOBIN (HGB A1C): Hemoglobin A1C: 8.8 % — AB (ref 4.0–5.6)

## 2019-06-18 MED ORDER — HUMULIN N KWIKPEN 100 UNIT/ML ~~LOC~~ SUPN: 55.0000 [IU] | PEN_INJECTOR | SUBCUTANEOUS | 11 refills | Status: DC

## 2019-06-18 NOTE — Patient Instructions (Addendum)
check your blood sugar twice a day.  vary the time of day when you check, between before the 3 meals, and at bedtime.  also check if you have symptoms of your blood sugar being too high or too low.  please keep a record of the readings and bring it to your next appointment here.  please call us sooner if your blood sugar goes below 70, or if it stays over 200.  I have sent a prescription to your pharmacy, to change the insulin back to NPH.      On this type of insulin schedule, you should eat meals on a regular schedule (especially lunch).  If a meal is missed or significantly delayed, your blood sugar could go low.  Please come back for a follow-up appointment in 2-3 months.

## 2019-06-18 NOTE — Progress Notes (Signed)
Subjective:    Patient ID: Gail Lynch, female    DOB: 1940-10-16, 78 y.o.   MRN: [id number]  HPI Pt returns for f/u of diabetes mellitus: DM type: Insulin-requiring type 2. Dx'ed: 0000000 Complications: polyneuropathy, renal insuff, and retinopathy.   Therapy: insulin since soon after dx.    GDM: never. DKA: never Severe hypoglycemia: never.   Pancreatitis: never.   Other: she declines multiple daily injections; she declines to increase frequency of injections; she changed from lantus to NPH, due to pattern of cbg's. However, she chose to go back to lantus.   Interval history: pt says cbg varies from 69-300. It is in general higher as the day goes on.  pt states she feels well in general.  She says she never misses the insulin.  she has mild hypoglycemia approx twice per month.  This happens in the middle of the night.   Past Medical History:  Diagnosis Date  . Arthritis    gout in feet  . Asthma    hx as a child - no inhaler, no problems  . Carotid stenosis 06/18/2012  . Colon polyps   . Diabetes mellitus    Type 2  . Dysplasia of cervix, low grade (CIN 1)    s/p cervical conization  . Glaucoma   . Glaucoma 12/18/2011  . Hard of hearing    hear aids - left  . HPV (human papilloma virus) infection   . Hyperlipidemia   . Hypertension   . Intrinsic asthma, unspecified 12/18/2011  . Mild aortic stenosis 03/14/2017  . Mycotic toenails 02/19/2013  . PONV (postoperative nausea and vomiting)   . SVD (spontaneous vaginal delivery)    x 4    Past Surgical History:  Procedure Laterality Date  . BREAST BIOPSY  2010  . BREAST BIOPSY  1991  . COLONOSCOPY    . DILATION AND CURETTAGE OF UTERUS  2000  . HYSTEROSCOPY W/D&C N/A 03/04/2014   Procedure: DILATATION AND CURETTAGE /HYSTEROSCOPY;  Surgeon: Lavonia Drafts, MD;  Location: [facility];  Service: Gynecology;  Laterality: N/A;  . KNEE SURGERY  2010   patella fracture  . MULTIPLE TOOTH EXTRACTIONS    . POLYPECTOMY       Social History   Socioeconomic History  . Marital status: Widowed    Spouse name: Not on file  . Number of children: 3  . Years of education: Not on file  . Highest education level: Not on file  Occupational History  . Occupation: retired  Scientific laboratory technician  . Financial resource strain: Not on file  . Food insecurity    Worry: Not on file    Inability: Not on file  . Transportation needs    Medical: Not on file    Non-medical: Not on file  Tobacco Use  . Smoking status: Former Smoker    Types: Cigarettes    Quit date: 08/21/1980    Years since quitting: 38.8  . Smokeless tobacco: Former Network engineer and Sexual Activity  . Alcohol use: No    Alcohol/week: 0.0 standard drinks  . Drug use: No  . Sexual activity: Yes    Birth control/protection: Post-menopausal  Lifestyle  . Physical activity    Days per week: Not on file    Minutes per session: Not on file  . Stress: Not on file  Relationships  . Social Herbalist on phone: Not on file    Gets together: Not on file    Attends religious  service: Not on file    Active member of club or organization: Not on file    Attends meetings of clubs or organizations: Not on file    Relationship status: Not on file  . Intimate partner violence    Fear of current or ex partner: Not on file    Emotionally abused: Not on file    Physically abused: Not on file    Forced sexual activity: Not on file  Other Topics Concern  . Not on file  Social History Narrative  . Not on file    Current Outpatient Medications on File Prior to Visit  Medication Sig Dispense Refill  . acetaminophen (TYLENOL) 500 MG tablet Take 1,000 mg by mouth every 6 (six) hours as needed for moderate pain.     Marland Kitchen alendronate (FOSAMAX) 70 MG tablet Take 1 tablet (70 mg total) by mouth every 7 (seven) days. Take with a full glass of water on an empty stomach. 12 tablet 3  . allopurinol (ZYLOPRIM) 300 MG tablet Take 1 tablet (300 mg total) by mouth 2 (two)  times daily. Must keep 11/15/16 appt for future refills 90 tablet 3  . B Complex Vitamins (VITAMIN B COMPLEX) TABS Take 1 tablet by mouth at bedtime.     . Cholecalciferol (VITAMIN D3) 1000 UNITS CAPS Take 1,000 Units by mouth daily.     . Flaxseed, Linseed, (FLAX SEED OIL) 1000 MG CAPS Take 2,000 mg by mouth daily.     . Garlic 123XX123 MG CAPS Take 1,000 mg by mouth daily.     Marland Kitchen glucose blood (ONE TOUCH ULTRA TEST) test strip 1 each by Other route 2 (two) times daily. And lancets 2/day 250.01 180 each 3  . hydroxypropyl methylcellulose / hypromellose (ISOPTO TEARS / GONIOVISC) 2.5 % ophthalmic solution Place 1 drop into both eyes 3 (three) times daily as needed for dry eyes.    . polyethylene glycol (MIRALAX / GLYCOLAX) packet Take 17 g by mouth daily as needed for mild constipation.     . rosuvastatin (CRESTOR) 40 MG tablet Take 1 tablet (40 mg total) by mouth daily. 90 tablet 3  . trimethoprim-polymyxin b (POLYTRIM) ophthalmic solution Place 1 drop into the left eye 4 (four) times daily.    . valsartan-hydrochlorothiazide (DIOVAN-HCT) 320-12.5 MG tablet Take 1 tablet by mouth once daily 90 tablet 0  . vitamin E 400 UNIT capsule Take 400 Units by mouth daily.     No current facility-administered medications on file prior to visit.     Allergies  Allergen Reactions  . Aspirin Other (See Comments)    BLEEDING BEHIND EYES  . Penicillin G Other (See Comments)    Unknown rxn DID THE REACTION INVOLVE: Swelling of the face/tongue/throat, SOB, or low BP? Unknown Sudden or severe rash/hives, skin peeling, or the inside of the mouth or nose? Unknown Did it require medical treatment? Unknown When did it last happen?Unknown If all above answers are "NO", may proceed with cephalosporin use.  Marland Kitchen Penicillins Other (See Comments)    Unknown rxn DID THE REACTION INVOLVE: Swelling of the face/tongue/throat, SOB, or low BP? Unknown Sudden or severe rash/hives, skin peeling, or the inside of the mouth or  nose? Unknown Did it require medical treatment? Unknown When did it last happen?Unknown If all above answers are "NO", may proceed with cephalosporin use.    Family History  Problem Relation Age of Onset  . Diabetes Brother   . Heart disease Brother   . Heart disease  Mother   . Heart disease Father   . Asthma Other   . Colon cancer Neg Hx   . Rectal cancer Neg Hx   . Stomach cancer Neg Hx   . Esophageal cancer Neg Hx     BP 134/60 (BP Location: Right Arm, Patient Position: Sitting, Cuff Size: Large)   Pulse (!) 52   Ht 5\' 2"  (1.575 m)   Wt 212 lb 9.6 oz (96.4 kg)   SpO2 95%   BMI 38.89 kg/m    Review of Systems Denies LOC    Objective:   Physical Exam VITAL SIGNS:  See vs page GENERAL: no distress Pulses: dorsalis pedis intact bilat.   MSK: no deformity of the feet CV: 1+ bilat leg edema Skin:  no ulcer on the feet.  normal color and temp on the feet.  Neuro: sensation is intact to touch on the feet Ext: there is bilateral onychomycosis of the toenails.    Lab Results  Component Value Date   HGBA1C 8.8 (A) 06/18/2019   Lab Results  Component Value Date   TSH 2.14 09/17/2018       Assessment & Plan:  Insulin-requiring type 2 DM, with DR.  Renal insuff: based on the pattern of her cbg's, she needs a faster-acting qam insulin.  She agrees to go back to NPH Hypoglycemia: this limits aggressiveness of glycemic control.   Patient Instructions  check your blood sugar twice a day.  vary the time of day when you check, between before the 3 meals, and at bedtime.  also check if you have symptoms of your blood sugar being too high or too low.  please keep a record of the readings and bring it to your next appointment here.  please call us sooner if your blood sugar goes below 70, or if it stays over 200.  I have sent a prescription to your pharmacy, to change the insulin back to NPH.      On this type of insulin schedule, you should eat meals on a regular schedule  (especially lunch).  If a meal is missed or significantly delayed, your blood sugar could go low.  Please come back for a follow-up appointment in 2-3 months.

## 2019-07-11 ENCOUNTER — Ambulatory Visit: Payer: Medicare Other | Admitting: Podiatry

## 2019-09-18 ENCOUNTER — Ambulatory Visit: Payer: Medicare Other | Admitting: Endocrinology

## 2019-11-05 ENCOUNTER — Encounter: Payer: Self-pay | Admitting: Gastroenterology

## 2019-11-07 ENCOUNTER — Other Ambulatory Visit: Payer: Self-pay

## 2019-11-07 ENCOUNTER — Ambulatory Visit (INDEPENDENT_AMBULATORY_CARE_PROVIDER_SITE_OTHER): Payer: Medicare Other | Admitting: Podiatry

## 2019-11-07 ENCOUNTER — Encounter: Payer: Self-pay | Admitting: Podiatry

## 2019-11-07 DIAGNOSIS — Q6650 Congenital pes planus, unspecified foot: Secondary | ICD-10-CM | POA: Diagnosis not present

## 2019-11-07 DIAGNOSIS — B351 Tinea unguium: Secondary | ICD-10-CM | POA: Diagnosis not present

## 2019-11-07 DIAGNOSIS — E1151 Type 2 diabetes mellitus with diabetic peripheral angiopathy without gangrene: Secondary | ICD-10-CM | POA: Diagnosis not present

## 2019-11-07 DIAGNOSIS — M79676 Pain in unspecified toe(s): Secondary | ICD-10-CM | POA: Diagnosis not present

## 2019-11-07 NOTE — Progress Notes (Signed)
This patient returns to my office for at risk foot care.  This patient requires this care by a professional since this patient will be at risk due to having diabetes with neuropathy and CKD-3.  This patient is unable to cut nails herself since the patient cannot reach her nails.These nails are painful walking and wearing shoes.  This patient presents for at risk foot care today. Patient has not been seen in over 7 months.  General Appearance  Alert, conversant and in no acute stress.  Vascular  Dorsalis pedis and posterior tibial  pulses are not  palpable  bilaterally.  Capillary return is within normal limits  bilaterally. Temperature is within normal limits  bilaterally.  Neurologic  Senn-Weinstein monofilament wire test within normal limits  bilaterally. Muscle power within normal limits bilaterally.  Nails Thick disfigured discolored nails with subungual debris  from hallux to fifth toes bilaterally. No evidence of bacterial infection or drainage bilaterally.  Orthopedic  No limitations of motion  feet .  No crepitus or effusions noted.  No bony pathology or digital deformities noted.  Skin  normotropic skin with no porokeratosis noted bilaterally.  No signs of infections or ulcers noted.   Asymptomatic callus right heel.  Onychomycosis  Pain in right toes  Pain in left toes  Consent was obtained for treatment procedures.   Mechanical debridement of nails 1-5  bilaterally performed with a nail nipper.  Filed with dremel without incident. No infection or ulcer.     Return office visit   3 months                  Told patient to return for periodic foot care and evaluation due to potential at risk complications.   Gardiner Barefoot DPM

## 2019-12-17 ENCOUNTER — Ambulatory Visit: Payer: Medicare Other | Admitting: Podiatry

## 2020-02-02 ENCOUNTER — Telehealth: Payer: Self-pay | Admitting: Adult Health

## 2020-02-02 NOTE — Telephone Encounter (Signed)
Called patient for homebound vaccine screening (see separate note).  She reported her legs are swollen and she stopped taking her blood pressure medication because it made her stomach hurt.    I recommended she call your office, and let you know, but wanted to let you know about it also.    Thanks so much,  Wilber Bihari, NP

## 2020-02-02 NOTE — Telephone Encounter (Signed)
I connected by phone with [name] and/or patient's caregiver on 02/02/2020 at 10:08 AM to discuss the potential vaccination through our Homebound vaccination initiative.   Prevaccination Checklist for COVID-19 Vaccines  1.  Are you feeling sick today? no  2.  Have you ever received a dose of a COVID-19 vaccine?  no      If yes, which one? None   3.  Have you ever had an allergic reaction: (This would include a severe reaction [ e.g., anaphylaxis] that required treatment with epinephrine or EpiPen or that caused you to go to the hospital.  It would also include an allergic reaction that occurred within 4 hours that caused hives, swelling, or respiratory distress, including wheezing.) A.  A previous dose of COVID-19 vaccine. no  B.  A vaccine or injectable therapy that contains multiple components, one of which is a COVID-19 vaccine component, but it is not known which component elicited the immediate reaction. no  C.  Are you allergic to polyethylene glycol? no   4.  Have you ever had an allergic reaction to another vaccine (other than COVID-19 vaccine) or an injectable medication? (This would include a severe reaction [ e.g., anaphylaxis] that required treatment with epinephrine or EpiPen or that caused you to go to the hospital.  It would also include an allergic reaction that occurred within 4 hours that caused hives, swelling, or respiratory distress, including wheezing.)  no   5.  Have you ever had a severe allergic reaction (e.g., anaphylaxis) to something other than a component of the COVID-19 vaccine, or any vaccine or injectable medication?  This would include food, pet, venom, environmental, or oral medication allergies.  no   6.  Have you received any vaccine in the last 14 days? no   7.  Have you ever had a positive test for COVID-19 or has a doctor ever told you that you had COVID-19?  no   8.  Have you received passive antibody therapy (monoclonal antibodies or convalescent  serum) as a treatment for COVID-19? no   9.  Do you have a weakened immune system caused by something such as HIV infection or cancer or do you take immunosuppressive drugs or therapies?  no   10.  Do you have a bleeding disorder or are you taking a blood thinner? no   11.  Are you pregnant or breast-feeding? no   12.  Do you have dermal fillers? no   __________________   This patient is a 79 y.o. female that meets the FDA criteria to receive homebound vaccination. Patient or parent/caregiver understands they have the option to accept or refuse homebound vaccination.  Patient passed the pre-screening checklist and would like to proceed with homebound vaccination.  Based on questionnaire above, I recommend the patient be observed for 30 minutes.  There are an estimated #0 of other household members/caregivers who are also interested in receiving the vaccine.     I will send the patient's information to our scheduling team who will reach out to schedule the patient and potential caregiver/family members for homebound vaccination.   Patient is not taking blood pressure medication, because it made her stomach hurt.  I recommended she report this to her PCP.     Scot Dock 02/02/2020 10:08 AM

## 2020-02-06 ENCOUNTER — Ambulatory Visit: Payer: Medicare Other | Admitting: Podiatry

## 2020-03-09 ENCOUNTER — Ambulatory Visit (INDEPENDENT_AMBULATORY_CARE_PROVIDER_SITE_OTHER): Payer: Medicare Other | Admitting: Family

## 2020-03-09 ENCOUNTER — Encounter (HOSPITAL_COMMUNITY)
Admission: EM | Disposition: A | Discharge status: Home Health | Payer: Self-pay | Source: Home / Self Care | Attending: Emergency Medicine

## 2020-03-09 ENCOUNTER — Emergency Department (HOSPITAL_BASED_OUTPATIENT_CLINIC_OR_DEPARTMENT_OTHER): Payer: Medicare Other

## 2020-03-09 ENCOUNTER — Other Ambulatory Visit: Payer: Self-pay

## 2020-03-09 ENCOUNTER — Telehealth: Payer: Self-pay | Admitting: Family

## 2020-03-09 ENCOUNTER — Ambulatory Visit (HOSPITAL_COMMUNITY)
Admission: EM | Admit: 2020-03-09 | Discharge: 2020-03-10 | Disposition: A | Discharge status: Home Health | Payer: Medicare Other | Attending: Cardiology | Admitting: Cardiology

## 2020-03-09 VITALS — BP 140/56 | HR 32

## 2020-03-09 DIAGNOSIS — Z743 Need for continuous supervision: Secondary | ICD-10-CM | POA: Diagnosis not present

## 2020-03-09 DIAGNOSIS — I499 Cardiac arrhythmia, unspecified: Secondary | ICD-10-CM | POA: Diagnosis not present

## 2020-03-09 DIAGNOSIS — E119 Type 2 diabetes mellitus without complications: Secondary | ICD-10-CM | POA: Insufficient documentation

## 2020-03-09 DIAGNOSIS — R001 Bradycardia, unspecified: Secondary | ICD-10-CM | POA: Diagnosis not present

## 2020-03-09 DIAGNOSIS — Z794 Long term (current) use of insulin: Secondary | ICD-10-CM | POA: Insufficient documentation

## 2020-03-09 DIAGNOSIS — Z20822 Contact with and (suspected) exposure to covid-19: Secondary | ICD-10-CM | POA: Diagnosis not present

## 2020-03-09 DIAGNOSIS — Z95818 Presence of other cardiac implants and grafts: Secondary | ICD-10-CM

## 2020-03-09 DIAGNOSIS — I442 Atrioventricular block, complete: Secondary | ICD-10-CM | POA: Diagnosis not present

## 2020-03-09 DIAGNOSIS — Z79899 Other long term (current) drug therapy: Secondary | ICD-10-CM | POA: Insufficient documentation

## 2020-03-09 DIAGNOSIS — Z886 Allergy status to analgesic agent status: Secondary | ICD-10-CM | POA: Insufficient documentation

## 2020-03-09 DIAGNOSIS — I350 Nonrheumatic aortic (valve) stenosis: Secondary | ICD-10-CM | POA: Diagnosis not present

## 2020-03-09 DIAGNOSIS — R2681 Unsteadiness on feet: Secondary | ICD-10-CM | POA: Insufficient documentation

## 2020-03-09 DIAGNOSIS — R404 Transient alteration of awareness: Secondary | ICD-10-CM | POA: Diagnosis not present

## 2020-03-09 DIAGNOSIS — M6281 Muscle weakness (generalized): Secondary | ICD-10-CM | POA: Diagnosis not present

## 2020-03-09 DIAGNOSIS — R6889 Other general symptoms and signs: Secondary | ICD-10-CM | POA: Diagnosis not present

## 2020-03-09 DIAGNOSIS — Z880 Allergy status to penicillin: Secondary | ICD-10-CM | POA: Diagnosis not present

## 2020-03-09 DIAGNOSIS — Z9119 Patient's noncompliance with other medical treatment and regimen: Secondary | ICD-10-CM | POA: Insufficient documentation

## 2020-03-09 DIAGNOSIS — I5030 Unspecified diastolic (congestive) heart failure: Secondary | ICD-10-CM | POA: Diagnosis not present

## 2020-03-09 HISTORY — PX: PACEMAKER IMPLANT: EP1218

## 2020-03-09 LAB — ECHOCARDIOGRAM LIMITED
AR max vel: 1.66 cm2
AV Area VTI: 1.79 cm2
AV Area mean vel: 1.51 cm2
AV Mean grad: 4.6 mmHg
AV Peak grad: 10.1 mmHg
Ao pk vel: 1.59 m/s
Area-P 1/2: 5.27 cm2
S' Lateral: 2.4 cm

## 2020-03-09 LAB — TROPONIN I (HIGH SENSITIVITY)
Troponin I (High Sensitivity): 35 ng/L — ABNORMAL HIGH (ref <18)
Troponin I (High Sensitivity): 165 ng/L (ref <18)

## 2020-03-09 LAB — CBC WITH DIFFERENTIAL/PLATELET
Abs Immature Granulocytes: 0.01 10*3/uL (ref 0.00–0.07)
Basophils Absolute: 0 10*3/uL (ref 0.0–0.1)
Basophils Relative: 1 %
Eosinophils Absolute: 0 10*3/uL (ref 0.0–0.5)
Eosinophils Relative: 1 %
HCT: 39.1 % (ref 36.0–46.0)
Hemoglobin: 11.9 g/dL — ABNORMAL LOW (ref 12.0–15.0)
Immature Granulocytes: 0 %
Lymphocytes Relative: 20 %
Lymphs Abs: 1.3 10*3/uL (ref 0.7–4.0)
MCH: 27.5 pg (ref 26.0–34.0)
MCHC: 30.4 g/dL (ref 30.0–36.0)
MCV: 90.5 fL (ref 80.0–100.0)
Monocytes Absolute: 0.7 10*3/uL (ref 0.1–1.0)
Monocytes Relative: 11 %
Neutro Abs: 4.3 10*3/uL (ref 1.7–7.7)
Neutrophils Relative %: 67 %
Platelets: 186 10*3/uL (ref 150–400)
RBC: 4.32 MIL/uL (ref 3.87–5.11)
RDW: 13.9 % (ref 11.5–15.5)
WBC: 6.3 10*3/uL (ref 4.0–10.5)
nRBC: 0 % (ref 0.0–0.2)

## 2020-03-09 LAB — BASIC METABOLIC PANEL
Anion gap: 17 — ABNORMAL HIGH (ref 5–15)
BUN: 29 mg/dL — ABNORMAL HIGH (ref 8–23)
CO2: 20 mmol/L — ABNORMAL LOW (ref 22–32)
Calcium: 9.2 mg/dL (ref 8.9–10.3)
Chloride: 104 mmol/L (ref 98–111)
Creatinine, Ser: 2.04 mg/dL — ABNORMAL HIGH (ref 0.44–1.00)
GFR calc Af Amer: 26 mL/min — ABNORMAL LOW (ref >60)
GFR calc non Af Amer: 22 mL/min — ABNORMAL LOW (ref >60)
Glucose, Bld: 143 mg/dL — ABNORMAL HIGH (ref 70–99)
Potassium: 4.3 mmol/L (ref 3.5–5.1)
Sodium: 141 mmol/L (ref 135–145)

## 2020-03-09 LAB — SARS CORONAVIRUS 2 BY RT PCR (HOSPITAL ORDER, PERFORMED IN ~~LOC~~ HOSPITAL LAB): SARS Coronavirus 2: NEGATIVE

## 2020-03-09 LAB — GLUCOSE, CAPILLARY: Glucose-Capillary: 240 mg/dL — ABNORMAL HIGH (ref 70–99)

## 2020-03-09 LAB — HEMOGLOBIN A1C
Hgb A1c MFr Bld: 8.1 % — ABNORMAL HIGH (ref 4.8–5.6)
Mean Plasma Glucose: 185.77 mg/dL

## 2020-03-09 SURGERY — PACEMAKER IMPLANT

## 2020-03-09 MED ORDER — VALSARTAN-HYDROCHLOROTHIAZIDE 320-12.5 MG PO TABS: 1.0000 | ORAL_TABLET | Freq: Every day | ORAL | Status: DC

## 2020-03-09 MED ORDER — INSULIN ASPART 100 UNIT/ML ~~LOC~~ SOLN
0.0000 [IU] | Freq: Every day | SUBCUTANEOUS | Status: DC
  Administered 2020-03-09: 2 [IU] via SUBCUTANEOUS

## 2020-03-09 MED ORDER — HYDRALAZINE HCL 20 MG/ML IJ SOLN
INTRAMUSCULAR | Status: AC
  Filled 2020-03-09: qty 1

## 2020-03-09 MED ORDER — HEPARIN (PORCINE) IN NACL 1000-0.9 UT/500ML-% IV SOLN
INTRAVENOUS | Status: AC
  Filled 2020-03-09: qty 500

## 2020-03-09 MED ORDER — ACETAMINOPHEN 500 MG PO TABS: 1000.0000 mg | ORAL_TABLET | Freq: Four times a day (QID) | ORAL | Status: DC | PRN

## 2020-03-09 MED ORDER — IRBESARTAN 300 MG PO TABS: 300.0000 mg | ORAL_TABLET | Freq: Every day | ORAL | Status: DC

## 2020-03-09 MED ORDER — HYDRALAZINE HCL 20 MG/ML IJ SOLN
INTRAMUSCULAR | Status: DC | PRN
  Administered 2020-03-09: 10 mg via INTRAVENOUS

## 2020-03-09 MED ORDER — GLUCOSE BLOOD VI STRP: 1.0000 | ORAL_STRIP | Freq: Two times a day (BID) | Status: DC

## 2020-03-09 MED ORDER — LIDOCAINE HCL (PF) 1 % IJ SOLN
INTRAMUSCULAR | Status: AC
  Filled 2020-03-09: qty 60

## 2020-03-09 MED ORDER — ROSUVASTATIN CALCIUM 20 MG PO TABS
40.0000 mg | ORAL_TABLET | Freq: Every day | ORAL | Status: DC
  Administered 2020-03-09 – 2020-03-10 (×2): 40 mg via ORAL
  Filled 2020-03-09 (×2): qty 2

## 2020-03-09 MED ORDER — HYDRALAZINE HCL 20 MG/ML IJ SOLN
10.0000 mg | Freq: Four times a day (QID) | INTRAMUSCULAR | Status: DC | PRN
  Administered 2020-03-09 – 2020-03-10 (×2): 10 mg via INTRAVENOUS
  Filled 2020-03-09 (×2): qty 1

## 2020-03-09 MED ORDER — SODIUM CHLORIDE 0.9 % IV SOLN: INTRAVENOUS | Status: DC

## 2020-03-09 MED ORDER — HYDRALAZINE HCL 20 MG/ML IJ SOLN: 10.0000 mg | Freq: Once | INTRAMUSCULAR | Status: DC

## 2020-03-09 MED ORDER — LOPERAMIDE HCL 1 MG/7.5ML PO SUSP
1.0000 mg | Freq: Once | ORAL | Status: AC
  Administered 2020-03-09: 1 mg via ORAL
  Filled 2020-03-09 (×2): qty 7.5

## 2020-03-09 MED ORDER — INSULIN ASPART 100 UNIT/ML ~~LOC~~ SOLN
0.0000 [IU] | Freq: Three times a day (TID) | SUBCUTANEOUS | Status: DC
  Administered 2020-03-10: 8 [IU] via SUBCUTANEOUS
  Administered 2020-03-10: 3 [IU] via SUBCUTANEOUS

## 2020-03-09 MED ORDER — VANCOMYCIN HCL IN DEXTROSE 1-5 GM/200ML-% IV SOLN
1000.0000 mg | Freq: Two times a day (BID) | INTRAVENOUS | Status: AC
  Administered 2020-03-10: 1000 mg via INTRAVENOUS
  Filled 2020-03-09: qty 200

## 2020-03-09 MED ORDER — LIDOCAINE HCL (PF) 1 % IJ SOLN
INTRAMUSCULAR | Status: DC | PRN
  Administered 2020-03-09: 60 mL via SUBCUTANEOUS

## 2020-03-09 MED ORDER — SODIUM CHLORIDE 0.9 % IV SOLN
80.0000 mg | INTRAVENOUS | Status: DC
  Filled 2020-03-09: qty 2

## 2020-03-09 MED ORDER — HYDROCHLOROTHIAZIDE 12.5 MG PO CAPS: 12.5000 mg | ORAL_CAPSULE | Freq: Every day | ORAL | Status: DC

## 2020-03-09 MED ORDER — SODIUM CHLORIDE 0.9 % IV SOLN
INTRAVENOUS | Status: AC
  Filled 2020-03-09: qty 2

## 2020-03-09 MED ORDER — VANCOMYCIN HCL IN DEXTROSE 1-5 GM/200ML-% IV SOLN
INTRAVENOUS | Status: AC
  Filled 2020-03-09: qty 200

## 2020-03-09 MED ORDER — HEPARIN (PORCINE) IN NACL 1000-0.9 UT/500ML-% IV SOLN
INTRAVENOUS | Status: DC | PRN
  Administered 2020-03-09: 500 mL

## 2020-03-09 MED ORDER — SODIUM CHLORIDE 0.9 % IV SOLN: INTRAVENOUS | Status: DC | PRN

## 2020-03-09 MED ORDER — ACETAMINOPHEN 325 MG PO TABS
325.0000 mg | ORAL_TABLET | ORAL | Status: DC | PRN
  Administered 2020-03-09: 650 mg via ORAL

## 2020-03-09 MED ORDER — ONDANSETRON HCL 4 MG/2ML IJ SOLN
4.0000 mg | Freq: Four times a day (QID) | INTRAMUSCULAR | Status: DC | PRN
  Administered 2020-03-09: 4 mg via INTRAVENOUS
  Filled 2020-03-09: qty 2

## 2020-03-09 MED ORDER — VANCOMYCIN HCL IN DEXTROSE 1-5 GM/200ML-% IV SOLN
1000.0000 mg | INTRAVENOUS | Status: AC
  Administered 2020-03-09: 1000 mg via INTRAVENOUS

## 2020-03-09 MED ORDER — CHLORHEXIDINE GLUCONATE 4 % EX LIQD
60.0000 mL | Freq: Once | CUTANEOUS | Status: DC
  Filled 2020-03-09: qty 60

## 2020-03-09 MED ORDER — ALLOPURINOL 300 MG PO TABS
300.0000 mg | ORAL_TABLET | Freq: Two times a day (BID) | ORAL | Status: DC
  Administered 2020-03-09 – 2020-03-10 (×2): 300 mg via ORAL
  Filled 2020-03-09 (×2): qty 1

## 2020-03-09 MED ORDER — ALENDRONATE SODIUM 70 MG PO TABS: 70.0000 mg | ORAL_TABLET | ORAL | Status: DC

## 2020-03-09 SURGICAL SUPPLY — 8 items
CABLE SURGICAL S-101-97-12 (CABLE) ×3 IMPLANT
IPG PACE AZUR XT DR MRI W1DR01 (Pacemaker) IMPLANT
LEAD CAPSURE NOVUS 5076-52CM (Lead) ×2 IMPLANT
LEAD CAPSURE NOVUS 5076-58CM (Lead) ×2 IMPLANT
PACE AZURE XT DR MRI W1DR01 (Pacemaker) ×3 IMPLANT
PAD PRO RADIOLUCENT 2001M-C (PAD) ×3 IMPLANT
SHEATH 7FR PRELUDE SNAP 13 (SHEATH) ×4 IMPLANT
TRAY PACEMAKER INSERTION (PACKS) ×3 IMPLANT

## 2020-03-09 NOTE — Progress Notes (Signed)
Patient arrived to unit, left sided pacemaker site looks good, no bleeding or pain. Daughter at bedside, dinner ordered. Will continue to monitor patient.

## 2020-03-09 NOTE — Discharge Instructions (Signed)
After Your Pacemaker   . You have a Medtronic Pacemaker  ACTIVITY . Do not lift your arm above shoulder height for 1 week after your procedure. After 7 days, you may progress as below.     Tuesday March 16, 2020  Wednesday March 17, 2020 Thursday March 18, 2020 Friday March 19, 2020   . Do not lift, push, pull, or carry anything over 10 pounds with the affected arm until 6 weeks (Tuesday April 20, 2020 ) after your procedure.   . Do NOT DRIVE until you have been seen for your wound check, or as long as instructed by your healthcare provider.   . Ask your healthcare provider when you can go back to work   INCISION/Dressing . If you are on a blood thinner such as Coumadin, Xarelto, Eliquis, Plavix, or Pradaxa please confirm with your provider when this should be resumed.   . Monitor your Pacemaker site for redness, swelling, and drainage. Call the device clinic at [phone] if you experience these symptoms or fever/chills.  . If your incision is sealed with Steri-strips or staples, you may shower 10 days after your procedure or when told by your provider. Do not remove the steri-strips or let the shower hit directly on your site. You may wash around your site with soap and water.    Marland Kitchen Avoid lotions, ointments, or perfumes over your incision until it is well-healed.  . You may use a hot tub or a pool AFTER your wound check appointment if the incision is completely closed.  Marland Kitchen PAcemaker Alerts:  Some alerts are vibratory and others beep. These are NOT emergencies. Please call our office to let us know. If this occurs at night or on weekends, it can wait until the next business day. Send a remote transmission.  . If your device is capable of reading fluid status (for heart failure), you will be offered monthly monitoring to review this with you.   DEVICE MANAGEMENT . Remote monitoring is used to monitor your pacemaker from home. This monitoring is scheduled every 91 days by our office. It  allows [place] to keep an eye on the functioning of your device to ensure it is working properly. You will routinely see your Electrophysiologist annually (more often if necessary).   . You should receive your ID card for your new device in 4-8 weeks. Keep this card with you at all times once received. Consider wearing a medical alert bracelet or necklace.  . Your Pacemaker may be MRI compatible. This will be discussed at your next office visit/wound check.  You should avoid contact with strong electric or magnetic fields.    Do not use amateur (ham) radio equipment or electric (arc) welding torches. MP3 player headphones with magnets should not be used. Some devices are safe to use if held at least 12 inches (30 cm) from your Pacemaker. These include power tools, lawn mowers, and speakers. If you are unsure if something is safe to use, ask your health care provider.   When using your cell phone, hold it to the ear that is on the opposite side from the Pacemaker. Do not leave your cell phone in a pocket over the Pacemaker.   You may safely use electric blankets, heating pads, computers, and microwave ovens.  Call the office right away if:  You have chest pain.  You feel more short of breath than you have felt before.  You feel more light-headed than you have felt before.  Your  incision starts to open up.  This information is not intended to replace advice given to you by your health care provider. Make sure you discuss any questions you have with your health care provider.

## 2020-03-09 NOTE — Progress Notes (Signed)
Patients troponin 165, provider notified.

## 2020-03-09 NOTE — ED Notes (Signed)
ECHO at bedside.

## 2020-03-09 NOTE — H&P (Signed)
Gail Lynch has presented today for surgery, with the diagnosis of complete AV block.  The various methods of treatment have been discussed with the patient and family. After consideration of risks, benefits and other options for treatment, the patient has consented to  Procedure(s): Pacemaker implant as a surgical intervention .  Risks include but not limited to bleeding, tamponade, infection, pneumothorax, among others. The patient's history has been reviewed, patient examined, no change in status, stable for surgery.  I have reviewed the patient's chart and labs.  Questions were answered to the patient's satisfaction.    Joua Bake Curt Bears, MD 03/09/2020 4:25 PM

## 2020-03-09 NOTE — Progress Notes (Signed)
  Echocardiogram 2D Echocardiogram has been performed.  Gail Lynch 03/09/2020, 3:39 PM

## 2020-03-09 NOTE — ED Triage Notes (Signed)
Pt bib ems from MD office where pt was found to have HR of 31. Pt reports dizziness X1 week and BLE edema for 2 weeks. Pt stopped all meds approx 1 month ago. BP with EMS 238/66. 18g RAC. Pt alert and oriented X4. No neuro deficits noted.

## 2020-03-09 NOTE — Progress Notes (Signed)
Gail Lynch is a 79 y.o. female with the following history as recorded in EpicCare:  Patient Active Problem List   Diagnosis Date Noted  . Mild aortic stenosis 03/14/2017  . Peripheral edema 03/14/2017  . Arm heaviness 03/14/2017  . CKD (chronic kidney disease), stage III 05/20/2016  . Diabetes ([place]) 01/03/2016  . Dysuria 12/09/2014  . Peripheral neuropathy 12/05/2013  . Irregular heart beat 12/05/2013  . PVC (premature ventricular contraction) 12/05/2013  . Sciatica 06/17/2013  . Pain due to onychomycosis of toenail 05/21/2013  . Mycotic toenails   . Vagina bleeding 08/06/2012  . Carotid stenosis 06/18/2012  . Constipation 04/30/2012  . Childhood asthma 12/18/2011  . Glaucoma 12/18/2011  . Hyperlipidemia 12/18/2011  . Colon polyps 12/18/2011  . Preventative health care 12/17/2011  . Hypertension 12/17/2011  . Hard of hearing   . Dysplasia of cervix, low grade (CIN 1)   . HPV (human papilloma virus) infection   . Brain mass 09/18/2011    No current facility-administered medications for this visit.   No current outpatient medications on file.   Facility-Administered Medications Ordered in Other Visits  Medication Dose Route Frequency Provider Last Rate Last Admin  . 0.9 %  sodium chloride infusion   Intravenous Continuous Shirley Friar, PA-C 100 mL/hr at 03/09/20 1625 New Bag at 03/09/20 1625  . 0.9 %  sodium chloride infusion   Intravenous Continuous Shirley Friar, PA-C      . chlorhexidine (HIBICLENS) 4 % liquid 4 application  60 mL Topical Once Shirley Friar, PA-C      . gentamicin (GARAMYCIN) 80 mg in sodium chloride 0.9 % 500 mL irrigation  80 mg Irrigation On Call Shirley Friar, PA-C      . gentamicin (GARAMYCIN) 80 mg in sodium chloride 0.9 % 500 mL irrigation    PRN Constance Haw, MD   Given at 03/09/20 1828  . Heparin (Porcine) in NaCl 1000-0.9 UT/500ML-% SOLN    PRN Constance Haw, MD   500 mL at 03/09/20 1825   . [MAR Hold] hydrALAZINE (APRESOLINE) injection 10 mg  10 mg Intravenous Q6H PRN Shirley Friar, PA-C      . hydrALAZINE (APRESOLINE) injection    PRN Constance Haw, MD   10 mg at 03/09/20 1809  . vancomycin (VANCOCIN) IVPB 1000 mg/200 mL premix  1,000 mg Intravenous On Call Shirley Friar, PA-C 200 mL/hr at 03/09/20 1745 1,000 mg at 03/09/20 1745    Allergies: Aspirin, Penicillin g, and Penicillins  Past Medical History:  Diagnosis Date  . Arthritis    gout in feet  . Asthma    hx as a child - no inhaler, no problems  . Carotid stenosis 06/18/2012  . Colon polyps   . Diabetes mellitus    Type 2  . Dysplasia of cervix, low grade (CIN 1)    s/p cervical conization  . Glaucoma   . Glaucoma 12/18/2011  . Hard of hearing    hear aids - left  . HPV (human papilloma virus) infection   . Hyperlipidemia   . Hypertension   . Intrinsic asthma, unspecified 12/18/2011  . Mild aortic stenosis 03/14/2017  . Mycotic toenails 02/19/2013  . PONV (postoperative nausea and vomiting)   . SVD (spontaneous vaginal delivery)    x 4    Past Surgical History:  Procedure Laterality Date  . BREAST BIOPSY  2010  . BREAST BIOPSY  1991  . COLONOSCOPY    . DILATION AND  CURETTAGE OF UTERUS  2000  . HYSTEROSCOPY WITH D & C N/A 03/04/2014   Procedure: DILATATION AND CURETTAGE /HYSTEROSCOPY;  Surgeon: Lavonia Drafts, MD;  Location: [facility];  Service: Gynecology;  Laterality: N/A;  . KNEE SURGERY  2010   patella fracture  . MULTIPLE TOOTH EXTRACTIONS    . POLYPECTOMY      Family History  Problem Relation Age of Onset  . Diabetes Brother   . Heart disease Brother   . Heart disease Mother   . Heart disease Father   . Asthma Other   . Colon cancer Neg Hx   . Rectal cancer Neg Hx   . Stomach cancer Neg Hx   . Esophageal cancer Neg Hx     Social History   Tobacco Use  . Smoking status: Former Smoker    Types: Cigarettes    Quit date: 08/21/1980    Years since quitting:  39.5  . Smokeless tobacco: Former Network engineer Use Topics  . Alcohol use: No    Alcohol/week: 0.0 standard drinks    Subjective:  Brought to the office by her daughter who is concerned something "is wrong with my mom." Per daughter, her mother has been complaining of increased fatigue/ weakness recently; has not been seen her or with her endocrine for regular visits; patient readily admits she has stopped taking her insulin.   Objective:  Vitals:   03/09/20 1159  BP: (!) 140/56  Pulse: (!) 32    General: Well developed, well nourished, in no acute distress  Skin : Warm and dry.  Head: Normocephalic and atraumatic  Lungs: Respirations unlabored; clear to auscultation bilaterally without wheeze, rales, rhonchi  CVS exam:  regular rhythm, bradycardic at 32 Neurologic: Alert and oriented; speech intact; face symmetrical; moves all extremities well; CNII-XII intact without focal deficit   Assessment:  1. Bradycardia     Plan:  Life threatening condition; EKG show abnormal changes concerning for heart block; discussed with patient and daughter; needs immediate/ ER evaluation; they agree and ambulance is called for transport and admission; follow up to be determined;  Time spent for entire visit/ coordination of care 30 minutes  No follow-ups on file.  No orders of the defined types were placed in this encounter.   Requested Prescriptions    No prescriptions requested or ordered in this encounter

## 2020-03-09 NOTE — Telephone Encounter (Signed)
We can see she is scheduled to discuss her diabetes. She has an endocrinologist managing this for her- not Dr. Jenny Reichmann; they need to contact Dr. Loanne Drilling with questions and schedule a follow-up with Dr. Loanne Drilling. We will need to cancel this appointment today.

## 2020-03-09 NOTE — Discharge Summary (Addendum)
ELECTROPHYSIOLOGY PROCEDURE DISCHARGE SUMMARY    Patient ID: Gail Lynch,  MRN: [id number], DOB/AGE: 21-Feb-1941 79 y.o.  Admit date: 03/09/2020 Discharge date:  03/10/20   Primary Care Physician: Biagio Borg, MD  Primary Cardiologist: No primary care provider on file.  Electrophysiologist: New to Dr. Curt Bears  Primary Discharge Diagnosis:  Complete Heart Block status post pacemaker implantation this admission  Secondary Discharge Diagnosis:  DM2 ? Diastolic CHF Deconditioning Medical non-compliance  Allergies  Allergen Reactions   Aspirin Other (See Comments)    BLEEDING BEHIND EYES   Penicillin G Other (See Comments)    Unknown rxn DID THE REACTION INVOLVE: Swelling of the face/tongue/throat, SOB, or low BP? Unknown Sudden or severe rash/hives, skin peeling, or the inside of the mouth or nose? Unknown Did it require medical treatment? Unknown When did it last happen? Unknown If all above answers are "NO", may proceed with cephalosporin use.   Penicillins Other (See Comments)    Unknown rxn DID THE REACTION INVOLVE: Swelling of the face/tongue/throat, SOB, or low BP? Unknown Sudden or severe rash/hives, skin peeling, or the inside of the mouth or nose? Unknown Did it require medical treatment? Unknown When did it last happen? Unknown If all above answers are "NO", may proceed with cephalosporin use.     Procedures This Admission:  1.  Implantation of a Medtronic dual chamber PPM on 03/09/2020 by Dr. Curt Bears.  The patient received a Medtronic model number [id number] PPM with model number [id number] right atrial lead and 5076-58 right ventricular lead. There were no immediate post procedure complications. 2.  CXR on 03/10/20 demonstrated no pneumothorax status post device implantation.   Brief HPI: Gail Lynch is a 79 y.o. female was  presented to [facility] after being seen at PCP's office and noted to have CHB on EKG. EP was asked to see for consideration of PPM  implantation.  Past medical history includes above.  The patient has had symptomatic bradycardia without reversible causes identified.  Risks, benefits, and alternatives to PPM implantation were reviewed with the patient who wished to proceed.   Hospital Course:  The patient was admitted and underwent implantation of a Medtronic dual chamber PPM with details as outlined above.  She was monitored on telemetry overnight which demonstrated V pacing.  Left chest was without hematoma or ecchymosis.  The device was interrogated and found to be functioning normally.  CXR was obtained and demonstrated no pneumothorax status post device implantation.  Wound care, arm mobility, and restrictions were reviewed with the patient.  The patient was examined and considered stable for discharge to home with HHPT as below.     Counseled patient on the importance of managing her diabetes in the setting of wound healing. Would not resume Valsartan-HCTZ at this time with AKI and she has been off it for a month. Gail Lynch start on hydralazine 25 mg TID. Daughters and granddaughter also aware of the importance of PCP follow up.  Pt refused insulin here because it was "different than her insulin at home".  Pt is alert and oriented x 3+ (She knows year, where she is, that she got a pacemaker, month, president.. etc). PT evaluation recommends HHPT and someone to be at home with her.   Pt also with ? Component of diastolic CHF in setting of mild edema, but this also came on in setting of CHB.   ADDENDUM: Discussed with pts RN. Daughter called and explained plan. Her daughter Gail Lynch come get her today and stay  with her through Friday, then her Granddaughter Gail Lynch take over. HHPT has been arranged.   Physical Exam: Vitals:   03/10/20 0344 03/10/20 0508 03/10/20 0835 03/10/20 1402  BP: (!) 181/83 (!) 136/57 (!) 155/67 (!) 129/53  Pulse: 81  78 84  Resp: 19  20 20   Temp: 98.1 F (36.7 C)  (!) 97.5 F (36.4 C) 97.8 F (36.6 C)  TempSrc:  Oral  Oral Oral  SpO2: 98%  100% 100%  Weight: 87.1 kg     Height:        GEN- The patient is well appearing, alert and oriented x 3 today.   HEENT: normocephalic, atraumatic; sclera clear, conjunctiva pink; hearing intact; oropharynx clear; neck supple, no JVP Lymph- no cervical lymphadenopathy Lungs- Clear to ausculation bilaterally, normal work of breathing.  No wheezes, rales, rhonchi Heart- Regular rate and rhythm, no murmurs, rubs or gallops, PMI not laterally displaced GI- soft, non-tender, non-distended, bowel sounds present, no hepatosplenomegaly Extremities- no clubbing, cyanosis, or edema; DP/PT/radial pulses 2+ bilaterally MS- no significant deformity or atrophy Skin- warm and dry, no rash or lesion, left chest without hematoma/ecchymosis Psych- euthymic mood, full affect Neuro- strength and sensation are intact   Labs:   Lab Results  Component Value Date   WBC 9.4 03/10/2020   HGB 12.3 03/10/2020   HCT 39.0 03/10/2020   MCV 87.6 03/10/2020   PLT 200 03/10/2020    Recent Labs  Lab 03/10/20 0740  NA 140  K 4.4  CL 105  CO2 18*  BUN 29*  CREATININE 1.74*  CALCIUM 9.1  GLUCOSE 280*    Discharge Medications:  Allergies as of 03/10/2020       Reactions   Aspirin Other (See Comments)   BLEEDING BEHIND EYES   Penicillin G Other (See Comments)   Unknown rxn DID THE REACTION INVOLVE: Swelling of the face/tongue/throat, SOB, or low BP? Unknown Sudden or severe rash/hives, skin peeling, or the inside of the mouth or nose? Unknown Did it require medical treatment? Unknown When did it last happen? Unknown If all above answers are "NO", may proceed with cephalosporin use.   Penicillins Other (See Comments)   Unknown rxn DID THE REACTION INVOLVE: Swelling of the face/tongue/throat, SOB, or low BP? Unknown Sudden or severe rash/hives, skin peeling, or the inside of the mouth or nose? Unknown Did it require medical treatment? Unknown When did it last happen?  Unknown If all above answers are "NO", may proceed with cephalosporin use.        Medication List     STOP taking these medications    valsartan-hydrochlorothiazide 320-12.5 MG tablet Commonly known as: DIOVAN-HCT       TAKE these medications    acetaminophen 500 MG tablet Commonly known as: TYLENOL Take 1,000 mg by mouth every 6 (six) hours as needed for moderate pain.   alendronate 70 MG tablet Commonly known as: FOSAMAX Take 1 tablet (70 mg total) by mouth every 7 (seven) days. Take with a full glass of water on an empty stomach.   allopurinol 300 MG tablet Commonly known as: ZYLOPRIM Take 1 tablet (300 mg total) by mouth 2 (two) times daily. Must keep 11/15/16 appt for future refills   glucose blood test strip Commonly known as: ONE TOUCH ULTRA TEST 1 each by Other route 2 (two) times daily. And lancets 2/day 250.01   HumuLIN N KwikPen 100 UNIT/ML Kiwkpen Generic drug: Insulin NPH (Human) (Isophane) Inject 55 Units into the skin every morning.  hydrALAZINE 25 MG tablet Commonly known as: APRESOLINE Take 1 tablet (25 mg total) by mouth every 8 (eight) hours.   rosuvastatin 40 MG tablet Commonly known as: Crestor Take 1 tablet (40 mg total) by mouth daily.               Durable Medical Equipment  (From admission, onward)           Start     Ordered   03/10/20 1337  For home use only DME Walker rolling  Once       Question Answer Comment  Walker: With [employer] Wheels   Patient needs a walker to treat with the following condition Weakness      03/10/20 1337   03/10/20 1322  For home use only DME 3 n 1  Once        03/10/20 1321            Disposition:     Follow-up Information     Constance Haw, MD Follow up on 06/15/2020.   Specialty: Cardiology Why: at 3 pm for 3 month post pacemaker check Contact information: [address] [place] [address] [place] [address] [place] Follow up on 03/23/2020.   Why: at 1230 pm for post pacemaker wound check Contact information: [place] [address] [phone]        Biagio Borg, MD. Schedule an appointment as soon as possible for a visit.   Specialties: Internal Medicine, Radiology Why: as soon as possible Contact information: [place] [address] [facility] Follow up.   Why: rolling walker, 3 n 1 Contact information: [place] [address] [phone]         Care, [facility] Follow up.   Why: HHPT Contact information: [place] [address] [phone]                Duration of Discharge Encounter: Greater than 30 minutes including physician time.  Signed, Shirley Friar, PA-C  03/10/2020 2:14 PM  I have seen and examined this patient with Oda Kilts.  Agree with above, note added to reflect my findings.  On exam, RRR, no murmurs, lungs clear.  She is now status post Medtronic dual chamber pacemaker for complete AV block.  Device functioning appropriately.  Chest x-ray and interrogation without issue.  Plan for discharge today with follow-up in device clinic.  Khamila Bassinger M. Dovey Fatzinger MD 03/10/2020 7:40 PM

## 2020-03-09 NOTE — ED Provider Notes (Signed)
[facility] EMERGENCY DEPARTMENT Provider Note   CSN: [id number] Arrival date & time: 03/09/20  1345     History Chief Complaint  Patient presents with  . Bradycardia    Gail Lynch is a 79 y.o. female.  HPI   Patient presents to the emergency room for evaluation of bradycardia.  Patient states over the last couple weeks she has been feeling weak and fatigued.  She also has noticed some lower extremities swelling.  She did have some complaints of abdominal discomfort about a week ago but has not had any trouble since then.  She has not had any trouble with any chest pain or shortness of breath.  Patient has not had any issues with nausea or vomiting recently.  She went to see a primary care doctor today.  In the office they noted she was very bradycardic.  She was sent to the ED for evaluation.  Patient denies having any syncopal events.  Past Medical History:  Diagnosis Date  . Arthritis    gout in feet  . Asthma    hx as a child - no inhaler, no problems  . Carotid stenosis 06/18/2012  . Colon polyps   . Diabetes mellitus    Type 2  . Dysplasia of cervix, low grade (CIN 1)    s/p cervical conization  . Glaucoma   . Glaucoma 12/18/2011  . Hard of hearing    hear aids - left  . HPV (human papilloma virus) infection   . Hyperlipidemia   . Hypertension   . Intrinsic asthma, unspecified 12/18/2011  . Mild aortic stenosis 03/14/2017  . Mycotic toenails 02/19/2013  . PONV (postoperative nausea and vomiting)   . SVD (spontaneous vaginal delivery)    x 4    Patient Active Problem List   Diagnosis Date Noted  . Mild aortic stenosis 03/14/2017  . Peripheral edema 03/14/2017  . Arm heaviness 03/14/2017  . CKD (chronic kidney disease), stage III 05/20/2016  . Diabetes ([place]) 01/03/2016  . Dysuria 12/09/2014  . Peripheral neuropathy 12/05/2013  . Irregular heart beat 12/05/2013  . PVC (premature ventricular contraction) 12/05/2013  . Sciatica 06/17/2013  .  Pain due to onychomycosis of toenail 05/21/2013  . Mycotic toenails   . Vagina bleeding 08/06/2012  . Carotid stenosis 06/18/2012  . Constipation 04/30/2012  . Childhood asthma 12/18/2011  . Glaucoma 12/18/2011  . Hyperlipidemia 12/18/2011  . Colon polyps 12/18/2011  . Preventative health care 12/17/2011  . Hypertension 12/17/2011  . Hard of hearing   . Dysplasia of cervix, low grade (CIN 1)   . HPV (human papilloma virus) infection   . Brain mass 09/18/2011    Past Surgical History:  Procedure Laterality Date  . BREAST BIOPSY  2010  . BREAST BIOPSY  1991  . COLONOSCOPY    . DILATION AND CURETTAGE OF UTERUS  2000  . HYSTEROSCOPY WITH D & C N/A 03/04/2014   Procedure: DILATATION AND CURETTAGE /HYSTEROSCOPY;  Surgeon: Lavonia Drafts, MD;  Location: [facility];  Service: Gynecology;  Laterality: N/A;  . KNEE SURGERY  2010   patella fracture  . MULTIPLE TOOTH EXTRACTIONS    . POLYPECTOMY       OB History    Gravida  3   Para  3   Term  3   Preterm  0   AB  0   Living  3     SAB  0   TAB  0   Ectopic  0  Multiple  0   Live Births              Family History  Problem Relation Age of Onset  . Diabetes Brother   . Heart disease Brother   . Heart disease Mother   . Heart disease Father   . Asthma Other   . Colon cancer Neg Hx   . Rectal cancer Neg Hx   . Stomach cancer Neg Hx   . Esophageal cancer Neg Hx     Social History   Tobacco Use  . Smoking status: Former Smoker    Types: Cigarettes    Quit date: 08/21/1980    Years since quitting: 39.5  . Smokeless tobacco: Former Network engineer Use Topics  . Alcohol use: No    Alcohol/week: 0.0 standard drinks  . Drug use: No    Home Medications Prior to Admission medications   Medication Sig Start Date End Date Taking? Authorizing Provider  acetaminophen (TYLENOL) 500 MG tablet Take 1,000 mg by mouth every 6 (six) hours as needed for moderate pain.  Patient not taking: Reported on  03/09/2020    [provider]  alendronate (FOSAMAX) 70 MG tablet Take 1 tablet (70 mg total) by mouth every 7 (seven) days. Take with a full glass of water on an empty stomach. Patient not taking: Reported on 03/09/2020 06/15/18   Biagio Borg, MD  allopurinol (ZYLOPRIM) 300 MG tablet Take 1 tablet (300 mg total) by mouth 2 (two) times daily. Must keep 11/15/16 appt for future refills Patient not taking: Reported on 03/09/2020 11/15/16   Biagio Borg, MD  glucose blood (ONE TOUCH ULTRA TEST) test strip 1 each by Other route 2 (two) times daily. And lancets 2/day 250.01 07/09/13   Renato Shin, MD  Insulin NPH, Human,, Isophane, (HUMULIN N KWIKPEN) 100 UNIT/ML Kiwkpen Inject 55 Units into the skin every morning. Patient not taking: Reported on 03/09/2020 06/18/19   Renato Shin, MD  rosuvastatin (CRESTOR) 40 MG tablet Take 1 tablet (40 mg total) by mouth daily. Patient not taking: Reported on 03/09/2020 06/04/18   Biagio Borg, MD  valsartan-hydrochlorothiazide (DIOVAN-HCT) 320-12.5 MG tablet Take 1 tablet by mouth once daily Patient not taking: Reported on 03/09/2020 05/20/19   Biagio Borg, MD    Allergies    Aspirin, Penicillin g, and Penicillins  Review of Systems   Review of Systems  All other systems reviewed and are negative.   Physical Exam Updated Vital Signs BP (!) 216/46   Pulse (!) 31   Temp 97.8 F (36.6 C) (Temporal)   Resp 14   SpO2 99%   Physical Exam Vitals and nursing note reviewed.  Constitutional:      Appearance: She is well-developed. She is not diaphoretic.  HENT:     Head: Normocephalic and atraumatic.     Right Ear: External ear normal.     Left Ear: External ear normal.  Eyes:     General: No scleral icterus.       Right eye: No discharge.        Left eye: No discharge.     Conjunctiva/sclera: Conjunctivae normal.  Neck:     Trachea: No tracheal deviation.  Cardiovascular:     Rate and Rhythm: Regular rhythm. Bradycardia present.    Pulmonary:     Effort: Pulmonary effort is normal. No respiratory distress.     Breath sounds: Normal breath sounds. No stridor. No wheezing or rales.  Abdominal:  General: Bowel sounds are normal. There is no distension.     Palpations: Abdomen is soft.     Tenderness: There is no abdominal tenderness. There is no guarding or rebound.  Musculoskeletal:        General: No tenderness.     Cervical back: Neck supple.     Right lower leg: Edema present.     Left lower leg: Edema present.  Skin:    General: Skin is warm and dry.     Findings: No rash.  Neurological:     Mental Status: She is alert.     Cranial Nerves: No cranial nerve deficit (no facial droop, extraocular movements intact, no slurred speech).     Sensory: No sensory deficit.     Motor: No abnormal muscle tone or seizure activity.     Coordination: Coordination normal.     ED Results / Procedures / Treatments   Labs (all labs ordered are listed, but only abnormal results are displayed) Labs Reviewed  CBC WITH DIFFERENTIAL/PLATELET - Abnormal; Notable for the following components:      Result Value   Hemoglobin 11.9 (*)    All other components within normal limits  SARS CORONAVIRUS 2 BY RT PCR (HOSPITAL ORDER, [facility])  BASIC METABOLIC PANEL  TROPONIN I (HIGH SENSITIVITY)    EKG EKG Interpretation  Date/Time:  Tuesday March 09 2020 13:50:14 EDT Ventricular Rate:  32 PR Interval:    QRS Duration: 99 QT Interval:  557 QTC Calculation: 407 R Axis:   -24 Text Interpretation: AV block, complete (third degree) Anterior infarct, old heart block new since last tracing Confirmed by Dorie Rank [phone]) on 03/09/2020 2:31:52 PM   Radiology No results found.  Procedures .Critical Care Performed by: Dorie Rank, MD Authorized by: Dorie Rank, MD   Critical care provider statement:    Critical care time (minutes):  30   Critical care was time spent personally by me on the  following activities:  Discussions with consultants, evaluation of patient's response to treatment, examination of patient, ordering and performing treatments and interventions, ordering and review of laboratory studies, ordering and review of radiographic studies, pulse oximetry, re-evaluation of patient's condition, obtaining history from patient or surrogate and review of old charts   (including critical care time)  Medications Ordered in ED Medications - No data to display  ED Course  I have reviewed the triage vital signs and the nursing notes.  Pertinent labs & imaging results that were available during my care of the patient were reviewed by me and considered in my medical decision making (see chart for details).  Clinical Course as of Mar 09 1529  Tue Mar 09, 2020  1418 Patient's EKG suggest complete heart block.  Patient appears comfortable fortunately at the bedside.  She is not hypotensive, or diaphoretic.  Labs have been ordered.  Will have pacer pads applied as a precaution.  I will consult with cardiology.   [PZ]  0258 Discussed with Trish.  Cardiology will evaluate in the ED   [JK]    Clinical Course User Index [JK] Dorie Rank, MD   MDM Rules/Calculators/A&P                          Patient presents to ED with complaints of generalized fatigue weakness.  Noted to be bradycardic at the doctor's office.  Patient's EKG is concerning for complete heart block.  She fortunately has remained hemodynamically  stable and is in fact hypertensive.  CBC is unremarkable.  Metabolic panel is still pending.  I have consulted with cardiology will come down and evaluate the patient in the ED.  She has been placed on pacer pads as a precaution but no indication for pacing or acute intervention at this time.  Plan admission to the hospital for further evaluation.  Dr. Sherry Ruffing will review her metabolic panel and follow-up on cardiology recommendations.  Final Clinical Impression(s) / ED  Diagnoses Final diagnoses:  Heart block AV complete ([place])      Dorie Rank, MD 03/09/20 1531

## 2020-03-09 NOTE — Consult Note (Addendum)
ELECTROPHYSIOLOGY CONSULT NOTE    Patient ID: Gail Lynch MRN: [id number], DOB/AGE: 1940-09-22 79 y.o.  Admit date: 03/09/2020 Date of Consult: 03/09/2020  Primary Physician: Biagio Borg, MD Primary Cardiologist: No primary care provider on file.  Electrophysiologist:  New to Dr. Curt Bears    Referring Provider:  Emergency Department  Patient Profile: Gail Lynch is a 79 y.o. female with a history of HTN, poorly controlled DM2, and HLD and who is being seen today for the evaluation of Complete Heart Block at the request of Dr. Tomi Bamberger.  HPI:  Gail Lynch is a 79 y.o. female with medical history as above. She states she has been off her medications for approx 1 month, because she "didn't want to be taking insulin anymore." She has been taking vitamins.  She presented to her PCP today to discuss her diabetes. She was found to have HR in the 30s and EMS was called. She has been hemodynamically stable.     At baseline, she lives at home alone and does all of her ADLs without difficulty.  She has noticed more fatigue and weakness, along with peripheral edema for the past 2 weeks.   Pertinent labs on admission include WBC 6.3, Hgb 11.9, K 4.3, Cr 2.04 (likely elevated in setting of CHB, but poor follow up).  Mild HS-troponin at 35. COVID result pending.   Past Medical History:  Diagnosis Date   Arthritis    gout in feet   Asthma    hx as a child - no inhaler, no problems   Carotid stenosis 06/18/2012   Colon polyps    Diabetes mellitus    Type 2   Dysplasia of cervix, low grade (CIN 1)    s/p cervical conization   Glaucoma    Glaucoma 12/18/2011   Hard of hearing    hear aids - left   HPV (human papilloma virus) infection    Hyperlipidemia    Hypertension    Intrinsic asthma, unspecified 12/18/2011   Mild aortic stenosis 03/14/2017   Mycotic toenails 02/19/2013   PONV (postoperative nausea and vomiting)    SVD (spontaneous vaginal delivery)    x 4     Surgical History:    Past Surgical History:  Procedure Laterality Date   BREAST BIOPSY  2010   BREAST BIOPSY  1991   COLONOSCOPY     DILATION AND CURETTAGE OF UTERUS  2000   HYSTEROSCOPY WITH D & C N/A 03/04/2014   Procedure: DILATATION AND CURETTAGE /HYSTEROSCOPY;  Surgeon: Lavonia Drafts, MD;  Location: [facility];  Service: Gynecology;  Laterality: N/A;   KNEE SURGERY  2010   patella fracture   MULTIPLE TOOTH EXTRACTIONS     POLYPECTOMY       (Not in a hospital admission)   Inpatient Medications:   Allergies:  Allergies  Allergen Reactions   Aspirin Other (See Comments)    BLEEDING BEHIND EYES   Penicillin G Other (See Comments)    Unknown rxn DID THE REACTION INVOLVE: Swelling of the face/tongue/throat, SOB, or low BP? Unknown Sudden or severe rash/hives, skin peeling, or the inside of the mouth or nose? Unknown Did it require medical treatment? Unknown When did it last happen? Unknown If all above answers are "NO", may proceed with cephalosporin use.   Penicillins Other (See Comments)    Unknown rxn DID THE REACTION INVOLVE: Swelling of the face/tongue/throat, SOB, or low BP? Unknown Sudden or severe rash/hives, skin peeling, or the inside of the mouth or nose?  Unknown Did it require medical treatment? Unknown When did it last happen? Unknown If all above answers are "NO", may proceed with cephalosporin use.    Social History   Socioeconomic History   Marital status: Widowed    Spouse name: Not on file   Number of children: 3   Years of education: Not on file   Highest education level: Not on file  Occupational History   Occupation: retired  Tobacco Use   Smoking status: Former Smoker    Types: Cigarettes    Quit date: 08/21/1980    Years since quitting: 39.5   Smokeless tobacco: Former Network engineer and Sexual Activity   Alcohol use: No    Alcohol/week: 0.0 standard drinks   Drug use: No   Sexual activity: Yes    Birth control/protection: Post-menopausal  Other  Topics Concern   Not on file  Social History Narrative   Not on file   Social Determinants of Health   Financial Resource Strain:    Difficulty of Paying Living Expenses:   Food Insecurity:    Worried About Charity fundraiser in the Last Year:    Arboriculturist in the Last Year:   Transportation Needs:    Film/video editor (Medical):    Lack of Transportation (Non-Medical):   Physical Activity:    Days of Exercise per Week:    Minutes of Exercise per Session:   Stress:    Feeling of Stress :   Social Connections:    Frequency of Communication with Friends and Family:    Frequency of Social Gatherings with Friends and Family:    Attends Religious Services:    Active Member of Clubs or Organizations:    Attends Music therapist:    Marital Status:   Intimate Partner Violence:    Fear of Current or Ex-Partner:    Emotionally Abused:    Physically Abused:    Sexually Abused:      Family History  Problem Relation Age of Onset   Diabetes Brother    Heart disease Brother    Heart disease Mother    Heart disease Father    Asthma Other    Colon cancer Neg Hx    Rectal cancer Neg Hx    Stomach cancer Neg Hx    Esophageal cancer Neg Hx      Review of Systems: All other systems reviewed and are otherwise negative except as noted above.  Physical Exam: Vitals:   03/09/20 1354 03/09/20 1440 03/09/20 1559  BP: (!) 238/54 (!) 216/46   Pulse: (!) 34 (!) 31 (!) 30  Resp: 14 14 16   Temp: 97.8 F (36.6 C)    TempSrc: Temporal    SpO2: 99% 99% 99%    GEN- The patient is elderly and somewhat hard of hearing appearing, alert and oriented x 3 today.   HEENT: normocephalic, atraumatic; sclera clear, conjunctiva pink; hearing intact; oropharynx clear; neck supple Lungs- Clear to ausculation bilaterally, normal work of breathing.  No wheezes, rales, rhonchi Heart- Slow, but regular rate and rhythm, no murmurs, rubs or gallops GI- soft, non-tender,  non-distended, bowel sounds present Extremities- no clubbing, cyanosis, or edema; DP/PT/radial pulses 2+ bilaterally MS- no significant deformity or atrophy Skin- warm and dry, no rash or lesion Psych- euthymic mood, full affect Neuro- strength and sensation are intact  Labs:   Lab Results  Component Value Date   WBC 6.3 03/09/2020   HGB 11.9 (L) 03/09/2020  HCT 39.1 03/09/2020   MCV 90.5 03/09/2020   PLT 186 03/09/2020    Recent Labs  Lab 03/09/20 1413  NA 141  K 4.3  CL 104  CO2 20*  BUN 29*  CREATININE 2.04*  CALCIUM 9.2  GLUCOSE 143*      Radiology/Studies: No results found.  EKG: CHB with narrow escape at 32 bpm (QRS 99 ms) (personally reviewed)  TELEMETRY: CHB 40s (personally reviewed)  Assessment/Plan: 1.  CHB New and without reversible cause.  No AV nodal blockers.  With mild AS mentioned on previous echo, ? Amyloid.  Electrolytes WNL. Mild AKI in setting of CHB.  Explained risks, benefits, and alternatives to PPM implantation, including but not limited to bleeding, infection, pneumothorax, pericardial effusion, lead dislodgement, heart attack, stroke, or death.  Pt verbalized understanding and agrees to proceed if indicated. Pts granddaughter is present and wants to discuss with her mother and aunt (patients daughters) prior to proceeding, but says we are OK to prepare.   2. DM2 Stressed importance of controlling her DM in the setting of wound healing re: PPM.  She Dariann Huckaba need close PCP follow up, and Yoshito Gaza consider having diabetes coordinator see based on her labwork and sugars.   3. HTN Lino Wickliff have more options for pressure control s/p PPM.  Pressures 230s initially. Now in 190s.  For questions or updates, please contact [place] Please consult [email or web address] for contact info under Cardiology/STEMI.  Signed, Shirley Friar, PA-C  03/09/2020 4:18 PM   I have seen and examined this patient with Oda Kilts.  Agree with above, note added  to reflect my findings.  On exam, regular, bradycardic, no murmurs. Patient with complete AV block found at PCP office. No reversible causes. Benaiah Behan plan for pacemaker implant today.    Lonney Revak M. Eesa Justiss MD 03/09/2020 4:34 PM

## 2020-03-10 ENCOUNTER — Ambulatory Visit (HOSPITAL_COMMUNITY): Payer: Medicare Other

## 2020-03-10 ENCOUNTER — Encounter (HOSPITAL_COMMUNITY): Payer: Self-pay | Admitting: Cardiology

## 2020-03-10 DIAGNOSIS — Z20822 Contact with and (suspected) exposure to covid-19: Secondary | ICD-10-CM | POA: Diagnosis not present

## 2020-03-10 DIAGNOSIS — Z9119 Patient's noncompliance with other medical treatment and regimen: Secondary | ICD-10-CM | POA: Diagnosis not present

## 2020-03-10 DIAGNOSIS — I5030 Unspecified diastolic (congestive) heart failure: Secondary | ICD-10-CM | POA: Diagnosis not present

## 2020-03-10 DIAGNOSIS — I442 Atrioventricular block, complete: Secondary | ICD-10-CM | POA: Diagnosis not present

## 2020-03-10 DIAGNOSIS — R29898 Other symptoms and signs involving the musculoskeletal system: Secondary | ICD-10-CM | POA: Diagnosis not present

## 2020-03-10 DIAGNOSIS — J9811 Atelectasis: Secondary | ICD-10-CM | POA: Diagnosis not present

## 2020-03-10 DIAGNOSIS — I350 Nonrheumatic aortic (valve) stenosis: Secondary | ICD-10-CM | POA: Diagnosis not present

## 2020-03-10 DIAGNOSIS — R609 Edema, unspecified: Secondary | ICD-10-CM | POA: Diagnosis not present

## 2020-03-10 DIAGNOSIS — E119 Type 2 diabetes mellitus without complications: Secondary | ICD-10-CM | POA: Diagnosis not present

## 2020-03-10 LAB — CBC WITH DIFFERENTIAL/PLATELET
Abs Immature Granulocytes: 0.03 10*3/uL (ref 0.00–0.07)
Basophils Absolute: 0 10*3/uL (ref 0.0–0.1)
Basophils Relative: 0 %
Eosinophils Absolute: 0 10*3/uL (ref 0.0–0.5)
Eosinophils Relative: 0 %
HCT: 39 % (ref 36.0–46.0)
Hemoglobin: 12.3 g/dL (ref 12.0–15.0)
Immature Granulocytes: 0 %
Lymphocytes Relative: 5 %
Lymphs Abs: 0.5 10*3/uL — ABNORMAL LOW (ref 0.7–4.0)
MCH: 27.6 pg (ref 26.0–34.0)
MCHC: 31.5 g/dL (ref 30.0–36.0)
MCV: 87.6 fL (ref 80.0–100.0)
Monocytes Absolute: 0.3 10*3/uL (ref 0.1–1.0)
Monocytes Relative: 3 %
Neutro Abs: 8.6 10*3/uL — ABNORMAL HIGH (ref 1.7–7.7)
Neutrophils Relative %: 92 %
Platelets: 200 10*3/uL (ref 150–400)
RBC: 4.45 MIL/uL (ref 3.87–5.11)
RDW: 13.9 % (ref 11.5–15.5)
WBC: 9.4 10*3/uL (ref 4.0–10.5)
nRBC: 0 % (ref 0.0–0.2)

## 2020-03-10 LAB — BASIC METABOLIC PANEL
Anion gap: 17 — ABNORMAL HIGH (ref 5–15)
BUN: 29 mg/dL — ABNORMAL HIGH (ref 8–23)
CO2: 18 mmol/L — ABNORMAL LOW (ref 22–32)
Calcium: 9.1 mg/dL (ref 8.9–10.3)
Chloride: 105 mmol/L (ref 98–111)
Creatinine, Ser: 1.74 mg/dL — ABNORMAL HIGH (ref 0.44–1.00)
GFR calc Af Amer: 31 mL/min — ABNORMAL LOW (ref >60)
GFR calc non Af Amer: 27 mL/min — ABNORMAL LOW (ref >60)
Glucose, Bld: 280 mg/dL — ABNORMAL HIGH (ref 70–99)
Potassium: 4.4 mmol/L (ref 3.5–5.1)
Sodium: 140 mmol/L (ref 135–145)

## 2020-03-10 LAB — HEMOGLOBIN A1C
Hgb A1c MFr Bld: 8.1 % — ABNORMAL HIGH (ref 4.8–5.6)
Mean Plasma Glucose: 185.77 mg/dL

## 2020-03-10 LAB — GLUCOSE, CAPILLARY
Glucose-Capillary: 258 mg/dL — ABNORMAL HIGH (ref 70–99)
Glucose-Capillary: 177 mg/dL — ABNORMAL HIGH (ref 70–99)

## 2020-03-10 MED ORDER — HYDRALAZINE HCL 25 MG PO TABS: 25.0000 mg | ORAL_TABLET | Freq: Three times a day (TID) | ORAL | 3 refills | Status: DC

## 2020-03-10 MED ORDER — HYDRALAZINE HCL 25 MG PO TABS
25.0000 mg | ORAL_TABLET | Freq: Three times a day (TID) | ORAL | Status: DC
  Administered 2020-03-10: 25 mg via ORAL
  Filled 2020-03-10: qty 1

## 2020-03-10 MED ORDER — SODIUM CHLORIDE 0.9 % IV SOLN
INTRAVENOUS | Status: DC | PRN
  Administered 2020-03-10: 250 mL via INTRAVENOUS

## 2020-03-10 NOTE — TOC Transition Note (Addendum)
Transition of Care [facility]) - CM/SW Discharge Note   Patient Details  Name: Gail Lynch MRN: [id number] Date of Birth: [birth year]  Transition of Care [facility]) CM/SW Contact:  Zenon Mayo, RN Phone Number: 03/10/2020, 1:54 PM   Clinical Narrative:    Patient is for dc today, NCM offered choice for HHPT, she states [facility] will be ok.  NCM made referral to [facility] she is able to take referral.  Soc will begin in 4 to 5 days.  She wants the walker but does not want the 3 n 1.  Final next level of care: [place] Barriers to Discharge: No Barriers Identified   Patient Goals and CMS Choice Patient states their goals for this hospitalization and ongoing recovery are:: get better CMS Medicare.gov Compare Post Acute Care list provided to:: Patient Choice offered to / list presented to : Patient  Discharge Placement                       Discharge Plan and Services                DME Arranged: Walker rolling DME Agency: [employer] Date DME Agency Contacted: 03/10/20 Time DME Agency Contacted: [id number] Representative spoke with at DME Agency: [place]: PT [place]: [place] Date [place]: 03/10/20 Time [place]: [place] Representative spoke with at [place]: [place] Determinants of Health ([place]) Interventions     Readmission Risk Interventions No flowsheet data found.

## 2020-03-10 NOTE — Plan of Care (Signed)

## 2020-03-10 NOTE — Progress Notes (Signed)
D/C instructions given and reviewed. IV and tele removed. Tolerated well.

## 2020-03-10 NOTE — Progress Notes (Signed)
  Discussed progress so far today with pts daughter.  She thinks they can get together to have (mostly) 24/7 care for her mom, but does state that her mom will be reluctant to having someone around that often.  She is going to discuss with her children and siblings and call back in about an hour to let us know if that can be done.   She is grateful that she will be getting HHPT.   Legrand Como [address] [place], PA-C  03/10/2020 1:24 PM

## 2020-03-10 NOTE — Progress Notes (Signed)
Pt seen for PT and demonstrated cognitive deficits and occasional unsteadiness. Recommending HHPT and 24/7 assist initially at d/c. Pt has RW at home. Educated about using RW at d/c. Also recommending 3 in 1 for DME. Pt reports daughter can assist at home. Will need to confirm. Formal note to follow.   Reuel Derby, PT, DPT  Acute Rehabilitation Services  Pager: [phone] Office: [phone]

## 2020-03-10 NOTE — Progress Notes (Signed)
Patient refused her insulin for morning coverage.

## 2020-03-10 NOTE — Evaluation (Signed)
Physical Therapy Evaluation Patient Details Name: Gail Lynch MRN: [id number] DOB: 09/24/1940 Today's Date: 03/10/2020   History of Present Illness   79 y.o. female s/p L-sided pacemaker surgery. She has a history of HTN, poorly controlled DM2, and HLD and who is being seen today for the evaluation of Complete Heart Block. She was found to have HR in the 30s and EMS was called. She has been hemodynamically stable.  Clinical Impression  Pt is 79 yo female that was assessed today with the above diagnoses and the impairments listed below. Pt had difficulty with following simple one step commands and slowly processed information. Pt was supervision and min guard for bed mobility and transfers. Pt was min guard to min assist for ambulation and stair navigation. Pt with x 1 LOB in gait. Noticeable fatigue at end of session. Pt is an appropriate candidate for home health PT and would need 24/7 assistance in the home due to unsteadiness with gait and balance with functional mobility. Pt states that family and friends are available for her to call for assistance. Will continue to follow acutely.     Follow Up Recommendations Home health PT;Supervision/Assistance - 24 hour    Equipment Recommendations  3in1 (PT)    Recommendations for Other Services       Precautions / Restrictions Precautions Precautions: ICD/Pacemaker Precaution Comments: pacemaker precautions Restrictions Weight Bearing Restrictions: Yes (pacemaker precautions)      Mobility  Bed Mobility Overal bed mobility: Needs Assistance Bed Mobility: Supine to Sit     Supine to sit: Supervision     General bed mobility comments: pt required multiple cues to get to EOB unsure if due to difficulty hearing or cognitive deficits.   Transfers Overall transfer level: Needs assistance Equipment used: None Transfers: Sit to/from Stand Sit to Stand: Min guard         General transfer comment: min guard for  safety  Ambulation/Gait Ambulation/Gait assistance: Min guard;Min assist Gait Distance (Feet): 225 Feet Assistive device: None Gait Pattern/deviations: Decreased step length - right;Decreased step length - left;Step-through pattern;Staggering right Gait velocity: decreased   General Gait Details: pt had increased sway and unsteadiness with gait as session progressed, pt had LOB with weight shift to the right required min assist  Stairs Stairs: Yes Stairs assistance: Min guard Stair Management: One rail Right;Step to pattern;Forwards Number of Stairs: 3 General stair comments:    Wheelchair Mobility    Modified Rankin (Stroke Patients Only)       Balance Overall balance assessment: Needs assistance Sitting-balance support: Feet supported Sitting balance-Leahy Scale: Fair     Standing balance support: No upper extremity supported;During functional activity Standing balance-Leahy Scale: Fair                               Pertinent Vitals/Pain Pain Assessment: No/denies pain    Home Living Family/patient expects to be discharged to:: Private residence Living Arrangements: Alone Available Help at Discharge: Family;Friend(s) Type of Home: Apartment Home Access: Stairs to enter Entrance Stairs-Rails: Left Entrance Stairs-Number of Steps: 3 Home Layout: One level Home Equipment: Walker - 2 wheels;Cane - single point      Prior Function Level of Independence: Independent               Hand Dominance   Dominant Hand: Right    Extremity/Trunk Assessment   Upper Extremity Assessment Upper Extremity Assessment: Defer to OT evaluation    Lower Extremity  Assessment Lower Extremity Assessment: Generalized weakness    Cervical / Trunk Assessment Cervical / Trunk Assessment: Normal  Communication   Communication: HOH  Cognition Arousal/Alertness: Awake/alert Behavior During Therapy: Flat affect Overall Cognitive Status: Impaired/Different from  baseline Area of Impairment: Problem solving;Following commands                       Following Commands: Follows one step commands inconsistently;Follows one step commands with increased time     Problem Solving: Slow processing;Requires verbal cues General Comments: pt hard of hearing; however, demonstrated problem solving and processing difficulties      General Comments      Exercises     Assessment/Plan    PT Assessment Patient needs continued PT services  PT Problem List Decreased strength;Decreased range of motion;Decreased activity tolerance;Decreased balance;Decreased mobility;Decreased coordination;Decreased safety awareness;Decreased knowledge of use of DME;Decreased knowledge of precautions;Decreased cognition       PT Treatment Interventions DME instruction;Gait training;Stair training;Functional mobility training;Therapeutic activities;Therapeutic exercise;Balance training;Neuromuscular re-education;Patient/family education    PT Goals (Current goals can be found in the Care Plan section)  Acute Rehab PT Goals Patient Stated Goal: return home PT Goal Formulation: With patient Time For Goal Achievement: 03/24/20 Potential to Achieve Goals: Good    Frequency Min 3X/week   Barriers to discharge        Co-evaluation               AM-PAC PT "6 Clicks" Mobility  Outcome Measure Help needed turning from your back to your side while in a flat bed without using bedrails?: None Help needed moving from lying on your back to sitting on the side of a flat bed without using bedrails?: None Help needed moving to and from a bed to a chair (including a wheelchair)?: A Little Help needed standing up from a chair using your arms (e.g., wheelchair or bedside chair)?: A Little Help needed to walk in hospital room?: A Little Help needed climbing 3-5 steps with a railing? : A Little 6 Click Score: 20    End of Session Equipment Utilized During Treatment: Gait  belt Activity Tolerance: Patient tolerated treatment well Patient left: in bed;with call Tayveon Lombardo/phone within reach;with bed alarm set (sitting EOB ) Nurse Communication: Mobility status PT Visit Diagnosis: Unsteadiness on feet (R26.81);Muscle weakness (generalized) (M62.81);Difficulty in walking, not elsewhere classified (R26.2)    Time: 1123-1150 PT Time Calculation (min) (ACUTE ONLY): 27 min   Charges:   PT Evaluation $PT Eval Moderate Complexity: 1 Mod PT Treatments $Gait Training: 8-22 mins       Gloriann Loan, SPT  Acute Rehabilitation Services  Office: [phone]  03/10/2020, 2:19 PM

## 2020-03-10 NOTE — Addendum Note (Signed)
Addended by: Marcina Millard on: 03/10/2020 08:16 AM   Modules accepted: Orders

## 2020-03-11 ENCOUNTER — Telehealth: Payer: Self-pay

## 2020-03-11 NOTE — Telephone Encounter (Signed)
Remote transmission from PM received-  Numerous AT/ AF episodes throughout the day 03/10/20 controlled paced ventricular rate. Pt was discharged from hospital that afternoon.  No documented history of AF, Pt is not on [facility]

## 2020-03-11 NOTE — Telephone Encounter (Signed)
Per Dr. Curt Bears: continue to follow HRs for now. Will forward back  to device clinic

## 2020-03-18 ENCOUNTER — Other Ambulatory Visit: Payer: Self-pay

## 2020-03-18 ENCOUNTER — Encounter: Payer: Self-pay | Admitting: Internal Medicine

## 2020-03-18 ENCOUNTER — Ambulatory Visit (INDEPENDENT_AMBULATORY_CARE_PROVIDER_SITE_OTHER): Payer: Medicare Other | Admitting: Internal Medicine

## 2020-03-18 VITALS — BP 130/80 | HR 87 | Temp 99.4°F | Ht 63.0 in | Wt 190.0 lb

## 2020-03-18 DIAGNOSIS — Z0000 Encounter for general adult medical examination without abnormal findings: Secondary | ICD-10-CM | POA: Diagnosis not present

## 2020-03-18 DIAGNOSIS — E11319 Type 2 diabetes mellitus with unspecified diabetic retinopathy without macular edema: Secondary | ICD-10-CM | POA: Diagnosis not present

## 2020-03-18 DIAGNOSIS — R609 Edema, unspecified: Secondary | ICD-10-CM | POA: Diagnosis not present

## 2020-03-18 DIAGNOSIS — Z794 Long term (current) use of insulin: Secondary | ICD-10-CM

## 2020-03-18 DIAGNOSIS — I442 Atrioventricular block, complete: Secondary | ICD-10-CM

## 2020-03-18 MED ORDER — HYDROCHLOROTHIAZIDE 12.5 MG PO CAPS: 12.5000 mg | ORAL_CAPSULE | Freq: Every day | ORAL | 3 refills | Status: DC

## 2020-03-18 MED ORDER — ROSUVASTATIN CALCIUM 40 MG PO TABS: 40.0000 mg | ORAL_TABLET | Freq: Every day | ORAL | 3 refills | Status: DC

## 2020-03-18 MED ORDER — HUMULIN N KWIKPEN 100 UNIT/ML ~~LOC~~ SUPN: 55.0000 [IU] | PEN_INJECTOR | SUBCUTANEOUS | 11 refills | Status: DC

## 2020-03-18 MED ORDER — HYDRALAZINE HCL 25 MG PO TABS: 25.0000 mg | ORAL_TABLET | Freq: Three times a day (TID) | ORAL | 3 refills | Status: DC

## 2020-03-18 MED ORDER — ALLOPURINOL 300 MG PO TABS: 300.0000 mg | ORAL_TABLET | Freq: Two times a day (BID) | ORAL | 3 refills | Status: DC

## 2020-03-18 NOTE — Assessment & Plan Note (Signed)
With mild worsening off the valsartan hct - ok to restart the hct 12.5

## 2020-03-18 NOTE — Patient Instructions (Signed)
Ok to restart the fluid pill at 12.5 mg per day  Please continue all other medications as before, and refills have been done if requested.  Please have the pharmacy call with any other refills you may need.  Please continue your efforts at being more active, low cholesterol diet, and weight control.  You are otherwise up to date with prevention measures today.  Please keep your appointments with your specialists as you may have planned - remember to continue to follow up with Dr Loanne Drilling for the sugar  No further lab work is needed today  Please make an Appointment to return in 6 months, or sooner if needed

## 2020-03-18 NOTE — Assessment & Plan Note (Signed)
Stable, to f/u card oct 2021

## 2020-03-18 NOTE — Progress Notes (Signed)
Subjective:    Patient ID: Gail Lynch, female    DOB: [birth year], 79 y.o.   MRN: [id number]  HPI  Here for wellness and f/u;  Overall doing ok;  Pt denies Chest pain, worsening SOB, DOE, wheezing, orthopnea, PND, palpitations, dizziness or syncope, though has had some mild increased ankle edema after valsartan hct stopped.  Pt denies neurological change such as new headache, facial or extremity weakness.  Pt denies polydipsia, polyuria, or low sugar symptoms. Pt states overall good compliance with treatment and medications, good tolerability, and has been trying to follow appropriate diet.  Pt denies worsening depressive symptoms, suicidal ideation or panic. No fever, night sweats, wt loss, loss of appetite, or other constitutional symptoms.  Pt states good ability with ADL's, has low fall risk, home safety reviewed and adequate, no other significant changes in hearing or vision, and only occasionally active with exercise. Past Medical History:  Diagnosis Date  . Arthritis    gout in feet  . Asthma    hx as a child - no inhaler, no problems  . Carotid stenosis 06/18/2012  . Colon polyps   . Diabetes mellitus    Type 2  . Dysplasia of cervix, low grade (CIN 1)    s/p cervical conization  . Glaucoma   . Glaucoma 12/18/2011  . Hard of hearing    hear aids - left  . HPV (human papilloma virus) infection   . Hyperlipidemia   . Hypertension   . Intrinsic asthma, unspecified 12/18/2011  . Mild aortic stenosis 03/14/2017  . Mycotic toenails 02/19/2013  . PONV (postoperative nausea and vomiting)   . SVD (spontaneous vaginal delivery)    x 4   Past Surgical History:  Procedure Laterality Date  . BREAST BIOPSY  2010  . BREAST BIOPSY  1991  . COLONOSCOPY    . DILATION AND CURETTAGE OF UTERUS  2000  . HYSTEROSCOPY WITH D & C N/A 03/04/2014   Procedure: DILATATION AND CURETTAGE /HYSTEROSCOPY;  Surgeon: Lavonia Drafts, MD;  Location: [facility];  Service: Gynecology;  Laterality: N/A;  .  KNEE SURGERY  2010   patella fracture  . MULTIPLE TOOTH EXTRACTIONS    . PACEMAKER IMPLANT N/A 03/09/2020   Procedure: PACEMAKER IMPLANT;  Surgeon: Constance Haw, MD;  Location: [facility];  Service: Cardiovascular;  Laterality: N/A;  . POLYPECTOMY      reports that she quit smoking about 39 years ago. Her smoking use included cigarettes. She has quit using smokeless tobacco. She reports that she does not drink alcohol and does not use drugs. family history includes Asthma in an other family member; Diabetes in her brother; Heart disease in her brother, father, and mother. Allergies  Allergen Reactions  . Aspirin Other (See Comments)    BLEEDING BEHIND EYES  . Penicillin G Other (See Comments)    Unknown rxn DID THE REACTION INVOLVE: Swelling of the face/tongue/throat, SOB, or low BP? Unknown Sudden or severe rash/hives, skin peeling, or the inside of the mouth or nose? Unknown Did it require medical treatment? Unknown When did it last happen?Unknown If all above answers are "NO", may proceed with cephalosporin use.  Marland Kitchen Penicillins Other (See Comments)    Unknown rxn DID THE REACTION INVOLVE: Swelling of the face/tongue/throat, SOB, or low BP? Unknown Sudden or severe rash/hives, skin peeling, or the inside of the mouth or nose? Unknown Did it require medical treatment? Unknown When did it last happen?Unknown If all above answers are "NO", may  proceed with cephalosporin use.   Current Outpatient Medications on File Prior to Visit  Medication Sig Dispense Refill  . glucose blood (ONE TOUCH ULTRA TEST) test strip 1 each by Other route 2 (two) times daily. And lancets 2/day 250.01 180 each 3   No current facility-administered medications on file prior to visit.   Review of Systems All otherwise neg per pt     Objective:   Physical Exam BP (!) 130/80 (BP Location: Left Arm, Patient Position: Sitting, Cuff Size: Large)   Pulse 87   Temp 99.4 F (37.4 C) (Oral)    Ht 5\' 3"  (1.6 m)   Wt 190 lb (86.2 kg)   SpO2 96%   BMI 33.66 kg/m  VS noted,  Constitutional: Pt appears in NAD HENT: Head: NCAT.  Right Ear: External ear normal.  Left Ear: External ear normal.  Eyes: . Pupils are equal, round, and reactive to light. Conjunctivae and EOM are normal Nose: without d/c or deformity Neck: Neck supple. Gross normal ROM Cardiovascular: Normal rate and regular rhythm.   Pulmonary/Chest: Effort normal and breath sounds without rales or wheezing.  Abd:  Soft, NT, ND, + BS, no organomegaly Neurological: Pt is alert. At baseline orientation, motor grossly intact Skin: Skin is warm. No rashes, other new lesions, no LE edema Psychiatric: Pt behavior is normal without agitation  All otherwise neg per pt Lab Results  Component Value Date   WBC 9.4 03/10/2020   HGB 12.3 03/10/2020   HCT 39.0 03/10/2020   PLT 200 03/10/2020   GLUCOSE 280 (H) 03/10/2020   CHOL 221 (H) 09/17/2018   TRIG 132.0 09/17/2018   HDL 34.70 (L) 09/17/2018   LDLDIRECT 152.9 12/18/2011   LDLCALC 160 (H) 09/17/2018   ALT 11 09/17/2018   AST 12 09/17/2018   NA 140 03/10/2020   K 4.4 03/10/2020   CL 105 03/10/2020   CREATININE 1.74 (H) 03/10/2020   BUN 29 (H) 03/10/2020   CO2 18 (L) 03/10/2020   TSH 2.14 09/17/2018   INR 1.1 (H) 09/08/2013   HGBA1C 8.1 (H) 03/10/2020   MICROALBUR 2.6 (H) 11/21/2017      Assessment & Plan:

## 2020-03-19 DIAGNOSIS — Z950 Presence of cardiac pacemaker: Secondary | ICD-10-CM | POA: Insufficient documentation

## 2020-03-21 ENCOUNTER — Encounter: Payer: Self-pay | Admitting: Internal Medicine

## 2020-03-21 NOTE — Assessment & Plan Note (Signed)
stable overall by history and exam, recent data reviewed with pt, and pt to continue medical treatment as before,  to f/u any worsening symptoms or concerns  

## 2020-03-21 NOTE — Assessment & Plan Note (Signed)

## 2020-03-23 ENCOUNTER — Ambulatory Visit: Payer: Medicare Other

## 2020-03-25 DIAGNOSIS — Z794 Long term (current) use of insulin: Secondary | ICD-10-CM | POA: Diagnosis not present

## 2020-03-25 DIAGNOSIS — R600 Localized edema: Secondary | ICD-10-CM | POA: Diagnosis not present

## 2020-03-25 DIAGNOSIS — I442 Atrioventricular block, complete: Secondary | ICD-10-CM | POA: Diagnosis not present

## 2020-03-25 DIAGNOSIS — E11311 Type 2 diabetes mellitus with unspecified diabetic retinopathy with macular edema: Secondary | ICD-10-CM | POA: Diagnosis not present

## 2020-03-25 DIAGNOSIS — Z950 Presence of cardiac pacemaker: Secondary | ICD-10-CM | POA: Diagnosis not present

## 2020-03-25 DIAGNOSIS — Z48812 Encounter for surgical aftercare following surgery on the circulatory system: Secondary | ICD-10-CM | POA: Diagnosis not present

## 2020-03-26 ENCOUNTER — Telehealth: Payer: Self-pay | Admitting: Internal Medicine

## 2020-03-26 NOTE — Telephone Encounter (Signed)
Ok for verbals 

## 2020-03-26 NOTE — Telephone Encounter (Signed)
Sent to Dr. John to advise. 

## 2020-03-26 NOTE — Telephone Encounter (Signed)
    Mickel Baas from [facility] calling to request orders for home PT. 1x week for 8 weeks  Please call [phone], ok to leave message

## 2020-03-29 NOTE — Telephone Encounter (Signed)
Spoke with Mickel Baas of [facility] and was able to give her the verbal OK for Home PT x1 a week for 8 weeks.

## 2020-03-30 ENCOUNTER — Other Ambulatory Visit: Payer: Self-pay

## 2020-03-30 ENCOUNTER — Ambulatory Visit (INDEPENDENT_AMBULATORY_CARE_PROVIDER_SITE_OTHER): Payer: Medicare Other | Admitting: Emergency Medicine

## 2020-03-30 DIAGNOSIS — I442 Atrioventricular block, complete: Secondary | ICD-10-CM | POA: Diagnosis not present

## 2020-03-30 DIAGNOSIS — Z950 Presence of cardiac pacemaker: Secondary | ICD-10-CM | POA: Diagnosis not present

## 2020-03-30 LAB — CUP PACEART INCLINIC DEVICE CHECK
Battery Remaining Longevity: 123 mo
Battery Voltage: 3.21 V
Brady Statistic AP VP Percent: 21.59 %
Brady Statistic AP VS Percent: 0.01 %
Brady Statistic AS VP Percent: 78.05 %
Brady Statistic AS VS Percent: 0.34 %
Brady Statistic RA Percent Paced: 21.23 %
Brady Statistic RV Percent Paced: 99.64 %
Date Time Interrogation Session: 20210810085728
Implantable Lead Implant Date: 20210720
Implantable Lead Implant Date: 20210720
Implantable Lead Location: 753859
Implantable Lead Location: 753860
Implantable Lead Model: 5076
Implantable Lead Model: 5076
Implantable Pulse Generator Implant Date: 20210720
Lead Channel Impedance Value: 285 Ohm
Lead Channel Impedance Value: 304 Ohm
Lead Channel Impedance Value: 361 Ohm
Lead Channel Impedance Value: 361 Ohm
Lead Channel Pacing Threshold Amplitude: 0.75 V
Lead Channel Pacing Threshold Amplitude: 0.75 V
Lead Channel Pacing Threshold Pulse Width: 0.4 ms
Lead Channel Pacing Threshold Pulse Width: 0.4 ms
Lead Channel Sensing Intrinsic Amplitude: 1.625 mV
Lead Channel Sensing Intrinsic Amplitude: 5.5 mV
Lead Channel Setting Pacing Amplitude: 3.5 V
Lead Channel Setting Pacing Amplitude: 3.5 V
Lead Channel Setting Pacing Pulse Width: 0.4 ms
Lead Channel Setting Sensing Sensitivity: 1.2 mV

## 2020-03-30 NOTE — Progress Notes (Signed)
Wound check appointment. Steri-strips removed. Wound without redness or edema. Incision edges approximated, wound well healed. Normal device function. Thresholds, sensing, and impedances consistent with implant measurements. Device programmed at 3.5V with auto capture on for extra safety margin until 3 month visit. Histogram distribution appropriate for patient and level of activity. No mode switches or high ventricular rates noted. Patient educated about wound care, arm mobility, lifting restrictions, and Carelink monitor. Carelink on 06/10/20 and ROV with Dr. Curt Bears on 06/15/20.

## 2020-04-02 DIAGNOSIS — I442 Atrioventricular block, complete: Secondary | ICD-10-CM | POA: Diagnosis not present

## 2020-04-02 DIAGNOSIS — Z950 Presence of cardiac pacemaker: Secondary | ICD-10-CM | POA: Diagnosis not present

## 2020-04-02 DIAGNOSIS — Z794 Long term (current) use of insulin: Secondary | ICD-10-CM | POA: Diagnosis not present

## 2020-04-02 DIAGNOSIS — R600 Localized edema: Secondary | ICD-10-CM | POA: Diagnosis not present

## 2020-04-02 DIAGNOSIS — Z48812 Encounter for surgical aftercare following surgery on the circulatory system: Secondary | ICD-10-CM | POA: Diagnosis not present

## 2020-04-02 DIAGNOSIS — E11311 Type 2 diabetes mellitus with unspecified diabetic retinopathy with macular edema: Secondary | ICD-10-CM | POA: Diagnosis not present

## 2020-04-08 DIAGNOSIS — Z48812 Encounter for surgical aftercare following surgery on the circulatory system: Secondary | ICD-10-CM | POA: Diagnosis not present

## 2020-04-08 DIAGNOSIS — R600 Localized edema: Secondary | ICD-10-CM | POA: Diagnosis not present

## 2020-04-08 DIAGNOSIS — Z794 Long term (current) use of insulin: Secondary | ICD-10-CM | POA: Diagnosis not present

## 2020-04-08 DIAGNOSIS — Z950 Presence of cardiac pacemaker: Secondary | ICD-10-CM | POA: Diagnosis not present

## 2020-04-08 DIAGNOSIS — E11311 Type 2 diabetes mellitus with unspecified diabetic retinopathy with macular edema: Secondary | ICD-10-CM | POA: Diagnosis not present

## 2020-04-08 DIAGNOSIS — I442 Atrioventricular block, complete: Secondary | ICD-10-CM | POA: Diagnosis not present

## 2020-04-12 ENCOUNTER — Telehealth: Payer: Self-pay | Admitting: Internal Medicine

## 2020-04-12 DIAGNOSIS — Z794 Long term (current) use of insulin: Secondary | ICD-10-CM | POA: Diagnosis not present

## 2020-04-12 DIAGNOSIS — E11311 Type 2 diabetes mellitus with unspecified diabetic retinopathy with macular edema: Secondary | ICD-10-CM | POA: Diagnosis not present

## 2020-04-12 DIAGNOSIS — R600 Localized edema: Secondary | ICD-10-CM | POA: Diagnosis not present

## 2020-04-12 DIAGNOSIS — I442 Atrioventricular block, complete: Secondary | ICD-10-CM | POA: Diagnosis not present

## 2020-04-12 DIAGNOSIS — Z950 Presence of cardiac pacemaker: Secondary | ICD-10-CM | POA: Diagnosis not present

## 2020-04-12 DIAGNOSIS — Z48812 Encounter for surgical aftercare following surgery on the circulatory system: Secondary | ICD-10-CM | POA: Diagnosis not present

## 2020-04-12 NOTE — Telephone Encounter (Signed)
Patient assessment, tired and extreme tiredness with   hydrALAZINE (APRESOLINE) 25 MG tablet patient taking every 8 hours  For recommendations or changes, please call [phone] Dwyane Luo [facility] Or the patient at [phone]

## 2020-04-13 NOTE — Telephone Encounter (Signed)
Should ideally be seen in the office.?  What her blood pressure is and if that is the cause of some of her symptoms.  She did not complain of any symptoms 7/29 when she saw Dr. Jenny Reichmann and she had been on that medication at that time.  May need to rule out another cause.

## 2020-04-16 NOTE — Telephone Encounter (Signed)
Spoke with pts and informed her of Dr. Quay Burow note. Pt stated she will call the office back to schedule an apptmnt once she knows of the day and time she can come in.

## 2020-04-21 DIAGNOSIS — R600 Localized edema: Secondary | ICD-10-CM | POA: Diagnosis not present

## 2020-04-21 DIAGNOSIS — Z794 Long term (current) use of insulin: Secondary | ICD-10-CM | POA: Diagnosis not present

## 2020-04-21 DIAGNOSIS — Z48812 Encounter for surgical aftercare following surgery on the circulatory system: Secondary | ICD-10-CM | POA: Diagnosis not present

## 2020-04-21 DIAGNOSIS — E11311 Type 2 diabetes mellitus with unspecified diabetic retinopathy with macular edema: Secondary | ICD-10-CM | POA: Diagnosis not present

## 2020-04-21 DIAGNOSIS — Z950 Presence of cardiac pacemaker: Secondary | ICD-10-CM | POA: Diagnosis not present

## 2020-04-21 DIAGNOSIS — I442 Atrioventricular block, complete: Secondary | ICD-10-CM | POA: Diagnosis not present

## 2020-04-30 DIAGNOSIS — Z48812 Encounter for surgical aftercare following surgery on the circulatory system: Secondary | ICD-10-CM | POA: Diagnosis not present

## 2020-04-30 DIAGNOSIS — I442 Atrioventricular block, complete: Secondary | ICD-10-CM | POA: Diagnosis not present

## 2020-04-30 DIAGNOSIS — Z794 Long term (current) use of insulin: Secondary | ICD-10-CM | POA: Diagnosis not present

## 2020-04-30 DIAGNOSIS — E11311 Type 2 diabetes mellitus with unspecified diabetic retinopathy with macular edema: Secondary | ICD-10-CM | POA: Diagnosis not present

## 2020-04-30 DIAGNOSIS — R600 Localized edema: Secondary | ICD-10-CM | POA: Diagnosis not present

## 2020-04-30 DIAGNOSIS — Z950 Presence of cardiac pacemaker: Secondary | ICD-10-CM | POA: Diagnosis not present

## 2020-05-03 ENCOUNTER — Encounter (HOSPITAL_COMMUNITY): Payer: Self-pay

## 2020-05-03 ENCOUNTER — Observation Stay (HOSPITAL_COMMUNITY)
Admission: EM | Admit: 2020-05-03 | Discharge: 2020-05-06 | Disposition: A | Discharge status: Home / Self Care | Payer: Medicare Other | Attending: Internal Medicine | Admitting: Internal Medicine

## 2020-05-03 ENCOUNTER — Other Ambulatory Visit: Payer: Self-pay

## 2020-05-03 DIAGNOSIS — D649 Anemia, unspecified: Secondary | ICD-10-CM | POA: Diagnosis not present

## 2020-05-03 DIAGNOSIS — Z79899 Other long term (current) drug therapy: Secondary | ICD-10-CM | POA: Insufficient documentation

## 2020-05-03 DIAGNOSIS — Z87891 Personal history of nicotine dependence: Secondary | ICD-10-CM | POA: Diagnosis not present

## 2020-05-03 DIAGNOSIS — Z794 Long term (current) use of insulin: Secondary | ICD-10-CM | POA: Diagnosis not present

## 2020-05-03 DIAGNOSIS — E119 Type 2 diabetes mellitus without complications: Secondary | ICD-10-CM

## 2020-05-03 DIAGNOSIS — D123 Benign neoplasm of transverse colon: Secondary | ICD-10-CM | POA: Diagnosis present

## 2020-05-03 DIAGNOSIS — D124 Benign neoplasm of descending colon: Secondary | ICD-10-CM | POA: Diagnosis present

## 2020-05-03 DIAGNOSIS — D122 Benign neoplasm of ascending colon: Secondary | ICD-10-CM | POA: Diagnosis present

## 2020-05-03 DIAGNOSIS — D500 Iron deficiency anemia secondary to blood loss (chronic): Secondary | ICD-10-CM | POA: Diagnosis not present

## 2020-05-03 DIAGNOSIS — Z860100 Personal history of colon polyps, unspecified: Secondary | ICD-10-CM

## 2020-05-03 DIAGNOSIS — D62 Acute posthemorrhagic anemia: Secondary | ICD-10-CM | POA: Diagnosis present

## 2020-05-03 DIAGNOSIS — K625 Hemorrhage of anus and rectum: Secondary | ICD-10-CM

## 2020-05-03 DIAGNOSIS — R109 Unspecified abdominal pain: Secondary | ICD-10-CM

## 2020-05-03 DIAGNOSIS — Z950 Presence of cardiac pacemaker: Secondary | ICD-10-CM | POA: Insufficient documentation

## 2020-05-03 DIAGNOSIS — K922 Gastrointestinal hemorrhage, unspecified: Secondary | ICD-10-CM | POA: Diagnosis not present

## 2020-05-03 DIAGNOSIS — J45909 Unspecified asthma, uncomplicated: Secondary | ICD-10-CM | POA: Diagnosis not present

## 2020-05-03 DIAGNOSIS — D125 Benign neoplasm of sigmoid colon: Secondary | ICD-10-CM | POA: Diagnosis present

## 2020-05-03 DIAGNOSIS — K921 Melena: Secondary | ICD-10-CM | POA: Diagnosis present

## 2020-05-03 DIAGNOSIS — Z20822 Contact with and (suspected) exposure to covid-19: Secondary | ICD-10-CM | POA: Insufficient documentation

## 2020-05-03 DIAGNOSIS — I10 Essential (primary) hypertension: Secondary | ICD-10-CM | POA: Diagnosis not present

## 2020-05-03 DIAGNOSIS — I129 Hypertensive chronic kidney disease with stage 1 through stage 4 chronic kidney disease, or unspecified chronic kidney disease: Secondary | ICD-10-CM | POA: Insufficient documentation

## 2020-05-03 DIAGNOSIS — E11649 Type 2 diabetes mellitus with hypoglycemia without coma: Secondary | ICD-10-CM

## 2020-05-03 DIAGNOSIS — N183 Chronic kidney disease, stage 3 unspecified: Secondary | ICD-10-CM | POA: Diagnosis not present

## 2020-05-03 DIAGNOSIS — H9190 Unspecified hearing loss, unspecified ear: Secondary | ICD-10-CM

## 2020-05-03 HISTORY — DX: Gastrointestinal hemorrhage, unspecified: K92.2

## 2020-05-03 LAB — CBC
HCT: 33.4 % — ABNORMAL LOW (ref 36.0–46.0)
Hemoglobin: 10.2 g/dL — ABNORMAL LOW (ref 12.0–15.0)
MCH: 26.6 pg (ref 26.0–34.0)
MCHC: 30.5 g/dL (ref 30.0–36.0)
MCV: 87.2 fL (ref 80.0–100.0)
Platelets: 349 10*3/uL (ref 150–400)
RBC: 3.83 MIL/uL — ABNORMAL LOW (ref 3.87–5.11)
RDW: 14.2 % (ref 11.5–15.5)
WBC: 7.8 10*3/uL (ref 4.0–10.5)
nRBC: 0 % (ref 0.0–0.2)

## 2020-05-03 LAB — TYPE AND SCREEN
ABO/RH(D): A POS
Antibody Screen: NEGATIVE

## 2020-05-03 LAB — CBG MONITORING, ED: Glucose-Capillary: 67 mg/dL — ABNORMAL LOW (ref 70–99)

## 2020-05-03 LAB — COMPREHENSIVE METABOLIC PANEL
ALT: 9 U/L (ref 0–44)
AST: 16 U/L (ref 15–41)
Albumin: 2.7 g/dL — ABNORMAL LOW (ref 3.5–5.0)
Alkaline Phosphatase: 61 U/L (ref 38–126)
Anion gap: 14 (ref 5–15)
BUN: 10 mg/dL (ref 8–23)
CO2: 24 mmol/L (ref 22–32)
Calcium: 9.2 mg/dL (ref 8.9–10.3)
Chloride: 103 mmol/L (ref 98–111)
Creatinine, Ser: 1.52 mg/dL — ABNORMAL HIGH (ref 0.44–1.00)
GFR calc Af Amer: 37 mL/min — ABNORMAL LOW (ref >60)
GFR calc non Af Amer: 32 mL/min — ABNORMAL LOW (ref >60)
Glucose, Bld: 75 mg/dL (ref 70–99)
Potassium: 3.9 mmol/L (ref 3.5–5.1)
Sodium: 141 mmol/L (ref 135–145)
Total Bilirubin: 0.5 mg/dL (ref 0.3–1.2)
Total Protein: 6.4 g/dL — ABNORMAL LOW (ref 6.5–8.1)

## 2020-05-03 NOTE — ED Triage Notes (Signed)
Pt arrives to ED w/ c/o blood in stool. Pt states she has had constipation x 3 days, took a laxative last night and had several bowel movements and states one of them had blood in it. Pt reports feeling weak today.

## 2020-05-04 ENCOUNTER — Other Ambulatory Visit: Payer: Self-pay

## 2020-05-04 ENCOUNTER — Inpatient Hospital Stay (HOSPITAL_COMMUNITY): Payer: Medicare Other

## 2020-05-04 ENCOUNTER — Encounter (HOSPITAL_COMMUNITY): Payer: Self-pay | Admitting: Internal Medicine

## 2020-05-04 DIAGNOSIS — I10 Essential (primary) hypertension: Secondary | ICD-10-CM

## 2020-05-04 DIAGNOSIS — N1832 Chronic kidney disease, stage 3b: Secondary | ICD-10-CM

## 2020-05-04 DIAGNOSIS — K922 Gastrointestinal hemorrhage, unspecified: Secondary | ICD-10-CM | POA: Diagnosis present

## 2020-05-04 DIAGNOSIS — Z794 Long term (current) use of insulin: Secondary | ICD-10-CM

## 2020-05-04 DIAGNOSIS — Z86010 Personal history of colonic polyps: Secondary | ICD-10-CM

## 2020-05-04 DIAGNOSIS — E11319 Type 2 diabetes mellitus with unspecified diabetic retinopathy without macular edema: Secondary | ICD-10-CM

## 2020-05-04 DIAGNOSIS — H9190 Unspecified hearing loss, unspecified ear: Secondary | ICD-10-CM

## 2020-05-04 DIAGNOSIS — D62 Acute posthemorrhagic anemia: Secondary | ICD-10-CM | POA: Diagnosis present

## 2020-05-04 DIAGNOSIS — K921 Melena: Secondary | ICD-10-CM | POA: Diagnosis not present

## 2020-05-04 LAB — CBC
HCT: 33.8 % — ABNORMAL LOW (ref 36.0–46.0)
Hemoglobin: 10.3 g/dL — ABNORMAL LOW (ref 12.0–15.0)
MCH: 26.5 pg (ref 26.0–34.0)
MCHC: 30.5 g/dL (ref 30.0–36.0)
MCV: 87.1 fL (ref 80.0–100.0)
Platelets: 340 10*3/uL (ref 150–400)
RBC: 3.88 MIL/uL (ref 3.87–5.11)
RDW: 14.3 % (ref 11.5–15.5)
WBC: 7.1 10*3/uL (ref 4.0–10.5)
nRBC: 0 % (ref 0.0–0.2)

## 2020-05-04 LAB — GLUCOSE, CAPILLARY
Glucose-Capillary: 215 mg/dL — ABNORMAL HIGH (ref 70–99)
Glucose-Capillary: 211 mg/dL — ABNORMAL HIGH (ref 70–99)

## 2020-05-04 LAB — CBG MONITORING, ED: Glucose-Capillary: 163 mg/dL — ABNORMAL HIGH (ref 70–99)

## 2020-05-04 LAB — SARS CORONAVIRUS 2 BY RT PCR (HOSPITAL ORDER, PERFORMED IN ~~LOC~~ HOSPITAL LAB): SARS Coronavirus 2: NEGATIVE

## 2020-05-04 LAB — ABO/RH: ABO/RH(D): A POS

## 2020-05-04 MED ORDER — ACETAMINOPHEN 650 MG RE SUPP: 650.0000 mg | Freq: Four times a day (QID) | RECTAL | Status: DC | PRN

## 2020-05-04 MED ORDER — HYDROCORTISONE ACETATE 25 MG RE SUPP
25.0000 mg | Freq: Two times a day (BID) | RECTAL | Status: DC
  Administered 2020-05-04 – 2020-05-05 (×4): 25 mg via RECTAL
  Filled 2020-05-04 (×5): qty 1

## 2020-05-04 MED ORDER — ACETAMINOPHEN 325 MG PO TABS: 650.0000 mg | ORAL_TABLET | Freq: Four times a day (QID) | ORAL | Status: DC | PRN

## 2020-05-04 MED ORDER — SODIUM CHLORIDE 0.9% FLUSH
3.0000 mL | Freq: Two times a day (BID) | INTRAVENOUS | Status: DC
  Administered 2020-05-04 – 2020-05-05 (×4): 3 mL via INTRAVENOUS

## 2020-05-04 MED ORDER — INSULIN ASPART 100 UNIT/ML ~~LOC~~ SOLN
0.0000 [IU] | Freq: Three times a day (TID) | SUBCUTANEOUS | Status: DC
  Administered 2020-05-04 – 2020-05-05 (×3): 5 [IU] via SUBCUTANEOUS
  Administered 2020-05-05 – 2020-05-06 (×2): 3 [IU] via SUBCUTANEOUS
  Administered 2020-05-06: 5 [IU] via SUBCUTANEOUS

## 2020-05-04 MED ORDER — ONDANSETRON HCL 4 MG/2ML IJ SOLN
4.0000 mg | Freq: Four times a day (QID) | INTRAMUSCULAR | Status: DC | PRN
  Administered 2020-05-06: 4 mg via INTRAVENOUS

## 2020-05-04 MED ORDER — ONDANSETRON HCL 4 MG PO TABS: 4.0000 mg | ORAL_TABLET | Freq: Four times a day (QID) | ORAL | Status: DC | PRN

## 2020-05-04 MED ORDER — ROSUVASTATIN CALCIUM 20 MG PO TABS
40.0000 mg | ORAL_TABLET | Freq: Every day | ORAL | Status: DC
  Administered 2020-05-04 – 2020-05-06 (×3): 40 mg via ORAL
  Filled 2020-05-04 (×3): qty 2
  Filled 2020-05-04: qty 8

## 2020-05-04 MED ORDER — POLYETHYLENE GLYCOL 3350 17 G PO PACK
17.0000 g | PACK | Freq: Every day | ORAL | Status: DC
  Administered 2020-05-04: 17 g via ORAL
  Filled 2020-05-04 (×2): qty 1

## 2020-05-04 MED ORDER — HYDRALAZINE HCL 25 MG PO TABS
25.0000 mg | ORAL_TABLET | Freq: Three times a day (TID) | ORAL | Status: DC
  Administered 2020-05-04 – 2020-05-06 (×8): 25 mg via ORAL
  Filled 2020-05-04 (×8): qty 1

## 2020-05-04 MED ORDER — ALLOPURINOL 300 MG PO TABS
300.0000 mg | ORAL_TABLET | Freq: Two times a day (BID) | ORAL | Status: DC
  Administered 2020-05-04 – 2020-05-06 (×5): 300 mg via ORAL
  Filled 2020-05-04: qty 1
  Filled 2020-05-04: qty 3
  Filled 2020-05-04 (×4): qty 1

## 2020-05-04 MED ORDER — ALBUTEROL SULFATE (2.5 MG/3ML) 0.083% IN NEBU
2.5000 mg | INHALATION_SOLUTION | Freq: Two times a day (BID) | RESPIRATORY_TRACT | Status: DC
  Administered 2020-05-04 – 2020-05-05 (×2): 2.5 mg via RESPIRATORY_TRACT
  Filled 2020-05-04 (×2): qty 3

## 2020-05-04 MED ORDER — ALBUTEROL SULFATE (2.5 MG/3ML) 0.083% IN NEBU
2.5000 mg | INHALATION_SOLUTION | Freq: Four times a day (QID) | RESPIRATORY_TRACT | Status: DC
  Administered 2020-05-04 (×2): 2.5 mg via RESPIRATORY_TRACT
  Filled 2020-05-04 (×2): qty 3

## 2020-05-04 MED ORDER — HYDRALAZINE HCL 20 MG/ML IJ SOLN: 10.0000 mg | INTRAMUSCULAR | Status: DC | PRN

## 2020-05-04 NOTE — Care Management CC44 (Signed)
Condition Code 44 Documentation Completed  Patient Details  Name: Gail Lynch MRN: [id number] Date of Birth: 05-03-1941   Condition Code 44 given:  Yes Patient signature on Condition Code 44 notice:  Yes Documentation of 2 MD's agreement:  Yes Code 44 added to claim:  Yes    Fuller Mandril, RN 05/04/2020, 3:15 PM

## 2020-05-04 NOTE — H&P (Addendum)
History and Physical    Gail Lynch UQJ:[id number] DOB: 1940-08-29 DOA: 05/03/2020  Referring MD/NP/PA: Davonna Belling, MD PCP: Biagio Borg, MD  Consultants: Raynelle Highland, MD Endocrinology- Renato Shin, MD Podiatry -Gardiner Barefoot, D.P.M.  patient coming from: Home  Chief Complaint: Blood in stool  I have personally briefly reviewed patient's old medical records in [place]   HPI: Gail Lynch is a 79 y.o. female with medical history significant of hypertension, hyperlipidemia, diabetes mellitus type 2, CHB s/p permanent pacemaker, adenomatous polyps, and CKD stage III presents after having rectal bleeding yesterday.  History is somewhat limited due to patient being hard of hearing.  Patient notes that she has been constipated over the last 3 days and had taken a laxative yesterday, but may have taken too much.  Thereafter, she had several bowel movements and noted that she had blood present.  She thinks there may have been clots of blood present, but she is not sure.  Patient also reported having blood present in her bed when she got up.  Noted associated symptoms of lower abdominal pain and lightheadedness that have now resolved.  Denies having any loss of consciousness, recent falls, NSAID use, or blood thinners.  Since she arrived to the ER she had one other bowel movement with blood present, but her abdominal pain has resolved.  Last colonoscopy was in 2016 where patient had a sessile polyp removed from the rectum and a 1 cm lipoma in the right colon.  Prior to that patient had a colon where 3 polyps were removed back in 2013.  ED Course: Upon admission into the emergency department patient was seen to be afebrile with blood pressures 156/63-186/63 and all other vital signs maintained.  Labs from 9/13 significant for hemoglobin 10.2 g/dL (prior baseline appears to be from 11-12 g/dL), BUN 10, creatinine 1.52, glucose reported as low as 67, albumin 2.7.  TRH called  to admit.  Review of Systems  Constitutional: Positive for malaise/fatigue. Negative for fever.  HENT: Positive for hearing loss. Negative for sore throat.   Eyes: Negative for photophobia and pain.  Respiratory: Negative for cough and hemoptysis.   Cardiovascular: Negative for chest pain and leg swelling.  Gastrointestinal: Positive for abdominal pain, blood in stool and constipation. Negative for nausea and vomiting.  Genitourinary: Negative for dysuria and frequency.  Musculoskeletal: Negative for falls.  Skin: Negative for rash.  Neurological: Positive for dizziness. Negative for loss of consciousness.  Endo/Heme/Allergies: Does not bruise/bleed easily.  Psychiatric/Behavioral: Negative for substance abuse.    Past Medical History:  Diagnosis Date  . Arthritis    gout in feet  . Asthma    hx as a child - no inhaler, no problems  . Carotid stenosis 06/18/2012  . Colon polyps   . Diabetes mellitus    Type 2  . Dysplasia of cervix, low grade (CIN 1)    s/p cervical conization  . Glaucoma   . Glaucoma 12/18/2011  . Hard of hearing    hear aids - left  . HPV (human papilloma virus) infection   . Hyperlipidemia   . Hypertension   . Intrinsic asthma, unspecified 12/18/2011  . Mild aortic stenosis 03/14/2017  . Mycotic toenails 02/19/2013  . PONV (postoperative nausea and vomiting)   . SVD (spontaneous vaginal delivery)    x 4    Past Surgical History:  Procedure Laterality Date  . BREAST BIOPSY  2010  . BREAST BIOPSY  1991  . COLONOSCOPY    .  DILATION AND CURETTAGE OF UTERUS  2000  . HYSTEROSCOPY WITH D & C N/A 03/04/2014   Procedure: DILATATION AND CURETTAGE /HYSTEROSCOPY;  Surgeon: Lavonia Drafts, MD;  Location: [facility];  Service: Gynecology;  Laterality: N/A;  . KNEE SURGERY  2010   patella fracture  . MULTIPLE TOOTH EXTRACTIONS    . PACEMAKER IMPLANT N/A 03/09/2020   Procedure: PACEMAKER IMPLANT;  Surgeon: Constance Haw, MD;  Location: [facility];  Service: Cardiovascular;  Laterality: N/A;  . POLYPECTOMY       reports that she quit smoking about 39 years ago. Her smoking use included cigarettes. She has quit using smokeless tobacco. She reports that she does not drink alcohol and does not use drugs.  Allergies  Allergen Reactions  . Aspirin Other (See Comments)    BLEEDING BEHIND EYES    Family History  Problem Relation Age of Onset  . Diabetes Brother   . Heart disease Brother   . Heart disease Mother   . Heart disease Father   . Asthma Other   . Colon cancer Neg Hx   . Rectal cancer Neg Hx   . Stomach cancer Neg Hx   . Esophageal cancer Neg Hx     Prior to Admission medications   Medication Sig Start Date End Date Taking? Authorizing Provider  allopurinol (ZYLOPRIM) 300 MG tablet Take 1 tablet (300 mg total) by mouth 2 (two) times daily. 03/18/20  Yes Biagio Borg, MD  hydrALAZINE (APRESOLINE) 25 MG tablet Take 1 tablet (25 mg total) by mouth every 8 (eight) hours. 03/18/20  Yes Biagio Borg, MD  hydrochlorothiazide (MICROZIDE) 12.5 MG capsule Take 1 capsule (12.5 mg total) by mouth daily. 03/18/20 03/18/21 Yes Biagio Borg, MD  Insulin NPH, Human,, Isophane, (HUMULIN N KWIKPEN) 100 UNIT/ML Kiwkpen Inject 55 Units into the skin every morning. Patient taking differently: Inject 45-50 Units into the skin daily.  03/18/20  Yes Biagio Borg, MD  rosuvastatin (CRESTOR) 40 MG tablet Take 1 tablet (40 mg total) by mouth daily. 03/18/20  Yes Biagio Borg, MD  glucose blood (ONE TOUCH ULTRA TEST) test strip 1 each by Other route 2 (two) times daily. And lancets 2/day 250.01 07/09/13   Renato Shin, MD    Physical Exam:  Constitutional: Elderly female who appears to be in NAD, calm, comfortable Vitals:   05/04/20 0326 05/04/20 0555 05/04/20 0756 05/04/20 0820  BP: (!) 185/75 (!) 180/99 (!) 175/70   Pulse: 65 69 80   Resp: (!) 21 18 15    Temp: 97.9 F (36.6 C)  98.3 F (36.8 C)   TempSrc: Oral     SpO2: 100% 100%  100%   Weight:    79.4 kg  Height:    5\' 3"  (1.6 m)   Eyes: PERRL, lids and conjunctivae normal ENMT: Mucous membranes are moist. Posterior pharynx clear of any exudate or lesions. Hard of hearing Neck: normal, supple, no masses, no thyromegaly Respiratory: clear to auscultation bilaterally, no wheezing, no crackles. Normal respiratory effort. No accessory muscle use.  Cardiovascular: Regular rate and rhythm, no murmurs / rubs / gallops. No extremity edema. 2+ pedal pulses. No carotid bruits.  Abdomen: no tenderness, no masses palpated. No hepatosplenomegaly. Bowel sounds positive.  Musculoskeletal: no clubbing / cyanosis. No joint deformity upper and lower extremities. Good ROM, no contractures. Normal muscle tone.  Skin: no rashes, lesions, ulcers. No induration Neurologic: CN 2-12 grossly intact. Sensation intact, DTR normal. Strength 5/5 in all  4.  Psychiatric: Normal mood.  Appears alert and oriented x3.    Labs on Admission: I have personally reviewed following labs and imaging studies  CBC: Recent Labs  Lab 05/03/20 1813  WBC 7.8  HGB 10.2*  HCT 33.4*  MCV 87.2  PLT 993   Basic Metabolic Panel: Recent Labs  Lab 05/03/20 1813  NA 141  K 3.9  CL 103  CO2 24  GLUCOSE 75  BUN 10  CREATININE 1.52*  CALCIUM 9.2   GFR: Estimated Creatinine Clearance: 29 mL/min (A) (by C-G formula based on SCr of 1.52 mg/dL (H)). Liver Function Tests: Recent Labs  Lab 05/03/20 1813  AST 16  ALT 9  ALKPHOS 61  BILITOT 0.5  PROT 6.4*  ALBUMIN 2.7*   No results for input(s): LIPASE, AMYLASE in the last 168 hours. No results for input(s): AMMONIA in the last 168 hours. Coagulation Profile: No results for input(s): INR, PROTIME in the last 168 hours. Cardiac Enzymes: No results for input(s): CKTOTAL, CKMB, CKMBINDEX, TROPONINI in the last 168 hours. BNP (last 3 results) No results for input(s): PROBNP in the last 8760 hours. HbA1C: No results for input(s): HGBA1C in the last  72 hours. CBG: Recent Labs  Lab 05/03/20 1806 05/04/20 0007  GLUCAP 67* 163*   Lipid Profile: No results for input(s): CHOL, HDL, LDLCALC, TRIG, CHOLHDL, LDLDIRECT in the last 72 hours. Thyroid Function Tests: No results for input(s): TSH, T4TOTAL, FREET4, T3FREE, THYROIDAB in the last 72 hours. Anemia Panel: No results for input(s): VITAMINB12, FOLATE, FERRITIN, TIBC, IRON, RETICCTPCT in the last 72 hours. Urine analysis:    Component Value Date/Time   COLORURINE YELLOW 12/11/2017 0619   APPEARANCEUR CLOUDY (A) 12/11/2017 0619   LABSPEC 1.012 12/11/2017 0619   PHURINE 5.0 12/11/2017 0619   GLUCOSEU 150 (A) 12/11/2017 0619   GLUCOSEU NEGATIVE 11/21/2017 1309   HGBUR LARGE (A) 12/11/2017 0619   BILIRUBINUR NEGATIVE 12/11/2017 0619   KETONESUR NEGATIVE 12/11/2017 0619   PROTEINUR NEGATIVE 12/11/2017 0619   UROBILINOGEN 0.2 11/21/2017 1309   NITRITE NEGATIVE 12/11/2017 0619   LEUKOCYTESUR LARGE (A) 12/11/2017 0619   Sepsis Labs: No results found for this or any previous visit (from the past 240 hour(s)).   Radiological Exams on Admission: No results found.    Assessment/Plan Acute blood loss anemia secondary to GI bleed History of colonic polyp: Patient had reported recently being constipated and had a bowel movement yesterday with bright red blood and abdominal pain.  Hemoglobin initially 10.2 g/dL yesterday which is lower than baseline of 11 to 12 g/dL.  Last colonoscopy in 2016 revealed sessile polyp near the rectum which was removed.  Patient denied NSAID use and is not on any blood thinners.  She had been typed and screened for possible need of blood products.  Suspect most likely lower GI bleeding source. -Admit to a medical telemetry bed -Check acute abdominal series -Continue to monitor blood counts   -Transfuse blood products as needed for hemoglobins less than 8 or if patient becomes symptomatic -MiraLAX and suppositories as recommended per GI -[facility]  consulted, and will follow-up for further recommendations  Essential hypertension: Blood pressures initially elevated up to 186/63, but patient had missed some home doses due to being here at the hospital.  Home medications include hydralazine 25 mg 3 times daily and hydrochlorothiazide 12.5 mg daily. -Held hydrochlorothiazide due to complaints of dizziness -Continue hydralazine  Diabetes mellitus type 2 with hypoglycemia: On admission patient's blood sugars noted to be  as low as 67.  Last hemoglobin A1c was noted to be 8.1 on 03/10/2020.  Home medication regimen includes NPH 45-50 units subcu every morning. -Hypoglycemic protocols -Hold home regimen of NPH  -CBGs before every meal and at bedtime with moderate SSI once able to tolerate meal  Complete heart block s/p PPM: Patient reported complaints of dizziness.  Had a [employer]onic dual-chamber PPM placed on 03/09/2020 with Dr. Curt Bears for complete heart block.  -Interrogate pacemaker to ensure it is functioning properly  CKD stage IIIb: On admission creatinine 1.52 which appears relatively around patient's baseline. -Continue to monitor  Hyperlipidemia: Home medications include Crestor 40 mg daily -Continue Crestor  Hard of hearing  DNR: Present on admission.  Patient was asked if her heart were to stop or she were to stop breathing which she want to be resuscitated for which she stated that she is ready to go whenever the Reita Cliche takes her home.  She did not want to be resuscitated and daughter was present at bedside and agreed that this was the patient's wishes.  DVT prophylaxis: scds Code Status: DNR Family Communication: Daughter updated at bedside Disposition Plan: Possible discharge home in 1 to 2 days Consults called: [facility] Admission status: Observation  Norval Morton MD [facility] Pager [phone]   If 7PM-7AM, please contact night-coverage [email or web address] Password [facility]  05/04/2020, 8:52 AM

## 2020-05-04 NOTE — ED Notes (Signed)
Daughter, Lavella Lemons at bedside

## 2020-05-04 NOTE — Care Management Obs Status (Signed)
[facility] NOTIFICATION   Patient Details  Name: Gail Lynch MRN: [id number] Date of Birth: 04/29/1941   Medicare Observation Status Notification Given:  Yes    Fuller Mandril, RN 05/04/2020, 3:15 PM

## 2020-05-04 NOTE — ED Notes (Signed)
Gi at bedside

## 2020-05-04 NOTE — ED Notes (Signed)
Call daughter Oren Bracket at [phone] with an update

## 2020-05-04 NOTE — ED Provider Notes (Signed)
[facility] DEPARTMENT Provider Note   CSN: [id number] Arrival date & time: 05/03/20  1728     History Chief Complaint  Patient presents with  . GI Bleeding    Gail Lynch is a 79 y.o. female.  HPI Patient presents with blood in the stool.  States she had had some constipation over the last 3 days.  States she took a stool softener/laxative.  States had bowel movements and there was blood in someone.  States that she has been feeling lightheaded and dizzy.  States this was even before the constipation.  States that she has a history of GI bleeds.  Has seen Dr. Ardis Hughs in the past.  Not on blood thinners.  States she had a bowel movement while waiting in the ER.  Took around 14 hours for her to get a room.  States that there was blood in that stool too.    Past Medical History:  Diagnosis Date  . Arthritis    gout in feet  . Asthma    hx as a child - no inhaler, no problems  . Carotid stenosis 06/18/2012  . Colon polyps   . Diabetes mellitus    Type 2  . Dysplasia of cervix, low grade (CIN 1)    s/p cervical conization  . Glaucoma   . Glaucoma 12/18/2011  . Hard of hearing    hear aids - left  . HPV (human papilloma virus) infection   . Hyperlipidemia   . Hypertension   . Intrinsic asthma, unspecified 12/18/2011  . Mild aortic stenosis 03/14/2017  . Mycotic toenails 02/19/2013  . PONV (postoperative nausea and vomiting)   . SVD (spontaneous vaginal delivery)    x 4    Patient Active Problem List   Diagnosis Date Noted  . Complete AV block ([place]) 03/09/2020  . Mild aortic stenosis 03/14/2017  . Peripheral edema 03/14/2017  . Arm heaviness 03/14/2017  . CKD (chronic kidney disease), stage III 05/20/2016  . Diabetes ([place]) 01/03/2016  . Dysuria 12/09/2014  . Peripheral neuropathy 12/05/2013  . Irregular heart beat 12/05/2013  . PVC (premature ventricular contraction) 12/05/2013  . Sciatica 06/17/2013  . Pain due to onychomycosis of toenail  05/21/2013  . Mycotic toenails   . Vagina bleeding 08/06/2012  . Carotid stenosis 06/18/2012  . Constipation 04/30/2012  . Childhood asthma 12/18/2011  . Glaucoma 12/18/2011  . Hyperlipidemia 12/18/2011  . Colon polyps 12/18/2011  . Preventative health care 12/17/2011  . Hypertension 12/17/2011  . Hard of hearing   . Dysplasia of cervix, low grade (CIN 1)   . HPV (human papilloma virus) infection   . Brain mass 09/18/2011    Past Surgical History:  Procedure Laterality Date  . BREAST BIOPSY  2010  . BREAST BIOPSY  1991  . COLONOSCOPY    . DILATION AND CURETTAGE OF UTERUS  2000  . HYSTEROSCOPY WITH D & C N/A 03/04/2014   Procedure: DILATATION AND CURETTAGE /HYSTEROSCOPY;  Surgeon: Lavonia Drafts, MD;  Location: [facility];  Service: Gynecology;  Laterality: N/A;  . KNEE SURGERY  2010   patella fracture  . MULTIPLE TOOTH EXTRACTIONS    . PACEMAKER IMPLANT N/A 03/09/2020   Procedure: PACEMAKER IMPLANT;  Surgeon: Constance Haw, MD;  Location: [facility];  Service: Cardiovascular;  Laterality: N/A;  . POLYPECTOMY       OB History    Gravida  3   Para  3   Term  3   Preterm  0   AB  0   Living  3     SAB  0   TAB  0   Ectopic  0   Multiple  0   Live Births              Family History  Problem Relation Age of Onset  . Diabetes Brother   . Heart disease Brother   . Heart disease Mother   . Heart disease Father   . Asthma Other   . Colon cancer Neg Hx   . Rectal cancer Neg Hx   . Stomach cancer Neg Hx   . Esophageal cancer Neg Hx     Social History   Tobacco Use  . Smoking status: Former Smoker    Types: Cigarettes    Quit date: 08/21/1980    Years since quitting: 39.7  . Smokeless tobacco: Former Network engineer Use Topics  . Alcohol use: No    Alcohol/week: 0.0 standard drinks  . Drug use: No    Home Medications Prior to Admission medications   Medication Sig Start Date End Date Taking? Authorizing Provider    allopurinol (ZYLOPRIM) 300 MG tablet Take 1 tablet (300 mg total) by mouth 2 (two) times daily. 03/18/20   Biagio Borg, MD  glucose blood (ONE TOUCH ULTRA TEST) test strip 1 each by Other route 2 (two) times daily. And lancets 2/day 250.01 07/09/13   Renato Shin, MD  hydrALAZINE (APRESOLINE) 25 MG tablet Take 1 tablet (25 mg total) by mouth every 8 (eight) hours. 03/18/20   Biagio Borg, MD  hydrochlorothiazide (MICROZIDE) 12.5 MG capsule Take 1 capsule (12.5 mg total) by mouth daily. 03/18/20 03/18/21  Biagio Borg, MD  Insulin NPH, Human,, Isophane, (HUMULIN N [facility]) 100 UNIT/ML Kiwkpen Inject 55 Units into the skin every morning. 03/18/20   Biagio Borg, MD  rosuvastatin (CRESTOR) 40 MG tablet Take 1 tablet (40 mg total) by mouth daily. 03/18/20   Biagio Borg, MD    Allergies    Aspirin, Penicillin g, and Penicillins  Review of Systems   Review of Systems  Constitutional: Negative for appetite change.  HENT: Negative for congestion.   Respiratory: Negative for shortness of breath.   Gastrointestinal: Positive for blood in stool and constipation. Negative for abdominal pain.  Genitourinary: Negative for flank pain.  Musculoskeletal: Negative for back pain.  Skin: Negative for rash.  Neurological: Positive for light-headedness.  Psychiatric/Behavioral: Negative for confusion.    Physical Exam Updated Vital Signs BP (!) 175/70   Pulse 80   Temp 98.3 F (36.8 C)   Resp 15   Ht 5\' 3"  (1.6 m)   Wt 79.4 kg   SpO2 100%   BMI 31.00 kg/m   Physical Exam Vitals and nursing note reviewed.  HENT:     Head: Normocephalic.  Eyes:     Extraocular Movements: Extraocular movements intact.     Pupils: Pupils are equal, round, and reactive to light.  Cardiovascular:     Rate and Rhythm: Regular rhythm.  Pulmonary:     Breath sounds: No wheezing or rhonchi.  Abdominal:     Tenderness: There is no abdominal tenderness.  Musculoskeletal:     Right lower leg: No edema.     Left  lower leg: No edema.  Skin:    Capillary Refill: Capillary refill takes less than 2 seconds.     Coloration: Skin is not pale.  Neurological:     Mental Status:  She is alert and oriented to person, place, and time.   Patient does have a hemorrhoid on external rectal exam.  Did have red blood in brief.  ED Results / Procedures / Treatments   Labs (all labs ordered are listed, but only abnormal results are displayed) Labs Reviewed  COMPREHENSIVE METABOLIC PANEL - Abnormal; Notable for the following components:      Result Value   Creatinine, Ser 1.52 (*)    Total Protein 6.4 (*)    Albumin 2.7 (*)    GFR calc non Af Amer 32 (*)    GFR calc Af Amer 37 (*)    All other components within normal limits  CBC - Abnormal; Notable for the following components:   RBC 3.83 (*)    Hemoglobin 10.2 (*)    HCT 33.4 (*)    All other components within normal limits  CBG MONITORING, ED - Abnormal; Notable for the following components:   Glucose-Capillary 67 (*)    All other components within normal limits  CBG MONITORING, ED - Abnormal; Notable for the following components:   Glucose-Capillary 163 (*)    All other components within normal limits  TYPE AND SCREEN  ABO/RH    EKG None  Radiology No results found.  Procedures Procedures (including critical care time)  Medications Ordered in ED Medications - No data to display  ED Course  I have reviewed the triage vital signs and the nursing notes.  Pertinent labs & imaging results that were available during my care of the patient were reviewed by me and considered in my medical decision making (see chart for details).    MDM Rules/Calculators/A&P                          Patient with GI bleed.  Likely lower.  Mild anemia with hemoglobin 10 however symptomatic.  States she is lightheaded and dizzy.  Has had red blood in the ER also.  Has seen [facility] in the past.  With symptomatic anemia will admit to hospitalist. Final Clinical  Impression(s) / ED Diagnoses Final diagnoses:  Gastrointestinal hemorrhage, unspecified gastrointestinal hemorrhage type  Anemia, unspecified type    Rx / DC Orders ED Discharge Orders    None       Davonna Belling, MD 05/04/20 [phone]

## 2020-05-04 NOTE — Progress Notes (Signed)
[facility] Consult: 9:57 AM 05/04/2020  LOS: 0 days    Referring Provider: Dr Tamala Julian  Primary Care Physician:  Biagio Borg, MD Primary Gastroenterologist:  Dr. Owens Loffler daughter Sadric at [phone] Daughter Taya [phone]   Reason for Consultation: Blood in stool.   HPI: Gail Lynch is a 79 y.o. female.  PMH CKD.  IDDM.  03/09/2020 cardiac pacemaker placement for bradycardia.  Carotid stenosis.  Hypertension.  Peripheral edema. 02/2014 CTAP for abdominal pain following hysteroscopy showed moderate ascites, loculated fluid around the uterus, free intraperitoneal air c/w uterine perf.  12/2014 colonoscopy.  For surveillance of 3 adenomatous polyps on 2013 colonoscopy.  Lipoma in right colon.  4 mm sessile polyp in rectum, removed ( benign lymphoid polyp).  For 3 days PTA patient was constipated.  She took 4 doses of Miralax at one time on Sunday night.  Subsequently had several loose, non-bloody bowel movements.  Woke up Monday AM and felt "wet", saw pool of blood on sheets in region of her buttocks.    She felt weak on Monday and came to the ED, waited 14 hours to get seen.   Llightheadedness, dizziness occurrs intermittenltly as well.  Passed some blood w brown stool while in waiting room.   BPs 150s -180s/60s-70s.    Hgb 10.2, 12.3 on 03/10/20.  MCV 87, stable C/W July. BUN not elevated.  Pt is vague historian.  She denies abd or rectal pain.  Endorses occasional constipation she treats w prn Miralax.  No ASA, NSAIDs.  No n/v.    Lives independently.  She still cooks and cleans.  Has had assistance coming in to work with her for PT.    Past Medical History:  Diagnosis Date  . Arthritis    gout in feet  . Asthma    hx as a child - no inhaler, no problems  . Carotid stenosis 06/18/2012   . Colon polyps   . Diabetes mellitus    Type 2  . Dysplasia of cervix, low grade (CIN 1)    s/p cervical conization  . Glaucoma   . Glaucoma 12/18/2011  . Hard of hearing    hear aids - left  . HPV (human papilloma virus) infection   . Hyperlipidemia   . Hypertension   . Intrinsic asthma, unspecified 12/18/2011  . Mild aortic stenosis 03/14/2017  . Mycotic toenails 02/19/2013  . PONV (postoperative nausea and vomiting)   . SVD (spontaneous vaginal delivery)    x 4    Past Surgical History:  Procedure Laterality Date  . BREAST BIOPSY  2010  . BREAST BIOPSY  1991  . COLONOSCOPY    . DILATION AND CURETTAGE OF UTERUS  2000  . HYSTEROSCOPY WITH D & C N/A 03/04/2014   Procedure: DILATATION AND CURETTAGE /HYSTEROSCOPY;  Surgeon: Lavonia Drafts, MD;  Location: [facility];  Service: Gynecology;  Laterality: N/A;  . KNEE SURGERY  2010   patella fracture  . MULTIPLE TOOTH EXTRACTIONS    . PACEMAKER IMPLANT N/A 03/09/2020   Procedure: PACEMAKER IMPLANT;  Surgeon: Constance Haw, MD;  Location: [facility];  Service: Cardiovascular;  Laterality: N/A;  . POLYPECTOMY      Prior to Admission medications   Medication Sig Start Date End Date Taking? Authorizing Provider  allopurinol (ZYLOPRIM) 300 MG tablet Take 1 tablet (300 mg total) by mouth 2 (two) times daily. 03/18/20  Yes Biagio Borg, MD  hydrALAZINE (APRESOLINE) 25 MG tablet Take 1 tablet (25 mg total) by mouth every 8 (eight) hours. 03/18/20  Yes Biagio Borg, MD  hydrochlorothiazide (MICROZIDE) 12.5 MG capsule Take 1 capsule (12.5 mg total) by mouth daily. 03/18/20 03/18/21 Yes Biagio Borg, MD  Insulin NPH, Human,, Isophane, (HUMULIN N KWIKPEN) 100 UNIT/ML Kiwkpen Inject 55 Units into the skin every morning. Patient taking differently: Inject 45-50 Units into the skin daily.  03/18/20  Yes Biagio Borg, MD  rosuvastatin (CRESTOR) 40 MG tablet Take 1 tablet (40 mg total) by mouth daily. 03/18/20  Yes Biagio Borg, MD    glucose blood (ONE TOUCH ULTRA TEST) test strip 1 each by Other route 2 (two) times daily. And lancets 2/day 250.01 07/09/13   Renato Shin, MD    Scheduled Meds: . albuterol  2.5 mg Nebulization Q6H  . allopurinol  300 mg Oral BID  . hydrALAZINE  25 mg Oral Q8H  . rosuvastatin  40 mg Oral Daily  . sodium chloride flush  3 mL Intravenous Q12H   Infusions:  PRN Meds: acetaminophen **OR** acetaminophen, hydrALAZINE, ondansetron **OR** ondansetron (ZOFRAN) IV   Allergies as of 05/03/2020 - Review Complete 05/03/2020  Allergen Reaction Noted  . Aspirin Other (See Comments) 10/03/2011  . Penicillin g Other (See Comments) 11/14/2011  . Penicillins Other (See Comments) 09/05/2011    Family History  Problem Relation Age of Onset  . Diabetes Brother   . Heart disease Brother   . Heart disease Mother   . Heart disease Father   . Asthma Other   . Colon cancer Neg Hx   . Rectal cancer Neg Hx   . Stomach cancer Neg Hx   . Esophageal cancer Neg Hx     Social History   Socioeconomic History  . Marital status: Widowed    Spouse name: Not on file  . Number of children: 3  . Years of education: Not on file  . Highest education level: Not on file  Occupational History  . Occupation: retired  Tobacco Use  . Smoking status: Former Smoker    Types: Cigarettes    Quit date: 08/21/1980    Years since quitting: 39.7  . Smokeless tobacco: Former Network engineer and Sexual Activity  . Alcohol use: No    Alcohol/week: 0.0 standard drinks  . Drug use: No  . Sexual activity: Yes    Birth control/protection: Post-menopausal  Other Topics Concern  . Not on file  Social History Narrative  . Not on file   Social Determinants of Health   Financial Resource Strain:   . Difficulty of Paying Living Expenses: Not on file  Food Insecurity:   . Worried About Charity fundraiser in the Last Year: Not on file  . Ran Out of Food in the Last Year: Not on file  Transportation Needs:   .  Lack of Transportation (Medical): Not on file  . Lack of Transportation (Non-Medical): Not on file  Physical Activity:   . Days of Exercise per Week: Not on file  . Minutes of Exercise per Session: Not on  file  Stress:   . Feeling of Stress : Not on file  Social Connections:   . Frequency of Communication with Friends and Family: Not on file  . Frequency of Social Gatherings with Friends and Family: Not on file  . Attends Religious Services: Not on file  . Active Member of Clubs or Organizations: Not on file  . Attends Archivist Meetings: Not on file  . Marital Status: Not on file  Intimate Partner Violence:   . Fear of Current or Ex-Partner: Not on file  . Emotionally Abused: Not on file  . Physically Abused: Not on file  . Sexually Abused: Not on file    REVIEW OF SYSTEMS: Constitutional: No profound weakness or fatigue ENT:  No nose bleeds Pulm: No shortness of breath, no cough. CV:  No palpitations, no LE edema.  No chest pressure GU:  No hematuria, no frequency GI: See HPI.  No heartburn, no indigestion, no nausea or vomiting.  No dysphagia. Heme: No excessive or unusual bleeding or bruising Transfusions: None Neuro:  No headaches, no peripheral tingling or numbness.  Slight dizziness not any worse than usual intermittent episodes. Derm:  No itching, no rash or sores.  Endocrine:  No sweats or chills.  No polyuria or dysuria Immunization: Not queried Travel:  None beyond local counties in last few months.    PHYSICAL EXAM: Vital signs in last 24 hours: Vitals:   05/04/20 0555 05/04/20 0756  BP: (!) 180/99 (!) 175/70  Pulse: 69 80  Resp: 18 15  Temp:  98.3 F (36.8 C)  SpO2: 100% 100%   Wt Readings from Last 3 Encounters:  05/04/20 79.4 kg  03/18/20 86.2 kg  03/10/20 87.1 kg    General: Calm, pleasant elderly.  Does not look ill.  Comfortable Head: No facial asymmetry or swelling.  No signs of head trauma. Eyes: No conjunctival pallor Ears: Hard  of hearing Nose: No discharge or congestion Mouth: Edentulous.  Upper dentures in place.  Mucosa moist, pink, clear.  Tongue midline Neck: No masses, no thyromegaly, no JVD Lungs: Clear bilaterally with good breath sounds.  No labored breathing or cough Heart: Pacemaker positioned in upper left chest, scar well-healed, site not tender or bruised.  RRR.  No MRG.  S1, S2 present Abdomen: Soft.  Not tender or distended.  Bowel sounds active.  No HSM, masses, bruits, hernias.   Rectal: Brown stool in rectal vault.  No masses.  Visible external hemorrhoids, no blood or thrombosis. Musc/Skeltl: No joint redness, swelling or gross deformity Extremities: No CCE. Neurologic: Alert.  Oriented to time, place, person.  Has trouble recalling details of medical history however.  Follows all commands.  No tremors or involuntary movement.  No gross deficits. Skin: No rash, sores or suspicious lesions Tattoos: None Nodes: No cervical adenopathy Psych: Calm, pleasant, cooperative.  Intake/Output from previous day: No intake/output data recorded. Intake/Output this shift: No intake/output data recorded.  LAB RESULTS: Recent Labs    05/03/20 1813  WBC 7.8  HGB 10.2*  HCT 33.4*  PLT 349   BMET Lab Results  Component Value Date   NA 141 05/03/2020   NA 140 03/10/2020   NA 141 03/09/2020   K 3.9 05/03/2020   K 4.4 03/10/2020   K 4.3 03/09/2020   CL 103 05/03/2020   CL 105 03/10/2020   CL 104 03/09/2020   CO2 24 05/03/2020   CO2 18 (L) 03/10/2020   CO2 20 (L) 03/09/2020   GLUCOSE  75 05/03/2020   GLUCOSE 280 (H) 03/10/2020   GLUCOSE 143 (H) 03/09/2020   BUN 10 05/03/2020   BUN 29 (H) 03/10/2020   BUN 29 (H) 03/09/2020   CREATININE 1.52 (H) 05/03/2020   CREATININE 1.74 (H) 03/10/2020   CREATININE 2.04 (H) 03/09/2020   CALCIUM 9.2 05/03/2020   CALCIUM 9.1 03/10/2020   CALCIUM 9.2 03/09/2020   LFT Recent Labs    05/03/20 1813  PROT 6.4*  ALBUMIN 2.7*  AST 16  ALT 9  ALKPHOS 61   BILITOT 0.5   PT/INR Lab Results  Component Value Date   INR 1.1 (H) 09/08/2013   INR 1.2 09/04/2008   INR 1.2 09/03/2008   Lipase     Component Value Date/Time   LIPASE 26 12/11/2017 0140     RADIOLOGY STUDIES: No results found.   IMPRESSION:   *   Painless hematochezia in pt who took multiple doses of miralax after 3 d w/o BM.   ? Hemorrhoidal source,  ? Stercoral ulcer, ? divertic bleed, however no tics noted on colonoscopies.      PLAN:     *   Observe for now.  ? Perform colonoscopy?: defer to Dr Tarri Glenn.   As bleeding has ceased, do not feel need to pursue CTAP w angio or nuc med bleeding scan. Allow HH, CM diet.   Add Anusol suppositories bid, add Miralax 17 gm/1 x daily.     Azucena Freed  05/04/2020, 9:57 AM Phone [phone]

## 2020-05-05 DIAGNOSIS — K625 Hemorrhage of anus and rectum: Secondary | ICD-10-CM | POA: Diagnosis not present

## 2020-05-05 DIAGNOSIS — H9190 Unspecified hearing loss, unspecified ear: Secondary | ICD-10-CM | POA: Diagnosis not present

## 2020-05-05 DIAGNOSIS — K921 Melena: Secondary | ICD-10-CM

## 2020-05-05 DIAGNOSIS — E11319 Type 2 diabetes mellitus with unspecified diabetic retinopathy without macular edema: Secondary | ICD-10-CM | POA: Diagnosis not present

## 2020-05-05 DIAGNOSIS — K922 Gastrointestinal hemorrhage, unspecified: Secondary | ICD-10-CM | POA: Diagnosis not present

## 2020-05-05 DIAGNOSIS — Z86010 Personal history of colonic polyps: Secondary | ICD-10-CM | POA: Diagnosis not present

## 2020-05-05 DIAGNOSIS — D62 Acute posthemorrhagic anemia: Secondary | ICD-10-CM

## 2020-05-05 DIAGNOSIS — N1832 Chronic kidney disease, stage 3b: Secondary | ICD-10-CM | POA: Diagnosis not present

## 2020-05-05 DIAGNOSIS — N183 Chronic kidney disease, stage 3 unspecified: Secondary | ICD-10-CM

## 2020-05-05 LAB — CBC
HCT: 30 % — ABNORMAL LOW (ref 36.0–46.0)
Hemoglobin: 9.5 g/dL — ABNORMAL LOW (ref 12.0–15.0)
MCH: 27.5 pg (ref 26.0–34.0)
MCHC: 31.7 g/dL (ref 30.0–36.0)
MCV: 86.7 fL (ref 80.0–100.0)
Platelets: 305 10*3/uL (ref 150–400)
RBC: 3.46 MIL/uL — ABNORMAL LOW (ref 3.87–5.11)
RDW: 14.2 % (ref 11.5–15.5)
WBC: 7.2 10*3/uL (ref 4.0–10.5)
nRBC: 0 % (ref 0.0–0.2)

## 2020-05-05 LAB — BASIC METABOLIC PANEL
Anion gap: 9 (ref 5–15)
BUN: 14 mg/dL (ref 8–23)
CO2: 25 mmol/L (ref 22–32)
Calcium: 8.5 mg/dL — ABNORMAL LOW (ref 8.9–10.3)
Chloride: 103 mmol/L (ref 98–111)
Creatinine, Ser: 1.58 mg/dL — ABNORMAL HIGH (ref 0.44–1.00)
GFR calc Af Amer: 35 mL/min — ABNORMAL LOW (ref >60)
GFR calc non Af Amer: 30 mL/min — ABNORMAL LOW (ref >60)
Glucose, Bld: 305 mg/dL — ABNORMAL HIGH (ref 70–99)
Potassium: 4.4 mmol/L (ref 3.5–5.1)
Sodium: 137 mmol/L (ref 135–145)

## 2020-05-05 LAB — GLUCOSE, CAPILLARY
Glucose-Capillary: 235 mg/dL — ABNORMAL HIGH (ref 70–99)
Glucose-Capillary: 193 mg/dL — ABNORMAL HIGH (ref 70–99)
Glucose-Capillary: 237 mg/dL — ABNORMAL HIGH (ref 70–99)
Glucose-Capillary: 81 mg/dL (ref 70–99)

## 2020-05-05 MED ORDER — BISACODYL 5 MG PO TBEC
20.0000 mg | DELAYED_RELEASE_TABLET | Freq: Once | ORAL | Status: AC
  Administered 2020-05-05: 20 mg via ORAL
  Filled 2020-05-05: qty 4

## 2020-05-05 MED ORDER — ALBUTEROL SULFATE (2.5 MG/3ML) 0.083% IN NEBU: 2.5000 mg | INHALATION_SOLUTION | Freq: Four times a day (QID) | RESPIRATORY_TRACT | Status: DC | PRN

## 2020-05-05 MED ORDER — METOCLOPRAMIDE HCL 5 MG/ML IJ SOLN
10.0000 mg | Freq: Once | INTRAMUSCULAR | Status: AC
  Administered 2020-05-05: 10 mg via INTRAVENOUS
  Filled 2020-05-05: qty 2

## 2020-05-05 MED ORDER — POLYETHYLENE GLYCOL 3350 17 G PO PACK: 17.0000 g | PACK | Freq: Every day | ORAL | Status: DC

## 2020-05-05 MED ORDER — PEG-KCL-NACL-NASULF-NA ASC-C 100 G PO SOLR
0.5000 | Freq: Once | ORAL | Status: AC
  Administered 2020-05-05: 100 g via ORAL
  Filled 2020-05-05: qty 1

## 2020-05-05 MED ORDER — PEG-KCL-NACL-NASULF-NA ASC-C 100 G PO SOLR: 1.0000 | Freq: Once | ORAL | Status: DC

## 2020-05-05 NOTE — Progress Notes (Signed)
Inpatient Diabetes Program Recommendations  AACE/ADA: New Consensus Statement on Inpatient Glycemic Control (2015)  Target Ranges:  Prepandial:   less than 140 mg/dL      Peak postprandial:   less than 180 mg/dL (1-2 hours)      Critically ill patients:  140 - 180 mg/dL   Lab Results  Component Value Date   GLUCAP 193 (H) 05/05/2020   HGBA1C 8.1 (H) 03/10/2020    Review of Glycemic Control Results for FORREST, DEMURO (MRN [id number]) as of 05/05/2020 12:03  Ref. Range 05/04/2020 17:19 05/04/2020 21:36 05/05/2020 06:24 05/05/2020 11:08  Glucose-Capillary Latest Ref Range: 70 - 99 mg/dL 215 (H) 211 (H) 235 (H) 193 (H)   Diabetes history: Type 2 DM Outpatient Diabetes medications: NPH 55 units QAM Current orders for Inpatient glycemic control: Novolog 0-15 units TID  Inpatient Diabetes Program Recommendations:   Noted NPO in preparation for 9/16.   Consider adding Levemir 8 units QD.   Thanks, Bronson Curb, MSN, RNC-OB Diabetes Coordinator [phone] (8a-5p)

## 2020-05-05 NOTE — Progress Notes (Addendum)
Daily Rounding Note  05/05/2020, 10:04 AM  LOS: 1 day   SUBJECTIVE:   Chief complaint:  Painless hematochezia.     Dark stool last PM, none yet today.  No abd pain, tolerating clears.  Not dizzy or weak  OBJECTIVE:         Vital signs in last 24 hours:    Temp:  [98.4 F (36.9 C)-98.6 F (37 C)] 98.5 F (36.9 C) (09/15 0509) Pulse Rate:  [82-98] 86 (09/15 0509) Resp:  [12-21] 18 (09/15 0509) BP: (117-153)/(56-87) 153/70 (09/15 0509) SpO2:  [92 %-99 %] 98 % (09/15 0509) Last BM Date: 05/04/20 Filed Weights   05/04/20 0820  Weight: 79.4 kg   General: alert, comfortable, no looking ill   Heart: RRR Chest: clear bil Abdomen: soft, NT, ND.  Active BS  Extremities: no CCE Neuro/Psych:  Alert.   Oriented x 3.  Slow response time but clear appropriate speech.  HOH  Intake/Output from previous day: 09/14 0701 - 09/15 0700 In: 240 [P.O.:240] Out: -   Intake/Output this shift: No intake/output data recorded.  Lab Results: Recent Labs    05/03/20 1813 05/04/20 1015 05/05/20 0302  WBC 7.8 7.1 7.2  HGB 10.2* 10.3* 9.5*  HCT 33.4* 33.8* 30.0*  PLT 349 340 305   BMET Recent Labs    05/03/20 1813 05/05/20 0302  NA 141 137  K 3.9 4.4  CL 103 103  CO2 24 25  GLUCOSE 75 305*  BUN 10 14  CREATININE 1.52* 1.58*  CALCIUM 9.2 8.5*   LFT Recent Labs    05/03/20 1813  PROT 6.4*  ALBUMIN 2.7*  AST 16  ALT 9  ALKPHOS 61  BILITOT 0.5   PT/INR No results for input(s): LABPROT, INR in the last 72 hours. Hepatitis Panel No results for input(s): HEPBSAG, HCVAB, HEPAIGM, HEPBIGM in the last 72 hours.  Studies/Results: DG ABD ACUTE 2+V W 1V CHEST  Result Date: 05/04/2020 CLINICAL DATA:  Blood in stool. EXAM: X-RAY ABDOMEN 2 VIEW COMPARISON:  Abdomen and pelvis CT 03/04/2014 FINDINGS: A few left colonic fluid levels are noted. No obstructive pattern or detected fold thickening. Streaky opacity at the lung  bases which was also seen July 2021. Chronic cardiomegaly. Dual-chamber pacer leads from the left. Negative for effusion or pneumothorax. IMPRESSION: 1. Multiple segments of gas distended colon with a few distal fluid levels, likely related to history of laxative use. 2. Pulmonary scarring. Electronically Signed   By: Monte Fantasia M.D.   On: 05/04/2020 11:41    ASSESMENT:   *   Bleeding PR.  Occurred in setting of large dose Miralax after 3 to 4 d of constipation.  Hemorrhoids on exam, suspect these may be culprit Adenomatous colon polyp in 2013, no recurrence (non adenomatous polyps only) in 2016.   No recurrent bleeding.     PLAN   *    Colonoscopy 9/16 at 50 AM w Dr Hilarie Fredrickson.  Stay on clears.  Prep w dulcolax, moviprep, reglan.      Azucena Freed  05/05/2020, 10:04 AM    Attending physician's note   I have taken an interval history, reviewed the chart and examined the patient. I agree with the Advanced Practitioner's note, impression and recommendations.   79 year old very pleasant female admitted with hematochezia  History of adenomas X3 removed on colonoscopy in 2013.  Also had internal and external hemorrhoids.  Most recent colonoscopy in 2016 with no adenomatous polyps  We will proceed with colonoscopy for further evaluation for evaluation of rectal bleeding and recent change in bowel habits  Hemoglobin slowly trending down, continue to monitor daily and transfuse as needed  Clear liquids and bowel prep The risks and benefits as well as alternatives of endoscopic procedure(s) have been discussed and reviewed. All questions answered. The patient agrees to proceed.  I have spent 25 minutes of patient care (this includes precharting, chart review, review of results, face-to-face time used for counseling as well as treatment plan and follow-up. The patient was provided an opportunity to ask questions and all were answered. The patient agreed with the plan and demonstrated an  understanding of the instructions.  Damaris Hippo , MD [phone]     Phone [phone]

## 2020-05-05 NOTE — H&P (View-Only) (Signed)
Daily Rounding Note  05/05/2020, 10:04 AM  LOS: 1 day   SUBJECTIVE:   Chief complaint:  Painless hematochezia.     Dark stool last PM, none yet today.  No abd pain, tolerating clears.  Not dizzy or weak  OBJECTIVE:         Vital signs in last 24 hours:    Temp:  [98.4 F (36.9 C)-98.6 F (37 C)] 98.5 F (36.9 C) (09/15 0509) Pulse Rate:  [82-98] 86 (09/15 0509) Resp:  [12-21] 18 (09/15 0509) BP: (117-153)/(56-87) 153/70 (09/15 0509) SpO2:  [92 %-99 %] 98 % (09/15 0509) Last BM Date: 05/04/20 Filed Weights   05/04/20 0820  Weight: 79.4 kg   General: alert, comfortable, no looking ill   Heart: RRR Chest: clear bil Abdomen: soft, NT, ND.  Active BS  Extremities: no CCE Neuro/Psych:  Alert.   Oriented x 3.  Slow response time but clear appropriate speech.  HOH  Intake/Output from previous day: 09/14 0701 - 09/15 0700 In: 240 [P.O.:240] Out: -   Intake/Output this shift: No intake/output data recorded.  Lab Results: Recent Labs    05/03/20 1813 05/04/20 1015 05/05/20 0302  WBC 7.8 7.1 7.2  HGB 10.2* 10.3* 9.5*  HCT 33.4* 33.8* 30.0*  PLT 349 340 305   BMET Recent Labs    05/03/20 1813 05/05/20 0302  NA 141 137  K 3.9 4.4  CL 103 103  CO2 24 25  GLUCOSE 75 305*  BUN 10 14  CREATININE 1.52* 1.58*  CALCIUM 9.2 8.5*   LFT Recent Labs    05/03/20 1813  PROT 6.4*  ALBUMIN 2.7*  AST 16  ALT 9  ALKPHOS 61  BILITOT 0.5   PT/INR No results for input(s): LABPROT, INR in the last 72 hours. Hepatitis Panel No results for input(s): HEPBSAG, HCVAB, HEPAIGM, HEPBIGM in the last 72 hours.  Studies/Results: DG ABD ACUTE 2+V W 1V CHEST  Result Date: 05/04/2020 CLINICAL DATA:  Blood in stool. EXAM: X-RAY ABDOMEN 2 VIEW COMPARISON:  Abdomen and pelvis CT 03/04/2014 FINDINGS: A few left colonic fluid levels are noted. No obstructive pattern or detected fold thickening. Streaky opacity at the lung  bases which was also seen July 2021. Chronic cardiomegaly. Dual-chamber pacer leads from the left. Negative for effusion or pneumothorax. IMPRESSION: 1. Multiple segments of gas distended colon with a few distal fluid levels, likely related to history of laxative use. 2. Pulmonary scarring. Electronically Signed   By: Monte Fantasia M.D.   On: 05/04/2020 11:41    ASSESMENT:   *   Bleeding PR.  Occurred in setting of large dose Miralax after 3 to 4 d of constipation.  Hemorrhoids on exam, suspect these may be culprit Adenomatous colon polyp in 2013, no recurrence (non adenomatous polyps only) in 2016.   No recurrent bleeding.     PLAN   *    Colonoscopy 9/16 at 81 AM w Dr Hilarie Fredrickson.  Stay on clears.  Prep w dulcolax, moviprep, reglan.      Azucena Freed  05/05/2020, 10:04 AM    Attending physician's note   I have taken an interval history, reviewed the chart and examined the patient. I agree with the Advanced Practitioner's note, impression and recommendations.   79 year old very pleasant female admitted with hematochezia  History of adenomas X3 removed on colonoscopy in 2013.  Also had internal and external hemorrhoids.  Most recent colonoscopy in 2016 with no adenomatous polyps  We will proceed with colonoscopy for further evaluation for evaluation of rectal bleeding and recent change in bowel habits  Hemoglobin slowly trending down, continue to monitor daily and transfuse as needed  Clear liquids and bowel prep The risks and benefits as well as alternatives of endoscopic procedure(s) have been discussed and reviewed. All questions answered. The patient agrees to proceed.  I have spent 25 minutes of patient care (this includes precharting, chart review, review of results, face-to-face time used for counseling as well as treatment plan and follow-up. The patient was provided an opportunity to ask questions and all were answered. The patient agreed with the plan and demonstrated an  understanding of the instructions.  Damaris Hippo , MD [phone]     Phone [phone]

## 2020-05-05 NOTE — Progress Notes (Signed)
PROGRESS NOTE    Saran Laviolette  HCW:[id number] DOB: [birth year] DOA: 05/03/2020 PCP: Biagio Borg, MD    Brief Narrative:  Gail Lynch is a 79 y.o. female with medical history significant of hypertension, hyperlipidemia, diabetes mellitus type 2, CHB s/p permanent pacemaker, adenomatous polyps, and CKD stage III presents after having rectal bleeding yesterday.  History is somewhat limited due to patient being hard of hearing.  Patient notes that she has been constipated over the last 3 days and had taken a laxative yesterday, but may have taken too much.  Thereafter, she had several bowel movements and noted that she had blood present.  She thinks there may have been clots of blood present, but she is not sure.  Patient also reported having blood present in her bed when she got up.  Noted associated symptoms of lower abdominal pain and lightheadedness that have now resolved.  Denies having any loss of consciousness, recent falls, NSAID use, or blood thinners.  Since she arrived to the ER she had one other bowel movement with blood present, but her abdominal pain has resolved.  Last colonoscopy was in 2016 where patient had a sessile polyp removed from the rectum and a 1 cm lipoma in the right colon.  Prior to that patient had a colon where 3 polyps were removed back in 2013.  Upon admission into the emergency department patient was seen to be afebrile with blood pressures 156/63-186/63 and all other vital signs maintained.  Labs from 9/13 significant for hemoglobin 10.2 g/dL (prior baseline appears to be from 11-12 g/dL), BUN 10, creatinine 1.52, glucose reported as low as 67, albumin 2.7.  TRH called to admit.   Assessment & Plan:   Principal Problem:   GI bleed Active Problems:   Hypertension   Hard of hearing   Diabetes ([place])   CKD (chronic kidney disease), stage III   Acute blood loss anemia   History of colon polyps   Acute blood loss anemia likely secondary to GI bleed Patient  reported recent constipation and while utilizing MiraLAX had a large bowel movements with blood in stool.  Also associated abdominal discomfort.  Hemoglobin on presentation 10.2, which is slightly decreased from her normal baseline of 11-12.  Previous colonoscopy 2016 revealed sessile polyp near the rectum which was removed.  Patient denied NSAID use and is not on any blood thinners.  KUB with multiple segments of gas distended colon with a few distal fluid levels likely related to history of laxative use. --[facility] following, appreciate assistance --hgb 10.2>9.5 --GI plans colonoscopy on 05/06/2020 --Clear liquid diet; colon prep this afternoon/evening  --Transfuse for hemoglobin less than 8.0 or active bleeding --Follow hemoglobin closely daily  Essential hypertension:  BP 153/70 this am.  Home medications include hydralazine 25 mg 3 times daily and hydrochlorothiazide 12.5 mg daily. --Holding home hydrochlorothiazide due to complaints of dizziness --Continue hydralazine 25mg  TID  Diabetes mellitus type 2 with hypoglycemia:  On admission patient's blood sugars noted to be as low as 67.  Last hemoglobin A1c was noted to be 8.1 on 03/10/2020.  Home medication regimen includes NPH 45-50 units subcu every morning. --Hypoglycemic protocols --Hold home regimen of NPH  --moderate SSI  --CBGs qAC/HS  Complete heart block s/p PPM:  [employer] dual-chamber PPM placed on 03/09/2020 with Dr. Curt Bears for complete heart block.  --Monitor on telemetry --Outpatient follow-up with cardiology  CKD stage IIIb:  On admission creatinine 1.52 which appears relatively around patient's baseline. --Cr 1.52>1.58 --Follow BMP  daily  Hyperlipidemia:  --Continue Crestor 40 mg p.o. daily  Hard of hearing --Supportive care   DVT prophylaxis: SCDs Code Status: Full code Family Communication: Updated patient extensively at bedside, no family present, updated patient's daughter Oren Bracket via telephone this  afternoon  Disposition Plan:  Status is: Observation  The patient remains OBS appropriate and will d/c before 2 midnights.  Dispo: The patient is from: Home              Anticipated d/c is to: Home              Anticipated d/c date is: 1 day              Patient currently is not medically stable to d/c.   Consultants:   [facility]  Procedures:   Colonoscopy pending for 9/16  Antimicrobials:   None   Subjective: Patient seen and examined bedside, resting comfortably.  Reports dark stool overnight, none since.  Hemoglobin trending down to 9.5 today.  Denies any further abdominal discomfort.  Discussed plan for colonoscopy tomorrow.  No other complaints or concerns at this time.  Denies headache, no dizziness currently, no chest pain, no palpitations, no shortness of breath, no abdominal pain, no weakness, no fatigue, no fever/chills/night sweats, no nausea/vomiting/diarrhea.  No acute events overnight per nursing staff.  Objective: Vitals:   05/04/20 2014 05/04/20 2352 05/05/20 0509 05/05/20 1247  BP:  (!) 145/65 (!) 153/70 139/68  Pulse:  98 86 90  Resp:  _0 Temp:  98.6 F (37 C) 98.5 F (36.9 C) 98.4 F (36.9 C)  TempSrc:  Oral Oral Oral  SpO2: 96% 97% 98% 99%  Weight:      Height:        Intake/Output Summary (Last 24 hours) at 05/05/2020 1322 Last data filed at 05/05/2020 0330 Gross per 24 hour  Intake 240 ml  Output --  Net 240 ml   Filed Weights   05/04/20 0820  Weight: 79.4 kg    Examination:  General exam: Appears calm and comfortable  Respiratory system: Clear to auscultation. Respiratory effort normal.  Oxygenating well on room air Cardiovascular system: S1 & S2 heard, RRR. No JVD, murmurs, rubs, gallops or clicks. No pedal edema. Gastrointestinal system: Abdomen is nondistended, soft and nontender. No organomegaly or masses felt. Normal bowel sounds heard. Central nervous system: Alert and oriented. No focal neurological  deficits. Extremities: Symmetric 5 x 5 power. Skin: No rashes, lesions or ulcers Psychiatry: Judgement and insight appear normal. Mood & affect appropriate.     Data Reviewed: I have personally reviewed following labs and imaging studies  CBC: Recent Labs  Lab 05/03/20 1813 05/04/20 1015 05/05/20 0302  WBC 7.8 7.1 7.2  HGB 10.2* 10.3* 9.5*  HCT 33.4* 33.8* 30.0*  MCV 87.2 87.1 86.7  PLT 349 340 973   Basic Metabolic Panel: Recent Labs  Lab 05/03/20 1813 05/05/20 0302  NA 141 137  K 3.9 4.4  CL 103 103  CO2 24 25  GLUCOSE 75 305*  BUN 10 14  CREATININE 1.52* 1.58*  CALCIUM 9.2 8.5*   GFR: Estimated Creatinine Clearance: 27.9 mL/min (A) (by C-G formula based on SCr of 1.58 mg/dL (H)). Liver Function Tests: Recent Labs  Lab 05/03/20 1813  AST 16  ALT 9  ALKPHOS 61  BILITOT 0.5  PROT 6.4*  ALBUMIN 2.7*   No results for input(s): LIPASE, AMYLASE in the last 168 hours. No results for input(s): AMMONIA in the  last 168 hours. Coagulation Profile: No results for input(s): INR, PROTIME in the last 168 hours. Cardiac Enzymes: No results for input(s): CKTOTAL, CKMB, CKMBINDEX, TROPONINI in the last 168 hours. BNP (last 3 results) No results for input(s): PROBNP in the last 8760 hours. HbA1C: No results for input(s): HGBA1C in the last 72 hours. CBG: Recent Labs  Lab 05/04/20 0007 05/04/20 1719 05/04/20 2136 05/05/20 0624 05/05/20 1108  GLUCAP 163* 215* 211* 235* 193*   Lipid Profile: No results for input(s): CHOL, HDL, LDLCALC, TRIG, CHOLHDL, LDLDIRECT in the last 72 hours. Thyroid Function Tests: No results for input(s): TSH, T4TOTAL, FREET4, T3FREE, THYROIDAB in the last 72 hours. Anemia Panel: No results for input(s): VITAMINB12, FOLATE, FERRITIN, TIBC, IRON, RETICCTPCT in the last 72 hours. Sepsis Labs: No results for input(s): PROCALCITON, LATICACIDVEN in the last 168 hours.  Recent Results (from the past 240 hour(s))  SARS Coronavirus 2 by RT  PCR (hospital order, performed in [facility] hospital lab) Nasopharyngeal Nasopharyngeal Swab     Status: None   Collection Time: 05/04/20 10:15 AM   Specimen: Nasopharyngeal Swab  Result Value Ref Range Status   SARS Coronavirus 2 NEGATIVE NEGATIVE Final    Comment: (NOTE) SARS-CoV-2 target nucleic acids are NOT DETECTED.  The SARS-CoV-2 RNA is generally detectable in upper and lower respiratory specimens during the acute phase of infection. The lowest concentration of SARS-CoV-2 viral copies this assay can detect is 250 copies / mL. A negative result does not preclude SARS-CoV-2 infection and should not be used as the sole basis for treatment or other patient management decisions.  A negative result may occur with improper specimen collection / handling, submission of specimen other than nasopharyngeal swab, presence of viral mutation(s) within the areas targeted by this assay, and inadequate number of viral copies (<250 copies / mL). A negative result must be combined with clinical observations, patient history, and epidemiological information.  Fact Sheet for Patients:   StrictlyIdeas.no  Fact Sheet for Healthcare Providers: BankingDealers.co.za  This test is not yet approved or  cleared by the [place] FDA and has been authorized for detection and/or diagnosis of SARS-CoV-2 by FDA under an Emergency Use Authorization (EUA).  This EUA will remain in effect (meaning this test can be used) for the duration of the COVID-19 declaration under Section 564(b)(1) of the Act, 21 U.S.C. section 360bbb-3(b)(1), unless the authorization is terminated or revoked sooner.  Performed at [facility], [place] [address], [place], [place] [address]          Radiology Studies: DG ABD ACUTE 2+V W 1V CHEST  Result Date: 05/04/2020 CLINICAL DATA:  Blood in stool. EXAM: X-RAY ABDOMEN 2 VIEW COMPARISON:  Abdomen and pelvis CT 03/04/2014  FINDINGS: A few left colonic fluid levels are noted. No obstructive pattern or detected fold thickening. Streaky opacity at the lung bases which was also seen July 2021. Chronic cardiomegaly. Dual-chamber pacer leads from the left. Negative for effusion or pneumothorax. IMPRESSION: 1. Multiple segments of gas distended colon with a few distal fluid levels, likely related to history of laxative use. 2. Pulmonary scarring. Electronically Signed   By: Monte Fantasia M.D.   On: 05/04/2020 11:41        Scheduled Meds: . albuterol  2.5 mg Nebulization BID  . allopurinol  300 mg Oral BID  . hydrALAZINE  25 mg Oral Q8H  . hydrocortisone  25 mg Rectal BID  . insulin aspart  0-15 Units Subcutaneous TID WC  .  metoCLOPramide (REGLAN) injection  10 mg Intravenous Once   Followed by  . metoCLOPramide (REGLAN) injection  10 mg Intravenous Once  . peg 3350 powder  0.5 kit Oral Once   And  . peg 3350 powder  0.5 kit Oral Once  . [START ON 05/07/2020] polyethylene glycol  17 g Oral Daily  . rosuvastatin  40 mg Oral Daily  . sodium chloride flush  3 mL Intravenous Q12H   Continuous Infusions:   LOS: 1 day    Time spent: 37 minutes spent on chart review, discussion with nursing staff, consultants, updating family and interview/physical exam; more than 50% of that time was spent in counseling and/or coordination of care.    Sayed Apostol J [place] DO [facility] Available via Epic secure chat 7am-7pm After these hours, please refer to coverage provider listed on [email or web address] 05/05/2020, 1:22 PM

## 2020-05-06 ENCOUNTER — Observation Stay (HOSPITAL_COMMUNITY): Payer: Medicare Other | Admitting: Certified Registered"

## 2020-05-06 ENCOUNTER — Encounter (HOSPITAL_COMMUNITY)
Admission: EM | Disposition: A | Discharge status: Home / Self Care | Payer: Self-pay | Source: Home / Self Care | Attending: Emergency Medicine

## 2020-05-06 ENCOUNTER — Encounter (HOSPITAL_COMMUNITY): Payer: Self-pay | Admitting: Internal Medicine

## 2020-05-06 DIAGNOSIS — D123 Benign neoplasm of transverse colon: Secondary | ICD-10-CM | POA: Diagnosis present

## 2020-05-06 DIAGNOSIS — Z86010 Personal history of colonic polyps: Secondary | ICD-10-CM | POA: Diagnosis not present

## 2020-05-06 DIAGNOSIS — K625 Hemorrhage of anus and rectum: Secondary | ICD-10-CM

## 2020-05-06 DIAGNOSIS — K635 Polyp of colon: Secondary | ICD-10-CM | POA: Diagnosis not present

## 2020-05-06 DIAGNOSIS — D122 Benign neoplasm of ascending colon: Secondary | ICD-10-CM | POA: Diagnosis not present

## 2020-05-06 DIAGNOSIS — K922 Gastrointestinal hemorrhage, unspecified: Secondary | ICD-10-CM | POA: Diagnosis not present

## 2020-05-06 DIAGNOSIS — K644 Residual hemorrhoidal skin tags: Secondary | ICD-10-CM | POA: Diagnosis not present

## 2020-05-06 DIAGNOSIS — D124 Benign neoplasm of descending colon: Secondary | ICD-10-CM

## 2020-05-06 DIAGNOSIS — D62 Acute posthemorrhagic anemia: Secondary | ICD-10-CM | POA: Diagnosis not present

## 2020-05-06 DIAGNOSIS — D500 Iron deficiency anemia secondary to blood loss (chronic): Secondary | ICD-10-CM | POA: Diagnosis not present

## 2020-05-06 DIAGNOSIS — Z20822 Contact with and (suspected) exposure to covid-19: Secondary | ICD-10-CM | POA: Diagnosis not present

## 2020-05-06 DIAGNOSIS — J45909 Unspecified asthma, uncomplicated: Secondary | ICD-10-CM | POA: Diagnosis not present

## 2020-05-06 DIAGNOSIS — K5730 Diverticulosis of large intestine without perforation or abscess without bleeding: Secondary | ICD-10-CM | POA: Diagnosis not present

## 2020-05-06 DIAGNOSIS — D125 Benign neoplasm of sigmoid colon: Secondary | ICD-10-CM | POA: Diagnosis present

## 2020-05-06 HISTORY — PX: POLYPECTOMY: SHX5525

## 2020-05-06 HISTORY — PX: COLONOSCOPY WITH PROPOFOL: SHX5780

## 2020-05-06 LAB — GLUCOSE, CAPILLARY
Glucose-Capillary: 171 mg/dL — ABNORMAL HIGH (ref 70–99)
Glucose-Capillary: 153 mg/dL — ABNORMAL HIGH (ref 70–99)
Glucose-Capillary: 161 mg/dL — ABNORMAL HIGH (ref 70–99)
Glucose-Capillary: 217 mg/dL — ABNORMAL HIGH (ref 70–99)

## 2020-05-06 LAB — BASIC METABOLIC PANEL
Anion gap: 12 (ref 5–15)
BUN: 12 mg/dL (ref 8–23)
CO2: 23 mmol/L (ref 22–32)
Calcium: 9.1 mg/dL (ref 8.9–10.3)
Chloride: 107 mmol/L (ref 98–111)
Creatinine, Ser: 1.53 mg/dL — ABNORMAL HIGH (ref 0.44–1.00)
GFR calc Af Amer: 37 mL/min — ABNORMAL LOW (ref >60)
GFR calc non Af Amer: 32 mL/min — ABNORMAL LOW (ref >60)
Glucose, Bld: 215 mg/dL — ABNORMAL HIGH (ref 70–99)
Potassium: 3.4 mmol/L — ABNORMAL LOW (ref 3.5–5.1)
Sodium: 142 mmol/L (ref 135–145)

## 2020-05-06 LAB — CBC
HCT: 33 % — ABNORMAL LOW (ref 36.0–46.0)
Hemoglobin: 10.3 g/dL — ABNORMAL LOW (ref 12.0–15.0)
MCH: 27.2 pg (ref 26.0–34.0)
MCHC: 31.2 g/dL (ref 30.0–36.0)
MCV: 87.1 fL (ref 80.0–100.0)
Platelets: 348 10*3/uL (ref 150–400)
RBC: 3.79 MIL/uL — ABNORMAL LOW (ref 3.87–5.11)
RDW: 14.4 % (ref 11.5–15.5)
WBC: 7.2 10*3/uL (ref 4.0–10.5)
nRBC: 0 % (ref 0.0–0.2)

## 2020-05-06 SURGERY — COLONOSCOPY WITH PROPOFOL
Anesthesia: Monitor Anesthesia Care

## 2020-05-06 MED ORDER — SODIUM CHLORIDE 0.9 % IV SOLN: INTRAVENOUS | Status: DC | PRN

## 2020-05-06 MED ORDER — LACTATED RINGERS IV SOLN: INTRAVENOUS | Status: DC

## 2020-05-06 MED ORDER — PROPOFOL 500 MG/50ML IV EMUL
INTRAVENOUS | Status: DC | PRN
  Administered 2020-05-06: 75 ug/kg/min via INTRAVENOUS

## 2020-05-06 MED ORDER — PHENYLEPHRINE HCL (PRESSORS) 10 MG/ML IV SOLN
INTRAVENOUS | Status: DC | PRN
  Administered 2020-05-06 (×2): 40 ug via INTRAVENOUS

## 2020-05-06 SURGICAL SUPPLY — 22 items

## 2020-05-06 NOTE — Discharge Summary (Signed)
Physician Discharge Summary  Katana Berthold LKJ:[id number] DOB: Oct 27, 1940 DOA: 05/03/2020  PCP: Biagio Borg, MD  Admit date: 05/03/2020 Discharge date: 05/06/2020  Admitted From: Home Disposition: Home  Recommendations for Outpatient Follow-up:  1. Follow up with PCP in 1-2 weeks 2. Recommend follow-up appointment with GYN, Dr. Purvis Kilts for evaluation of history of endometrial thickening as cause of her anemia 3. Please obtain BMP/CBC in one week 4. Please follow up on the following pending results: Pathology from biopsy during colonoscopy  Home Health: No Equipment/Devices: None  Discharge Condition: Stable CODE STATUS: Full code Diet recommendation: Heart healthy/consistent carbohydrate diet  History of present illness:  Gail Lynch is a 79 y.o.femalewith medical history significant ofhypertension, hyperlipidemia, diabetes mellitus type 2, CHBs/ppermanent pacemaker, adenomatous polyps, andCKD stage IIIpresents after having rectal bleeding yesterday. History is somewhat limited due to patient being hard of hearing. Patient notes that she has been constipated over the last 3 days and had taken a laxative yesterday, but may have taken too much. Thereafter, she had several bowel movements and noted that she had blood present. She thinks there may have been clots of blood present, but she is not sure. Patient also reported having blood present in her bed when she got up. Noted associated symptoms of lower abdominal pain and lightheadedness that have now resolved. Denies having any loss of consciousness, recent falls, NSAID use, or blood thinners. Since she arrived to the ER she had one other bowel movement with blood present, but her abdominal pain has resolved.  Last colonoscopy was in 2016 where patient had a sessile polyp removed from the rectum and a 1 cm lipoma in the right colon.Prior to that patient had a colon where 3 polyps were removed back in 2013.  Upon  admission into the emergency department patient was seen to be afebrile with blood pressures 156/63-186/63 and all other vital signs maintained. Labs from 9/13 significant for hemoglobin 10.2 g/dL (prior baseline appears to be from 11-12 g/dL), BUN 10, creatinine 1.52, glucose reported as low as 67, albumin 2.7.TRH called to admit.  Hospital course:  Acute blood loss anemia likely secondaryto GI bleed Patient reported recent constipation and while utilizing MiraLAX had a large bowel movements with blood in stool.  Also associated abdominal discomfort.  Hemoglobin on presentation 10.2, which is slightly decreased from her normal baseline of 11-12.  Previous colonoscopy 2016 revealed sessile polyp near the rectum which was removed. Patient denied NSAID use and is not on any blood thinners.  KUB with multiple segments of gas distended colon with a few distal fluid levels likely related to history of laxative use. [place] [facility] was consulted and followed during the hospital course.  Patient underwent colonoscopy on 05/06/2020 with findings of internal hemorrhoids, external skin tag, and 5 3-5 mm polyps within the sigmoid, descending, transverse and descending colon which were removed by cold snare.  No active bleeding noted.  GI recommends outpatient follow-up with GYN, Dr. Ihor Dow for history of endometrial thickening as possible source of her anemia, otherwise no other recommendations and okay for discharge home.  Recommend repeat CBC in next PCP/specialist visit.  Essential hypertension:  Continue home medications include hydralazine 25 mg 3 times daily andhydrochlorothiazide 12.5 mg daily.  Diabetes mellitus type 2:  Hemoglobin A1c was noted to be 8.1 on 03/10/2020. Home medication regimen includes NPH 45-50 unitssubcu every morning.  Outpatient follow-up with PCP.  Complete heart blocks/p PPM:  Medtronic dual-chamber PPMplaced on 03/09/2020 with Dr. Curt Bears for complete  heart  block.Outpatient follow-up with cardiology  CKD stage IIIb:  On admission creatinine 1.52 which appears relatively around patient's baseline.  Recommend repeat BMP at next PCP/specialist visit  Hyperlipidemia:  Continue Crestor 40 mg p.o. daily  Hard of hearing   Discharge Diagnoses:  Active Problems:   Hypertension   Hard of hearing   Diabetes ([place])   CKD (chronic kidney disease), stage III   History of colon polyps   Benign neoplasm of ascending colon   Benign neoplasm of transverse colon   Benign neoplasm of descending colon   Benign neoplasm of sigmoid colon    Discharge Instructions  Discharge Instructions    Call MD for:  difficulty breathing, headache or visual disturbances   Complete by: As directed    Call MD for:  extreme fatigue   Complete by: As directed    Call MD for:  persistant dizziness or light-headedness   Complete by: As directed    Call MD for:  persistant nausea and vomiting   Complete by: As directed    Call MD for:  severe uncontrolled pain   Complete by: As directed    Call MD for:  temperature >100.4   Complete by: As directed    Diet - low sodium heart healthy   Complete by: As directed    Increase activity slowly   Complete by: As directed      Allergies as of 05/06/2020      Reactions   Aspirin Other (See Comments)   BLEEDING BEHIND EYES      Medication List    TAKE these medications   allopurinol 300 MG tablet Commonly known as: ZYLOPRIM Take 1 tablet (300 mg total) by mouth 2 (two) times daily.   glucose blood test strip Commonly known as: ONE TOUCH ULTRA TEST 1 each by Other route 2 (two) times daily. And lancets 2/day 250.01   HumuLIN N KwikPen 100 UNIT/ML Kiwkpen Generic drug: Insulin NPH (Human) (Isophane) Inject 55 Units into the skin every morning. What changed:   how much to take  when to take this   hydrALAZINE 25 MG tablet Commonly known as: APRESOLINE Take 1 tablet (25 mg total) by mouth every  8 (eight) hours.   hydrochlorothiazide 12.5 MG capsule Commonly known as: Microzide Take 1 capsule (12.5 mg total) by mouth daily.   rosuvastatin 40 MG tablet Commonly known as: Crestor Take 1 tablet (40 mg total) by mouth daily.       Follow-up Information    Biagio Borg, MD. Schedule an appointment as soon as possible for a visit in 1 week(s).   Specialties: Internal Medicine, Radiology Contact information: [place] [address] [phone]        Lavonia Drafts, MD. Schedule an appointment as soon as possible for a visit in 2 week(s).   Specialty: Obstetrics and Gynecology Contact information: [address] [place] [address] [phone]              Allergies  Allergen Reactions  . Aspirin Other (See Comments)    BLEEDING BEHIND EYES    Consultations:  [facility] [facility]   Procedures/Studies: DG ABD ACUTE 2+V W 1V CHEST  Result Date: 05/04/2020 CLINICAL DATA:  Blood in stool. EXAM: X-RAY ABDOMEN 2 VIEW COMPARISON:  Abdomen and pelvis CT 03/04/2014 FINDINGS: A few left colonic fluid levels are noted. No obstructive pattern or detected fold thickening. Streaky opacity at the lung bases which was also seen July 2021. Chronic cardiomegaly.  Dual-chamber pacer leads from the left. Negative for effusion or pneumothorax. IMPRESSION: 1. Multiple segments of gas distended colon with a few distal fluid levels, likely related to history of laxative use. 2. Pulmonary scarring. Electronically Signed   By: Monte Fantasia M.D.   On: 05/04/2020 11:41      Subjective: Patient seen and examined at bedside after returning from colonoscopy this morning.  No complaints.  Discussed with her and her daughter via telephone findings of colonoscopy to include 5 polyps that were removed and internal hemorrhoids, without any source of bleeding.  Discussed need for follow-up with GYN to evaluate history of endometrial thickening as  possible source of anemia.  No other complaints or concerns at this time.  Denies headache, no fever/chills/night sweats, no nausea/vomiting/diarrhea, no chest pain, palpitations, no abdominal pain, no weakness, no fatigue.  No acute events overnight per nursing staff.  Discharge Exam: Vitals:   05/06/20 1020 05/06/20 1029  BP: (!) 125/42 (!) 143/54  Pulse: 98 98  Resp: 20 16  Temp:    SpO2: 99% 100%   Vitals:   05/06/20 0824 05/06/20 1010 05/06/20 1020 05/06/20 1029  BP: (!) 189/78 (!) 117/42 (!) 125/42 (!) 143/54  Pulse:  77 98 98  Resp:  19 20 16   Temp:  97.7 F (36.5 C)    TempSrc:  Axillary    SpO2:  100% 99% 100%  Weight:      Height:        General: Pt is alert, awake, not in acute distress Cardiovascular: RRR, S1/S2 +, no rubs, no gallops Respiratory: CTA bilaterally, no wheezing, no rhonchi Abdominal: Soft, NT, ND, bowel sounds + Extremities: no edema, no cyanosis    The results of significant diagnostics from this hospitalization (including imaging, microbiology, ancillary and laboratory) are listed below for reference.     Microbiology: Recent Results (from the past 240 hour(s))  SARS Coronavirus 2 by RT PCR (hospital order, performed in [facility] hospital lab) Nasopharyngeal Nasopharyngeal Swab     Status: None   Collection Time: 05/04/20 10:15 AM   Specimen: Nasopharyngeal Swab  Result Value Ref Range Status   SARS Coronavirus 2 NEGATIVE NEGATIVE Final    Comment: (NOTE) SARS-CoV-2 target nucleic acids are NOT DETECTED.  The SARS-CoV-2 RNA is generally detectable in upper and lower respiratory specimens during the acute phase of infection. The lowest concentration of SARS-CoV-2 viral copies this assay can detect is 250 copies / mL. A negative result does not preclude SARS-CoV-2 infection and should not be used as the sole basis for treatment or other patient management decisions.  A negative result may occur with improper specimen collection /  handling, submission of specimen other than nasopharyngeal swab, presence of viral mutation(s) within the areas targeted by this assay, and inadequate number of viral copies (<250 copies / mL). A negative result must be combined with clinical observations, patient history, and epidemiological information.  Fact Sheet for Patients:   StrictlyIdeas.no  Fact Sheet for Healthcare Providers: BankingDealers.co.za  This test is not yet approved or  cleared by the [place] FDA and has been authorized for detection and/or diagnosis of SARS-CoV-2 by FDA under an Emergency Use Authorization (EUA).  This EUA will remain in effect (meaning this test can be used) for the duration of the COVID-19 declaration under Section 564(b)(1) of the Act, 21 U.S.C. section 360bbb-3(b)(1), unless the authorization is terminated or revoked sooner.  Performed at [facility], [place] [address] [place], [place]  [address]      Labs: BNP (last 3 results) No results for input(s): BNP in the last 8760 hours. Basic Metabolic Panel: Recent Labs  Lab 05/03/20 1813 05/05/20 0302  NA 141 137  K 3.9 4.4  CL 103 103  CO2 24 25  GLUCOSE 75 305*  BUN 10 14  CREATININE 1.52* 1.58*  CALCIUM 9.2 8.5*   Liver Function Tests: Recent Labs  Lab 05/03/20 1813  AST 16  ALT 9  ALKPHOS 61  BILITOT 0.5  PROT 6.4*  ALBUMIN 2.7*   No results for input(s): LIPASE, AMYLASE in the last 168 hours. No results for input(s): AMMONIA in the last 168 hours. CBC: Recent Labs  Lab 05/03/20 1813 05/04/20 1015 05/05/20 0302  WBC 7.8 7.1 7.2  HGB 10.2* 10.3* 9.5*  HCT 33.4* 33.8* 30.0*  MCV 87.2 87.1 86.7  PLT 349 340 305   Cardiac Enzymes: No results for input(s): CKTOTAL, CKMB, CKMBINDEX, TROPONINI in the last 168 hours. BNP: Invalid input(s): POCBNP CBG: Recent Labs  Lab 05/05/20 1108 05/05/20 1611 05/05/20 2137 05/06/20 0622 05/06/20 0826  GLUCAP 193*  237* 81 171* 153*   D-Dimer No results for input(s): DDIMER in the last 72 hours. Hgb A1c No results for input(s): HGBA1C in the last 72 hours. Lipid Profile No results for input(s): CHOL, HDL, LDLCALC, TRIG, CHOLHDL, LDLDIRECT in the last 72 hours. Thyroid function studies No results for input(s): TSH, T4TOTAL, T3FREE, THYROIDAB in the last 72 hours.  Invalid input(s): FREET3 Anemia work up No results for input(s): VITAMINB12, FOLATE, FERRITIN, TIBC, IRON, RETICCTPCT in the last 72 hours. Urinalysis    Component Value Date/Time   COLORURINE YELLOW 12/11/2017 0619   APPEARANCEUR CLOUDY (A) 12/11/2017 0619   LABSPEC 1.012 12/11/2017 0619   PHURINE 5.0 12/11/2017 0619   GLUCOSEU 150 (A) 12/11/2017 0619   GLUCOSEU NEGATIVE 11/21/2017 1309   HGBUR LARGE (A) 12/11/2017 0619   BILIRUBINUR NEGATIVE 12/11/2017 0619   KETONESUR NEGATIVE 12/11/2017 0619   PROTEINUR NEGATIVE 12/11/2017 0619   UROBILINOGEN 0.2 11/21/2017 1309   NITRITE NEGATIVE 12/11/2017 0619   LEUKOCYTESUR LARGE (A) 12/11/2017 0619   Sepsis Labs Invalid input(s): PROCALCITONIN,  WBC,  LACTICIDVEN Microbiology Recent Results (from the past 240 hour(s))  SARS Coronavirus 2 by RT PCR (hospital order, performed in [facility] hospital lab) Nasopharyngeal Nasopharyngeal Swab     Status: None   Collection Time: 05/04/20 10:15 AM   Specimen: Nasopharyngeal Swab  Result Value Ref Range Status   SARS Coronavirus 2 NEGATIVE NEGATIVE Final    Comment: (NOTE) SARS-CoV-2 target nucleic acids are NOT DETECTED.  The SARS-CoV-2 RNA is generally detectable in upper and lower respiratory specimens during the acute phase of infection. The lowest concentration of SARS-CoV-2 viral copies this assay can detect is 250 copies / mL. A negative result does not preclude SARS-CoV-2 infection and should not be used as the sole basis for treatment or other patient management decisions.  A negative result may occur with improper specimen  collection / handling, submission of specimen other than nasopharyngeal swab, presence of viral mutation(s) within the areas targeted by this assay, and inadequate number of viral copies (<250 copies / mL). A negative result must be combined with clinical observations, patient history, and epidemiological information.  Fact Sheet for Patients:   StrictlyIdeas.no  Fact Sheet for Healthcare Providers: BankingDealers.co.za  This test is not yet approved or  cleared by the [place] FDA and has been authorized for detection and/or diagnosis of SARS-CoV-2  by FDA under an Emergency Use Authorization (EUA).  This EUA will remain in effect (meaning this test can be used) for the duration of the COVID-19 declaration under Section 564(b)(1) of the Act, 21 U.S.C. section 360bbb-3(b)(1), unless the authorization is terminated or revoked sooner.  Performed at [facility], [place] [address], [place],  [address]      Time coordinating discharge: Over 30 minutes  SIGNED:   Elani Delph J [place] DO  [facility] 05/06/2020, 11:16 AM

## 2020-05-06 NOTE — Anesthesia Preprocedure Evaluation (Addendum)
Anesthesia Evaluation  Patient identified by MRN, date of birth, ID band Patient awake    Reviewed: Allergy & Precautions, NPO status , Patient's Chart, lab work & pertinent test results  History of Anesthesia Complications (+) PONV  Airway Mallampati: I  TM Distance: >3 FB Neck ROM: Full    Dental  (+) Upper Dentures, Lower Dentures, Edentulous Upper, Edentulous Lower   Pulmonary former smoker,  05/04/2020 SARS coronavirus NEG   breath sounds clear to auscultation       Cardiovascular hypertension, Pt. on medications (-) angina+ pacemaker + Valvular Problems/Murmurs AS  Rhythm:Regular Rate:Normal  02/2020 ECHO: EF 60-65%, The mitral valve is degenerative, with trivial MR. The aortic valve is tricuspid. Mild aortic valve stenosis. Aortic valve area, by VTI measures 1.79 cm. AV mean gradient 4.6 mmHg.    Neuro/Psych glaucoma    GI/Hepatic Neg liver ROS, Rectal bleeding   Endo/Other  diabetes (glu 150), Insulin DependentMorbid obesity  Renal/GU Renal InsufficiencyRenal disease (creat 1.58)     Musculoskeletal   Abdominal (+) + obese,   Peds  Hematology  (+) Blood dyscrasia (Hb 9.5), anemia ,   Anesthesia Other Findings   Reproductive/Obstetrics                            Anesthesia Physical Anesthesia Plan  ASA: III  Anesthesia Plan: MAC   Post-op Pain Management:    Induction:   PONV Risk Score and Plan: 3 and Treatment may vary due to age or medical condition  Airway Management Planned: Natural Airway and Simple Face Mask  Additional Equipment: None  Intra-op Plan:   Post-operative Plan:   Informed Consent: I have reviewed the patients History and Physical, chart, labs and discussed the procedure including the risks, benefits and alternatives for the proposed anesthesia with the patient or authorized representative who has indicated his/her understanding and acceptance.        Plan Discussed with: CRNA and Surgeon  Anesthesia Plan Comments:        Anesthesia Quick Evaluation

## 2020-05-06 NOTE — Op Note (Signed)
[facility] Patient Name: Gail Lynch Procedure Date : 05/06/2020 MRN: [id number] Attending MD: Jerene Bears , MD Date of Birth: 03-Mar-1941 CSN: [id number] Age: 79 Admit Type: Inpatient Procedure:                Colonoscopy Indications:              Rectal bleeding, drift downward in hemoglobin Providers:                Lajuan Lines. Hilarie Fredrickson, MD, Benetta Spar RN, RN, Ladona Ridgel, Technician, Theodoro Grist, CRNA Referring MD:             [facility] Medicines:                Monitored Anesthesia Care Complications:            No immediate complications. Estimated Blood Loss:     Estimated blood loss was minimal. Procedure:                Pre-Anesthesia Assessment:                           - Prior to the procedure, a History and Physical                            was performed, and patient medications and                            allergies were reviewed. The patient's tolerance of                            previous anesthesia was also reviewed. The risks                            and benefits of the procedure and the sedation                            options and risks were discussed with the patient.                            All questions were answered, and informed consent                            was obtained. Prior Anticoagulants: The patient has                            taken no previous anticoagulant or antiplatelet                            agents. ASA Grade Assessment: III - A patient with                            severe systemic disease. After reviewing the risks  and benefits, the patient was deemed in                            satisfactory condition to undergo the procedure.                           After obtaining informed consent, the colonoscope                            was passed under direct vision. Throughout the                            procedure, the patient's blood  pressure, pulse, and                            oxygen saturations were monitored continuously. The                            PCF-H190DL ([id number] Olympus pediatric colonoscope                            was introduced through the anus and advanced to the                            terminal ileum. The colonoscopy was performed                            without difficulty. The patient tolerated the                            procedure well. The quality of the bowel                            preparation was excellent. The terminal ileum,                            ileocecal valve, appendiceal orifice, and rectum                            were photographed. Scope In: 9:35:31 AM Scope Out: 9:59:36 AM Scope Withdrawal Time: 0 hours 17 minutes 29 seconds  Total Procedure Duration: 0 hours 24 minutes 5 seconds  Findings:      Skin tags and small non-bleeding hemorrhoids were found on perianal       exam. Of note on positioning the patient to left lateral there was       evidence of small drops of red blood on the bed pad. Etiology unable to       be determined as the hemorrhoids were not bleeding and no blood was       found in the rectum.      The terminal ileum appeared normal.      Five sessile polyps were found in the sigmoid colon (2), descending       colon (1), transverse colon (1) and ascending colon (1). The polyps were       3 to 5  mm in size. These polyps were removed with a cold snare.       Resection and retrieval were complete.      A few small-mouthed diverticula were found in the sigmoid colon.      No additional abnormalities were found on retroflexion (other than what       is described above). Impression:               - Perianal skin tag and very small internal                            hemorrhoids found on perianal exam. Drops of blood                            visible on the bed pad, but no blood seen from                            hemorrhoids or throughout the  examined colon.                           - The examined portion of the ileum was normal.                           - Five 3 to 5 mm polyps in the sigmoid colon, in                            the descending colon, in the transverse colon and                            in the ascending colon, removed with a cold snare.                            Resected and retrieved.                           - Diverticulosis in the sigmoid colon. Moderate Sedation:      N/A Recommendation:           - Return patient to hospital ward for ongoing care.                           - Resume previous diet.                           - Continue present medications.                           - Await pathology results.                           - No repeat colonoscopy due to age.                           - Given her history of thickened endometrial  stripe, prior uterine bleeding, and lack of                            convincing evidence that current bleeding is GI in                            etiology, outpatient GYN evaluation is recommended.                            She has previously been seen and managed by Dr.                            Ihor Dow.                           - GI will sign off, but remain available. Please                            call if questions. Procedure Code(s):        --- Professional ---                           [phone], Colonoscopy, flexible; with removal of                            tumor(s), polyp(s), or other lesion(s) by snare                            technique Diagnosis Code(s):        --- Professional ---                           K63.5, Polyp of colon                           K64.4, Residual hemorrhoidal skin tags                           K62.5, Hemorrhage of anus and rectum                           K57.30, Diverticulosis of large intestine without                            perforation or abscess without bleeding CPT copyright  2019 American Medical Association. All rights reserved. The codes documented in this report are preliminary and upon coder review may  be revised to meet current compliance requirements. Jerene Bears, MD 05/06/2020 10:16:19 AM This report has been signed electronically. Number of Addenda: 0

## 2020-05-06 NOTE — Transfer of Care (Signed)
Immediate Anesthesia Transfer of Care Note  Patient: Gail Lynch  Procedure(s) Performed: COLONOSCOPY WITH PROPOFOL (N/A ) POLYPECTOMY  Patient Location: Endoscopy Unit  Anesthesia Type:MAC  Level of Consciousness: sedated  Airway & Oxygen Therapy: Patient connected to nasal cannula oxygen  Post-op Assessment: Post -op Vital signs reviewed and stable  Post vital signs: stable  Last Vitals:  Vitals Value Taken Time  BP    Temp    Pulse    Resp    SpO2      Last Pain:  Vitals:   05/06/20 0819  TempSrc: Oral  PainSc: 0-No pain         Complications: No complications documented.

## 2020-05-06 NOTE — Discharge Instructions (Signed)

## 2020-05-06 NOTE — Interval H&P Note (Signed)
History and Physical Interval Note: Recent rectal bleeding and change in bowel habits.  Hx of colon polyps.   Slow downtrending hgb For colonoscopy this am with MAC The nature of the procedure, as well as the risks, benefits, and alternatives were carefully and thoroughly reviewed with the patient. Ample time for discussion and questions allowed. The patient understood, was satisfied, and agreed to proceed.   CBC Latest Ref Rng & Units 05/05/2020 05/04/2020 05/03/2020  WBC 4.0 - 10.5 K/uL 7.2 7.1 7.8  Hemoglobin 12.0 - 15.0 g/dL 9.5(L) 10.3(L) 10.2(L)  Hematocrit 36 - 46 % 30.0(L) 33.8(L) 33.4(L)  Platelets 150 - 400 K/uL 305 340 349   CMP     Component Value Date/Time   NA 137 05/05/2020 0302   K 4.4 05/05/2020 0302   CL 103 05/05/2020 0302   CO2 25 05/05/2020 0302   GLUCOSE 305 (H) 05/05/2020 0302   BUN 14 05/05/2020 0302   CREATININE 1.58 (H) 05/05/2020 0302   CALCIUM 8.5 (L) 05/05/2020 0302   PROT 6.4 (L) 05/03/2020 1813   ALBUMIN 2.7 (L) 05/03/2020 1813   AST 16 05/03/2020 1813   ALT 9 05/03/2020 1813   ALKPHOS 61 05/03/2020 1813   BILITOT 0.5 05/03/2020 1813   GFRNONAA 30 (L) 05/05/2020 0302   GFRAA 35 (L) 05/05/2020 0302     05/06/2020 9:00 AM  [name]  has presented today for surgery, with the diagnosis of rectal bleeding.  constipation.  The various methods of treatment have been discussed with the patient and family. After consideration of risks, benefits and other options for treatment, the patient has consented to  Procedure(s): COLONOSCOPY WITH PROPOFOL (N/A) as a surgical intervention.  The patient's history has been reviewed, patient examined, no change in status, stable for surgery.  I have reviewed the patient's chart and labs.  Questions were answered to the patient's satisfaction.     Lajuan Lines Trayven Lumadue

## 2020-05-06 NOTE — Anesthesia Procedure Notes (Signed)
Procedure Name: MAC Date/Time: 05/06/2020 9:36 AM Performed by: Lavell Luster, CRNA Pre-anesthesia Checklist: Patient identified, Emergency Drugs available, Suction available, Patient being monitored and Timeout performed Patient Re-evaluated:Patient Re-evaluated prior to induction Oxygen Delivery Method: Nasal cannula Preoxygenation: Pre-oxygenation with 100% oxygen Induction Type: IV induction Placement Confirmation: breath sounds checked- equal and bilateral and positive ETCO2

## 2020-05-06 NOTE — Anesthesia Postprocedure Evaluation (Addendum)
Anesthesia Post Note  Patient: Gail Lynch  Procedure(s) Performed: COLONOSCOPY WITH PROPOFOL (N/A ) POLYPECTOMY     Patient location during evaluation: Endoscopy Anesthesia Type: MAC Level of consciousness: awake and alert, patient cooperative and oriented Pain management: pain level controlled Vital Signs Assessment: post-procedure vital signs reviewed and stable Respiratory status: spontaneous breathing, nonlabored ventilation and respiratory function stable Cardiovascular status: blood pressure returned to baseline and stable Postop Assessment: no apparent nausea or vomiting Anesthetic complications: no   No complications documented.  Last Vitals:  Vitals:   05/06/20 1029 05/06/20 1114  BP: (!) 143/54 (!) 157/78  Pulse: 98 98  Resp: 16   Temp:  36.6 C  SpO2: 100%     Last Pain:  Vitals:   05/06/20 1114  TempSrc: Oral  PainSc:                  [name],E. Shamya Macfadden

## 2020-05-07 ENCOUNTER — Encounter (HOSPITAL_COMMUNITY): Payer: Self-pay | Admitting: Internal Medicine

## 2020-05-07 LAB — SURGICAL PATHOLOGY

## 2020-05-10 ENCOUNTER — Encounter: Payer: Self-pay | Admitting: Internal Medicine

## 2020-05-13 DIAGNOSIS — Z950 Presence of cardiac pacemaker: Secondary | ICD-10-CM | POA: Diagnosis not present

## 2020-05-13 DIAGNOSIS — E11311 Type 2 diabetes mellitus with unspecified diabetic retinopathy with macular edema: Secondary | ICD-10-CM | POA: Diagnosis not present

## 2020-05-13 DIAGNOSIS — Z794 Long term (current) use of insulin: Secondary | ICD-10-CM | POA: Diagnosis not present

## 2020-05-13 DIAGNOSIS — Z48812 Encounter for surgical aftercare following surgery on the circulatory system: Secondary | ICD-10-CM | POA: Diagnosis not present

## 2020-05-13 DIAGNOSIS — I442 Atrioventricular block, complete: Secondary | ICD-10-CM | POA: Diagnosis not present

## 2020-05-13 DIAGNOSIS — R600 Localized edema: Secondary | ICD-10-CM | POA: Diagnosis not present

## 2020-06-10 ENCOUNTER — Ambulatory Visit (INDEPENDENT_AMBULATORY_CARE_PROVIDER_SITE_OTHER): Payer: Medicare Other

## 2020-06-10 DIAGNOSIS — I442 Atrioventricular block, complete: Secondary | ICD-10-CM

## 2020-06-10 LAB — CUP PACEART REMOTE DEVICE CHECK
Battery Remaining Longevity: 97 mo
Battery Voltage: 3.16 V
Brady Statistic AP VP Percent: 23.11 %
Brady Statistic AP VS Percent: 0 %
Brady Statistic AS VP Percent: 76.69 %
Brady Statistic AS VS Percent: 0.21 %
Brady Statistic RA Percent Paced: 22.74 %
Brady Statistic RV Percent Paced: 99.79 %
Date Time Interrogation Session: 20211020194820
Implantable Lead Implant Date: 20210720
Implantable Lead Implant Date: 20210720
Implantable Lead Location: 753859
Implantable Lead Location: 753860
Implantable Lead Model: 5076
Implantable Lead Model: 5076
Implantable Pulse Generator Implant Date: 20210720
Lead Channel Impedance Value: 266 Ohm
Lead Channel Impedance Value: 285 Ohm
Lead Channel Impedance Value: 342 Ohm
Lead Channel Impedance Value: 342 Ohm
Lead Channel Pacing Threshold Amplitude: 0.625 V
Lead Channel Pacing Threshold Amplitude: 0.625 V
Lead Channel Pacing Threshold Pulse Width: 0.4 ms
Lead Channel Pacing Threshold Pulse Width: 0.4 ms
Lead Channel Sensing Intrinsic Amplitude: 1.125 mV
Lead Channel Sensing Intrinsic Amplitude: 1.125 mV
Lead Channel Sensing Intrinsic Amplitude: 3.625 mV
Lead Channel Sensing Intrinsic Amplitude: 3.625 mV
Lead Channel Setting Pacing Amplitude: 3.25 V
Lead Channel Setting Pacing Amplitude: 3.25 V
Lead Channel Setting Pacing Pulse Width: 0.4 ms
Lead Channel Setting Sensing Sensitivity: 1.2 mV

## 2020-06-15 ENCOUNTER — Other Ambulatory Visit: Payer: Self-pay

## 2020-06-15 ENCOUNTER — Ambulatory Visit (INDEPENDENT_AMBULATORY_CARE_PROVIDER_SITE_OTHER): Payer: Medicare Other | Admitting: Cardiology

## 2020-06-15 ENCOUNTER — Encounter: Payer: Self-pay | Admitting: Cardiology

## 2020-06-15 VITALS — BP 136/62 | HR 72 | Ht 63.0 in | Wt 188.0 lb

## 2020-06-15 DIAGNOSIS — I442 Atrioventricular block, complete: Secondary | ICD-10-CM | POA: Diagnosis not present

## 2020-06-15 NOTE — Progress Notes (Signed)
Electrophysiology Office Note   Date:  06/15/2020   ID:  Gail Lynch, DOB Jan 15, 1941, MRN [id number]  PCP:  Biagio Borg, MD  Cardiologist:  Primary Electrophysiologist:  Annise Boran Meredith Leeds, MD    Chief Complaint: Pacemaker   History of Present Illness: Gail Lynch is a 79 y.o. female who is being seen today for the evaluation of pacemaker at the request of Biagio Borg, MD. Presenting today for electrophysiology evaluation.  She has a history significant for type 2 diabetes, hypertension, hyperlipidemia.  She presented to the hospital after being seen at her primary physician's office in complete heart block.  She is now status post Medtronic dual-chamber pacemaker implanted 03/09/2020.  Today, she denies symptoms of palpitations, chest pain, shortness of breath, orthopnea, PND, lower extremity edema, claudication, dizziness, presyncope, syncope, bleeding, or neurologic sequela. The patient is tolerating medications without difficulties. Since her device was implanted she has done well.  She has no chest pain or shortness of breath.  She is able to do all her daily activities without restriction.  She states that she has quite a bit more energy and is able to do more exertion.   Past Medical History:  Diagnosis Date  . Acute lower GI bleeding 05/03/2020  . Arthritis    gout in feet  . Asthma    hx as a child - no inhaler, no problems  . Carotid stenosis 06/18/2012  . Colon polyps   . Diabetes mellitus    Type 2  . Dysplasia of cervix, low grade (CIN 1)    s/p cervical conization  . Glaucoma   . Glaucoma 12/18/2011  . Hard of hearing    hear aids - left  . HPV (human papilloma virus) infection   . Hyperlipidemia   . Hypertension   . Intrinsic asthma, unspecified 12/18/2011  . Mild aortic stenosis 03/14/2017  . Mycotic toenails 02/19/2013  . PONV (postoperative nausea and vomiting)   . SVD (spontaneous vaginal delivery)    x 4   Past Surgical History:  Procedure  Laterality Date  . BREAST BIOPSY  2010  . BREAST BIOPSY  1991  . COLONOSCOPY    . COLONOSCOPY WITH PROPOFOL N/A 05/06/2020   Procedure: COLONOSCOPY WITH PROPOFOL;  Surgeon: Jerene Bears, MD;  Location: [facility] ENDOSCOPY;  Service: Endoscopy;  Laterality: N/A;  . DILATION AND CURETTAGE OF UTERUS  2000  . HYSTEROSCOPY WITH D & C N/A 03/04/2014   Procedure: DILATATION AND CURETTAGE /HYSTEROSCOPY;  Surgeon: Lavonia Drafts, MD;  Location: [facility];  Service: Gynecology;  Laterality: N/A;  . KNEE SURGERY  2010   patella fracture  . MULTIPLE TOOTH EXTRACTIONS    . PACEMAKER IMPLANT N/A 03/09/2020   Procedure: PACEMAKER IMPLANT;  Surgeon: Constance Haw, MD;  Location: [place] [facility];  Service: Cardiovascular;  Laterality: N/A;  . POLYPECTOMY    . POLYPECTOMY  05/06/2020   Procedure: POLYPECTOMY;  Surgeon: Jerene Bears, MD;  Location: [facility] ENDOSCOPY;  Service: Endoscopy;;     Current Outpatient Medications  Medication Sig Dispense Refill  . allopurinol (ZYLOPRIM) 300 MG tablet Take 1 tablet (300 mg total) by mouth 2 (two) times daily. 90 tablet 3  . glucose blood (ONE TOUCH ULTRA TEST) test strip 1 each by Other route 2 (two) times daily. And lancets 2/day 250.01 180 each 3  . hydrALAZINE (APRESOLINE) 25 MG tablet Take 1 tablet (25 mg total) by mouth every 8 (eight) hours. 90 tablet 3  . hydrochlorothiazide (MICROZIDE)  12.5 MG capsule Take 1 capsule (12.5 mg total) by mouth daily. 90 capsule 3  . Insulin NPH, Human,, Isophane, (HUMULIN N KWIKPEN) 100 UNIT/ML Kiwkpen Inject 55 Units into the skin every morning. (Patient taking differently: Inject 45-50 Units into the skin daily. ) 10 pen 11  . rosuvastatin (CRESTOR) 40 MG tablet Take 1 tablet (40 mg total) by mouth daily. 90 tablet 3   No current facility-administered medications for this visit.    Allergies:   Aspirin   Social History:  The patient  reports that she quit smoking about 39 years ago. Her smoking use included  cigarettes. She has quit using smokeless tobacco. She reports that she does not drink alcohol and does not use drugs.   Family History:  The patient's family history includes Asthma in an other family member; Diabetes in her brother; Heart disease in her brother, father, and mother.    ROS:  Please see the history of present illness.   Otherwise, review of systems is positive for none.   All other systems are reviewed and negative.    PHYSICAL EXAM: VS:  BP 136/62   Pulse 72   Ht 5\' 3"  (1.6 m)   Wt 188 lb (85.3 kg)   SpO2 98%   BMI 33.30 kg/m  , BMI Body mass index is 33.3 kg/m. GEN: Well nourished, well developed, in no acute distress  HEENT: normal  Neck: no JVD, carotid bruits, or masses Cardiac: RRR; no murmurs, rubs, or gallops,no edema  Respiratory:  clear to auscultation bilaterally, normal work of breathing GI: soft, nontender, nondistended, + BS MS: no deformity or atrophy  Skin: warm and dry, device pocket is well healed Neuro:  Strength and sensation are intact Psych: euthymic mood, full affect  EKG:  EKG is ordered today. Personal review of the ekg ordered shows sinus rhythm with intermittent atrial pacing, ventricular paced  Device interrogation is reviewed today in detail.  See PaceArt for details.   Recent Labs: 05/03/2020: ALT 9 05/06/2020: BUN 12; Creatinine, Ser 1.53; Hemoglobin 10.3; Platelets 348; Potassium 3.4; Sodium 142    Lipid Panel     Component Value Date/Time   CHOL 221 (H) 09/17/2018 1054   TRIG 132.0 09/17/2018 1054   HDL 34.70 (L) 09/17/2018 1054   CHOLHDL 6 09/17/2018 1054   VLDL 26.4 09/17/2018 1054   LDLCALC 160 (H) 09/17/2018 1054   LDLDIRECT 152.9 12/18/2011 1405     Wt Readings from Last 3 Encounters:  06/15/20 188 lb (85.3 kg)  05/04/20 175 lb (79.4 kg)  03/18/20 190 lb (86.2 kg)      Other studies Reviewed: Additional studies/ records that were reviewed today include: TTE 03/09/2020 Review of the above records today  demonstrates:  1. Left ventricular ejection fraction, by estimation, is 60 to 65%. The  left ventricle has normal function. Left ventricular diastolic function  could not be evaluated.  2. Right ventricular systolic function is normal. The right ventricular  size is normal. Tricuspid regurgitation signal is inadequate for assessing  PA pressure.  3. Left atrial size was mild to moderately dilated.  4. The mitral valve is degenerative. Trivial mitral valve regurgitation.  No evidence of mitral stenosis.  5. The aortic valve is tricuspid. Aortic valve regurgitation is not  visualized. Mild aortic valve stenosis. Aortic valve area, by VTI measures  1.79 cm. Aortic valve mean gradient measures 4.6 mmHg. Aortic valve Vmax  measures 1.59 m/s.  6. The inferior vena cava is dilated in size  with >50% respiratory  variability, suggesting right atrial pressure of 8 mmHg.    ASSESSMENT AND PLAN:  1.  Complete heart block: Status post Medtronic dual-chamber pacemaker implanted 03/09/2020.  Device functioning appropriately.  No changes at this time.  2.  Hypertension: Currently well controlled   Current medicines are reviewed at length with the patient today.   The patient does not have concerns regarding her medicines.  The following changes were made today:  none  Labs/ tests ordered today include:  Orders Placed This Encounter  Procedures  . EKG 12-Lead     Disposition:   FU with Rayson Rando 9 months  Signed, Jonathon Tan Meredith Leeds, MD  06/15/2020 3:22 PM     [address] [place] [address] [phone] (office) [phone] (fax)

## 2020-06-15 NOTE — Patient Instructions (Signed)
Medication Instructions:  Your physician recommends that you continue on your current medications as directed. Please refer to the Current Medication list given to you today.  *If you need a refill on your cardiac medications before your next appointment, please call your pharmacy*   Lab Work: None ordered If you have labs (blood work) drawn today and your tests are completely normal, you will receive your results only by: Marland Kitchen MyChart Message (if you have MyChart) OR . A paper copy in the mail If you have any lab test that is abnormal or we need to change your treatment, we will call you to review the results.   Testing/Procedures: None ordered   Follow-Up: At [facility], you and your health needs are our priority.  As part of our continuing mission to provide you with exceptional heart care, we have created designated Provider Care Teams.  These Care Teams include your primary Cardiologist (physician) and Advanced Practice Providers (APPs -  Physician Assistants and Nurse Practitioners) who all work together to provide you with the care you need, when you need it.  We recommend signing up for the patient portal called "MyChart".  Sign up information is provided on this After Visit Summary.  MyChart is used to connect with patients for Virtual Visits (Telemedicine).  Patients are able to view lab/test results, encounter notes, upcoming appointments, etc.  Non-urgent messages can be sent to your provider as well.   To learn more about what you can do with MyChart, go to [email or web address].    Remote monitoring is used to monitor your Pacemaker or ICD from home. This monitoring reduces the number of office visits required to check your device to one time per year. It allows [place] to keep an eye on the functioning of your device to ensure it is working properly. You are scheduled for a device check from home on 09/09/2020. You may send your transmission at any time that day. If you have a  wireless device, the transmission will be sent automatically. After your physician reviews your transmission, you will receive a postcard with your next transmission date.  Your next appointment:   9 month(s)  The format for your next appointment:   In Person  Provider:   Allegra Lai, MD   Thank you for choosing [facility]!!   Trinidad Curet, RN [phone]

## 2020-06-16 NOTE — Progress Notes (Signed)
Remote pacemaker transmission.   

## 2020-06-18 ENCOUNTER — Other Ambulatory Visit: Payer: Self-pay | Admitting: Cardiology

## 2020-06-21 ENCOUNTER — Ambulatory Visit (INDEPENDENT_AMBULATORY_CARE_PROVIDER_SITE_OTHER): Payer: Medicare Other | Admitting: Endocrinology

## 2020-06-21 ENCOUNTER — Encounter: Payer: Self-pay | Admitting: Endocrinology

## 2020-06-21 ENCOUNTER — Other Ambulatory Visit: Payer: Self-pay

## 2020-06-21 VITALS — BP 134/80 | HR 104 | Ht 63.0 in | Wt 187.0 lb

## 2020-06-21 DIAGNOSIS — L97911 Non-pressure chronic ulcer of unspecified part of right lower leg limited to breakdown of skin: Secondary | ICD-10-CM

## 2020-06-21 DIAGNOSIS — E11319 Type 2 diabetes mellitus with unspecified diabetic retinopathy without macular edema: Secondary | ICD-10-CM

## 2020-06-21 DIAGNOSIS — L97909 Non-pressure chronic ulcer of unspecified part of unspecified lower leg with unspecified severity: Secondary | ICD-10-CM | POA: Insufficient documentation

## 2020-06-21 DIAGNOSIS — Z794 Long term (current) use of insulin: Secondary | ICD-10-CM | POA: Diagnosis not present

## 2020-06-21 LAB — POCT GLYCOSYLATED HEMOGLOBIN (HGB A1C): Hemoglobin A1C: 9.2 % — AB (ref 4.0–5.6)

## 2020-06-21 MED ORDER — HUMULIN N KWIKPEN 100 UNIT/ML ~~LOC~~ SUPN
50.0000 [IU] | PEN_INJECTOR | SUBCUTANEOUS | 11 refills | Status: DC
Start: 2020-06-21 — End: 2020-08-03

## 2020-06-21 NOTE — Patient Instructions (Addendum)
check your blood sugar twice a day.  vary the time of day when you check, between before the 3 meals, and at bedtime.  also check if you have symptoms of your blood sugar being too high or too low.  please keep a record of the readings and bring it to your next appointment here.  please call us sooner if your blood sugar goes below 70, or if it stays over 200.   Please take 50 units every morning, no matter what your blood sugar is.   On this type of insulin schedule, you should eat meals on a regular schedule (especially lunch).  If a meal is missed or significantly delayed, your blood sugar could go low.  Please see a wound specialist.  you will receive a phone call, about a day and time for an appointment  Please come back for a follow-up appointment in 1 month.

## 2020-06-21 NOTE — Progress Notes (Signed)
Subjective:    Patient ID: Gail Lynch, female    DOB: [birth year], 79 y.o.   MRN: [id number]  HPI Pt returns for f/u of diabetes mellitus: DM type: Insulin-requiring type 2. Dx'ed: 4098 Complications: polyneuropathy, renal insuff, and retinopathy.   Therapy: insulin since soon after dx.    GDM: never. DKA: never Severe hypoglycemia: never.   Pancreatitis: never.   Other: she declines multiple daily injections; she declines to increase frequency of injections; she changed from lantus to NPH, due to pattern of cbg's.    Interval history: no cbg record, but states cbg varies from 69-350. There is no trend throughout the day.  pt states she feels well in general.  She says she never misses the insulin, but she takes 45-50 units, depending on fasting cbg.  She sometimes takes the insulin in the evening.   Past Medical History:  Diagnosis Date  . Acute lower GI bleeding 05/03/2020  . Arthritis    gout in feet  . Asthma    hx as a child - no inhaler, no problems  . Carotid stenosis 06/18/2012  . Colon polyps   . Diabetes mellitus    Type 2  . Dysplasia of cervix, low grade (CIN 1)    s/p cervical conization  . Glaucoma   . Glaucoma 12/18/2011  . Hard of hearing    hear aids - left  . HPV (human papilloma virus) infection   . Hyperlipidemia   . Hypertension   . Intrinsic asthma, unspecified 12/18/2011  . Mild aortic stenosis 03/14/2017  . Mycotic toenails 02/19/2013  . PONV (postoperative nausea and vomiting)   . SVD (spontaneous vaginal delivery)    x 4    Past Surgical History:  Procedure Laterality Date  . BREAST BIOPSY  2010  . BREAST BIOPSY  1991  . COLONOSCOPY    . COLONOSCOPY WITH PROPOFOL N/A 05/06/2020   Procedure: COLONOSCOPY WITH PROPOFOL;  Surgeon: Jerene Bears, MD;  Location: [facility] ENDOSCOPY;  Service: Endoscopy;  Laterality: N/A;  . DILATION AND CURETTAGE OF UTERUS  2000  . HYSTEROSCOPY WITH D & C N/A 03/04/2014   Procedure: DILATATION AND CURETTAGE  /HYSTEROSCOPY;  Surgeon: Lavonia Drafts, MD;  Location: [facility];  Service: Gynecology;  Laterality: N/A;  . KNEE SURGERY  2010   patella fracture  . MULTIPLE TOOTH EXTRACTIONS    . PACEMAKER IMPLANT N/A 03/09/2020   Procedure: PACEMAKER IMPLANT;  Surgeon: Constance Haw, MD;  Location: [place];  Service: Cardiovascular;  Laterality: N/A;  . POLYPECTOMY    . POLYPECTOMY  05/06/2020   Procedure: POLYPECTOMY;  Surgeon: Jerene Bears, MD;  Location: [facility] ENDOSCOPY;  Service: Endoscopy;;    Social History   Socioeconomic History  . Marital status: Widowed    Spouse name: Not on file  . Number of children: 3  . Years of education: Not on file  . Highest education level: Not on file  Occupational History  . Occupation: retired  Tobacco Use  . Smoking status: Former Smoker    Types: Cigarettes    Quit date: 08/21/1980    Years since quitting: 39.8  . Smokeless tobacco: Former Network engineer  . Vaping Use: Never used  Substance and Sexual Activity  . Alcohol use: No    Alcohol/week: 0.0 standard drinks  . Drug use: No  . Sexual activity: Yes    Birth control/protection: Post-menopausal  Other Topics Concern  . Not on file  Social History Narrative  .  Not on file   Social Determinants of Health   Financial Resource Strain:   . Difficulty of Paying Living Expenses: Not on file  Food Insecurity:   . Worried About Charity fundraiser in the Last Year: Not on file  . Ran Out of Food in the Last Year: Not on file  Transportation Needs:   . Lack of Transportation (Medical): Not on file  . Lack of Transportation (Non-Medical): Not on file  Physical Activity:   . Days of Exercise per Week: Not on file  . Minutes of Exercise per Session: Not on file  Stress:   . Feeling of Stress : Not on file  Social Connections:   . Frequency of Communication with Friends and Family: Not on file  . Frequency of Social Gatherings with Friends and Family: Not on file  .  Attends Religious Services: Not on file  . Active Member of Clubs or Organizations: Not on file  . Attends Archivist Meetings: Not on file  . Marital Status: Not on file  Intimate Partner Violence:   . Fear of Current or Ex-Partner: Not on file  . Emotionally Abused: Not on file  . Physically Abused: Not on file  . Sexually Abused: Not on file    Current Outpatient Medications on File Prior to Visit  Medication Sig Dispense Refill  . allopurinol (ZYLOPRIM) 300 MG tablet Take 1 tablet (300 mg total) by mouth 2 (two) times daily. 90 tablet 3  . glucose blood (ONE TOUCH ULTRA TEST) test strip 1 each by Other route 2 (two) times daily. And lancets 2/day 250.01 180 each 3  . hydrALAZINE (APRESOLINE) 25 MG tablet Take 1 tablet (25 mg total) by mouth every 8 (eight) hours. 90 tablet 3  . hydrochlorothiazide (MICROZIDE) 12.5 MG capsule Take 1 capsule (12.5 mg total) by mouth daily. 90 capsule 3  . rosuvastatin (CRESTOR) 40 MG tablet Take 1 tablet (40 mg total) by mouth daily. 90 tablet 3   No current facility-administered medications on file prior to visit.    Allergies  Allergen Reactions  . Aspirin Other (See Comments)    BLEEDING BEHIND EYES    Family History  Problem Relation Age of Onset  . Diabetes Brother   . Heart disease Brother   . Heart disease Mother   . Heart disease Father   . Asthma Other   . Colon cancer Neg Hx   . Rectal cancer Neg Hx   . Stomach cancer Neg Hx   . Esophageal cancer Neg Hx     BP 134/80   Pulse (!) 104   Ht 5\' 3"  (1.6 m)   Wt 187 lb (84.8 kg)   SpO2 97%   BMI 33.13 kg/m    Review of Systems     Objective:   Physical Exam VITAL SIGNS:  See vs page GENERAL: no distress Pulses: dorsalis pedis intact bilat.   MSK: no deformity of the feet CV: 1+ bilat leg edema Skin: several shallow ulcers at the right lower leg.  normal color and temp on the feet.  Neuro: sensation is intact to touch on the feet Ext: there is bilateral  onychomycosis of the toenails.    Lab Results  Component Value Date   HGBA1C 9.2 (A) 06/21/2020   Lab Results  Component Value Date   CREATININE 1.53 (H) 05/06/2020   BUN 12 05/06/2020   NA 142 05/06/2020   K 3.4 (L) 05/06/2020   CL 107 05/06/2020  CO2 23 05/06/2020       Assessment & Plan:  Insulin-requiring type 2 DM, with CRI.  Hypoglycemia, due to insulin: this limits aggressiveness of glycemic control. Noncompliance with cbg recording: I'll work around this as best I can  Patient Instructions  check your blood sugar twice a day.  vary the time of day when you check, between before the 3 meals, and at bedtime.  also check if you have symptoms of your blood sugar being too high or too low.  please keep a record of the readings and bring it to your next appointment here.  please call us sooner if your blood sugar goes below 70, or if it stays over 200.   Please take 50 units every morning, no matter what your blood sugar is.   On this type of insulin schedule, you should eat meals on a regular schedule (especially lunch).  If a meal is missed or significantly delayed, your blood sugar could go low.  Please see a wound specialist.  you will receive a phone call, about a day and time for an appointment  Please come back for a follow-up appointment in 1 month.

## 2020-06-22 ENCOUNTER — Ambulatory Visit: Payer: Medicare Other | Admitting: Endocrinology

## 2020-06-23 ENCOUNTER — Ambulatory Visit: Payer: Medicare Other | Admitting: Podiatry

## 2020-07-26 ENCOUNTER — Telehealth: Payer: Self-pay | Admitting: *Deleted

## 2020-07-26 NOTE — Telephone Encounter (Signed)
Sadric called wanting to if a health assessment could be done on the patient while she is here tomorrow. Sadric states the patient has dysaphia and thinks it is getting worse.     [phone]

## 2020-07-26 NOTE — Telephone Encounter (Signed)
Please advise. Thanks.  

## 2020-07-26 NOTE — Telephone Encounter (Signed)
Called patient spoke to daughter, advised of message from MD.  They will discuss tomorrow during the visit on seeing PCP regarding those issues.  Patient daughter verbalized understanding.

## 2020-07-26 NOTE — Telephone Encounter (Signed)
This is for primary care provider

## 2020-07-27 ENCOUNTER — Ambulatory Visit: Payer: Medicare Other | Admitting: Endocrinology

## 2020-07-29 ENCOUNTER — Encounter: Payer: Self-pay | Admitting: Internal Medicine

## 2020-07-29 ENCOUNTER — Ambulatory Visit (INDEPENDENT_AMBULATORY_CARE_PROVIDER_SITE_OTHER): Payer: Medicare Other | Admitting: Internal Medicine

## 2020-07-29 ENCOUNTER — Other Ambulatory Visit: Payer: Self-pay

## 2020-07-29 VITALS — BP 150/78 | HR 101 | Temp 98.6°F | Ht 63.0 in | Wt 174.0 lb

## 2020-07-29 DIAGNOSIS — E11319 Type 2 diabetes mellitus with unspecified diabetic retinopathy without macular edema: Secondary | ICD-10-CM

## 2020-07-29 DIAGNOSIS — I10 Essential (primary) hypertension: Secondary | ICD-10-CM

## 2020-07-29 DIAGNOSIS — F0390 Unspecified dementia without behavioral disturbance: Secondary | ICD-10-CM | POA: Insufficient documentation

## 2020-07-29 DIAGNOSIS — L97911 Non-pressure chronic ulcer of unspecified part of right lower leg limited to breakdown of skin: Secondary | ICD-10-CM | POA: Diagnosis not present

## 2020-07-29 DIAGNOSIS — Z9119 Patient's noncompliance with other medical treatment and regimen: Secondary | ICD-10-CM

## 2020-07-29 DIAGNOSIS — Z91199 Patient's noncompliance with other medical treatment and regimen due to unspecified reason: Secondary | ICD-10-CM

## 2020-07-29 DIAGNOSIS — F0392 Unspecified dementia, unspecified severity, with psychotic disturbance: Secondary | ICD-10-CM

## 2020-07-29 DIAGNOSIS — E7849 Other hyperlipidemia: Secondary | ICD-10-CM

## 2020-07-29 DIAGNOSIS — L97909 Non-pressure chronic ulcer of unspecified part of unspecified lower leg with unspecified severity: Secondary | ICD-10-CM

## 2020-07-29 DIAGNOSIS — F0391 Unspecified dementia with behavioral disturbance: Secondary | ICD-10-CM

## 2020-07-29 DIAGNOSIS — N1831 Chronic kidney disease, stage 3a: Secondary | ICD-10-CM

## 2020-07-29 DIAGNOSIS — Z794 Long term (current) use of insulin: Secondary | ICD-10-CM

## 2020-07-29 DIAGNOSIS — E11622 Type 2 diabetes mellitus with other skin ulcer: Secondary | ICD-10-CM

## 2020-07-29 LAB — CBC WITH DIFFERENTIAL/PLATELET
Basophils Absolute: 0.1 10*3/uL (ref 0.0–0.1)
Basophils Relative: 0.8 % (ref 0.0–3.0)
Eosinophils Absolute: 0.2 10*3/uL (ref 0.0–0.7)
Eosinophils Relative: 2.2 % (ref 0.0–5.0)
HCT: 35.2 % — ABNORMAL LOW (ref 36.0–46.0)
Hemoglobin: 11.2 g/dL — ABNORMAL LOW (ref 12.0–15.0)
Lymphocytes Relative: 27.6 % (ref 12.0–46.0)
Lymphs Abs: 2 10*3/uL (ref 0.7–4.0)
MCHC: 31.8 g/dL (ref 30.0–36.0)
MCV: 86.4 fl (ref 78.0–100.0)
Monocytes Absolute: 0.7 10*3/uL (ref 0.1–1.0)
Monocytes Relative: 9.7 % (ref 3.0–12.0)
Neutro Abs: 4.4 10*3/uL (ref 1.4–7.7)
Neutrophils Relative %: 59.7 % (ref 43.0–77.0)
Platelets: 234 10*3/uL (ref 150.0–400.0)
RBC: 4.08 Mil/uL (ref 3.87–5.11)
RDW: 16.3 % — ABNORMAL HIGH (ref 11.5–15.5)
WBC: 7.4 10*3/uL (ref 4.0–10.5)

## 2020-07-29 LAB — BASIC METABOLIC PANEL
BUN: 14 mg/dL (ref 6–23)
CO2: 28 mEq/L (ref 19–32)
Calcium: 9.4 mg/dL (ref 8.4–10.5)
Chloride: 103 mEq/L (ref 96–112)
Creatinine, Ser: 1.56 mg/dL — ABNORMAL HIGH (ref 0.40–1.20)
GFR: 30.98 mL/min — ABNORMAL LOW (ref >60.00)
Glucose, Bld: 298 mg/dL — ABNORMAL HIGH (ref 70–99)
Potassium: 3.8 mEq/L (ref 3.5–5.1)
Sodium: 140 mEq/L (ref 135–145)

## 2020-07-29 LAB — URINALYSIS, ROUTINE W REFLEX MICROSCOPIC
Bilirubin Urine: NEGATIVE
Ketones, ur: NEGATIVE
Nitrite: NEGATIVE
Specific Gravity, Urine: 1.015 (ref 1.000–1.030)
Urine Glucose: 100 — AB
Urobilinogen, UA: 0.2 (ref 0.0–1.0)
pH: 6 (ref 5.0–8.0)

## 2020-07-29 LAB — HEPATIC FUNCTION PANEL
ALT: 11 U/L (ref 0–35)
AST: 21 U/L (ref 0–37)
Albumin: 3.8 g/dL (ref 3.5–5.2)
Alkaline Phosphatase: 72 U/L (ref 39–117)
Bilirubin, Direct: 0.1 mg/dL (ref 0.0–0.3)
Total Bilirubin: 0.4 mg/dL (ref 0.2–1.2)
Total Protein: 7 g/dL (ref 6.0–8.3)

## 2020-07-29 LAB — LIPID PANEL
Cholesterol: 222 mg/dL — ABNORMAL HIGH (ref 0–200)
HDL: 48.2 mg/dL (ref >39.00)
LDL Cholesterol: 145 mg/dL — ABNORMAL HIGH (ref 0–99)
NonHDL: 174.25
Total CHOL/HDL Ratio: 5
Triglycerides: 145 mg/dL (ref 0.0–149.0)
VLDL: 29 mg/dL (ref 0.0–40.0)

## 2020-07-29 LAB — VITAMIN B12: Vitamin B-12: 609 pg/mL (ref 211–911)

## 2020-07-29 LAB — TSH: TSH: 2.08 u[IU]/mL (ref 0.35–4.50)

## 2020-07-29 MED ORDER — ALLOPURINOL 300 MG PO TABS
300.0000 mg | ORAL_TABLET | Freq: Two times a day (BID) | ORAL | 3 refills | Status: DC
Start: 2020-07-29 — End: 2021-02-04

## 2020-07-29 MED ORDER — HYDROCHLOROTHIAZIDE 12.5 MG PO CAPS: 12.5000 mg | ORAL_CAPSULE | Freq: Every day | ORAL | 3 refills | Status: DC

## 2020-07-29 MED ORDER — ROSUVASTATIN CALCIUM 40 MG PO TABS
40.0000 mg | ORAL_TABLET | Freq: Every day | ORAL | 3 refills | Status: DC
Start: 2020-07-29 — End: 2021-02-04

## 2020-07-29 MED ORDER — HYDRALAZINE HCL 25 MG PO TABS
25.0000 mg | ORAL_TABLET | Freq: Three times a day (TID) | ORAL | 3 refills | Status: DC
Start: 2020-07-29 — End: 2021-02-04

## 2020-07-29 NOTE — Patient Instructions (Signed)
Please continue all other medications as before, and refills have been done if requested.  Please have the pharmacy call with any other refills you may need.  Please continue your efforts at being more active, low cholesterol diet, and weight control.  Please keep your appointments with your specialists as you may have planned  Please go to the LAB at the blood drawing area for the tests to be done  You will be contacted by phone if any changes need to be made immediately.  Otherwise, you will receive a letter about your results with an explanation, but please check with MyChart first.  Please remember to sign up for MyChart if you have not done so, as this will be important to you in the future with finding out test results, communicating by private email, and scheduling acute appointments online when needed.  Please make an Appointment to return in 1 month

## 2020-07-29 NOTE — Progress Notes (Addendum)
Subjective:    Patient ID: Gail Lynch, female    DOB: [birth year], 79 y.o.   MRN: [id number]  HPI  Pt here after brought by duaghter who states the family is quite concerned about her, they all live more than an hour away and cannot provide immediate support, and believe she needs more medical attention specifically psychiatric, but understand that patient has blocked and resisted and deferred virtually all care since hospn.  Family states she has become increasingly forgetful with memory lapses and behavior that primarily seems paranoid. Pt lives alone, does not take any meds including her insulin, accused a daughter of tampering with her meds when she tried to assist, and has done things such as leaving the doors open to the home for some reason at times.       Pt was last hospd 9/13-9/16 with rectal bleeding, abd pain, and mild acute anemia hgb 10.2.  Seen per GI, had colonoscopy with finding adenoma.  Also recommended for GYN f/u due to finding of endometrial thickening.  a1c was 8.1.  CKD overall stable, and recommended for contd crestor 40qd as well as;  Recommendations for Outpatient Follow-up:  1. Follow up with PCP in 1-2 weeks 2. Recommend follow-up appointment with GYN, Dr. Purvis Kilts for evaluation of history of endometrial thickening as cause of her anemia 3. Please obtain BMP/CBC in one week 4. Please follow up on the following pending results: Pathology from biopsy during colonoscopy  Pt has new problem with a 1 x 2 cm right lower extremity very superficial leg wound about 4 cm above the ankle anteriorly without s/s cellulitis,  Pt today is very suspicious and wary, repeats "I am in my right mind" and "why do people talk around me".when daughter explains the home situation above.  Pt is willing for lab work today, but o/w has no intention she states of follow up with any other recommendations I d/w daughter today including ENT and audiology for hearing los, Neurology and MRI brain  for memory loss, psychiatry for dementia with behavioral abnormal, aricept, f/u with GI, Home health, wound clinic or GYN.  Past Medical History:  Diagnosis Date  . Acute lower GI bleeding 05/03/2020  . Arthritis    gout in feet  . Asthma    hx as a child - no inhaler, no problems  . Carotid stenosis 06/18/2012  . Colon polyps   . Diabetes mellitus    Type 2  . Dysplasia of cervix, low grade (CIN 1)    s/p cervical conization  . Glaucoma   . Glaucoma 12/18/2011  . Hard of hearing    hear aids - left  . HPV (human papilloma virus) infection   . Hyperlipidemia   . Hypertension   . Intrinsic asthma, unspecified 12/18/2011  . Mild aortic stenosis 03/14/2017  . Mycotic toenails 02/19/2013  . PONV (postoperative nausea and vomiting)   . SVD (spontaneous vaginal delivery)    x 4   Past Surgical History:  Procedure Laterality Date  . BREAST BIOPSY  2010  . BREAST BIOPSY  1991  . COLONOSCOPY    . COLONOSCOPY WITH PROPOFOL N/A 05/06/2020   Procedure: COLONOSCOPY WITH PROPOFOL;  Surgeon: Jerene Bears, MD;  Location: [facility] ENDOSCOPY;  Service: Endoscopy;  Laterality: N/A;  . DILATION AND CURETTAGE OF UTERUS  2000  . HYSTEROSCOPY WITH D & C N/A 03/04/2014   Procedure: DILATATION AND CURETTAGE /HYSTEROSCOPY;  Surgeon: Lavonia Drafts, MD;  Location: [place] [facility];  Service: Gynecology;  Laterality: N/A;  . KNEE SURGERY  2010   patella fracture  . MULTIPLE TOOTH EXTRACTIONS    . PACEMAKER IMPLANT N/A 03/09/2020   Procedure: PACEMAKER IMPLANT;  Surgeon: Constance Haw, MD;  Location: [place] [facility];  Service: Cardiovascular;  Laterality: N/A;  . POLYPECTOMY    . POLYPECTOMY  05/06/2020   Procedure: POLYPECTOMY;  Surgeon: Jerene Bears, MD;  Location: [facility] ENDOSCOPY;  Service: Endoscopy;;    reports that she quit smoking about 39 years ago. Her smoking use included cigarettes. She has quit using smokeless tobacco. She reports that she does not drink alcohol and does not use drugs. family  history includes Asthma in an other family member; Diabetes in her brother; Heart disease in her brother, father, and mother. Allergies  Allergen Reactions  . Aspirin Other (See Comments)    BLEEDING BEHIND EYES   Current Outpatient Medications on File Prior to Visit  Medication Sig Dispense Refill  . glucose blood (ONE TOUCH ULTRA TEST) test strip 1 each by Other route 2 (two) times daily. And lancets 2/day 250.01 180 each 3  . Insulin NPH, Human,, Isophane, (HUMULIN N KWIKPEN) 100 UNIT/ML Kiwkpen Inject 50 Units into the skin every morning. 30 mL 11   No current facility-administered medications on file prior to visit.   Review of Systems All otherwise neg per pt     Objective:   Physical Exam BP (!) 150/78 (BP Location: Left Arm, Patient Position: Sitting, Cuff Size: Large)   Pulse (!) 101   Temp 98.6 F (37 C) (Oral)   Ht 5\' 3"  (1.6 m)   Wt 174 lb (78.9 kg)   SpO2 96%   BMI 30.82 kg/m  VS noted,  Constitutional: Pt appears in NAD HENT: Head: NCAT.  Right Ear: External ear normal.  Left Ear: External ear normal.  Eyes: . Pupils are equal, round, and reactive to light. Conjunctivae and EOM are normal Nose: without d/c or deformity Neck: Neck supple. Gross normal ROM Cardiovascular: Normal rate and regular rhythm.   Pulmonary/Chest: Effort normal and breath sounds without rales or wheezing.  Abd:  Soft, NT, ND, + BS, no organomegaly Neurological: Pt is alert. At baseline orientation, motor grossly intact Skin: Skin is warm. No rashes, other new lesions, no LE edema Psychiatric: Pt behavior is normal without agitation  All otherwise neg per pt Lab Results  Component Value Date   WBC 7.4 07/29/2020   HGB 11.2 (L) 07/29/2020   HCT 35.2 (L) 07/29/2020   PLT 234.0 07/29/2020   GLUCOSE 298 (H) 07/29/2020   CHOL 222 (H) 07/29/2020   TRIG 145.0 07/29/2020   HDL 48.20 07/29/2020   LDLDIRECT 152.9 12/18/2011   LDLCALC 145 (H) 07/29/2020   ALT 11 07/29/2020   AST 21  07/29/2020   NA 140 07/29/2020   K 3.8 07/29/2020   CL 103 07/29/2020   CREATININE 1.56 (H) 07/29/2020   BUN 14 07/29/2020   CO2 28 07/29/2020   TSH 2.08 07/29/2020   INR 1.1 (H) 09/08/2013   HGBA1C 9.2 (A) 06/21/2020   MICROALBUR 2.6 (H) 11/21/2017      Assessment & Plan:

## 2020-07-30 ENCOUNTER — Encounter: Payer: Self-pay | Admitting: Internal Medicine

## 2020-07-31 ENCOUNTER — Encounter: Payer: Self-pay | Admitting: Internal Medicine

## 2020-07-31 DIAGNOSIS — Z91199 Patient's noncompliance with other medical treatment and regimen due to unspecified reason: Secondary | ICD-10-CM | POA: Insufficient documentation

## 2020-07-31 NOTE — Assessment & Plan Note (Signed)
Encouraged med compliance

## 2020-07-31 NOTE — Assessment & Plan Note (Signed)
Encourage compliance but refuses all but lab testing today

## 2020-07-31 NOTE — Assessment & Plan Note (Signed)
For a1c with labs, Encouraged med compliance

## 2020-07-31 NOTE — Assessment & Plan Note (Addendum)
Much worse post hospn it seems, pt o/w seems competant enough to refuse care (though this is not clear) and adamantly refuses any attempts at further evaluation or treatment of numerous issues today as detailed in the HPI.  Pt may soon be a danger to herself and possibly others. Will attempt to have social services (adult protective services) address.    I spent 41 minutes in preparing to see the patient by review of recent labs, imaging and procedures, obtaining and reviewing separately obtained history, communicating with the patient and family or caregiver, ordering medications, tests or procedures, and documenting clinical information in the EHR including the differential Dx, treatment, and any further evaluation and other management of dementia, noncompliacne, htn, hld, dm, leg wound, ckd

## 2020-07-31 NOTE — Assessment & Plan Note (Signed)
Refuses wound clinic no s/s celluitis today, pt will continue home dressings as she has been doing

## 2020-07-31 NOTE — Assessment & Plan Note (Addendum)
Refuses wound clinic, For a1c with labs, Encouraged med compliance

## 2020-07-31 NOTE — Assessment & Plan Note (Signed)
For f/u lab today 

## 2020-08-03 ENCOUNTER — Ambulatory Visit (INDEPENDENT_AMBULATORY_CARE_PROVIDER_SITE_OTHER): Payer: Medicare Other | Admitting: Endocrinology

## 2020-08-03 ENCOUNTER — Encounter: Payer: Self-pay | Admitting: Endocrinology

## 2020-08-03 ENCOUNTER — Other Ambulatory Visit: Payer: Self-pay

## 2020-08-03 VITALS — BP 178/90 | HR 92 | Ht 63.0 in | Wt 176.6 lb

## 2020-08-03 DIAGNOSIS — Z794 Long term (current) use of insulin: Secondary | ICD-10-CM | POA: Diagnosis not present

## 2020-08-03 DIAGNOSIS — E11319 Type 2 diabetes mellitus with unspecified diabetic retinopathy without macular edema: Secondary | ICD-10-CM

## 2020-08-03 LAB — POCT GLYCOSYLATED HEMOGLOBIN (HGB A1C): Hemoglobin A1C: 10.4 % — AB (ref 4.0–5.6)

## 2020-08-03 MED ORDER — ONETOUCH VERIO VI STRP: 1.0000 | ORAL_STRIP | Freq: Two times a day (BID) | 12 refills | Status: AC

## 2020-08-03 MED ORDER — HUMULIN N KWIKPEN 100 UNIT/ML ~~LOC~~ SUPN
50.0000 [IU] | PEN_INJECTOR | SUBCUTANEOUS | 11 refills | Status: DC
Start: 2020-08-03 — End: 2022-05-01

## 2020-08-03 NOTE — Patient Instructions (Addendum)
Your blood pressure is high today.  Please see your primary care provider soon, to have it rechecked check your blood sugar twice a day.  vary the time of day when you check, between before the 3 meals, and at bedtime.  also check if you have symptoms of your blood sugar being too high or too low.  please keep a record of the readings and bring it to your next appointment here.  please call us sooner if your blood sugar goes below 70, or if it stays over 200.   Please take 50 units every morning, no matter what your blood sugar is.  I have sent a prescription to your pharmacy, to refill. On this type of insulin schedule, you should eat meals on a regular schedule (especially lunch).  If a meal is missed or significantly delayed, your blood sugar could go low.   Here is a new meter.  I have sent a prescription to your pharmacy, for strips. Please come back for a follow-up appointment in 2 months.

## 2020-08-03 NOTE — Progress Notes (Signed)
Subjective:    Patient ID: Gail Lynch, female    DOB: May 26, 1941, 79 y.o.   MRN: [id number]  HPI Pt returns for f/u of diabetes mellitus:  DM type: Insulin-requiring type 2. Dx'ed: 5397 Complications: polyneuropathy, renal insuff, and retinopathy.   Therapy: insulin since soon after dx.    GDM: never. DKA: never Severe hypoglycemia: never.   Pancreatitis: never.   Other: she declines multiple daily injections; she declines to increase frequency of injections; she changed from lantus to NPH, due to pattern of cbg's.    Interval history: Pt says she has been out of her insulin.  She has not recently taken any of her meds.  She also does not have a monitor.   Past Medical History:  Diagnosis Date  . Acute lower GI bleeding 05/03/2020  . Arthritis    gout in feet  . Asthma    hx as a child - no inhaler, no problems  . Carotid stenosis 06/18/2012  . Colon polyps   . Diabetes mellitus    Type 2  . Dysplasia of cervix, low grade (CIN 1)    s/p cervical conization  . Glaucoma   . Glaucoma 12/18/2011  . Hard of hearing    hear aids - left  . HPV (human papilloma virus) infection   . Hyperlipidemia   . Hypertension   . Intrinsic asthma, unspecified 12/18/2011  . Mild aortic stenosis 03/14/2017  . Mycotic toenails 02/19/2013  . PONV (postoperative nausea and vomiting)   . SVD (spontaneous vaginal delivery)    x 4    Past Surgical History:  Procedure Laterality Date  . BREAST BIOPSY  2010  . BREAST BIOPSY  1991  . COLONOSCOPY    . COLONOSCOPY WITH PROPOFOL N/A 05/06/2020   Procedure: COLONOSCOPY WITH PROPOFOL;  Surgeon: Jerene Bears, MD;  Location[facility] ENDOSCOPY;  Service: Endoscopy;  Laterality: N/A;  . DILATION AND CURETTAGE OF UTERUS  2000  . HYSTEROSCOPY WITH D & C N/A 03/04/2014   Procedure: DILATATION AND CURETTAGE /HYSTEROSCOPY;  Surgeon: Lavonia Drafts, MD;  Location: [place] [facility];  Service: Gynecology;  Laterality: N/A;  . KNEE SURGERY  2010   patella fracture  .  MULTIPLE TOOTH EXTRACTIONS    . PACEMAKER IMPLANT N/A 03/09/2020   Procedure: PACEMAKER IMPLANT;  Surgeon: Constance Haw, MD;  Location: [place] [facility];  Service: Cardiovascular;  Laterality: N/A;  . POLYPECTOMY    . POLYPECTOMY  05/06/2020   Procedure: POLYPECTOMY;  Surgeon: Jerene Bears, MD;  Location: [facility] ENDOSCOPY;  Service: Endoscopy;;    Social History   Socioeconomic History  . Marital status: Widowed    Spouse name: Not on file  . Number of children: 3  . Years of education: Not on file  . Highest education level: Not on file  Occupational History  . Occupation: retired  Tobacco Use  . Smoking status: Former Smoker    Types: Cigarettes    Quit date: 08/21/1980    Years since quitting: 39.9  . Smokeless tobacco: Former Network engineer  . Vaping Use: Never used  Substance and Sexual Activity  . Alcohol use: No    Alcohol/week: 0.0 standard drinks  . Drug use: No  . Sexual activity: Yes    Birth control/protection: Post-menopausal  Other Topics Concern  . Not on file  Social History Narrative  . Not on file   Social Determinants of Health   Financial Resource Strain: Not on file  Food Insecurity:  Not on file  Transportation Needs: Not on file  Physical Activity: Not on file  Stress: Not on file  Social Connections: Not on file  Intimate Partner Violence: Not on file    Current Outpatient Medications on File Prior to Visit  Medication Sig Dispense Refill  . allopurinol (ZYLOPRIM) 300 MG tablet Take 1 tablet (300 mg total) by mouth 2 (two) times daily. 90 tablet 3  . hydrALAZINE (APRESOLINE) 25 MG tablet Take 1 tablet (25 mg total) by mouth every 8 (eight) hours. 90 tablet 3  . hydrochlorothiazide (MICROZIDE) 12.5 MG capsule Take 1 capsule (12.5 mg total) by mouth daily. 90 capsule 3  . rosuvastatin (CRESTOR) 40 MG tablet Take 1 tablet (40 mg total) by mouth daily. 90 tablet 3   No current facility-administered medications on file prior to visit.     Allergies  Allergen Reactions  . Aspirin Other (See Comments)    BLEEDING BEHIND EYES    Family History  Problem Relation Age of Onset  . Diabetes Brother   . Heart disease Brother   . Heart disease Mother   . Heart disease Father   . Asthma Other   . Colon cancer Neg Hx   . Rectal cancer Neg Hx   . Stomach cancer Neg Hx   . Esophageal cancer Neg Hx     BP (!) 178/90   Pulse 92   Ht 5\' 3"  (1.6 m)   Wt 176 lb 9.6 oz (80.1 kg)   SpO2 98%   BMI 31.28 kg/m   Review of Systems Denies n/v/sob    Objective:   Physical Exam VITAL SIGNS:  See vs page GENERAL: no distress.    Lab Results  Component Value Date   HGBA1C 10.4 (A) 08/03/2020       Assessment & Plan:  Insulin-requiring type 2 DM, with CRI: uncontrolled Noncompliance with insulin. We discussed need to take HTN: is noted today   Patient Instructions  Your blood pressure is high today.  Please see your primary care provider soon, to have it rechecked check your blood sugar twice a day.  vary the time of day when you check, between before the 3 meals, and at bedtime.  also check if you have symptoms of your blood sugar being too high or too low.  please keep a record of the readings and bring it to your next appointment here.  please call us sooner if your blood sugar goes below 70, or if it stays over 200.   Please take 50 units every morning, no matter what your blood sugar is.  I have sent a prescription to your pharmacy, to refill. On this type of insulin schedule, you should eat meals on a regular schedule (especially lunch).  If a meal is missed or significantly delayed, your blood sugar could go low.   Here is a new meter.  I have sent a prescription to your pharmacy, for strips. Please come back for a follow-up appointment in 2 months.

## 2020-08-23 ENCOUNTER — Telehealth: Payer: Self-pay | Admitting: Endocrinology

## 2020-08-23 NOTE — Telephone Encounter (Signed)
Pt daughter--requesting help for the pt for home health care --not taking medication properly and had the referral cancel pt did not wanted to go.

## 2020-08-23 NOTE — Telephone Encounter (Signed)
Pt refused at last visit  Ok to contact pt to ask if we can send [facility] to see her, thanks

## 2020-08-23 NOTE — Telephone Encounter (Signed)
Patient's daughter Kenney Houseman requests to be called at ph# [phone] re: Patient has not been taking her medication as she should and discuss possibility of in Home care/help for Patient

## 2020-08-24 NOTE — Telephone Encounter (Signed)
Spoke with pts daughter and explained to her that her mother would have to contact the office and state she now agrees to [facility] services in order for any orders to be sent in for her.

## 2020-09-01 ENCOUNTER — Ambulatory Visit: Payer: Medicare Other | Admitting: Internal Medicine

## 2020-09-03 ENCOUNTER — Ambulatory Visit (INDEPENDENT_AMBULATORY_CARE_PROVIDER_SITE_OTHER): Payer: Medicare Other | Admitting: Podiatry

## 2020-09-03 ENCOUNTER — Encounter: Payer: Self-pay | Admitting: Podiatry

## 2020-09-03 ENCOUNTER — Other Ambulatory Visit: Payer: Self-pay

## 2020-09-03 DIAGNOSIS — B351 Tinea unguium: Secondary | ICD-10-CM | POA: Diagnosis not present

## 2020-09-03 DIAGNOSIS — E1151 Type 2 diabetes mellitus with diabetic peripheral angiopathy without gangrene: Secondary | ICD-10-CM

## 2020-09-03 DIAGNOSIS — Q6650 Congenital pes planus, unspecified foot: Secondary | ICD-10-CM | POA: Diagnosis not present

## 2020-09-03 DIAGNOSIS — M79676 Pain in unspecified toe(s): Secondary | ICD-10-CM | POA: Diagnosis not present

## 2020-09-03 NOTE — Progress Notes (Signed)
This patient returns to my office for at risk foot care.  This patient requires this care by a professional since this patient will be at risk due to having diabetes with neuropathy and CKD-3.  This patient is unable to cut nails herself since the patient cannot reach her nails.These nails are painful walking and wearing shoes.  This patient presents for at risk foot care today. Patient has not been seen in over 7 months.  General Appearance  Alert, conversant and in no acute stress.  Vascular  Dorsalis pedis and posterior tibial  pulses are not  palpable  bilaterally.  Capillary return is within normal limits  bilaterally. Temperature is within normal limits  bilaterally.  Neurologic  Senn-Weinstein monofilament wire test within normal limits  bilaterally. Muscle power within normal limits bilaterally.  Nails Thick disfigured discolored nails with subungual debris  from hallux to fifth toes bilaterally. No evidence of bacterial infection or drainage bilaterally.  Orthopedic  No limitations of motion  feet .  No crepitus or effusions noted.  No bony pathology or digital deformities noted.  Skin  normotropic skin with no porokeratosis noted bilaterally.  No signs of infections or ulcers noted.   Asymptomatic callus right heel.  Onychomycosis  Pain in right toes  Pain in left toes  Consent was obtained for treatment procedures.   Mechanical debridement of nails 1-5  bilaterally performed with a nail nipper.  Filed with dremel without incident. No infection or ulcer.     Return office visit   3 months                  Told patient to return for periodic foot care and evaluation due to potential at risk complications.   Gardiner Barefoot DPM

## 2020-09-09 ENCOUNTER — Ambulatory Visit (INDEPENDENT_AMBULATORY_CARE_PROVIDER_SITE_OTHER): Payer: Medicare Other

## 2020-09-09 DIAGNOSIS — I442 Atrioventricular block, complete: Secondary | ICD-10-CM

## 2020-09-09 LAB — CUP PACEART REMOTE DEVICE CHECK
Battery Remaining Longevity: 124 mo
Battery Voltage: 3.11 V
Brady Statistic AP VP Percent: 12.21 %
Brady Statistic AP VS Percent: 0 %
Brady Statistic AS VP Percent: 87.75 %
Brady Statistic AS VS Percent: 0.04 %
Brady Statistic RA Percent Paced: 11.99 %
Brady Statistic RV Percent Paced: 99.96 %
Date Time Interrogation Session: 20220120042553
Implantable Lead Implant Date: 20210720
Implantable Lead Implant Date: 20210720
Implantable Lead Location: 753859
Implantable Lead Location: 753860
Implantable Lead Model: 5076
Implantable Lead Model: 5076
Implantable Pulse Generator Implant Date: 20210720
Lead Channel Impedance Value: 285 Ohm
Lead Channel Impedance Value: 285 Ohm
Lead Channel Impedance Value: 361 Ohm
Lead Channel Impedance Value: 399 Ohm
Lead Channel Pacing Threshold Amplitude: 0.5 V
Lead Channel Pacing Threshold Amplitude: 0.625 V
Lead Channel Pacing Threshold Pulse Width: 0.4 ms
Lead Channel Pacing Threshold Pulse Width: 0.4 ms
Lead Channel Sensing Intrinsic Amplitude: 1.125 mV
Lead Channel Sensing Intrinsic Amplitude: 1.125 mV
Lead Channel Sensing Intrinsic Amplitude: 2 mV
Lead Channel Sensing Intrinsic Amplitude: 3.625 mV
Lead Channel Setting Pacing Amplitude: 1.5 V
Lead Channel Setting Pacing Amplitude: 2.5 V
Lead Channel Setting Pacing Pulse Width: 0.4 ms
Lead Channel Setting Sensing Sensitivity: 1.2 mV

## 2020-09-21 ENCOUNTER — Ambulatory Visit: Payer: Medicare Other | Admitting: Internal Medicine

## 2020-09-21 NOTE — Progress Notes (Signed)
Remote pacemaker transmission.   

## 2020-10-05 ENCOUNTER — Ambulatory Visit: Payer: Medicare Other | Admitting: Endocrinology

## 2020-12-09 ENCOUNTER — Ambulatory Visit (INDEPENDENT_AMBULATORY_CARE_PROVIDER_SITE_OTHER): Payer: Medicare Other

## 2020-12-09 DIAGNOSIS — I442 Atrioventricular block, complete: Secondary | ICD-10-CM

## 2020-12-10 ENCOUNTER — Ambulatory Visit: Payer: Medicare Other | Admitting: Podiatry

## 2020-12-10 LAB — CUP PACEART REMOTE DEVICE CHECK
Battery Remaining Longevity: 117 mo
Battery Voltage: 3.05 V
Brady Statistic AP VP Percent: 11 %
Brady Statistic AP VS Percent: 0 %
Brady Statistic AS VP Percent: 88.99 %
Brady Statistic AS VS Percent: 0.01 %
Brady Statistic RA Percent Paced: 10.8 %
Brady Statistic RV Percent Paced: 99.99 %
Date Time Interrogation Session: 20220421020700
Implantable Lead Implant Date: 20210720
Implantable Lead Implant Date: 20210720
Implantable Lead Location: 753859
Implantable Lead Location: 753860
Implantable Lead Model: 5076
Implantable Lead Model: 5076
Implantable Pulse Generator Implant Date: 20210720
Lead Channel Impedance Value: 285 Ohm
Lead Channel Impedance Value: 304 Ohm
Lead Channel Impedance Value: 342 Ohm
Lead Channel Impedance Value: 380 Ohm
Lead Channel Pacing Threshold Amplitude: 0.625 V
Lead Channel Pacing Threshold Amplitude: 0.625 V
Lead Channel Pacing Threshold Pulse Width: 0.4 ms
Lead Channel Pacing Threshold Pulse Width: 0.4 ms
Lead Channel Sensing Intrinsic Amplitude: 1.375 mV
Lead Channel Sensing Intrinsic Amplitude: 1.375 mV
Lead Channel Sensing Intrinsic Amplitude: 2 mV
Lead Channel Sensing Intrinsic Amplitude: 3.625 mV
Lead Channel Setting Pacing Amplitude: 1.5 V
Lead Channel Setting Pacing Amplitude: 2.5 V
Lead Channel Setting Pacing Pulse Width: 0.4 ms
Lead Channel Setting Sensing Sensitivity: 1.2 mV

## 2020-12-27 NOTE — Progress Notes (Signed)
Remote pacemaker transmission.   

## 2021-01-10 ENCOUNTER — Ambulatory Visit: Payer: Medicare Other | Admitting: Podiatry

## 2021-01-19 ENCOUNTER — Encounter: Payer: Self-pay | Admitting: Podiatry

## 2021-01-19 ENCOUNTER — Other Ambulatory Visit: Payer: Self-pay

## 2021-01-19 ENCOUNTER — Ambulatory Visit (INDEPENDENT_AMBULATORY_CARE_PROVIDER_SITE_OTHER): Payer: Medicare Other | Admitting: Podiatry

## 2021-01-19 DIAGNOSIS — B351 Tinea unguium: Secondary | ICD-10-CM | POA: Diagnosis not present

## 2021-01-19 DIAGNOSIS — M79676 Pain in unspecified toe(s): Secondary | ICD-10-CM

## 2021-01-19 DIAGNOSIS — Q6650 Congenital pes planus, unspecified foot: Secondary | ICD-10-CM

## 2021-01-19 DIAGNOSIS — E1151 Type 2 diabetes mellitus with diabetic peripheral angiopathy without gangrene: Secondary | ICD-10-CM

## 2021-01-19 NOTE — Progress Notes (Signed)
This patient returns to my office for at risk foot care.  This patient requires this care by a professional since this patient will be at risk due to having diabetes with neuropathy and CKD-3.  This patient is unable to cut nails herself since the patient cannot reach her nails.These nails are painful walking and wearing shoes.  This patient presents for at risk foot care today. Patient has not been seen in over 7 months.  General Appearance  Alert, conversant and in no acute stress.  Vascular  Dorsalis pedis and posterior tibial  pulses are not  palpable  bilaterally.  Capillary return is within normal limits  bilaterally. Temperature is within normal limits  bilaterally.  Neurologic  Senn-Weinstein monofilament wire test within normal limits  bilaterally. Muscle power within normal limits bilaterally.  Nails Thick disfigured discolored nails with subungual debris  from hallux to fifth toes bilaterally. No evidence of bacterial infection or drainage bilaterally.  Orthopedic  No limitations of motion  feet .  No crepitus or effusions noted.  No bony pathology or digital deformities noted.  Skin  normotropic skin with no porokeratosis noted bilaterally.  No signs of infections or ulcers noted.   Asymptomatic callus right heel.  Onychomycosis  Pain in right toes  Pain in left toes  Consent was obtained for treatment procedures.   Mechanical debridement of nails 1-5  bilaterally performed with a nail nipper.  Filed with dremel without incident. No infection or ulcer.     Return office visit   3 months                  Told patient to return for periodic foot care and evaluation due to potential at risk complications.   Gardiner Barefoot DPM

## 2021-01-21 ENCOUNTER — Encounter (HOSPITAL_COMMUNITY): Payer: Self-pay | Admitting: Pharmacy Technician

## 2021-01-21 ENCOUNTER — Emergency Department (HOSPITAL_COMMUNITY)
Admission: EM | Admit: 2021-01-21 | Discharge: 2021-01-24 | Disposition: A | Discharge status: Home / Self Care | Payer: Medicare Other | Attending: Emergency Medicine | Admitting: Emergency Medicine

## 2021-01-21 ENCOUNTER — Emergency Department (HOSPITAL_COMMUNITY): Payer: Medicare Other

## 2021-01-21 DIAGNOSIS — N183 Chronic kidney disease, stage 3 unspecified: Secondary | ICD-10-CM | POA: Diagnosis not present

## 2021-01-21 DIAGNOSIS — Z20822 Contact with and (suspected) exposure to covid-19: Secondary | ICD-10-CM | POA: Diagnosis not present

## 2021-01-21 DIAGNOSIS — Z87891 Personal history of nicotine dependence: Secondary | ICD-10-CM | POA: Diagnosis not present

## 2021-01-21 DIAGNOSIS — Z794 Long term (current) use of insulin: Secondary | ICD-10-CM | POA: Insufficient documentation

## 2021-01-21 DIAGNOSIS — I129 Hypertensive chronic kidney disease with stage 1 through stage 4 chronic kidney disease, or unspecified chronic kidney disease: Secondary | ICD-10-CM | POA: Diagnosis not present

## 2021-01-21 DIAGNOSIS — Z950 Presence of cardiac pacemaker: Secondary | ICD-10-CM | POA: Diagnosis not present

## 2021-01-21 DIAGNOSIS — J45909 Unspecified asthma, uncomplicated: Secondary | ICD-10-CM | POA: Insufficient documentation

## 2021-01-21 DIAGNOSIS — Z79899 Other long term (current) drug therapy: Secondary | ICD-10-CM | POA: Insufficient documentation

## 2021-01-21 DIAGNOSIS — E1122 Type 2 diabetes mellitus with diabetic chronic kidney disease: Secondary | ICD-10-CM | POA: Insufficient documentation

## 2021-01-21 DIAGNOSIS — F29 Unspecified psychosis not due to a substance or known physiological condition: Secondary | ICD-10-CM | POA: Insufficient documentation

## 2021-01-21 DIAGNOSIS — F0390 Unspecified dementia without behavioral disturbance: Secondary | ICD-10-CM

## 2021-01-21 DIAGNOSIS — I10 Essential (primary) hypertension: Secondary | ICD-10-CM | POA: Diagnosis not present

## 2021-01-21 LAB — CBC WITH DIFFERENTIAL/PLATELET
Abs Immature Granulocytes: 0.01 10*3/uL (ref 0.00–0.07)
Basophils Absolute: 0 10*3/uL (ref 0.0–0.1)
Basophils Relative: 1 %
Eosinophils Absolute: 0.2 10*3/uL (ref 0.0–0.5)
Eosinophils Relative: 3 %
HCT: 36.4 % (ref 36.0–46.0)
Hemoglobin: 11.6 g/dL — ABNORMAL LOW (ref 12.0–15.0)
Immature Granulocytes: 0 %
Lymphocytes Relative: 27 %
Lymphs Abs: 1.5 10*3/uL (ref 0.7–4.0)
MCH: 28.1 pg (ref 26.0–34.0)
MCHC: 31.9 g/dL (ref 30.0–36.0)
MCV: 88.1 fL (ref 80.0–100.0)
Monocytes Absolute: 0.5 10*3/uL (ref 0.1–1.0)
Monocytes Relative: 9 %
Neutro Abs: 3.4 10*3/uL (ref 1.7–7.7)
Neutrophils Relative %: 60 %
Platelets: 227 10*3/uL (ref 150–400)
RBC: 4.13 MIL/uL (ref 3.87–5.11)
RDW: 13.9 % (ref 11.5–15.5)
WBC: 5.7 10*3/uL (ref 4.0–10.5)
nRBC: 0 % (ref 0.0–0.2)

## 2021-01-21 LAB — COMPREHENSIVE METABOLIC PANEL
ALT: 10 U/L (ref 0–44)
AST: 17 U/L (ref 15–41)
Albumin: 3.7 g/dL (ref 3.5–5.0)
Alkaline Phosphatase: 77 U/L (ref 38–126)
Anion gap: 9 (ref 5–15)
BUN: 28 mg/dL — ABNORMAL HIGH (ref 8–23)
CO2: 24 mmol/L (ref 22–32)
Calcium: 9.4 mg/dL (ref 8.9–10.3)
Chloride: 103 mmol/L (ref 98–111)
Creatinine, Ser: 1.65 mg/dL — ABNORMAL HIGH (ref 0.44–1.00)
GFR, Estimated: 31 mL/min — ABNORMAL LOW (ref >60)
Glucose, Bld: 258 mg/dL — ABNORMAL HIGH (ref 70–99)
Potassium: 3.9 mmol/L (ref 3.5–5.1)
Sodium: 136 mmol/L (ref 135–145)
Total Bilirubin: 0.6 mg/dL (ref 0.3–1.2)
Total Protein: 6.9 g/dL (ref 6.5–8.1)

## 2021-01-21 LAB — SALICYLATE LEVEL: Salicylate Lvl: <7 mg/dL — ABNORMAL LOW (ref 7.0–30.0)

## 2021-01-21 LAB — URINALYSIS, ROUTINE W REFLEX MICROSCOPIC
Bilirubin Urine: NEGATIVE
Glucose, UA: NEGATIVE mg/dL
Hgb urine dipstick: NEGATIVE
Ketones, ur: NEGATIVE mg/dL
Nitrite: NEGATIVE
Protein, ur: NEGATIVE mg/dL
Specific Gravity, Urine: 1.008 (ref 1.005–1.030)
pH: 6 (ref 5.0–8.0)

## 2021-01-21 LAB — ETHANOL: Alcohol, Ethyl (B): <10 mg/dL (ref <10)

## 2021-01-21 LAB — ACETAMINOPHEN LEVEL: Acetaminophen (Tylenol), Serum: <10 ug/mL — ABNORMAL LOW (ref 10–30)

## 2021-01-21 MED ORDER — ROSUVASTATIN CALCIUM 5 MG PO TABS
40.0000 mg | ORAL_TABLET | Freq: Every day | ORAL | Status: DC
  Administered 2021-01-22: 40 mg via ORAL
  Filled 2021-01-21 (×2): qty 8

## 2021-01-21 MED ORDER — INSULIN ASPART 100 UNIT/ML IJ SOLN
0.0000 [IU] | Freq: Three times a day (TID) | INTRAMUSCULAR | Status: DC
  Administered 2021-01-22: 5 [IU] via SUBCUTANEOUS
  Administered 2021-01-23: 8 [IU] via SUBCUTANEOUS

## 2021-01-21 MED ORDER — HYDROCHLOROTHIAZIDE 12.5 MG PO CAPS
12.5000 mg | ORAL_CAPSULE | Freq: Every day | ORAL | Status: DC
  Administered 2021-01-22: 12.5 mg via ORAL
  Filled 2021-01-21 (×2): qty 1

## 2021-01-21 NOTE — ED Notes (Signed)
Pt was wanded by security.  

## 2021-01-21 NOTE — ED Triage Notes (Signed)
Pt bib [employer] after IVC taken out by family. Per paperwork, pt has been acting erratic. Pt calm and cooperative with [employer]. Denies SI/HI.

## 2021-01-21 NOTE — ED Notes (Signed)
Pt belongings inventoried, valuables with security and items placed in locker number 5.

## 2021-01-21 NOTE — BH Assessment (Signed)
Comprehensive Clinical Assessment (CCA) Note  01/21/2021 Gail Lynch [id number]  DISPOSITION:  Gave clinical report to Leandro Reasoner, NP who recommended Pt be transferred to a geriatric psychiatry facility. Appropriate facilities will be contacted for placement. Notified Dr. Pearson Grippe and Manson Allan, RN of recommendation.  The patient demonstrates the following risk factors for suicide: Chronic risk factors for suicide include: N/A. Acute risk factors for suicide include: N/A. Protective factors for this patient include: responsibility to others (children, family), hope for the future and religious beliefs against suicide. Considering these factors, the overall suicide risk at this point appears to be low. Patient is appropriate for outpatient follow up.  [facility] from 01/21/2021 in [place] No Risk      Pt is an 80 year old female who presents unaccompanied to [facility] via law enforcement after being petitioned for involuntary commitment by Pt's niece Gail Lynch [id number]. Affidavit and petition states: "Respondent is hallucinating by seeing birds and flamable liquid throughout the residence. Respondent constantly hears her daughter crying at night(s). Respondent has been hostile with law enforcement and believes she sees demons. The respondent also knocks on neighbors' doors at night and usually prays on their porch. Respondent suffers from dementia of which she was diagnosed on January 2021. The housing authority where she resides has received several complaints of disturbance from other resident."   Pt has hearing loss and had difficulty participating in tele-assessment, stating "I can hear you but I can't understand what you're saying." Pt says she is uncertain why she was brought to the ED. She says it probably has to do with suffering stating, "There are two kinds of suffering. One is for the things we  have done. The other is for the suffering of Jesus Christ." Pt describes her mood recently as "pretty good." She denies feeling depressed or lonely. She denies problems with sleep. She says she has been eating less recently and knows she has lost weight but does not consider that a problem. She denies any suicidal ideation, stating that she is "ready to meet Jesus when it is my time" but that she has no intent to harm herself. She denies homicidal ideation. Pt appeared to not fully understand the question regarding whether she was experiencing hallucinations. She denies alcohol or other substance use.  Pt cannot identify any specific stressors. She lives alone. She says she has three children and grandchildren but they do not live close to her. When asked if she was walking around the neighborhood, Pt says she was. When asked why, Pt says she was "upset" but cannot say what upset her. Pt was unable to explain why she had gone to neighbor's houses.   Dr Pearson Grippe reports: By telephone discussion with the patient's daughter, Gail Lynch: She tells me that her mother has been "getting worse" over the last 6 months.  She lives by herself and there is no one there to take care of her.  She has been wandering around her neighborhood at all times of the day and even night.  She has been found sitting on neighbors front porch either crying or singing.  She knocks on people's doors in the middle of the night and rings her doorbell's asking for family members or friends.  Sedric also tells me that her mother has been having apparent hallucinations at all times of the day and night.  She talks about seeing people and hearing voices.  This has been ongoing for several months but is recently getting worse. Patient has apparently never had any diagnosed psychiatric conditions.  Has never been diagnosed with dementia or other aging brain disease.  Daughter is not aware of any previous psychiatric care or commitment  even early in her life.  Pt is dressed in hospital scrubs, alert and oriented person, place and time. Pt speaks in a clear tone, at moderate volume and normal pace. Motor behavior appears normal. Eye contact is fair. Pt's mood is euthmic and affect is congruent with mood. Thought process is coherent and relevant. There is no indication Pt is currently responding to internal stimuli. Pt was calm and cooperative throughout assessment.   Chief Complaint: No chief complaint on file.  Visit Diagnosis: F29 Unspecified psychotic disorder   CCA Screening, Triage and Referral (STR)  Patient Reported Information How did you hear about [place]? No data recorded Referral name: No data recorded Referral phone number: No data recorded  Whom do you see for routine medical problems? No data recorded Practice/Facility Name: No data recorded Practice/Facility Phone Number: No data recorded Name of Contact: No data recorded Contact Number: No data recorded Contact Fax Number: No data recorded Prescriber Name: No data recorded Prescriber Address (if known): No data recorded  What Is the Reason for Your Visit/Call Today? No data recorded How Long Has This Been Causing You Problems? No data recorded What Do You Feel Would Help You the Most Today? No data recorded  Have You Recently Been in Any Inpatient Treatment (Hospital/Detox/Crisis Center/28-Day Program)? No data recorded Name/Location of Program/Hospital:No data recorded How Long Were You There? No data recorded When Were You Discharged? No data recorded  Have You Ever Received Services From [facility] Before? No data recorded Who Do You See at [facility]? No data recorded  Have You Recently Had Any Thoughts About Hurting Yourself? No data recorded Are You Planning to Commit Suicide/Harm Yourself At This time? No data recorded  Have you Recently Had Thoughts About [place]? No data recorded Explanation: No data recorded  Have You  Used Any Alcohol or Drugs in the Past 24 Hours? No data recorded How Long Ago Did You Use Drugs or Alcohol? No data recorded What Did You Use and How Much? No data recorded  Do You Currently Have a Therapist/Psychiatrist? No data recorded Name of Therapist/Psychiatrist: No data recorded  Have You Been Recently Discharged From Any Office Practice or Programs? No data recorded Explanation of Discharge From Practice/Program: No data recorded    CCA Screening Triage Referral Assessment Type of Contact: No data recorded Is this Initial or Reassessment? No data recorded Date Telepsych consult ordered in [facility]:  No data recorded Time Telepsych consult ordered in [facility]:  No data recorded  Patient Reported Information Reviewed? No data recorded Patient Left Without Being Seen? No data recorded Reason for Not Completing Assessment: No data recorded  Collateral Involvement: No data recorded  Does Patient Have a [place]? No data recorded Name and Contact of Legal Guardian: No data recorded If Minor and Not Living with Parent(s), Who has Custody? No data recorded Is CPS involved or ever been involved? No data recorded Is APS involved or ever been involved? No data recorded  Patient Determined To Be At Risk for Harm To Self or Others Based on Review of Patient Reported Information or Presenting Complaint? No data recorded Method: No data recorded Availability of Means: No data recorded Intent: No data recorded  Notification Required: No data recorded Additional Information for Danger to Others Potential: No data recorded Additional Comments for Danger to Others Potential: No data recorded Are There Guns or Other Weapons in Ward? No data recorded Types of Guns/Weapons: No data recorded Are These Weapons Safely Secured?                            No data recorded Who Could Verify You Are Able To Have These Secured: No data recorded Do You Have any Outstanding Charges,  Pending Court Dates, Parole/Probation? No data recorded Contacted To Inform of Risk of Harm To Self or Others: No data recorded  Location of Assessment: No data recorded  Does Patient Present under Involuntary Commitment? No data recorded IVC Papers Initial File Date: No data recorded  [place] of Residence: No data recorded  Patient Currently Receiving the Following Services: No data recorded  Determination of Need: No data recorded  Options For Referral: No data recorded    CCA Biopsychosocial Intake/Chief Complaint:  Pt petitioned for IVC by family due to confusion, walking around the neighborhood looking for family members, possible auditory and visual hallucinations.  Current Symptoms/Problems: Pt denies depressive symptoms. Pt says she walks the neighborhood because she is "upset" but cannot explain why.   Patient Reported Schizophrenia/Schizoaffective Diagnosis in Past: No   Strengths: Pt has a strong religious faith.  Preferences: None identified.  Abilities: Pt has been living independently.   Type of Services Patient Feels are Needed: Pt says she does not know.   Initial Clinical Notes/Concerns: Pt does not appear to understand why she is in the hospital.   Mental Health Symptoms Depression:  Difficulty Concentrating; Weight gain/loss   Duration of Depressive symptoms: Greater than two weeks   Mania:  None   Anxiety:   Difficulty concentrating; Fatigue; Restlessness; Tension; Worrying   Psychosis:  Hallucinations   Duration of Psychotic symptoms: Less than six months   Trauma:  None   Obsessions:  None   Compulsions:  None   Inattention:  N/A   Hyperactivity/Impulsivity:  N/A   Oppositional/Defiant Behaviors:  N/A   Emotional Irregularity:  None   Other Mood/Personality Symptoms:  NA    Mental Status Exam Appearance and self-care  Stature:  Small   Weight:  Average weight   Clothing:  Casual   Grooming:  Normal   Cosmetic use:   Age appropriate   Posture/gait:  Stooped   Motor activity:  Not Remarkable   Sensorium  Attention:  Normal   Concentration:  Normal   Orientation:  Person; Place; Time   Recall/memory:  Normal   Affect and Mood  Affect:  Appropriate   Mood:  Euthymic   Relating  Eye contact:  Normal   Facial expression:  Responsive   Attitude toward examiner:  Cooperative   Thought and Language  Speech flow: Clear and Coherent   Thought content:  Appropriate to Mood and Circumstances   Preoccupation:  None   Hallucinations:  None   Organization:  No data recorded  [employer] of Knowledge:  Average   Intelligence:  Average   Abstraction:  Normal   Judgement:  Impaired   Reality Testing:  Variable   Insight:  Gaps   Decision Making:  Vacilates   Social Functioning  Social Maturity:  Responsible   Social Judgement:  Normal   Stress  Stressors:  Transitions   Coping Ability:  Deficient supports  Skill Deficits:  Self-care   Supports:  Support needed; Family     Religion: Religion/Spirituality Are You A Religious Person?: Yes What is Your Religious Affiliation?: Pentecostal How Might This Affect Treatment?: Pt is very religious  Leisure/Recreation: Leisure / Recreation Do You Have Hobbies?: No  Exercise/Diet: Exercise/Diet Do You Exercise?: Yes What Type of Exercise Do You Do?: Run/Walk How Many Times a Week Do You Exercise?: 1-3 times a week Have You Gained or Lost A Significant Amount of Weight in the Past Six Months?: Yes-Lost Number of Pounds Lost?: 5 Do You Follow a Special Diet?: No Do You Have Any Trouble Sleeping?: No   CCA Employment/Education Employment/Work Situation: Employment / Work Copywriter, advertising Employment situation: Retired Has patient ever been in the [employer]?: No  Education: Education Is Patient Currently Attending School?: No   CCA Family/Childhood History Family and Relationship History: Family  history Are you sexually active?: No What is your sexual orientation?: Heterosexual Has your sexual activity been affected by drugs, alcohol, medication, or emotional stress?: NA Does patient have children?: Yes How many children?: 3 How is patient's relationship with their children?: Good. Children do not live locally.  Childhood History:  Childhood History Did patient suffer any verbal/emotional/physical/sexual abuse as a child?: No Did patient suffer from severe childhood neglect?: No Has patient ever been sexually abused/assaulted/raped as an adolescent or adult?: No Was the patient ever a victim of a crime or a disaster?: No Witnessed domestic violence?: No Has patient been affected by domestic violence as an adult?: No  Child/Adolescent Assessment:     CCA Substance Use Alcohol/Drug Use:                           ASAM's:  Six Dimensions of Multidimensional Assessment  Dimension 1:  Acute Intoxication and/or Withdrawal Potential:      Dimension 2:  Biomedical Conditions and Complications:      Dimension 3:  Emotional, Behavioral, or Cognitive Conditions and Complications:     Dimension 4:  Readiness to Change:     Dimension 5:  Relapse, Continued use, or Continued Problem Potential:     Dimension 6:  Recovery/Living Environment:     ASAM Severity Score:    ASAM Recommended Level of Treatment:     Substance use Disorder (SUD)    Recommendations for Services/Supports/Treatments:    DSM5 Diagnoses: Patient Active Problem List   Diagnosis Date Noted  . Non compliance with medical treatment 07/31/2020  . Senile paranoid dementia ([place]) 07/29/2020  . Diabetic leg ulcer ([place]) 07/29/2020  . Benign neoplasm of ascending colon   . Benign neoplasm of transverse colon   . Benign neoplasm of descending colon   . Benign neoplasm of sigmoid colon   . History of colon polyps 05/04/2020  . Complete AV block ([place]) 03/09/2020  . Mild aortic stenosis 03/14/2017  .  Peripheral edema 03/14/2017  . Arm heaviness 03/14/2017  . CKD (chronic kidney disease), stage III ([place]) 05/20/2016  . Diabetes ([place]) 01/03/2016  . Dysuria 12/09/2014  . Peripheral neuropathy 12/05/2013  . Irregular heart beat 12/05/2013  . PVC (premature ventricular contraction) 12/05/2013  . Sciatica 06/17/2013  . Pain due to onychomycosis of toenail 05/21/2013  . Mycotic toenails   . Vagina bleeding 08/06/2012  . Carotid stenosis 06/18/2012  . Constipation 04/30/2012  . Childhood asthma 12/18/2011  . Glaucoma 12/18/2011  . Hyperlipidemia 12/18/2011  . Colon polyps 12/18/2011  . Preventative health care 12/17/2011  .  Hypertension 12/17/2011  . Hard of hearing   . Dysplasia of cervix, low grade (CIN 1)   . HPV (human papilloma virus) infection   . Brain mass 09/18/2011    Patient Centered Plan: Patient is on the following Treatment Plan(s):     Referrals to Alternative Service(s): Referred to Alternative Service(s):   Place:   Date:   Time:    Referred to Alternative Service(s):   Place:   Date:   Time:    Referred to Alternative Service(s):   Place:   Date:   Time:    Referred to Alternative Service(s):   Place:   Date:   Time:     Evelena Peat, [facility]

## 2021-01-21 NOTE — ED Provider Notes (Signed)
Emergency Medicine Provider Triage Evaluation Note  Gail Lynch , a 80 y.o. female  was evaluated in triage.  Pt comes to ED under IVC.  Apparently she was disturbing neighbors, crying on their porch and stating that she was seeing birds.  She denies any suicidal homicidal ideation.  Review of Systems  Positive: Hallucinations Negative: SI, HI  Physical Exam  BP (!) 157/60 (BP Location: Left Arm)   Pulse 72   Temp 98.7 F (37.1 C) (Oral)   Resp 16   SpO2 100%  Gen:   Awake, no distress Resp:  Normal MSK:   Moves extremities without difficulty Other:  Pleasant denying any SI or HI  Medical Decision Making  Medically screening exam initiated at 12:52 PM.  Appropriate orders placed.  Gail Lynch was informed that the remainder of the evaluation will be completed by another provider, this initial triage assessment does not replace that evaluation, and the importance of remaining in the ED until their evaluation is complete.  Will order medical screening lab   Delia Heady, PA-C 01/21/21 1253    [name], MD 01/24/21 1341

## 2021-01-21 NOTE — BH Assessment (Signed)
Leandro Reasoner, NP recommends inpatient geriatric-psychiatry treatment. Disposition Counselor faxed patient out to multiple facilities for consideration of be placement. See hospitals listed below.   [facility] Details     [facility] Details     [facility] Details     [facility] Details     [facility] Details     [facility] Details     [facility] Details     [facility] Details     [facility] Details     [facility] Details     [facility] Details     [facility] Details     [facility] Details     [place] Details     [facility] Details     [facility] Details     [facility] Details     [place] Details     [facility] Details     [facility] Details     [facility] Details     [facility] Details     [facility] Details     [facility] Details     [place] Details

## 2021-01-21 NOTE — ED Provider Notes (Signed)
[facility] Provider Note   CSN: [id number] Arrival date & time: 01/21/21  1156     History No chief complaint on file.   Gail [facility] is a 80 y.o. female.  HPI: Patient is an 80 year old female with history listed below who presents to the emergency department under involuntary commitment, paperwork filed by family members.  At the time of my exam patient was asymptomatic.  She denied headache, chest pain, shortness of breath, abdominal pain, urinary symptoms.  Denied any recent illness or trauma. Denies suicidal or homicidal ideation.  By telephone discussion with the patient's daughter, Gail Lynch: She tells me that her mother has been "getting worse" over the last 6 months.  She lives by herself and there is no one there to take care of her.  She has been wandering around her neighborhood at all times of the day and even night.  She has been found sitting on neighbors front porch either crying or singing.  She knocks on people's doors in the middle of the night and rings her doorbell's asking for family members or friends.  Gail Lynch also tells me that her mother has been having apparent hallucinations at all times of the day and night.  She talks about seeing people and hearing voices.  This has been ongoing for several months but is recently getting worse.  Patient has apparently never had any diagnosed psychiatric conditions.  Has never been diagnosed with dementia or other aging brain disease.  Daughter is not aware of any previous psychiatric care or commitment even early in her life.     Past Medical History:  Diagnosis Date  . Acute lower GI bleeding 05/03/2020  . Arthritis    gout in feet  . Asthma    hx as a child - no inhaler, no problems  . Carotid stenosis 06/18/2012  . Colon polyps   . Diabetes mellitus    Type 2  . Dysplasia of cervix, low grade (CIN 1)    s/p cervical conization  . Glaucoma   . Glaucoma 12/18/2011  .  Hard of hearing    hear aids - left  . HPV (human papilloma virus) infection   . Hyperlipidemia   . Hypertension   . Intrinsic asthma, unspecified 12/18/2011  . Mild aortic stenosis 03/14/2017  . Mycotic toenails 02/19/2013  . PONV (postoperative nausea and vomiting)   . SVD (spontaneous vaginal delivery)    x 4    Patient Active Problem List   Diagnosis Date Noted  . Non compliance with medical treatment 07/31/2020  . Senile paranoid dementia ([place]) 07/29/2020  . Diabetic leg ulcer ([place]) 07/29/2020  . Benign neoplasm of ascending colon   . Benign neoplasm of transverse colon   . Benign neoplasm of descending colon   . Benign neoplasm of sigmoid colon   . History of colon polyps 05/04/2020  . Complete AV block ([place]) 03/09/2020  . Mild aortic stenosis 03/14/2017  . Peripheral edema 03/14/2017  . Arm heaviness 03/14/2017  . CKD (chronic kidney disease), stage III ([place]) 05/20/2016  . Diabetes ([place]) 01/03/2016  . Dysuria 12/09/2014  . Peripheral neuropathy 12/05/2013  . Irregular heart beat 12/05/2013  . PVC (premature ventricular contraction) 12/05/2013  . Sciatica 06/17/2013  . Pain due to onychomycosis of toenail 05/21/2013  . Mycotic toenails   . Vagina bleeding 08/06/2012  . Carotid stenosis 06/18/2012  . Constipation 04/30/2012  . Childhood asthma 12/18/2011  . Glaucoma 12/18/2011  . Hyperlipidemia 12/18/2011  .  Colon polyps 12/18/2011  . Preventative health care 12/17/2011  . Hypertension 12/17/2011  . Hard of hearing   . Dysplasia of cervix, low grade (CIN 1)   . HPV (human papilloma virus) infection   . Brain mass 09/18/2011    Past Surgical History:  Procedure Laterality Date  . BREAST BIOPSY  2010  . BREAST BIOPSY  1991  . COLONOSCOPY    . COLONOSCOPY WITH PROPOFOL N/A 05/06/2020   Procedure: COLONOSCOPY WITH PROPOFOL;  Surgeon: Jerene Bears, MD;  Location: [facility] ENDOSCOPY;  Service: Endoscopy;  Laterality: N/A;  . DILATION AND CURETTAGE OF UTERUS  2000  .  HYSTEROSCOPY WITH D & C N/A 03/04/2014   Procedure: DILATATION AND CURETTAGE /HYSTEROSCOPY;  Surgeon: Lavonia Drafts, MD;  Location: [facility];  Service: Gynecology;  Laterality: N/A;  . KNEE SURGERY  2010   patella fracture  . MULTIPLE TOOTH EXTRACTIONS    . PACEMAKER IMPLANT N/A 03/09/2020   Procedure: PACEMAKER IMPLANT;  Surgeon: Constance Haw, MD;  Location: [place] [facility];  Service: Cardiovascular;  Laterality: N/A;  . POLYPECTOMY    . POLYPECTOMY  05/06/2020   Procedure: POLYPECTOMY;  Surgeon: Jerene Bears, MD;  Location: [facility] ENDOSCOPY;  Service: Endoscopy;;     OB History    Gravida  3   Para  3   Term  3   Preterm  0   AB  0   Living  3     SAB  0   IAB  0   Ectopic  0   Multiple  0   Live Births              Family History  Problem Relation Age of Onset  . Diabetes Brother   . Heart disease Brother   . Heart disease Mother   . Heart disease Father   . Asthma Other   . Colon cancer Neg Hx   . Rectal cancer Neg Hx   . Stomach cancer Neg Hx   . Esophageal cancer Neg Hx     Social History   Tobacco Use  . Smoking status: Former Smoker    Types: Cigarettes    Quit date: 08/21/1980    Years since quitting: 40.4  . Smokeless tobacco: Former Network engineer  . Vaping Use: Never used  Substance Use Topics  . Alcohol use: No    Alcohol/week: 0.0 standard drinks  . Drug use: No    Home Medications Prior to Admission medications   Medication Sig Start Date End Date Taking? Authorizing Provider  allopurinol (ZYLOPRIM) 300 MG tablet Take 1 tablet (300 mg total) by mouth 2 (two) times daily. 07/29/20   Biagio Borg, MD  glucose blood [facility] VERIO) test strip 1 each by Other route 2 (two) times daily. And lancets 2/day 08/03/20   Renato Shin, MD  hydrALAZINE (APRESOLINE) 25 MG tablet Take 1 tablet (25 mg total) by mouth every 8 (eight) hours. 07/29/20   Biagio Borg, MD  hydrochlorothiazide (MICROZIDE) 12.5 MG capsule Take 1  capsule (12.5 mg total) by mouth daily. 07/29/20 07/29/21  Biagio Borg, MD  Insulin NPH, Human,, Isophane, (HUMULIN N KWIKPEN) 100 UNIT/ML Kiwkpen Inject 50 Units into the skin every morning. 08/03/20   Renato Shin, MD  rosuvastatin (CRESTOR) 40 MG tablet Take 1 tablet (40 mg total) by mouth daily. 07/29/20   Biagio Borg, MD    Allergies    Aspirin  Review of Systems  Review of Systems  Constitutional: Negative for chills and fever.  HENT: Negative for ear pain and sore throat.   Eyes: Negative for pain and visual disturbance.  Respiratory: Negative for cough and shortness of breath.   Cardiovascular: Negative for chest pain and palpitations.  Gastrointestinal: Negative for abdominal pain and vomiting.  Genitourinary: Negative for dysuria and hematuria.  Musculoskeletal: Negative for arthralgias and back pain.  Skin: Negative for color change and rash.  Neurological: Negative for seizures and syncope.  All other systems reviewed and are negative.   Physical Exam Updated Vital Signs BP (!) 181/81 (BP Location: Left Arm)   Pulse 90   Temp 97.7 F (36.5 C) (Oral)   Resp 16   SpO2 98%   Physical Exam Vitals and nursing note reviewed.  Constitutional:      General: She is not in acute distress.    Appearance: She is well-developed.  HENT:     Head: Normocephalic and atraumatic.  Eyes:     Conjunctiva/sclera: Conjunctivae normal.  Cardiovascular:     Rate and Rhythm: Normal rate and regular rhythm.     Heart sounds: No murmur heard.   Pulmonary:     Effort: Pulmonary effort is normal. No respiratory distress.     Breath sounds: Normal breath sounds.  Abdominal:     Palpations: Abdomen is soft.     Tenderness: There is no abdominal tenderness.  Musculoskeletal:     Cervical back: Neck supple.  Skin:    General: Skin is warm and dry.  Neurological:     Mental Status: She is alert.  Psychiatric:        Attention and Perception: Perception normal. She is  inattentive.        Mood and Affect: Mood and affect normal.        Speech: Speech normal.        Behavior: Behavior is cooperative.        Thought Content: Thought content is paranoid and delusional. Thought content does not include homicidal or suicidal ideation.        Cognition and Memory: Cognition is impaired. Memory is impaired.        Judgment: Judgment normal.     Comments:  Does not appear to be responding to internal stimuli however she can be seen sitting in her bed seemingly talking to herself.  She will also sing songs     ED Results / Procedures / Treatments   Labs (all labs ordered are listed, but only abnormal results are displayed) Labs Reviewed  COMPREHENSIVE METABOLIC PANEL - Abnormal; Notable for the following components:      Result Value   Glucose, Bld 258 (*)    BUN 28 (*)    Creatinine, Ser 1.65 (*)    GFR, Estimated 31 (*)    All other components within normal limits  CBC WITH DIFFERENTIAL/PLATELET - Abnormal; Notable for the following components:   Hemoglobin 11.6 (*)    All other components within normal limits  ACETAMINOPHEN LEVEL - Abnormal; Notable for the following components:   Acetaminophen (Tylenol), Serum <10 (*)    All other components within normal limits  SALICYLATE LEVEL - Abnormal; Notable for the following components:   Salicylate Lvl <6.3 (*)    All other components within normal limits  ETHANOL  RAPID URINE DRUG SCREEN, HOSP PERFORMED  URINALYSIS, ROUTINE W REFLEX MICROSCOPIC    EKG None  Radiology No results found.  Procedures Procedures   Medications Ordered in ED  Medications - No data to display  ED Course  I have reviewed the triage vital signs and the nursing notes.  Pertinent labs & imaging results that were available during my care of the patient were reviewed by me and considered in my medical decision making (see chart for details).  Clinical Course as of 01/22/21 0110  Fri Jan 21, 2021  [facility] without  acute findings to suggest an etiology to her presentation. Psych consulted  [ZB]    Clinical Course User Index [ZB] Pearson Grippe, DO   MDM Rules/Calculators/A&P                          Patient is an 80 year old female with history as above not previously evaluated or diagnosed with dementia of any kind known to family who presented to the emergency department under involuntary commitment as family was concerned she poses a significant risk to her own life based on her actions. In the emergency department she was hemodynamically stable, slightly hypertensive, afebrile.  She had a nonfocal neurologic exam.  She does display paranoia and does have delusions however also has impaired cognition and memory. No previous history of schizophrenia.  I am considering organic causes such as infection, metabolic/electrolyte derangement, cranial neoplasm, CVA. However, she does not seem encephalopathic.  Likely developing dementia of unclear type with psychiatric features including paranoia, hallucinations and delusions.   Ordered for lab, UA and CTH to r/o organic causes.  If negative, will consult psychiatry for evaluation.  ED course: Psych recommending inpatient geriatric psych admission. EDSW working on placement. I ordered screening COVID test and her home medications.  Final Clinical Impression(s) / ED Diagnoses Final diagnoses:  None    Rx / DC Orders ED Discharge Orders    None       Pearson Grippe, DO 01/22/21 0111    Sherwood Gambler, MD 01/22/21 1040

## 2021-01-22 DIAGNOSIS — F29 Unspecified psychosis not due to a substance or known physiological condition: Secondary | ICD-10-CM | POA: Diagnosis not present

## 2021-01-22 LAB — RESP PANEL BY RT-PCR (FLU A&B, COVID) ARPGX2
Influenza A by PCR: NEGATIVE
Influenza B by PCR: NEGATIVE
SARS Coronavirus 2 by RT PCR: NEGATIVE

## 2021-01-22 LAB — CBG MONITORING, ED
Glucose-Capillary: 246 mg/dL — ABNORMAL HIGH (ref 70–99)
Glucose-Capillary: 242 mg/dL — ABNORMAL HIGH (ref 70–99)
Glucose-Capillary: 183 mg/dL — ABNORMAL HIGH (ref 70–99)
Glucose-Capillary: 272 mg/dL — ABNORMAL HIGH (ref 70–99)

## 2021-01-22 MED ORDER — QUETIAPINE FUMARATE 25 MG PO TABS
12.5000 mg | ORAL_TABLET | Freq: Two times a day (BID) | ORAL | Status: DC
  Filled 2021-01-22 (×4): qty 1

## 2021-01-22 NOTE — ED Notes (Signed)
Checked patient cbg it was 246 notified RN of blood sugar  

## 2021-01-22 NOTE — ED Notes (Signed)
Patient is refusing to take her medications

## 2021-01-22 NOTE — ED Notes (Signed)
Patients daughter Oren Bracket would like an update [phone]

## 2021-01-22 NOTE — Progress Notes (Signed)
Per Clovis Riley, patient meets criteria for inpatient treatment. There are no available or appropriate beds at [facility] today. CSW faxed referrals to the following facilities for review:  [facility]  TTS will continue to seek bed placement.  Glennie Isle, MSW, [place], LCAS-A Phone: [phone] Disposition/TOC

## 2021-01-22 NOTE — ED Notes (Signed)
Raquel Sarna, son, [phone] would like an update when available

## 2021-01-23 DIAGNOSIS — F29 Unspecified psychosis not due to a substance or known physiological condition: Secondary | ICD-10-CM | POA: Diagnosis not present

## 2021-01-23 LAB — CBG MONITORING, ED
Glucose-Capillary: 225 mg/dL — ABNORMAL HIGH (ref 70–99)
Glucose-Capillary: 267 mg/dL — ABNORMAL HIGH (ref 70–99)

## 2021-01-23 NOTE — ED Notes (Signed)
Patient awake, reading bible and praying in room. No distress noted at this time.

## 2021-01-23 NOTE — ED Notes (Signed)
Patient refused CBG. 

## 2021-01-23 NOTE — BH Assessment (Signed)
Requested nursing to place TTS machine in patient's room for TTS re-assessment.

## 2021-01-23 NOTE — ED Notes (Signed)
Breakfast Ordered 

## 2021-01-23 NOTE — BH Assessment (Addendum)
Re-Assessment 01/23/2021:   Patient is an 80 year old female with history listed below who presents to the emergency department under involuntary commitment, paperwork filed by family members. Per paperwork, pt has been acting erratic.  Clinician reviewed patient's chart prior to her re-assessment today. The EDP noted on 01/21/2021--"By telephone discussion with the patient's daughter, Lonisha Bobby: She tells me that her mother has been "getting worse" over the last 6 months.  She lives by herself and there is no one there to take care of her.  She has been wandering around her neighborhood at all times of the day and even night.  She has been found sitting on neighbors front porch either crying or singing.  She knocks on people's doors in the middle of the night and rings her doorbell's asking for family members or friends.  Sedric also tells me that her mother has been having apparent hallucinations at all times of the day and night.  She talks about seeing people and hearing voices.  This has been ongoing for several months but is recently getting worse". Additional notes indicate that patient was apparently never had any diagnosed psychiatric conditions.  Has never been diagnosed with dementia or other aging brain disease.  Daughter is not aware of any previous psychiatric care or commitment even early in her life.  Clinician met with patient to complete her TTS Re-Assessment: Upon initial presentation she is alert, calm, cooperative, polite, and pleasant. She is hard of hearing which required to repeat questions throughout the assessment.   Patient explains that "2 police ladies showed up at my door as expected because they said they would be checking in on me every now an then". She was told by the "police ladies" that neighbors reported her being a disturbance. Patient denies that she was disturbing the neighbors. Says, "I don't bother anyone, I live by myself, read my bible, and sometimes I sit on my  porch and sing church songs".   She is aware that neighbors have complained of her "walking the streets". Patient denies that this is true. Explains that 2 days ago she accidentally locked herself out of the home. She didn't have her phone to call anyone. Therefore, patient says she walked down her street in hopes of asking someone for help.   Patient denies current suicidal thoughts. Denies prior history of suicide attempts and/or gestures. Denies homicidal ideations. Denies history of aggression as she states, "I'm 80 yrs old mam, I don't get in fights". She denies AVH's. However, references religious content and values. States:  "When the "spirit" speaks to me, I hear it". Patient describes the "sprit" to be "God" as a guide to determine what is right from wrong.  She reports that her appetite is good. States that she doesn't eat a lot of meat but loves "beef hotdogs", fried potatoes and also likes them mashed, soup, salads, beans and juice. Patient denies any issues with sleeping.   Her support system is a niece and "I have 3-4 other people that come help me". She is a widow. She had #3 children total. States that one of her children are deceased. Patient lives home alone.  Denies that she has a mental health history. Also, denies that she has ever seen a therapist and/or psychiatrist. Patient asked how does she feel she can best be helped today and her response is "Let me go home, so I can be comfortable".    Patient is oriented to year, month, and is aware that she  is at [facility] in the ER. She recited her her full name and DOB. Patient is aware of the circumstances leading to her ED visit.  Disposition: Per Merlyn Lot, NP, patient to remain in the ED for overnight observation. Pending am psych evaluation.

## 2021-01-23 NOTE — ED Notes (Signed)
Patient allowed staff to obtain CBG, however, will not accept insulin at this time. Patient sitting upright on side of bed with breakfast tray. No other needs expressed at this time.

## 2021-01-23 NOTE — ED Notes (Signed)
Patient initially agreed to take medications, however, refused upon examining medications in med cup, stating that they were not the same medications she takes at home. Despite RN explaining that pills are not always supplied in consistent colors, the patient insisted that she was not taking them.

## 2021-01-24 DIAGNOSIS — F29 Unspecified psychosis not due to a substance or known physiological condition: Secondary | ICD-10-CM | POA: Diagnosis not present

## 2021-01-24 LAB — CBG MONITORING, ED
Glucose-Capillary: 189 mg/dL — ABNORMAL HIGH (ref 70–99)
Glucose-Capillary: 215 mg/dL — ABNORMAL HIGH (ref 70–99)

## 2021-01-24 LAB — HEMOGLOBIN A1C
Hgb A1c MFr Bld: 9.2 % — ABNORMAL HIGH (ref 4.8–5.6)
Mean Plasma Glucose: 217 mg/dL

## 2021-01-24 NOTE — Consult Note (Addendum)
Telepsych Consultation   Reason for Consult:  Psychosis: delusions and hallucinations both auditory and visual. Referring Physician:  Pearson Grippe DO Location of Patient: [facility] Location of Provider: Other: [facility]  Patient Identification: [facility] MRN:  [id number] Principal Diagnosis: Psychosis in elderly [facility]) Diagnosis:  Principal Problem:   Psychosis in elderly [facility])   Total Time spent with patient: 30 minutes  Subjective:   Gail [facility] is a 80 y.o. female patient admitted under IVC and seen via tele health by this provider; chart reviewed and consulted with Dr. Dwyane Dee on 01/24/21.  On evaluation, when asked why she was brought to the hospital patient states, "I am supposed to know right from wrong. I am in my right mind."  When asked if she's having thoughts to hurt herself, or suicide, she states "no." When asked if he's having thoughts to hurt other people, she states "no." When asked if she hear voices, or see things other people can't hear, see, or hallucinations, she states, "I don't sit down and think about those things." When asked if she's feeling like people are following her or spying on her, she states "no" but states she does smell food when she's out before she eats it.  Patient asked about wondering outside and going to her neighbors home.  Patient states she was locked out of her home and did not have cell phone to call any one.  "I walked to the office, there was a car in the parking lot but when knocked on door no one answered.  I don't think they were gone but late in even and just didn't open the door."  Patient states when she sits in the house her family tells her "Why you sitting in the house, you need to go out side.  I don like going out in the sun. I might go out in back in the morning when it is cooler because it is less sun in the back, but I pretty much stay in."    Patient states that she is able to cook, clean and grocery shop on her own. She  states that her granddaughter picks her up and takes her to the grocery store. She identifies her two daughters, nieces, granddaughter and church members as her support system.   She states that she is compliant with taking her medications and denies any medication side effects. No side effects noted.   During evaluation Shirel Mallis is in a standing position, she is alert and oriented to person, place and time. She is  calm/cooperative; and mood is euthymic and congruent with affect. She is speaking in a clear tone at moderate volume, and normal pace; with good eye contact. Her thought process is coherent and relevant; There is no indication that she is currently responding to internal/external stimuli or experiencing delusional thought content. She denies suicidal/self-harm/homicidal ideation, psychosis, and paranoia.  She has remained calm throughout assessment and has answered questions appropriately.   Collateral: this provider notified the patient's daughter Oren Bracket [phone] that the patient has been psychiatrically cleared and does not meet inpatient criteria. Sadric expressed concerns regarding the patient living alone on her own and requested information for assisted living options. She states that one of the patient's children will pick up the patient today from the hospital.   HPI:  presents to the emergency department under involuntary commitment, paperwork filed by family members. Per paperwork, pt has been acting erratic.  Clinician reviewed patient's chart prior to her re-assessment today.  The EDP noted on 01/21/2021--"By telephone discussion with the patient's daughter,SadricBonner: She tells me that her mother has been "getting worse" over the last 6 months. She lives by herself and there is no one there to take care of her. She has been wandering around her neighborhood at all times of the day and even night. She has been found sitting on neighbors front porch either crying or  singing. She knocks on people's doors in the middle of the night and rings her doorbell's asking for family members or friends. Sedric also tells me that her mother has been having apparent hallucinations at all times of the day and night. She talks about seeing people and hearing voices. This has been ongoing for several months but is recently getting worse". Additional notes indicate that patient was apparently never had any diagnosed psychiatric conditions. Has never been diagnosed with dementia or other aging brain disease.Daughter is not aware of any previous psychiatric care or commitment even early in her life.  Past Psychiatric History: No prior hx.  Risk to Self:  No Risk to Others:  No Prior Inpatient Therapy:   Unknown Prior Outpatient Therapy:  Unknown  Past Medical History:  Past Medical History:  Diagnosis Date  . Acute lower GI bleeding 05/03/2020  . Arthritis    gout in feet  . Asthma    hx as a child - no inhaler, no problems  . Carotid stenosis 06/18/2012  . Colon polyps   . Diabetes mellitus    Type 2  . Dysplasia of cervix, low grade (CIN 1)    s/p cervical conization  . Glaucoma   . Glaucoma 12/18/2011  . Hard of hearing    hear aids - left  . HPV (human papilloma virus) infection   . Hyperlipidemia   . Hypertension   . Intrinsic asthma, unspecified 12/18/2011  . Mild aortic stenosis 03/14/2017  . Mycotic toenails 02/19/2013  . PONV (postoperative nausea and vomiting)   . SVD (spontaneous vaginal delivery)    x 4    Past Surgical History:  Procedure Laterality Date  . BREAST BIOPSY  2010  . BREAST BIOPSY  1991  . COLONOSCOPY    . COLONOSCOPY WITH PROPOFOL N/A 05/06/2020   Procedure: COLONOSCOPY WITH PROPOFOL;  Surgeon: Jerene Bears, MD;  Location[facility] ENDOSCOPY;  Service: Endoscopy;  Laterality: N/A;  . DILATION AND CURETTAGE OF UTERUS  2000  . HYSTEROSCOPY WITH D & C N/A 03/04/2014   Procedure: DILATATION AND CURETTAGE /HYSTEROSCOPY;  Surgeon:  Lavonia Drafts, MD;  Location: [facility];  Service: Gynecology;  Laterality: N/A;  . KNEE SURGERY  2010   patella fracture  . MULTIPLE TOOTH EXTRACTIONS    . PACEMAKER IMPLANT N/A 03/09/2020   Procedure: PACEMAKER IMPLANT;  Surgeon: Constance Haw, MD;  Location: [facility];  Service: Cardiovascular;  Laterality: N/A;  . POLYPECTOMY    . POLYPECTOMY  05/06/2020   Procedure: POLYPECTOMY;  Surgeon: Jerene Bears, MD;  Location: [facility] ENDOSCOPY;  Service: Endoscopy;;   Family History:  Family History  Problem Relation Age of Onset  . Diabetes Brother   . Heart disease Brother   . Heart disease Mother   . Heart disease Father   . Asthma Other   . Colon cancer Neg Hx   . Rectal cancer Neg Hx   . Stomach cancer Neg Hx   . Esophageal cancer Neg Hx    Family Psychiatric  History: Unknown Social History:  Social History  Substance and Sexual Activity  Alcohol Use No  . Alcohol/week: 0.0 standard drinks     Social History   Substance and Sexual Activity  Drug Use No    Social History   Socioeconomic History  . Marital status: Widowed    Spouse name: Not on file  . Number of children: 3  . Years of education: Not on file  . Highest education level: Not on file  Occupational History  . Occupation: retired  Tobacco Use  . Smoking status: Former Smoker    Types: Cigarettes    Quit date: 08/21/1980    Years since quitting: 40.4  . Smokeless tobacco: Former Network engineer  . Vaping Use: Never used  Substance and Sexual Activity  . Alcohol use: No    Alcohol/week: 0.0 standard drinks  . Drug use: No  . Sexual activity: Yes    Birth control/protection: Post-menopausal  Other Topics Concern  . Not on file  Social History Narrative  . Not on file   Social Determinants of Health   Financial Resource Strain: Not on file  Food Insecurity: Not on file  Transportation Needs: Not on file  Physical Activity: Not on file  Stress: Not on file  Social  Connections: Not on file   Additional Social History:    Allergies:   Allergies  Allergen Reactions  . Aspirin Other (See Comments)    BLEEDING BEHIND EYES  . Penicillins Other (See Comments)    Unknown rxn DID THE REACTION INVOLVE: Swelling of the face/tongue/throat, SOB, or low BP? Unknown Sudden or severe rash/hives, skin peeling, or the inside of the mouth or nose? Unknown Did it require medical treatment? Unknown When did it last happen?Unknown If all above answers are "NO", may proceed with cephalosporin use.    Labs:  Results for orders placed or performed during the hospital encounter of 01/21/21 (from the past 48 hour(s))  CBG monitoring, ED     Status: Abnormal   Collection Time: 01/22/21 12:40 PM  Result Value Ref Range   Glucose-Capillary 242 (H) 70 - 99 mg/dL    Comment: Glucose reference range applies only to samples taken after fasting for at least 8 hours.  CBG monitoring, ED     Status: Abnormal   Collection Time: 01/22/21  5:58 PM  Result Value Ref Range   Glucose-Capillary 183 (H) 70 - 99 mg/dL    Comment: Glucose reference range applies only to samples taken after fasting for at least 8 hours.  CBG monitoring, ED     Status: Abnormal   Collection Time: 01/22/21 10:02 PM  Result Value Ref Range   Glucose-Capillary 272 (H) 70 - 99 mg/dL    Comment: Glucose reference range applies only to samples taken after fasting for at least 8 hours.  CBG monitoring, ED     Status: Abnormal   Collection Time: 01/23/21  7:57 AM  Result Value Ref Range   Glucose-Capillary 225 (H) 70 - 99 mg/dL    Comment: Glucose reference range applies only to samples taken after fasting for at least 8 hours.   Comment 1 Notify RN    Comment 2 Document in Chart   CBG monitoring, ED     Status: Abnormal   Collection Time: 01/23/21  4:48 PM  Result Value Ref Range   Glucose-Capillary 267 (H) 70 - 99 mg/dL    Comment: Glucose reference range applies only to samples taken after fasting  for at least 8 hours.  CBG monitoring,  ED     Status: Abnormal   Collection Time: 01/24/21  7:56 AM  Result Value Ref Range   Glucose-Capillary 189 (H) 70 - 99 mg/dL    Comment: Glucose reference range applies only to samples taken after fasting for at least 8 hours.    Medications:  Current Facility-Administered Medications  Medication Dose Route Frequency Provider Last Rate Last Admin  . hydrochlorothiazide (MICROZIDE) capsule 12.5 mg  12.5 mg Oral Daily Pearson Grippe, DO   12.5 mg at 01/22/21 0906  . insulin aspart (novoLOG) injection 0-15 Units  0-15 Units Subcutaneous TID WC Pearson Grippe, DO   8 Units at 01/23/21 1748  . QUEtiapine (SEROQUEL) tablet 12.5 mg  12.5 mg Oral BID Merlyn Lot E, NP      . rosuvastatin (CRESTOR) tablet 40 mg  40 mg Oral Daily Pearson Grippe, DO   40 mg at 01/22/21 4034   Current Outpatient Medications  Medication Sig Dispense Refill  . Insulin NPH, Human,, Isophane, (HUMULIN N KWIKPEN) 100 UNIT/ML Kiwkpen Inject 50 Units into the skin every morning. 30 mL 11  . allopurinol (ZYLOPRIM) 300 MG tablet Take 1 tablet (300 mg total) by mouth 2 (two) times daily. (Patient not taking: No sig reported) 90 tablet 3  . glucose blood (ONETOUCH VERIO) test strip 1 each by Other route 2 (two) times daily. And lancets 2/day 200 each 12  . hydrALAZINE (APRESOLINE) 25 MG tablet Take 1 tablet (25 mg total) by mouth every 8 (eight) hours. (Patient not taking: No sig reported) 90 tablet 3  . hydrochlorothiazide (MICROZIDE) 12.5 MG capsule Take 1 capsule (12.5 mg total) by mouth daily. (Patient not taking: No sig reported) 90 capsule 3  . Lancets (ONETOUCH DELICA PLUS [id number]) MISC Test bid    . rosuvastatin (CRESTOR) 40 MG tablet Take 1 tablet (40 mg total) by mouth daily. (Patient not taking: No sig reported) 90 tablet 3    Musculoskeletal: Strength & Muscle Tone: within normal limits Gait & Station: normal Patient leans: N/A   Psychiatric Specialty  Exam:  Presentation  General Appearance: Appropriate for Environment  Eye Contact:Fair  Speech:Clear and Coherent  Speech Volume:Normal  Handedness:Right   Mood and Affect  Mood:Euthymic  Affect:Congruent   Thought Process  Thought Processes:Coherent  Descriptions of Associations:Intact  Orientation:Full (Time, Place and Person)  Thought Content:WDL  History of Schizophrenia/Schizoaffective disorder:No  Duration of Psychotic Symptoms:Less than six months  Hallucinations:Hallucinations: None  Ideas of Reference:None  Suicidal Thoughts:Suicidal Thoughts: No  Homicidal Thoughts:Homicidal Thoughts: No   Sensorium  Memory:Immediate Fair; Recent Fair; Remote Poor  Judgment:Fair  Insight:Fair   Executive Functions  Concentration:Fair  Attention Span:Fair  [place]   Psychomotor Activity  Psychomotor Activity:Psychomotor Activity: Normal   Assets  Assets:Communication Skills; Housing; Leisure Time; Social Support; Physical Health   Sleep  Sleep:Sleep: Fair    Physical Exam: Physical Exam Constitutional:      Appearance: Normal appearance.  HENT:     Head: Normocephalic.  Cardiovascular:     Rate and Rhythm: Normal rate.  Musculoskeletal:        General: Normal range of motion.     Cervical back: Normal range of motion.  Neurological:     Mental Status: She is alert.  Psychiatric:        Attention and Perception: Attention and perception normal.        Mood and Affect: Mood normal.        Speech: Speech normal.  Behavior: Behavior normal. Behavior is cooperative.        Thought Content: Thought content normal.        Cognition and Memory: Cognition and memory normal.        Judgment: Judgment normal.    Review of Systems  Constitutional: Negative.   HENT: Negative.   Eyes: Negative.   Respiratory: Negative.   Cardiovascular: Negative.   Gastrointestinal: Negative.    Genitourinary: Negative.   Musculoskeletal: Negative.   Skin: Negative.   Neurological: Positive for dizziness.  Endo/Heme/Allergies: Negative.   Psychiatric/Behavioral: Negative for depression, hallucinations, memory loss and suicidal ideas. The patient is not nervous/anxious and does not have insomnia.    Blood pressure (!) 145/77, pulse 68, temperature 98.3 F (36.8 C), resp. rate 18, SpO2 100 %. There is no height or weight on file to calculate BMI.  Treatment Plan Summary: Plan Psychiatrically cleared    Social work consult ordered: patient family is requesting assisted living information.   Safety Plan 1. Tajia Szeliga will reach out to her daughter, granddaughter or church members, or call 911 or call mobile crisis if condition worsens or if suicidal thoughts become active 2. Patients' will follow up with outpatient psychiatric services provided. 3. The suicide prevention education provided includes the following: . Suicide risk factors . Suicide prevention and interventions . National Suicide Hotline telephone number . [place] assessment telephone number . [facility] Emergency Assistance 911 . County and/or Residential Mobile Crisis Unit telephone number 4. Request made of family/significant other to:  Sadric  . Remove weapons (e.g., guns, rifles, knives), all items previously/currently identified as safety concern.   o Remove drugs/medications (over the counter, prescriptions, illicit drugs), all items previously/currently identified as a safety concern.   Disposition: No evidence of imminent risk to self or others at present.   Patient does not meet criteria for psychiatric inpatient admission. Supportive therapy provided about ongoing stressors. Discussed crisis plan, support from social network, calling 911, coming to the Emergency Department, and calling Suicide Hotline.  This service was provided via telemedicine using a 2-way,  interactive audio and video technology.  Names of all persons participating in this telemedicine service and their role in this encounter. Name: [name]  Role: Patient   Name: Earleen Newport  Role: NP  Name: Darrol Angel Role: NP  Name:  Dwyane Dee  Role: Psychiatrist    Patient's nurse Gordan Payment informed of the disposition via secure chat.   Henson Fraticelli L, NP 01/24/2021 11:35 AM

## 2021-01-24 NOTE — Discharge Instructions (Addendum)
1.  Follow-up with your family doctor soon as possible for recheck.

## 2021-01-24 NOTE — Consult Note (Signed)
  Sent a secure message and spoke to patients nurse Gordan Payment) via telephone requesting set up machine for telepsych assessment.   Luellen Pucker informed in the middle of transfer at this time but would set up when she finished

## 2021-01-24 NOTE — Progress Notes (Signed)
CSW spoke with [facility], patients daughter and gave her information about assisting living. Ms. Dimitrov stated her sister is on her way to pick-up her mother from the hospital. Ms. Folz stated she would like to do a conference call with her sister so she doesn't miss any information. Ms. Vajda plans to call CSW back.

## 2021-01-24 NOTE — Progress Notes (Signed)
Inpatient Diabetes Program Recommendations  AACE/ADA: New Consensus Statement on Inpatient Glycemic Control (2015)  Target Ranges:  Prepandial:   less than 140 mg/dL      Peak postprandial:   less than 180 mg/dL (1-2 hours)      Critically ill patients:  140 - 180 mg/dL   Lab Results  Component Value Date   GLUCAP 189 (H) 01/24/2021   HGBA1C 9.2 (H) 01/21/2021    Review of Glycemic Control Results for [facility], [facility] (MRN [id number]) as of 01/24/2021 12:16  Ref. Range 01/22/2021 22:02 01/23/2021 07:57 01/23/2021 16:48 01/24/2021 07:56  Glucose-Capillary Latest Ref Range: 70 - 99 mg/dL 272 (H) 225 (H) 267 (H) 189 (H)   Diabetes history: Type 2 DM Outpatient Diabetes medications: NPH 50 units QAM Current orders for Inpatient glycemic control: Novolog 0-15 units TID  Inpatient Diabetes Program Recommendations:    Consider adding Levemir 8 units QD.   Thanks, Bronson Curb, MSN, RNC-OB Diabetes Coordinator [phone] (8a-5p)

## 2021-01-24 NOTE — ED Notes (Signed)
Patient was given a Media planner and Drink.

## 2021-01-24 NOTE — ED Notes (Signed)
Pt sleeping with lights on at this time. Pt up talking to staff for part of the night, ambulating to restroom independently.

## 2021-01-24 NOTE — ED Notes (Signed)
Pt awake, calm and cooperative, but confused. Up to restroom, now back in room.

## 2021-01-24 NOTE — ED Notes (Signed)
Attempted to call SO . No answer at [phone].

## 2021-01-28 ENCOUNTER — Other Ambulatory Visit: Payer: Self-pay

## 2021-01-28 ENCOUNTER — Inpatient Hospital Stay: Payer: Medicare Other | Admitting: Internal Medicine

## 2021-01-31 ENCOUNTER — Telehealth: Payer: Self-pay | Admitting: Internal Medicine

## 2021-01-31 NOTE — Telephone Encounter (Signed)
   Daughter Lavella Lemons calling for advice for patient, She has questions regarding FL2. She states her mother needs assistance in the home or needs to go to a facility.   Seeking advice, please call

## 2021-02-01 ENCOUNTER — Telehealth: Payer: Self-pay

## 2021-02-01 NOTE — Telephone Encounter (Signed)
Patients daughter tanya came in with out the patient a few days go ago and she was advise the appointment would be cancelled with out the patient being present.  Patient daughter called a about seeking advice on an FL2 and how to get the process started. Daughter was told that we would need an office visit.

## 2021-02-01 NOTE — Telephone Encounter (Signed)
Patient has been scheduled for a video per the agreement of dr. Jenny Reichmann and patients daughter.

## 2021-02-03 NOTE — Telephone Encounter (Signed)
please return call to daughter, she has questions regarding 6/17 appointment

## 2021-02-04 ENCOUNTER — Telehealth (INDEPENDENT_AMBULATORY_CARE_PROVIDER_SITE_OTHER): Payer: Medicare Other | Admitting: Internal Medicine

## 2021-02-04 ENCOUNTER — Telehealth: Payer: Self-pay | Admitting: Internal Medicine

## 2021-02-04 DIAGNOSIS — I10 Essential (primary) hypertension: Secondary | ICD-10-CM

## 2021-02-04 DIAGNOSIS — N1831 Chronic kidney disease, stage 3a: Secondary | ICD-10-CM | POA: Diagnosis not present

## 2021-02-04 DIAGNOSIS — F0391 Unspecified dementia with behavioral disturbance: Secondary | ICD-10-CM

## 2021-02-04 DIAGNOSIS — F03918 Unspecified dementia, unspecified severity, with other behavioral disturbance: Secondary | ICD-10-CM

## 2021-02-04 MED ORDER — ROSUVASTATIN CALCIUM 40 MG PO TABS: 40.0000 mg | ORAL_TABLET | Freq: Every day | ORAL | 3 refills | Status: DC

## 2021-02-04 MED ORDER — HYDROCHLOROTHIAZIDE 12.5 MG PO CAPS: 12.5000 mg | ORAL_CAPSULE | Freq: Every day | ORAL | 3 refills | Status: DC

## 2021-02-04 MED ORDER — ALLOPURINOL 300 MG PO TABS: 300.0000 mg | ORAL_TABLET | Freq: Two times a day (BID) | ORAL | 3 refills | Status: DC

## 2021-02-04 MED ORDER — HYDRALAZINE HCL 25 MG PO TABS: 25.0000 mg | ORAL_TABLET | Freq: Three times a day (TID) | ORAL | 3 refills | Status: DC

## 2021-02-04 NOTE — Telephone Encounter (Signed)
Type of form received: FL2  Received by:  Amy   Is patient requesting call for pickup: [phone] Lavella Lemons   Form placed in the Provider's box.  Was patient informed of  7-10 business day turn around (Y/N)? Y    Additional comments:  Tanya asked that during the video call it not be mention assisted living or nursing home as it upsets her mom  Also, do not mention that the daughter will pick up the forms, as it will upset her mom  In addition, there are for the assisted living admission, the daughter needs to confirm negative TB skin test and covid vaccine cards and copy of negative results--daughter also states not to mention this on the call, but provide there information at time of pick-up  Forms placed in provider's box

## 2021-02-04 NOTE — Progress Notes (Signed)
Patient ID: Gail Lynch, female   DOB: May 30, 1941, 80 y.o.   MRN: [id number]  Virtual Visit via Video Note  I connected with Gail Lynch on 02/04/21 at  1:40 PM EDT by a video enabled telemedicine application and verified that I am speaking with the correct person using two identifiers.  Location of all participants today Patient: at home Provider: at office   I discussed the limitations of evaluation and management by telemedicine and the availability of in person appointments. The patient expressed understanding and agreed to proceed.  History of Present Illness: Here to f/u with daughter, overall doing ok after 5 days psychiatric hospn.  Denies worsening depressive symptoms, suicidal ideation, or panic, and not assoc with behavioral changes such as hallucinations, paranoia, or agitation.   Good compliance with meds.   Pt denies polydipsia, polyuria, or new foal neuro s/s.  Bp at home has been mostly sbp 120 and 130's.  No other new complaints Past Medical History:  Diagnosis Date   Acute lower GI bleeding 05/03/2020   Arthritis    gout in feet   Asthma    hx as a child - no inhaler, no problems   Carotid stenosis 06/18/2012   Colon polyps    Diabetes mellitus    Type 2   Dysplasia of cervix, low grade (CIN 1)    s/p cervical conization   Glaucoma    Glaucoma 12/18/2011   Hard of hearing    hear aids - left   HPV (human papilloma virus) infection    Hyperlipidemia    Hypertension    Intrinsic asthma, unspecified 12/18/2011   Mild aortic stenosis 03/14/2017   Mycotic toenails 02/19/2013   PONV (postoperative nausea and vomiting)    SVD (spontaneous vaginal delivery)    x 4   Past Surgical History:  Procedure Laterality Date   BREAST BIOPSY  2010   BREAST BIOPSY  1991   COLONOSCOPY     COLONOSCOPY WITH PROPOFOL N/A 05/06/2020   Procedure: COLONOSCOPY WITH PROPOFOL;  Surgeon: Jerene Bears, MD;  Location: [facility];  Service: Endoscopy;  Laterality: N/A;   DILATION AND  CURETTAGE OF UTERUS  2000   HYSTEROSCOPY WITH D & C N/A 03/04/2014   Procedure: DILATATION AND CURETTAGE /HYSTEROSCOPY;  Surgeon: Lavonia Drafts, MD;  Location: [facility];  Service: Gynecology;  Laterality: N/A;   KNEE SURGERY  2010   patella fracture   MULTIPLE TOOTH EXTRACTIONS     PACEMAKER IMPLANT N/A 03/09/2020   Procedure: PACEMAKER IMPLANT;  Surgeon: Constance Haw, MD;  Location: [facility];  Service: Cardiovascular;  Laterality: N/A;   POLYPECTOMY     POLYPECTOMY  05/06/2020   Procedure: POLYPECTOMY;  Surgeon: Jerene Bears, MD;  Location: [facility];  Service: Endoscopy;;    reports that she quit smoking about 40 years ago. Her smoking use included cigarettes. She has quit using smokeless tobacco. She reports that she does not drink alcohol and does not use drugs. family history includes Asthma in an other family member; Diabetes in her brother; Heart disease in her brother, father, and mother. Allergies  Allergen Reactions   Aspirin Other (See Comments)    BLEEDING BEHIND EYES   Penicillins Other (See Comments)    Unknown rxn DID THE REACTION INVOLVE: Swelling of the face/tongue/throat, SOB, or low BP? Unknown Sudden or severe rash/hives, skin peeling, or the inside of the mouth or nose? Unknown Did it require medical treatment? Unknown When did it last happen? Unknown  If all above answers are "NO", may proceed with cephalosporin use.   Current Outpatient Medications on File Prior to Visit  Medication Sig Dispense Refill   glucose blood (ONETOUCH VERIO) test strip 1 each by Other route 2 (two) times daily. And lancets 2/day 200 each 12   Insulin NPH, Human,, Isophane, (HUMULIN N KWIKPEN) 100 UNIT/ML Kiwkpen Inject 50 Units into the skin every morning. 30 mL 11   Lancets (ONETOUCH DELICA PLUS [id number]) MISC Test bid     No current facility-administered medications on file prior to visit.    Observations/Objective: Alert, NAD, appropriate mood and  affect, resps normal, cn 2-12 intact, moves all 4s, no visible rash or swelling Lab Results  Component Value Date   WBC 5.7 01/21/2021   HGB 11.6 (L) 01/21/2021   HCT 36.4 01/21/2021   PLT 227 01/21/2021   GLUCOSE 258 (H) 01/21/2021   CHOL 222 (H) 07/29/2020   TRIG 145.0 07/29/2020   HDL 48.20 07/29/2020   LDLDIRECT 152.9 12/18/2011   LDLCALC 145 (H) 07/29/2020   ALT 10 01/21/2021   AST 17 01/21/2021   NA 136 01/21/2021   K 3.9 01/21/2021   CL 103 01/21/2021   CREATININE 1.65 (H) 01/21/2021   BUN 28 (H) 01/21/2021   CO2 24 01/21/2021   TSH 2.08 07/29/2020   INR 1.1 (H) 09/08/2013   HGBA1C 9.2 (H) 01/21/2021   MICROALBUR 2.6 (H) 11/21/2017   Assessment and Plan: See notes  Follow Up Instructions: See notes   I discussed the assessment and treatment plan with the patient. The patient was provided an opportunity to ask questions and all were answered. The patient agreed with the plan and demonstrated an understanding of the instructions.   The patient was advised to call back or seek an in-person evaluation if the symptoms worsen or if the condition fails to improve as anticipated.   Cathlean Cower, MD

## 2021-02-07 ENCOUNTER — Encounter: Payer: Self-pay | Admitting: Internal Medicine

## 2021-02-07 NOTE — Assessment & Plan Note (Signed)
BP Readings from Last 3 Encounters:  01/24/21 127/73  08/03/20 (!) 178/90  07/29/20 (!) 150/78   Stable, pt to continue medical treatment hct, hydralazine

## 2021-02-07 NOTE — Assessment & Plan Note (Signed)
Lab Results  Component Value Date   CREATININE 1.65 (H) 01/21/2021   Stable overall, cont to avoid nephrotoxins

## 2021-02-07 NOTE — Assessment & Plan Note (Signed)
Improved, stable, cont current med tx,  to f/u any worsening symptoms or concerns, FL2 form filled out for placement

## 2021-02-07 NOTE — Patient Instructions (Signed)
Please continue all other medications as before, and refills have been done if requested.  Please have the pharmacy call with any other refills you may need.  Please continue your efforts at being more active, low cholesterol diet, and weight control.  Please keep your appointments with your specialists as you may have planned  The FL2 form will be filled out

## 2021-02-17 ENCOUNTER — Ambulatory Visit: Payer: Medicare Other | Admitting: Endocrinology

## 2021-03-10 ENCOUNTER — Ambulatory Visit (INDEPENDENT_AMBULATORY_CARE_PROVIDER_SITE_OTHER): Payer: Medicare Other

## 2021-03-10 DIAGNOSIS — I442 Atrioventricular block, complete: Secondary | ICD-10-CM

## 2021-03-10 LAB — CUP PACEART REMOTE DEVICE CHECK
Battery Remaining Longevity: 112 mo
Battery Voltage: 3.02 V
Brady Statistic AP VP Percent: 10.62 %
Brady Statistic AP VS Percent: 0 %
Brady Statistic AS VP Percent: 89.34 %
Brady Statistic AS VS Percent: 0.04 %
Brady Statistic RA Percent Paced: 10.43 %
Brady Statistic RV Percent Paced: 99.96 %
Date Time Interrogation Session: 20220721041705
Implantable Lead Implant Date: 20210720
Implantable Lead Implant Date: 20210720
Implantable Lead Location: 753859
Implantable Lead Location: 753860
Implantable Lead Model: 5076
Implantable Lead Model: 5076
Implantable Pulse Generator Implant Date: 20210720
Lead Channel Impedance Value: 285 Ohm
Lead Channel Impedance Value: 304 Ohm
Lead Channel Impedance Value: 323 Ohm
Lead Channel Impedance Value: 380 Ohm
Lead Channel Pacing Threshold Amplitude: 0.625 V
Lead Channel Pacing Threshold Amplitude: 0.625 V
Lead Channel Pacing Threshold Pulse Width: 0.4 ms
Lead Channel Pacing Threshold Pulse Width: 0.4 ms
Lead Channel Sensing Intrinsic Amplitude: 1.25 mV
Lead Channel Sensing Intrinsic Amplitude: 1.25 mV
Lead Channel Sensing Intrinsic Amplitude: 2 mV
Lead Channel Sensing Intrinsic Amplitude: 3.625 mV
Lead Channel Setting Pacing Amplitude: 1.5 V
Lead Channel Setting Pacing Amplitude: 2.5 V
Lead Channel Setting Pacing Pulse Width: 0.4 ms
Lead Channel Setting Sensing Sensitivity: 1.2 mV

## 2021-03-14 ENCOUNTER — Telehealth: Payer: Self-pay

## 2021-03-14 NOTE — Telephone Encounter (Signed)
pts daughter has called in regards to getting transportation service set up for her mother to be transported to a facility in which she will reside. Pts daughter stated this is in regards to the [facility] form that was filled out by Dr. Jenny Reichmann and needs to speak with him or his Assistant about this recommendation.  **Pts daughter can reached at [phone]

## 2021-03-15 MED ORDER — DIAZEPAM 5 MG PO TABS: 5.0000 mg | ORAL_TABLET | Freq: Four times a day (QID) | ORAL | 0 refills | Status: DC | PRN

## 2021-03-15 NOTE — Telephone Encounter (Signed)
Barbara from [facility] is asking for [facility] to be corrected Within the ALF they have a memory care unit where the patient will be  Also asking for : covid19 vaccination information  Order for Libertarian diet And appropriate boxes be checked for "wandering"  Pamala Hurry [phone] Fax # [phone]

## 2021-03-15 NOTE — Telephone Encounter (Signed)
Patient's daughter has been notified of requested medications being sent.

## 2021-03-15 NOTE — Telephone Encounter (Signed)
Ok to f/u with pt concern, though I am not sure we have much to help with transportation issue

## 2021-03-15 NOTE — Telephone Encounter (Signed)
I think from this onte that the daughter is asking for a one time dose of a sedative to assist in patient moving to a new environment  [place] for valium 5 mg po x 1

## 2021-03-15 NOTE — Telephone Encounter (Signed)
Ok for correction and info to be given thanks

## 2021-03-21 NOTE — Telephone Encounter (Signed)
Documents have been faxed [phone]

## 2021-03-31 NOTE — Progress Notes (Signed)
Remote pacemaker transmission.   

## 2021-04-07 ENCOUNTER — Telehealth: Payer: Self-pay | Admitting: Lab

## 2021-04-07 NOTE — Chronic Care Management (AMB) (Signed)
  Chronic Care Management   Note  04/07/2021 Name: Gail Lynch MRN: [id number] DOB: July 19, 1941  Gail Lynch is a 80 y.o. year old female who is a primary care patient of Jenny Reichmann, Hunt Oris, MD. I reached out to Gail Lynch by phone today in response to a referral sent by Gail Lynch PCP, Biagio Borg, MD.   Gail Lynch was given information about Chronic Care Management services today including:  CCM service includes personalized support from designated clinical staff supervised by her physician, including individualized plan of care and coordination with other care providers 24/7 contact phone numbers for assistance for urgent and routine care needs. Service will only be billed when office clinical staff spend 20 minutes or more in a month to coordinate care. Only one practitioner may furnish and bill the service in a calendar month. The patient may stop CCM services at any time (effective at the end of the month) by phone call to the office staff.   Patient agreed to services and verbal consent obtained.   Follow up plan:   [place]

## 2021-04-21 ENCOUNTER — Encounter: Payer: Medicare Other | Admitting: Cardiology

## 2021-04-21 NOTE — Progress Notes (Deleted)
Electrophysiology Office Note   Date:  04/21/2021   ID:  Gail Lynch, DOB 15-Sep-1940, MRN [id number]  PCP:  Biagio Borg, MD  Cardiologist:  Primary Electrophysiologist:  Hemi Chacko Meredith Leeds, MD    Chief Complaint: Pacemaker   History of Present Illness: Gail Lynch is a 80 y.o. female who is being seen today for the evaluation of pacemaker at the request of Biagio Borg, MD. Presenting today for electrophysiology evaluation.  She has a history significant for type 2 diabetes, hypertension, and hyperlipidemia.  She presented to the hospital after being seen at her primary physician's office with complete heart block.  She has now status post Medtronic dual-chamber pacemaker implanted 03/09/2020.  Today, denies symptoms of palpitations, chest pain, shortness of breath, orthopnea, PND, lower extremity edema, claudication, dizziness, presyncope, syncope, bleeding, or neurologic sequela. The patient is tolerating medications without difficulties. ***    Past Medical History:  Diagnosis Date   Acute lower GI bleeding 05/03/2020   Arthritis    gout in feet   Asthma    hx as a child - no inhaler, no problems   Carotid stenosis 06/18/2012   Colon polyps    Diabetes mellitus    Type 2   Dysplasia of cervix, low grade (CIN 1)    s/p cervical conization   Glaucoma    Glaucoma 12/18/2011   Hard of hearing    hear aids - left   HPV (human papilloma virus) infection    Hyperlipidemia    Hypertension    Intrinsic asthma, unspecified 12/18/2011   Mild aortic stenosis 03/14/2017   Mycotic toenails 02/19/2013   PONV (postoperative nausea and vomiting)    SVD (spontaneous vaginal delivery)    x 4   Past Surgical History:  Procedure Laterality Date   BREAST BIOPSY  2010   BREAST BIOPSY  1991   COLONOSCOPY     COLONOSCOPY WITH PROPOFOL N/A 05/06/2020   Procedure: COLONOSCOPY WITH PROPOFOL;  Surgeon: Jerene Bears, MD;  Location: [facility];  Service: Endoscopy;  Laterality: N/A;    DILATION AND CURETTAGE OF UTERUS  2000   HYSTEROSCOPY WITH D & C N/A 03/04/2014   Procedure: DILATATION AND CURETTAGE /HYSTEROSCOPY;  Surgeon: Lavonia Drafts, MD;  Location: [facility];  Service: Gynecology;  Laterality: N/A;   KNEE SURGERY  2010   patella fracture   MULTIPLE TOOTH EXTRACTIONS     PACEMAKER IMPLANT N/A 03/09/2020   Procedure: PACEMAKER IMPLANT;  Surgeon: Constance Haw, MD;  Location: [facility];  Service: Cardiovascular;  Laterality: N/A;   POLYPECTOMY     POLYPECTOMY  05/06/2020   Procedure: POLYPECTOMY;  Surgeon: Jerene Bears, MD;  Location: [facility];  Service: Endoscopy;;     Current Outpatient Medications  Medication Sig Dispense Refill   allopurinol (ZYLOPRIM) 300 MG tablet Take 1 tablet (300 mg total) by mouth 2 (two) times daily. 90 tablet 3   diazepam (VALIUM) 5 MG tablet Take 1 tablet (5 mg total) by mouth every 6 (six) hours as needed for anxiety. 1 tablet 0   glucose blood (ONETOUCH VERIO) test strip 1 each by Other route 2 (two) times daily. And lancets 2/day 200 each 12   hydrALAZINE (APRESOLINE) 25 MG tablet Take 1 tablet (25 mg total) by mouth every 8 (eight) hours. 90 tablet 3   hydrochlorothiazide (MICROZIDE) 12.5 MG capsule Take 1 capsule (12.5 mg total) by mouth daily. 90 capsule 3   Insulin NPH, Human,, Isophane, (HUMULIN N KWIKPEN)  100 UNIT/ML Kiwkpen Inject 50 Units into the skin every morning. 30 mL 11   Lancets (ONETOUCH DELICA PLUS [id number]) MISC Test bid     rosuvastatin (CRESTOR) 40 MG tablet Take 1 tablet (40 mg total) by mouth daily. 90 tablet 3   No current facility-administered medications for this visit.    Allergies:   Aspirin and Penicillins   Social History:  The patient  reports that she quit smoking about 40 years ago. Her smoking use included cigarettes. She has quit using smokeless tobacco. She reports that she does not drink alcohol and does not use drugs.   Family History:  The patient's family history  includes Asthma in an other family member; Diabetes in her brother; Heart disease in her brother, father, and mother.   ROS:  Please see the history of present illness.   Otherwise, review of systems is positive for none.   All other systems are reviewed and negative.   PHYSICAL EXAM: VS:  There were no vitals taken for this visit. , BMI There is no height or weight on file to calculate BMI. GEN: Well nourished, well developed, in no acute distress  HEENT: normal  Neck: no JVD, carotid bruits, or masses Cardiac: ***RRR; no murmurs, rubs, or gallops,no edema  Respiratory:  clear to auscultation bilaterally, normal work of breathing GI: soft, nontender, nondistended, + BS MS: no deformity or atrophy  Skin: warm and dry, device site well healed Neuro:  Strength and sensation are intact Psych: euthymic mood, full affect  EKG:  EKG {ACTION; IS/IS [id number] ordered today. Personal review of the ekg ordered *** shows ***  Personal review of the device interrogation today. Results in [place]: 07/29/2020: TSH 2.08 01/21/2021: ALT 10; BUN 28; Creatinine, Ser 1.65; Hemoglobin 11.6; Platelets 227; Potassium 3.9; Sodium 136    Lipid Panel     Component Value Date/Time   CHOL 222 (H) 07/29/2020 1041   TRIG 145.0 07/29/2020 1041   HDL 48.20 07/29/2020 1041   CHOLHDL 5 07/29/2020 1041   VLDL 29.0 07/29/2020 1041   LDLCALC 145 (H) 07/29/2020 1041   LDLDIRECT 152.9 12/18/2011 1405     Wt Readings from Last 3 Encounters:  08/03/20 176 lb 9.6 oz (80.1 kg)  07/29/20 174 lb (78.9 kg)  06/21/20 187 lb (84.8 kg)      Other studies Reviewed: Additional studies/ records that were reviewed today include: TTE 03/09/2020 Review of the above records today demonstrates:   1. Left ventricular ejection fraction, by estimation, is 60 to 65%. The  left ventricle has normal function. Left ventricular diastolic function  could not be evaluated.   2. Right ventricular systolic function  is normal. The right ventricular  size is normal. Tricuspid regurgitation signal is inadequate for assessing  PA pressure.   3. Left atrial size was mild to moderately dilated.   4. The mitral valve is degenerative. Trivial mitral valve regurgitation.  No evidence of mitral stenosis.   5. The aortic valve is tricuspid. Aortic valve regurgitation is not  visualized. Mild aortic valve stenosis. Aortic valve area, by VTI measures  1.79 cm. Aortic valve mean gradient measures 4.6 mmHg. Aortic valve Vmax  measures 1.59 m/s.   6. The inferior vena cava is dilated in size with >50% respiratory  variability, suggesting right atrial pressure of 8 mmHg.    ASSESSMENT AND PLAN:  1.  Complete heart block: Status post Medtronic dual-chamber pacemaker implanted 03/09/2020.  Device functioning appropriately.  No  changes at this time.  2.  Hypertension:***  Current medicines are reviewed at length with the patient today.   The patient does not have concerns regarding her medicines.  The following changes were made today:  ***  Labs/ tests ordered today include:  No orders of the defined types were placed in this encounter.    Disposition:   FU with Alick Lecomte *** months  Signed, Zhane Bluitt Meredith Leeds, MD  04/21/2021 8:35 AM     [facility] [address] [place] [address] [phone] (office) [phone] (fax)

## 2021-04-22 ENCOUNTER — Other Ambulatory Visit: Payer: Self-pay

## 2021-04-22 ENCOUNTER — Ambulatory Visit: Payer: Medicare Other | Admitting: Podiatry

## 2021-04-22 ENCOUNTER — Telehealth: Payer: Self-pay | Admitting: Internal Medicine

## 2021-04-22 ENCOUNTER — Encounter (HOSPITAL_COMMUNITY): Payer: Self-pay

## 2021-04-22 ENCOUNTER — Emergency Department (HOSPITAL_COMMUNITY)
Admission: EM | Admit: 2021-04-22 | Discharge: 2021-04-22 | Disposition: A | Discharge status: Home / Self Care | Payer: Medicare Other | Attending: Emergency Medicine | Admitting: Emergency Medicine

## 2021-04-22 DIAGNOSIS — F0390 Unspecified dementia without behavioral disturbance: Secondary | ICD-10-CM | POA: Diagnosis not present

## 2021-04-22 DIAGNOSIS — N183 Chronic kidney disease, stage 3 unspecified: Secondary | ICD-10-CM | POA: Diagnosis not present

## 2021-04-22 DIAGNOSIS — R404 Transient alteration of awareness: Secondary | ICD-10-CM | POA: Diagnosis not present

## 2021-04-22 DIAGNOSIS — R443 Hallucinations, unspecified: Secondary | ICD-10-CM | POA: Insufficient documentation

## 2021-04-22 DIAGNOSIS — E1165 Type 2 diabetes mellitus with hyperglycemia: Secondary | ICD-10-CM | POA: Diagnosis not present

## 2021-04-22 DIAGNOSIS — Z743 Need for continuous supervision: Secondary | ICD-10-CM | POA: Diagnosis not present

## 2021-04-22 DIAGNOSIS — Z950 Presence of cardiac pacemaker: Secondary | ICD-10-CM | POA: Diagnosis not present

## 2021-04-22 DIAGNOSIS — E1122 Type 2 diabetes mellitus with diabetic chronic kidney disease: Secondary | ICD-10-CM | POA: Diagnosis not present

## 2021-04-22 DIAGNOSIS — R739 Hyperglycemia, unspecified: Secondary | ICD-10-CM | POA: Diagnosis not present

## 2021-04-22 DIAGNOSIS — Z794 Long term (current) use of insulin: Secondary | ICD-10-CM | POA: Diagnosis not present

## 2021-04-22 DIAGNOSIS — Z87891 Personal history of nicotine dependence: Secondary | ICD-10-CM | POA: Insufficient documentation

## 2021-04-22 DIAGNOSIS — Z79899 Other long term (current) drug therapy: Secondary | ICD-10-CM | POA: Diagnosis not present

## 2021-04-22 DIAGNOSIS — J45909 Unspecified asthma, uncomplicated: Secondary | ICD-10-CM | POA: Diagnosis not present

## 2021-04-22 DIAGNOSIS — R410 Disorientation, unspecified: Secondary | ICD-10-CM | POA: Diagnosis present

## 2021-04-22 DIAGNOSIS — I129 Hypertensive chronic kidney disease with stage 1 through stage 4 chronic kidney disease, or unspecified chronic kidney disease: Secondary | ICD-10-CM | POA: Diagnosis not present

## 2021-04-22 DIAGNOSIS — I10 Essential (primary) hypertension: Secondary | ICD-10-CM | POA: Diagnosis not present

## 2021-04-22 LAB — BASIC METABOLIC PANEL
Anion gap: 9 (ref 5–15)
BUN: 24 mg/dL — ABNORMAL HIGH (ref 8–23)
CO2: 24 mmol/L (ref 22–32)
Calcium: 9.1 mg/dL (ref 8.9–10.3)
Chloride: 105 mmol/L (ref 98–111)
Creatinine, Ser: 1.46 mg/dL — ABNORMAL HIGH (ref 0.44–1.00)
GFR, Estimated: 36 mL/min — ABNORMAL LOW (ref >60)
Glucose, Bld: 271 mg/dL — ABNORMAL HIGH (ref 70–99)
Potassium: 4.1 mmol/L (ref 3.5–5.1)
Sodium: 138 mmol/L (ref 135–145)

## 2021-04-22 LAB — CBC WITH DIFFERENTIAL/PLATELET
Abs Immature Granulocytes: 0.02 10*3/uL (ref 0.00–0.07)
Basophils Absolute: 0.1 10*3/uL (ref 0.0–0.1)
Basophils Relative: 1 %
Eosinophils Absolute: 0.2 10*3/uL (ref 0.0–0.5)
Eosinophils Relative: 4 %
HCT: 36.5 % (ref 36.0–46.0)
Hemoglobin: 11.4 g/dL — ABNORMAL LOW (ref 12.0–15.0)
Immature Granulocytes: 0 %
Lymphocytes Relative: 22 %
Lymphs Abs: 1.1 10*3/uL (ref 0.7–4.0)
MCH: 27.8 pg (ref 26.0–34.0)
MCHC: 31.2 g/dL (ref 30.0–36.0)
MCV: 89 fL (ref 80.0–100.0)
Monocytes Absolute: 0.6 10*3/uL (ref 0.1–1.0)
Monocytes Relative: 12 %
Neutro Abs: 3.1 10*3/uL (ref 1.7–7.7)
Neutrophils Relative %: 61 %
Platelets: 298 10*3/uL (ref 150–400)
RBC: 4.1 MIL/uL (ref 3.87–5.11)
RDW: 13.3 % (ref 11.5–15.5)
WBC: 5.2 10*3/uL (ref 4.0–10.5)
nRBC: 0 % (ref 0.0–0.2)

## 2021-04-22 LAB — TROPONIN I (HIGH SENSITIVITY): Troponin I (High Sensitivity): 13 ng/L (ref <18)

## 2021-04-22 LAB — CBG MONITORING, ED: Glucose-Capillary: 273 mg/dL — ABNORMAL HIGH (ref 70–99)

## 2021-04-22 LAB — AMMONIA: Ammonia: 15 umol/L (ref 9–35)

## 2021-04-22 NOTE — Progress Notes (Signed)
CSW spoke with patients daughter and instructed her to go through patients PCP to obtain FL2. Patients FL2 expired this past Wednesday 04/20/21. Patients doctor has written the family a FL2 in June and July of this year. The FL2 is good for 30 days. Patients daughter stated she has only tried to get her mother into one place so far and that's [facility]. Patients daughter stated she needed a TB test and for her mother to be vaccinated. CSW spoke with the daughter with EDP and explained that her mother is medically cleared and cannot board in the ED for placement. CSW also stated the ED no longer gives out the COVID vaccine. TB test was order and patients daughter was told to follow-up with PCP or a pharmacy to get her mother facility. Patients daughter lives in [place] and stated she would come pick up her mother from the ED.

## 2021-04-22 NOTE — Telephone Encounter (Signed)
Daughter Lavella Lemons calling to request updated FL2 to provide [facility], attn Ms. Woolard   Please call daughter
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
## 2021-04-22 NOTE — Discharge Instructions (Addendum)
To family of Gail Lynch,  Please follow-up with your primary care doctor this week or soon as possible.  The doctor should be able to help with further steps for placement.  We do not place patients into memory care or nursing homes from the emergency department.  I did place a consult with our case manager, who may reach out to you about a home health aid visiting your mother during the week.

## 2021-04-22 NOTE — ED Notes (Signed)
Unable to get pt to sign MSE due to intermittent confusion. No family present

## 2021-04-22 NOTE — ED Provider Notes (Signed)
[facility] EMERGENCY DEPARTMENT Provider Note   CSN: [id number] Arrival date & time: 04/22/21  0947     History CC: Family concerns   Gail Lynch is a 80 y.o. female with a history of insulin-dependent diabetes presenting to emergency department with family concerns for confusion.  EMS reports that the patient's daughter Kenney Houseman who is her medical decision-maker and power of attorney has been concerned that the patient has been expressing worsening episodes of paranoia, particularly at night, he may be developing dementia and having difficulty caring for herself.  The patient lives independently.  On my examination the patient has a pleasant demeanor.  She reports to me that she has been taking care of her self her whole life and that she does not feel she needs any additional assistance.  She is upset with her daughter for having her brought to the emergency room.  She reports that she does take insulin at night, 45 to 50 units, which is consistent with her current recommended long-term insulin.  The patient has no other complaints, denies headache, chest pain, shortness of breath.  Per medical record review, the patient was seen in the emergency department for behavioral issues and concerns for hallucinations in June, had a psychiatry evaluation at the time, and was psychiatrically cleared and recommended for social work to assist with placement options.  I spoke to her daughter Kenney Houseman by phone, reports that her mother's been living alone, but has been having worsening dementia, hallucinations, confusion for the past several months.  After visit to the ED in June, the daughter has been trying to get her placed at a memory care facility, but having difficulties with the mother's insurance as well as FL 2 forms expiring.  Her daughter reports "she cannot live on her own anymore, I will do whatever I need to to help get her placed."  HPI     Past Medical History:  Diagnosis  Date   Acute lower GI bleeding 05/03/2020   Arthritis    gout in feet   Asthma    hx as a child - no inhaler, no problems   Carotid stenosis 06/18/2012   Colon polyps    Diabetes mellitus    Type 2   Dysplasia of cervix, low grade (CIN 1)    s/p cervical conization   Glaucoma    Glaucoma 12/18/2011   Hard of hearing    hear aids - left   HPV (human papilloma virus) infection    Hyperlipidemia    Hypertension    Intrinsic asthma, unspecified 12/18/2011   Mild aortic stenosis 03/14/2017   Mycotic toenails 02/19/2013   PONV (postoperative nausea and vomiting)    SVD (spontaneous vaginal delivery)    x 4    Patient Active Problem List   Diagnosis Date Noted   Non compliance with medical treatment 07/31/2020   Psychosis in elderly ([place]) 07/29/2020   Diabetic leg ulcer ([place]) 07/29/2020   Benign neoplasm of ascending colon    Benign neoplasm of transverse colon    Benign neoplasm of descending colon    Benign neoplasm of sigmoid colon    History of colon polyps 05/04/2020   Complete AV block ([place]) 03/09/2020   Mild aortic stenosis 03/14/2017   Peripheral edema 03/14/2017   Arm heaviness 03/14/2017   CKD (chronic kidney disease), stage III ([place]) 05/20/2016   Diabetes ([place]) 01/03/2016   Dysuria 12/09/2014   Peripheral neuropathy 12/05/2013   Irregular heart beat 12/05/2013   PVC (  premature ventricular contraction) 12/05/2013   Sciatica 06/17/2013   Pain due to onychomycosis of toenail 05/21/2013   Mycotic toenails    Vagina bleeding 08/06/2012   Carotid stenosis 06/18/2012   Constipation 04/30/2012   Childhood asthma 12/18/2011   Glaucoma 12/18/2011   Hyperlipidemia 12/18/2011   Colon polyps 12/18/2011   Preventative health care 12/17/2011   Hypertension 12/17/2011   Hard of hearing    Dysplasia of cervix, low grade (CIN 1)    HPV (human papilloma virus) infection    Brain mass 09/18/2011    Past Surgical History:  Procedure Laterality Date   BREAST BIOPSY  2010    BREAST BIOPSY  1991   COLONOSCOPY     COLONOSCOPY WITH PROPOFOL N/A 05/06/2020   Procedure: COLONOSCOPY WITH PROPOFOL;  Surgeon: Jerene Bears, MD;  Location: [facility];  Service: Endoscopy;  Laterality: N/A;   DILATION AND CURETTAGE OF UTERUS  2000   HYSTEROSCOPY WITH D & C N/A 03/04/2014   Procedure: DILATATION AND CURETTAGE /HYSTEROSCOPY;  Surgeon: Lavonia Drafts, MD;  Location: [facility];  Service: Gynecology;  Laterality: N/A;   KNEE SURGERY  2010   patella fracture   MULTIPLE TOOTH EXTRACTIONS     PACEMAKER IMPLANT N/A 03/09/2020   Procedure: PACEMAKER IMPLANT;  Surgeon: Constance Haw, MD;  Location: [facility];  Service: Cardiovascular;  Laterality: N/A;   POLYPECTOMY     POLYPECTOMY  05/06/2020   Procedure: POLYPECTOMY;  Surgeon: Jerene Bears, MD;  Location: [facility];  Service: Endoscopy;;     OB History     Gravida  3   Para  3   Term  3   Preterm  0   AB  0   Living  3      SAB  0   IAB  0   Ectopic  0   Multiple  0   Live Births              Family History  Problem Relation Age of Onset   Diabetes Brother    Heart disease Brother    Heart disease Mother    Heart disease Father    Asthma Other    Colon cancer Neg Hx    Rectal cancer Neg Hx    Stomach cancer Neg Hx    Esophageal cancer Neg Hx     Social History   Tobacco Use   Smoking status: Former    Types: Cigarettes    Quit date: 08/21/1980    Years since quitting: 40.6   Smokeless tobacco: Former  Scientific laboratory technician Use: Never used  Substance Use Topics   Alcohol use: No    Alcohol/week: 0.0 standard drinks   Drug use: No    Home Medications Prior to Admission medications   Medication Sig Start Date End Date Taking? Authorizing Provider  allopurinol (ZYLOPRIM) 300 MG tablet Take 1 tablet (300 mg total) by mouth 2 (two) times daily. 02/04/21   Biagio Borg, MD  diazepam (VALIUM) 5 MG tablet Take 1 tablet (5 mg total) by mouth every 6 (six) hours as  needed for anxiety. 03/15/21   Biagio Borg, MD  glucose blood [facility] VERIO) test strip 1 each by Other route 2 (two) times daily. And lancets 2/day 08/03/20   Renato Shin, MD  hydrALAZINE (APRESOLINE) 25 MG tablet Take 1 tablet (25 mg total) by mouth every 8 (eight) hours. 02/04/21   Biagio Borg, MD  hydrochlorothiazide (MICROZIDE)  12.5 MG capsule Take 1 capsule (12.5 mg total) by mouth daily. 02/04/21 02/04/22  Biagio Borg, MD  Insulin NPH, Human,, Isophane, (HUMULIN N KWIKPEN) 100 UNIT/ML Kiwkpen Inject 50 Units into the skin every morning. 08/03/20   Renato Shin, MD  Lancets [facility] DELICA PLUS [id number]) [place] Test bid 12/28/20   [provider]  rosuvastatin (CRESTOR) 40 MG tablet Take 1 tablet (40 mg total) by mouth daily. 02/04/21   Biagio Borg, MD    Allergies    Aspirin and Penicillins  Review of Systems   Review of Systems  Constitutional:  Negative for chills and fever.  HENT:  Negative for ear pain and sore throat.   Eyes:  Negative for pain and visual disturbance.  Respiratory:  Negative for cough and shortness of breath.   Cardiovascular:  Negative for chest pain and palpitations.  Gastrointestinal:  Negative for abdominal pain and vomiting.  Genitourinary:  Negative for dysuria and hematuria.  Musculoskeletal:  Negative for arthralgias and back pain.  Skin:  Negative for color change and rash.  Neurological:  Negative for syncope, light-headedness and headaches.  All other systems reviewed and are negative.  Physical Exam Updated Vital Signs BP (!) 158/73   Pulse 63   Temp 97.7 F (36.5 C)   Resp 16   Ht '5\' 3"'$  (1.6 m)   Wt 80.1 kg   SpO2 100%   BMI 31.28 kg/m   Physical Exam Constitutional:      General: She is not in acute distress. HENT:     Head: Normocephalic and atraumatic.  Eyes:     Conjunctiva/sclera: Conjunctivae normal.     Pupils: Pupils are equal, round, and reactive to light.  Cardiovascular:     Rate and Rhythm: Normal  rate and regular rhythm.     Pulses: Normal pulses.  Pulmonary:     Effort: Pulmonary effort is normal. No respiratory distress.  Abdominal:     General: There is no distension.     Tenderness: There is no abdominal tenderness.  Skin:    General: Skin is warm and dry.  Neurological:     General: No focal deficit present.     Mental Status: She is alert and oriented to person, place, and time. Mental status is at baseline.    ED Results / Procedures / Treatments   Labs (all labs ordered are listed, but only abnormal results are displayed) Labs Reviewed  BASIC METABOLIC PANEL - Abnormal; Notable for the following components:      Result Value   Glucose, Bld 271 (*)    BUN 24 (*)    Creatinine, Ser 1.46 (*)    GFR, Estimated 36 (*)    All other components within normal limits  CBC WITH DIFFERENTIAL/PLATELET - Abnormal; Notable for the following components:   Hemoglobin 11.4 (*)    All other components within normal limits  CBG MONITORING, ED - Abnormal; Notable for the following components:   Glucose-Capillary 273 (*)    All other components within normal limits  AMMONIA  QUANTIFERON-TB GOLD PLUS  TROPONIN I (HIGH SENSITIVITY)    EKG EKG Interpretation  Date/Time:  Friday April 22 2021 10:03:08 EDT Ventricular Rate:  71 PR Interval:  194 QRS Duration: 166 QT Interval:  452 QTC Calculation: 492 R Axis:   -53 Text Interpretation: Sinus rhythm Left bundle branch block Confirmed by Octaviano Glow [phone]) on 04/22/2021 10:12:35 AM  Radiology No results found.  Procedures Procedures   Medications Ordered in  ED Medications - No data to display  ED Course  I have reviewed the triage vital signs and the nursing notes.  Pertinent labs & imaging results that were available during my care of the patient were reviewed by me and considered in my medical decision making (see chart for details).  This is an 80 year old female presenting from home with family concerns for  her possible worsening dementia, difficulty taking care of herself.  Supplemental history was provided by family members.  On exam the patient has a pleasant demeanor, is able to answer focused questions, and is fully oriented.  She does not demonstrate active hallucinations on my exam.  However family does report concerns for sundowning episodes.  Her medical work-up is largely unremarkable, has mild hyperglycemia, no evidence of DKA.  No signs or symptoms of sepsis.  Her EKG shows a baseline left bundle branch block with pacemaker, no other acute ischemic findings  Prior medical records reviewed including psychiatric evaluation in June of this year in the ED.  Clinical Course as of 04/22/21 1735  Fri Apr 22, 2021  1015 No answer from daughter Lavella Lemons who is PoA, but her other daughter reported Lavella Lemons is en route to the hospital [MT]  1152 I spoke to [place] on the phone with Larene Beach our [place] present as well.  We explained that we cannot place her mother in a facility from the emergency department.  From medical standpoint her labs appear to be at baseline, she does not have any acute indication for hospitalization.  Her blood sugars are mildly elevated, however she is not in diabetic crisis.  Her CT scan of the brain 3 months ago did not show any acute changes to explain her mental status change. [MT]  40 Her daughter is requested a TB test to help with placement, and we can send the QuantiFERON gold test.  Otherwise advised that she bring her mother to her PCP for an evaluation and for COVID vaccination, which she says is another requirement from the memory facilities.  I have also spoken about a case management consult to see if we may be able to assist with a health aide or someone to check in on her mother at home.  The patient herself said that she be willing to have someone come check in on her.  Otherwise she is cleared for discharge at this time and her daughter will pick her up. [MT]     Clinical Course User Index [MT] Donley Harland, Carola Rhine, MD    Final Clinical Impression(s) / ED Diagnoses Final diagnoses:  Dementia without behavioral disturbance, unspecified dementia type [facility])    Rx / DC Orders ED Discharge Orders     None        Arvell Pulsifer, Carola Rhine, MD 04/22/21 1735

## 2021-04-22 NOTE — ED Notes (Addendum)
Contacted daughter Lavella Lemons to verify that someone is going to pick her up. And was advised that the brother would be picking her up after he got off work. Myrlene Broker [phone]. Per daughter he should be here between 2230 and 2300. Contacted Vanae Zittle and left a generic message about picking up his mother per daughter and mothers request

## 2021-04-23 ENCOUNTER — Telehealth: Payer: Self-pay

## 2021-04-23 NOTE — Telephone Encounter (Signed)
Lavella Lemons called on behalf of [name] stating she is worried about her living alone and explained she is working with their PCP on obtaining another Fl2. Home health was ordered, face to face in on orderset.Amy from [place] accepted patient for PT/RN for med management. Messaged PCP Dr Cathlean Cower with information called tanya, daughter back [phone] , she states she is legal guardian,and has paperwork, but we do not have it. Told her to please bring it in so it can be scanned. Will call the patient, today as well.

## 2021-04-26 NOTE — Telephone Encounter (Signed)
FL2 has been done and given to PCP to review and sign.

## 2021-04-27 DIAGNOSIS — Z743 Need for continuous supervision: Secondary | ICD-10-CM | POA: Diagnosis not present

## 2021-04-27 DIAGNOSIS — N183 Chronic kidney disease, stage 3 unspecified: Secondary | ICD-10-CM | POA: Diagnosis not present

## 2021-04-27 DIAGNOSIS — Z794 Long term (current) use of insulin: Secondary | ICD-10-CM | POA: Diagnosis not present

## 2021-04-27 DIAGNOSIS — I10 Essential (primary) hypertension: Secondary | ICD-10-CM | POA: Diagnosis not present

## 2021-04-27 DIAGNOSIS — M6281 Muscle weakness (generalized): Secondary | ICD-10-CM | POA: Diagnosis not present

## 2021-04-27 DIAGNOSIS — E1140 Type 2 diabetes mellitus with diabetic neuropathy, unspecified: Secondary | ICD-10-CM | POA: Diagnosis not present

## 2021-04-27 LAB — QUANTIFERON-TB GOLD PLUS: QuantiFERON-TB Gold Plus: UNDETERMINED — AB

## 2021-04-27 LAB — QUANTIFERON-TB GOLD PLUS (RQFGPL)
QuantiFERON Mitogen Value: 2.66 IU/mL
QuantiFERON Nil Value: >10 IU/mL
QuantiFERON TB1 Ag Value: >10 IU/mL
QuantiFERON TB2 Ag Value: >10 IU/mL

## 2021-04-28 ENCOUNTER — Telehealth: Payer: Self-pay | Admitting: Internal Medicine

## 2021-04-28 DIAGNOSIS — I10 Essential (primary) hypertension: Secondary | ICD-10-CM | POA: Diagnosis not present

## 2021-04-28 DIAGNOSIS — E1140 Type 2 diabetes mellitus with diabetic neuropathy, unspecified: Secondary | ICD-10-CM | POA: Diagnosis not present

## 2021-04-28 DIAGNOSIS — M6281 Muscle weakness (generalized): Secondary | ICD-10-CM | POA: Diagnosis not present

## 2021-04-28 DIAGNOSIS — Z794 Long term (current) use of insulin: Secondary | ICD-10-CM | POA: Diagnosis not present

## 2021-04-28 DIAGNOSIS — N183 Chronic kidney disease, stage 3 unspecified: Secondary | ICD-10-CM | POA: Diagnosis not present

## 2021-04-28 DIAGNOSIS — Z743 Need for continuous supervision: Secondary | ICD-10-CM | POA: Diagnosis not present

## 2021-04-28 NOTE — Telephone Encounter (Signed)
I was able to speak with [facility] and give her the verbal OK for Nursing eval requesting for medication management, dementia per Dr. Jenny Reichmann.

## 2021-04-28 NOTE — Telephone Encounter (Signed)
Ok for verbals 

## 2021-04-28 NOTE — Telephone Encounter (Signed)
[place] Name: [facility] Agency Name: [facility] Phone #: [phone] Service Requested: NURSING  Frequency of Visits: EVAL   Nursing eval requesting for medication management, dementia  Patient is not taking medication, elevated blood sugar, hallucinations

## 2021-05-02 ENCOUNTER — Telehealth: Payer: Self-pay

## 2021-05-02 DIAGNOSIS — N183 Chronic kidney disease, stage 3 unspecified: Secondary | ICD-10-CM | POA: Diagnosis not present

## 2021-05-02 DIAGNOSIS — Z794 Long term (current) use of insulin: Secondary | ICD-10-CM | POA: Diagnosis not present

## 2021-05-02 DIAGNOSIS — M6281 Muscle weakness (generalized): Secondary | ICD-10-CM | POA: Diagnosis not present

## 2021-05-02 DIAGNOSIS — I10 Essential (primary) hypertension: Secondary | ICD-10-CM | POA: Diagnosis not present

## 2021-05-02 DIAGNOSIS — E1140 Type 2 diabetes mellitus with diabetic neuropathy, unspecified: Secondary | ICD-10-CM | POA: Diagnosis not present

## 2021-05-02 DIAGNOSIS — Z743 Need for continuous supervision: Secondary | ICD-10-CM | POA: Diagnosis not present

## 2021-05-02 NOTE — Telephone Encounter (Signed)
Ok for verbals 

## 2021-05-02 NOTE — Telephone Encounter (Signed)
Please advise as Will an OT with [facility] is calling in regards to obtaining verbal orders for OT 1w 4.  Will can be reached at [phone].

## 2021-05-04 NOTE — Telephone Encounter (Signed)
Verbals given  

## 2021-05-05 DIAGNOSIS — Z794 Long term (current) use of insulin: Secondary | ICD-10-CM | POA: Diagnosis not present

## 2021-05-05 DIAGNOSIS — N183 Chronic kidney disease, stage 3 unspecified: Secondary | ICD-10-CM | POA: Diagnosis not present

## 2021-05-05 DIAGNOSIS — I10 Essential (primary) hypertension: Secondary | ICD-10-CM | POA: Diagnosis not present

## 2021-05-05 DIAGNOSIS — M6281 Muscle weakness (generalized): Secondary | ICD-10-CM | POA: Diagnosis not present

## 2021-05-05 DIAGNOSIS — Z743 Need for continuous supervision: Secondary | ICD-10-CM | POA: Diagnosis not present

## 2021-05-05 DIAGNOSIS — E1140 Type 2 diabetes mellitus with diabetic neuropathy, unspecified: Secondary | ICD-10-CM | POA: Diagnosis not present

## 2021-05-06 DIAGNOSIS — Z794 Long term (current) use of insulin: Secondary | ICD-10-CM | POA: Diagnosis not present

## 2021-05-06 DIAGNOSIS — I10 Essential (primary) hypertension: Secondary | ICD-10-CM | POA: Diagnosis not present

## 2021-05-06 DIAGNOSIS — Z743 Need for continuous supervision: Secondary | ICD-10-CM | POA: Diagnosis not present

## 2021-05-06 DIAGNOSIS — E1140 Type 2 diabetes mellitus with diabetic neuropathy, unspecified: Secondary | ICD-10-CM | POA: Diagnosis not present

## 2021-05-06 DIAGNOSIS — N183 Chronic kidney disease, stage 3 unspecified: Secondary | ICD-10-CM | POA: Diagnosis not present

## 2021-05-06 DIAGNOSIS — M6281 Muscle weakness (generalized): Secondary | ICD-10-CM | POA: Diagnosis not present

## 2021-05-09 ENCOUNTER — Telehealth: Payer: Self-pay

## 2021-05-09 NOTE — Chronic Care Management (AMB) (Signed)
    Chronic Care Management Pharmacy Assistant   Name: Gail Lynch  MRN: [id number] DOB: [birth year]  [name] is an 80 y.o. year old female who presents for his initial CCM visit with the clinical pharmacist.   Recent office visits:  02/04/21 Video Cathlean Cower MD PCP- pt was was seen for Psychosis in elderly. No meds or lab changes. Follow up PRN.  Recent consult visits:  01/19/21 Gardiner Barefoot DPM Podiatry- pt was due to onychmycosis pain.  Pt had debridement and advised to follow up in 3 months.  Hospital visits:  Medication Reconciliation was completed by comparing discharge summary, [name] EMR and Pharmacy list. Admitted to the hospital on 04/22/21 due to Dementia. Discharge date was 04/22/21. Discharged from [place]?Medications Started at [facility] Discharge:?? -started none  Medication Changes at Hospital Discharge: -Changed none  Medications Discontinued at Hospital Discharge: -Stopped none  Medications that remain the same after Hospital Discharge:??  -All other medications will remain the same.     2. Medication Reconciliation was completed by comparing discharge summary, [name] EMR and Pharmacy list.  Admitted to the hospital on 01/21/21 due to Psychosis. Discharge date was 01/24/21. Discharged from [place]?Medications Started at [facility] Discharge:?? -started none  Medication Changes at Hospital Discharge: -Changed none  Medications Discontinued at Hospital Discharge: -Stopped none  Medications that remain the same after Hospital Discharge:??  -All other medications will remain the same.     Medications: Outpatient Encounter Medications as of 05/09/2021  Medication Sig Note   allopurinol (ZYLOPRIM) 300 MG tablet Take 1 tablet (300 mg total) by mouth 2 (two) times daily.    diazepam (VALIUM) 5 MG tablet Take 1 tablet (5 mg total) by mouth every 6 (six) hours as needed for anxiety.    glucose blood (ONETOUCH VERIO)  test strip 1 each by Other route 2 (two) times daily. And lancets 2/day    hydrALAZINE (APRESOLINE) 25 MG tablet Take 1 tablet (25 mg total) by mouth every 8 (eight) hours.    hydrochlorothiazide (MICROZIDE) 12.5 MG capsule Take 1 capsule (12.5 mg total) by mouth daily.    Insulin NPH, Human,, Isophane, (HUMULIN N KWIKPEN) 100 UNIT/ML Kiwkpen Inject 50 Units into the skin every morning. 01/22/2021: LF 01/20/21   Lancets (ONETOUCH DELICA PLUS [id number]) MISC Test bid    rosuvastatin (CRESTOR) 40 MG tablet Take 1 tablet (40 mg total) by mouth daily.    No facility-administered encounter medications on file as of 05/09/2021.    Current Medication List  allopurinol (ZYLOPRIM) 300 MG tablet last filled 02/04/21 45 DS diazepam (VALIUM) 5 MG tablet last filled 03/15/21 1 DS hydrALAZINE (APRESOLINE) 25 MG tablet last filled 02/04/21 30 DS hydrochlorothiazide (MICROZIDE) 12.5 MG capsule last filled 02/04/21 90 DS Insulin NPH, Human,, Isophane, (HUMULIN N KWIKPEN) 100 UNIT/ML last filled 03/10/21 60 rosuvastatin (CRESTOR) 40 MG tablet last filled 02/04/21 90 DS    [place] Pharmacist Assistant [phone]

## 2021-05-11 DIAGNOSIS — Z743 Need for continuous supervision: Secondary | ICD-10-CM | POA: Diagnosis not present

## 2021-05-11 DIAGNOSIS — E1140 Type 2 diabetes mellitus with diabetic neuropathy, unspecified: Secondary | ICD-10-CM | POA: Diagnosis not present

## 2021-05-11 DIAGNOSIS — M6281 Muscle weakness (generalized): Secondary | ICD-10-CM | POA: Diagnosis not present

## 2021-05-11 DIAGNOSIS — N183 Chronic kidney disease, stage 3 unspecified: Secondary | ICD-10-CM | POA: Diagnosis not present

## 2021-05-11 DIAGNOSIS — Z794 Long term (current) use of insulin: Secondary | ICD-10-CM | POA: Diagnosis not present

## 2021-05-11 DIAGNOSIS — I10 Essential (primary) hypertension: Secondary | ICD-10-CM | POA: Diagnosis not present

## 2021-05-18 ENCOUNTER — Telehealth: Payer: Medicare Other

## 2021-05-18 NOTE — Progress Notes (Unsigned)
Chronic Care Management Pharmacy Note  05/18/2021 Name:  Gail Lynch MRN:  [id number] DOB:  May 15, 1941  Summary: ***  Recommendations/Changes made from today's visit: ***  Plan: ***  Subjective: Gail Lynch is an 80 y.o. year old female who is a primary patient of Jenny Reichmann, Hunt Oris, MD.  The CCM team was consulted for assistance with disease management and care coordination needs.    Engaged with patient by telephone for initial visit in response to provider referral for pharmacy case management and/or care coordination services.   Consent to Services:  The patient was given the following information about Chronic Care Management services today, agreed to services, and gave verbal consent: 1. CCM service includes personalized support from designated clinical staff supervised by the primary care provider, including individualized plan of care and coordination with other care providers 2. 24/7 contact phone numbers for assistance for urgent and routine care needs. 3. Service will only be billed when office clinical staff spend 20 minutes or more in a month to coordinate care. 4. Only one practitioner may furnish and bill the service in a calendar month. 5.The patient may stop CCM services at any time (effective at the end of the month) by phone call to the office staff. 6. The patient will be responsible for cost sharing (co-pay) of up to 20% of the service fee (after annual deductible is met). Patient agreed to services and consent obtained.  Patient Care Team: Biagio Borg, MD as PCP - General (Internal Medicine) Constance Haw, MD as PCP - Electrophysiology (Cardiology) Delice Bison Darnelle Maffucci, [facility] as Pharmacist (Pharmacist)  Recent office visits:  02/04/21 Video Cathlean Cower MD PCP- pt was was seen for Psychosis in elderly. No meds or lab changes. Follow up PRN.   Recent consult visits:  01/19/21 Gardiner Barefoot DPM Podiatry- pt was due to onychmycosis pain.  Pt had debridement and  advised to follow up in 3 months.   Hospital visits:  Medication Reconciliation was completed by comparing discharge summary, patient's EMR and Pharmacy list. Admitted to the hospital on 04/22/21 due to Dementia. Discharge date was 04/22/21. Discharged from [place]?Medications Started at [facility] Discharge:?? -started none   Medication Changes at Hospital Discharge: -Changed none   Medications Discontinued at Hospital Discharge: -Stopped none   Medications that remain the same after Hospital Discharge:??  -All other medications will remain the same.       2. Medication Reconciliation was completed by comparing discharge summary, patient's EMR and Pharmacy list.   Admitted to the hospital on 01/21/21 due to Psychosis. Discharge date was 01/24/21. Discharged from [place]?Medications Started at [facility] Discharge:?? -started none   Medication Changes at Hospital Discharge: -Changed none   Medications Discontinued at Hospital Discharge: -Stopped none   Medications that remain the same after Hospital Discharge:??  -All other medications will remain the same.    Objective:  Lab Results  Component Value Date   CREATININE 1.46 (H) 04/22/2021   BUN 24 (H) 04/22/2021   GFR 30.98 (L) 07/29/2020   GFRNONAA 36 (L) 04/22/2021   GFRAA 37 (L) 05/06/2020   NA 138 04/22/2021   K 4.1 04/22/2021   CALCIUM 9.1 04/22/2021   CO2 24 04/22/2021   GLUCOSE 271 (H) 04/22/2021    Lab Results  Component Value Date/Time   HGBA1C 9.2 (H) 01/21/2021 11:41 PM   HGBA1C 10.4 (A) 08/03/2020 08:02 AM   HGBA1C 9.2 (A) 06/21/2020  10:38 AM   HGBA1C 8.1 (H) 03/10/2020 07:40 AM   GFR 30.98 (L) 07/29/2020 10:41 AM   GFR 50.87 (L) 09/17/2018 10:54 AM   MICROALBUR 2.6 (H) 11/21/2017 01:09 PM   MICROALBUR 2.7 (H) 10/10/2016 12:40 PM    Last diabetic Eye exam:  Lab Results  Component Value Date/Time   HMDIABEYEEXA Retinopathy (A) 08/28/2018 02:54 PM    Last  diabetic Foot exam:  No results found for: HMDIABFOOTEX   Lab Results  Component Value Date   CHOL 222 (H) 07/29/2020   HDL 48.20 07/29/2020   LDLCALC 145 (H) 07/29/2020   LDLDIRECT 152.9 12/18/2011   TRIG 145.0 07/29/2020   CHOLHDL 5 07/29/2020    Hepatic Function Latest Ref Rng & Units 01/21/2021 07/29/2020 05/03/2020  Total Protein 6.5 - 8.1 g/dL 6.9 7.0 6.4(L)  Albumin 3.5 - 5.0 g/dL 3.7 3.8 2.7(L)  AST 15 - 41 U/L 17 21 16   ALT 0 - 44 U/L 10 11 9   Alk Phosphatase 38 - 126 U/L 77 72 61  Total Bilirubin 0.3 - 1.2 mg/dL 0.6 0.4 0.5  Bilirubin, Direct 0.0 - 0.3 mg/dL - 0.1 -    Lab Results  Component Value Date/Time   TSH 2.08 07/29/2020 10:41 AM   TSH 2.14 09/17/2018 10:54 AM    CBC Latest Ref Rng & Units 04/22/2021 01/21/2021 07/29/2020  WBC 4.0 - 10.5 K/uL 5.2 5.7 7.4  Hemoglobin 12.0 - 15.0 g/dL 11.4(L) 11.6(L) 11.2(L)  Hematocrit 36.0 - 46.0 % 36.5 36.4 35.2(L)  Platelets 150 - 400 K/uL 298 227 234.0    No results found for: VD25OH  Clinical ASCVD: No  The ASCVD Risk score (Arnett DK, et al., 2019) failed to calculate for the following reasons:   The 2019 ASCVD risk score is only valid for ages 69 to 64    Depression screen PHQ 2/9 03/18/2020 03/18/2020 09/17/2018  Decreased Interest 0 0 0  Down, Depressed, Hopeless 0 0 0  PHQ - 2 Score 0 0 0  Some recent data might be hidden     Social History   Tobacco Use  Smoking Status Former   Types: Cigarettes   Quit date: 08/21/1980   Years since quitting: 40.7  Smokeless Tobacco Former   BP Readings from Last 3 Encounters:  04/22/21 133/66  01/24/21 127/73  08/03/20 (!) 178/90   Pulse Readings from Last 3 Encounters:  04/22/21 82  01/24/21 74  08/03/20 92   Wt Readings from Last 3 Encounters:  04/22/21 185 lb (83.9 kg)  08/03/20 176 lb 9.6 oz (80.1 kg)  07/29/20 174 lb (78.9 kg)   BMI Readings from Last 3 Encounters:  04/22/21 32.77 kg/m  08/03/20 31.28 kg/m  07/29/20 30.82 kg/m     Assessment/Interventions: Review of patient past medical history, allergies, medications, health status, including review of consultants reports, laboratory and other test data, was performed as part of comprehensive evaluation and provision of chronic care management services.   SDOH:  (Social Determinants of Health) assessments and interventions performed: No  SDOH Screenings   Alcohol Screen: Not on file  Depression (FBP1-0): Not on file  Financial Resource Strain: Not on file  Food Insecurity: Not on file  Housing: Not on file  Physical Activity: Not on file  Social Connections: Not on file  Stress: Not on file  Tobacco Use: Medium Risk   Smoking Tobacco Use: Former   Smokeless Tobacco Use: Former  Transport planner Needs: Not on file    [place]  Allergies  Allergen Reactions   Aspirin Other (See Comments)    BLEEDING BEHIND EYES   Penicillins Other (See Comments)    Unknown rxn DID THE REACTION INVOLVE: Swelling of the face/tongue/throat, SOB, or low BP? Unknown Sudden or severe rash/hives, skin peeling, or the inside of the mouth or nose? Unknown Did it require medical treatment? Unknown When did it last happen? Unknown If all above answers are "NO", may proceed with cephalosporin use.    Medications Reviewed Today     Reviewed by Biagio Borg, MD (Physician) on 02/07/21 at [place] List Status: <None>   Medication Order Taking? Sig Documenting Provider Last Dose Status Informant  allopurinol (ZYLOPRIM) 300 MG tablet [id number]  Take 1 tablet (300 mg total) by mouth 2 (two) times daily. Biagio Borg, MD  Active   glucose blood [facility] VERIO) test strip [id number]  1 each by Other route 2 (two) times daily. And lancets 2/day Renato Shin, MD  Active Child  hydrALAZINE (APRESOLINE) 25 MG tablet [id number]  Take 1 tablet (25 mg total) by mouth every 8 (eight) hours. Biagio Borg, MD  Active   hydrochlorothiazide (MICROZIDE) 12.5 MG capsule [id number]  Take 1  capsule (12.5 mg total) by mouth daily. Biagio Borg, MD  Active   Insulin NPH, Human,, Marolyn Hammock, Camille Bal [facility]) 100 UNIT/ML Mayer Masker [id number] No Inject 50 Units into the skin every morning. Renato Shin, MD unk Active Child           Med Note Cristino Martes D   Sat Jan 22, 2021  7:32 AM) LF 01/20/21  Lancets (ONETOUCH DELICA PLUS LNLGXQ11H) [place] [id number]  Test bid [provider]  Active Child  rosuvastatin (CRESTOR) 40 MG tablet [id number]  Take 1 tablet (40 mg total) by mouth daily. Biagio Borg, MD  Active             Patient Active Problem List   Diagnosis Date Noted   Non compliance with medical treatment 07/31/2020   Psychosis in elderly [facility]) 07/29/2020   Diabetic leg ulcer ([place]) 07/29/2020   Benign neoplasm of ascending colon    Benign neoplasm of transverse colon    Benign neoplasm of descending colon    Benign neoplasm of sigmoid colon    History of colon polyps 05/04/2020   Complete AV block ([place]) 03/09/2020   Mild aortic stenosis 03/14/2017   Peripheral edema 03/14/2017   Arm heaviness 03/14/2017   CKD (chronic kidney disease), stage III ([place]) 05/20/2016   Diabetes ([place]) 01/03/2016   Dysuria 12/09/2014   Peripheral neuropathy 12/05/2013   Irregular heart beat 12/05/2013   PVC (premature ventricular contraction) 12/05/2013   Sciatica 06/17/2013   Pain due to onychomycosis of toenail 05/21/2013   Mycotic toenails    Vagina bleeding 08/06/2012   Carotid stenosis 06/18/2012   Constipation 04/30/2012   Childhood asthma 12/18/2011   Glaucoma 12/18/2011   Hyperlipidemia 12/18/2011   Colon polyps 12/18/2011   Preventative health care 12/17/2011   Hypertension 12/17/2011   Hard of hearing    Dysplasia of cervix, low grade (CIN 1)    HPV (human papilloma virus) infection    Brain mass 09/18/2011    Immunization History  Administered Date(s) Administered   Pneumococcal Conjugate-13 06/17/2013   Pneumococcal Polysaccharide-23 12/18/2011   Tdap  12/18/2011    Conditions to be addressed/monitored:  {USCCMDZASSESSMENTOPTIONS:23563}  There are no care plans that you recently modified to display for this patient.     Medication Assistance: {  MEDASSISTANCEINFO:25044}  Compliance/Adherence/Medication fill history: Care Gaps: ***  Patient's preferred pharmacy is:  [facility] ([place] [place] - [address] [place]) [place] [address] Phone: [phone] Fax: [phone]   Uses pill box? {Yes or If no, why not?:20788} Pt endorses ***% compliance  Care Plan and Follow Up Patient Decision:  {FOLLOWUP:24991}  Plan: {CM FOLLOW UP ESPQ:33007}  ***  Current Barriers:  {pharmacybarriers:24917}  Pharmacist Clinical Goal(s):  Patient will {PHARMACYGOALCHOICES:24921} through collaboration with PharmD and provider.   Interventions: 1:1 collaboration with Biagio Borg, MD regarding development and update of comprehensive plan of care as evidenced by provider attestation and co-signature Inter-disciplinary care team collaboration (see longitudinal plan of care) Comprehensive medication review performed; medication list updated in electronic medical record  Hypertension (BP goal <140/90) -{US controlled/uncontrolled:25276} -Current treatment: Hydrochlorothiazide 12.78m - 1 capsule daily Hydralazine 236m- 1 tablet every 8 hours  -Medications previously tried: valsasrtan  -Current home readings: *** BP Readings from Last 3 Encounters:  04/22/21 133/66  01/24/21 127/73  08/03/20 (!) 178/90  -Current dietary habits: *** -Current exercise habits: *** -{ACTIONS;DENIES/REPORTS:21021675::"Denies"} hypotensive/hypertensive symptoms -Educated on {CCM BP Counseling:25124} -Counseled to monitor BP at home ***, document, and provide log at future appointments -{CCMPHARMDINTERVENTION:25122}  Hyperlipidemia: (LDL goal < 100) -{US controlled/uncontrolled:25276} Lab Results   Component Value Date   LDLCALC 145 (H) 07/29/2020  -Current treatment: Rosuvastatin 4058m 1 tablet daily  -Medications previously tried: atorvastatin,   -Current dietary patterns: *** -Current exercise habits: *** -Educated on {CCM HLD Counseling:25126} -{CCMPHARMDINTERVENTION:25122}  Diabetes (A1c goal <8%) -{US controlled/uncontrolled:25276} Lab Results  Component Value Date   HGBA1C 9.2 (H) 01/21/2021  -Current medications: Insulin NPH - 50 units in the morning  -Medications previously tried: novolong, humunlin n, levemir, lantus, novolin 70/30  -Current home glucose readings fasting glucose: *** post prandial glucose: *** -{ACTIONS;DENIES/REPORTS:21021675::"Denies"} hypoglycemic/hyperglycemic symptoms -Current meal patterns:  breakfast: ***  lunch: ***  dinner: *** snacks: *** drinks: *** -Current exercise: *** -Educated on {CCM DM COUNSELING:25123} -Counseled to check feet daily and get yearly eye exams -{CCMPHARMDINTERVENTION:25122}  Gout (Goal: Gout prevention) -{US controlled/uncontrolled:25276} -Current treatment  Allopurinol 300m69m1 tablet twice daily  -Medications previously tried: ***  -{CCMPHARMDINTERVENTION:25122}  Chronic Kidney Disease (Goal: Prevention of disease progression) -{US controlled/uncontrolled:25276} -Last eGFR - 36mL82m  -Last Scr - 1.46mg/80mCurrent treatment  *** -Medications previously tried: ***  -{CCMPHARMDINTERVENTION:25122}   Patient Goals/Self-Care Activities Patient will:  - {pharmacypatientgoals:24919}  Follow Up Plan: {CM FOLLOW UP PLAN:2MAUQ:33354}ent Chart Prep = 16 minutes

## 2021-05-19 DIAGNOSIS — I10 Essential (primary) hypertension: Secondary | ICD-10-CM | POA: Diagnosis not present

## 2021-05-19 DIAGNOSIS — Z794 Long term (current) use of insulin: Secondary | ICD-10-CM | POA: Diagnosis not present

## 2021-05-19 DIAGNOSIS — N183 Chronic kidney disease, stage 3 unspecified: Secondary | ICD-10-CM | POA: Diagnosis not present

## 2021-05-19 DIAGNOSIS — Z743 Need for continuous supervision: Secondary | ICD-10-CM | POA: Diagnosis not present

## 2021-05-19 DIAGNOSIS — E1140 Type 2 diabetes mellitus with diabetic neuropathy, unspecified: Secondary | ICD-10-CM | POA: Diagnosis not present

## 2021-05-19 DIAGNOSIS — M6281 Muscle weakness (generalized): Secondary | ICD-10-CM | POA: Diagnosis not present

## 2021-05-20 DIAGNOSIS — M6281 Muscle weakness (generalized): Secondary | ICD-10-CM | POA: Diagnosis not present

## 2021-05-20 DIAGNOSIS — Z743 Need for continuous supervision: Secondary | ICD-10-CM | POA: Diagnosis not present

## 2021-05-20 DIAGNOSIS — I10 Essential (primary) hypertension: Secondary | ICD-10-CM | POA: Diagnosis not present

## 2021-05-20 DIAGNOSIS — N183 Chronic kidney disease, stage 3 unspecified: Secondary | ICD-10-CM | POA: Diagnosis not present

## 2021-05-20 DIAGNOSIS — E1140 Type 2 diabetes mellitus with diabetic neuropathy, unspecified: Secondary | ICD-10-CM | POA: Diagnosis not present

## 2021-05-20 DIAGNOSIS — Z794 Long term (current) use of insulin: Secondary | ICD-10-CM | POA: Diagnosis not present

## 2021-05-23 ENCOUNTER — Telehealth: Payer: Self-pay

## 2021-05-23 DIAGNOSIS — I10 Essential (primary) hypertension: Secondary | ICD-10-CM | POA: Diagnosis not present

## 2021-05-23 DIAGNOSIS — N183 Chronic kidney disease, stage 3 unspecified: Secondary | ICD-10-CM | POA: Diagnosis not present

## 2021-05-23 DIAGNOSIS — Z794 Long term (current) use of insulin: Secondary | ICD-10-CM | POA: Diagnosis not present

## 2021-05-23 DIAGNOSIS — F03918 Unspecified dementia, unspecified severity, with other behavioral disturbance: Secondary | ICD-10-CM | POA: Diagnosis not present

## 2021-05-23 DIAGNOSIS — M6281 Muscle weakness (generalized): Secondary | ICD-10-CM | POA: Diagnosis not present

## 2021-05-23 DIAGNOSIS — Z743 Need for continuous supervision: Secondary | ICD-10-CM | POA: Diagnosis not present

## 2021-05-23 DIAGNOSIS — E1140 Type 2 diabetes mellitus with diabetic neuropathy, unspecified: Secondary | ICD-10-CM | POA: Diagnosis not present

## 2021-05-23 NOTE — Telephone Encounter (Signed)
Legal guardian is requesting a letter giving permission for the facility to administer the COVID vaccine from provider.  Legal guardian contacted via phone Alicia Amel (Daughter)  [phone]

## 2021-05-24 NOTE — Telephone Encounter (Signed)
Gail Lynch (pt's daughter) called again regarding the message below. She provided the office and fax number for the facility as well. Requesting a callback to update her when it is completed.   [place] F: [phone]

## 2021-05-25 DIAGNOSIS — E1140 Type 2 diabetes mellitus with diabetic neuropathy, unspecified: Secondary | ICD-10-CM | POA: Diagnosis not present

## 2021-05-25 DIAGNOSIS — M6281 Muscle weakness (generalized): Secondary | ICD-10-CM | POA: Diagnosis not present

## 2021-05-25 DIAGNOSIS — Z743 Need for continuous supervision: Secondary | ICD-10-CM | POA: Diagnosis not present

## 2021-05-25 DIAGNOSIS — Z794 Long term (current) use of insulin: Secondary | ICD-10-CM | POA: Diagnosis not present

## 2021-05-25 DIAGNOSIS — I10 Essential (primary) hypertension: Secondary | ICD-10-CM | POA: Diagnosis not present

## 2021-05-25 DIAGNOSIS — N183 Chronic kidney disease, stage 3 unspecified: Secondary | ICD-10-CM | POA: Diagnosis not present

## 2021-05-25 DIAGNOSIS — F03918 Unspecified dementia, unspecified severity, with other behavioral disturbance: Secondary | ICD-10-CM | POA: Diagnosis not present

## 2021-05-25 NOTE — Telephone Encounter (Signed)
Ok this is done Forensic psychologist

## 2021-05-26 ENCOUNTER — Encounter: Payer: Self-pay | Admitting: Internal Medicine

## 2021-06-01 DIAGNOSIS — N183 Chronic kidney disease, stage 3 unspecified: Secondary | ICD-10-CM | POA: Diagnosis not present

## 2021-06-01 DIAGNOSIS — E1140 Type 2 diabetes mellitus with diabetic neuropathy, unspecified: Secondary | ICD-10-CM | POA: Diagnosis not present

## 2021-06-01 DIAGNOSIS — Z794 Long term (current) use of insulin: Secondary | ICD-10-CM | POA: Diagnosis not present

## 2021-06-01 DIAGNOSIS — M6281 Muscle weakness (generalized): Secondary | ICD-10-CM | POA: Diagnosis not present

## 2021-06-01 DIAGNOSIS — F03918 Unspecified dementia, unspecified severity, with other behavioral disturbance: Secondary | ICD-10-CM | POA: Diagnosis not present

## 2021-06-01 DIAGNOSIS — I10 Essential (primary) hypertension: Secondary | ICD-10-CM | POA: Diagnosis not present

## 2021-06-01 DIAGNOSIS — Z743 Need for continuous supervision: Secondary | ICD-10-CM | POA: Diagnosis not present

## 2021-06-09 ENCOUNTER — Ambulatory Visit (INDEPENDENT_AMBULATORY_CARE_PROVIDER_SITE_OTHER): Payer: Medicare Other

## 2021-06-09 DIAGNOSIS — I442 Atrioventricular block, complete: Secondary | ICD-10-CM

## 2021-06-09 LAB — CUP PACEART REMOTE DEVICE CHECK
Battery Remaining Longevity: 111 mo
Battery Voltage: 3.01 V
Brady Statistic AP VP Percent: 9.31 %
Brady Statistic AP VS Percent: 0 %
Brady Statistic AS VP Percent: 90.62 %
Brady Statistic AS VS Percent: 0.07 %
Brady Statistic RA Percent Paced: 9.14 %
Brady Statistic RV Percent Paced: 99.93 %
Date Time Interrogation Session: 20221019194312
Implantable Lead Implant Date: 20210720
Implantable Lead Implant Date: 20210720
Implantable Lead Location: 753859
Implantable Lead Location: 753860
Implantable Lead Model: 5076
Implantable Lead Model: 5076
Implantable Pulse Generator Implant Date: 20210720
Lead Channel Impedance Value: 285 Ohm
Lead Channel Impedance Value: 304 Ohm
Lead Channel Impedance Value: 342 Ohm
Lead Channel Impedance Value: 380 Ohm
Lead Channel Pacing Threshold Amplitude: 0.5 V
Lead Channel Pacing Threshold Amplitude: 0.5 V
Lead Channel Pacing Threshold Pulse Width: 0.4 ms
Lead Channel Pacing Threshold Pulse Width: 0.4 ms
Lead Channel Sensing Intrinsic Amplitude: 1.375 mV
Lead Channel Sensing Intrinsic Amplitude: 1.375 mV
Lead Channel Sensing Intrinsic Amplitude: 2 mV
Lead Channel Sensing Intrinsic Amplitude: 3.625 mV
Lead Channel Setting Pacing Amplitude: 1.5 V
Lead Channel Setting Pacing Amplitude: 2.5 V
Lead Channel Setting Pacing Pulse Width: 0.4 ms
Lead Channel Setting Sensing Sensitivity: 1.2 mV

## 2021-06-16 NOTE — Progress Notes (Signed)
Remote pacemaker transmission.   

## 2021-06-29 ENCOUNTER — Emergency Department (HOSPITAL_COMMUNITY): Payer: Medicare Other

## 2021-06-29 ENCOUNTER — Emergency Department (HOSPITAL_COMMUNITY)
Admission: EM | Admit: 2021-06-29 | Discharge: 2021-06-29 | Disposition: A | Discharge status: Home / Self Care | Payer: Medicare Other | Attending: Emergency Medicine | Admitting: Emergency Medicine

## 2021-06-29 DIAGNOSIS — N183 Chronic kidney disease, stage 3 unspecified: Secondary | ICD-10-CM | POA: Diagnosis not present

## 2021-06-29 DIAGNOSIS — Z794 Long term (current) use of insulin: Secondary | ICD-10-CM | POA: Insufficient documentation

## 2021-06-29 DIAGNOSIS — R079 Chest pain, unspecified: Secondary | ICD-10-CM

## 2021-06-29 DIAGNOSIS — I129 Hypertensive chronic kidney disease with stage 1 through stage 4 chronic kidney disease, or unspecified chronic kidney disease: Secondary | ICD-10-CM | POA: Diagnosis not present

## 2021-06-29 DIAGNOSIS — Y9301 Activity, walking, marching and hiking: Secondary | ICD-10-CM | POA: Diagnosis not present

## 2021-06-29 DIAGNOSIS — Z86018 Personal history of other benign neoplasm: Secondary | ICD-10-CM | POA: Diagnosis not present

## 2021-06-29 DIAGNOSIS — Y92009 Unspecified place in unspecified non-institutional (private) residence as the place of occurrence of the external cause: Secondary | ICD-10-CM | POA: Insufficient documentation

## 2021-06-29 DIAGNOSIS — Z950 Presence of cardiac pacemaker: Secondary | ICD-10-CM | POA: Insufficient documentation

## 2021-06-29 DIAGNOSIS — M19012 Primary osteoarthritis, left shoulder: Secondary | ICD-10-CM | POA: Diagnosis not present

## 2021-06-29 DIAGNOSIS — S4992XA Unspecified injury of left shoulder and upper arm, initial encounter: Secondary | ICD-10-CM | POA: Insufficient documentation

## 2021-06-29 DIAGNOSIS — Z743 Need for continuous supervision: Secondary | ICD-10-CM | POA: Diagnosis not present

## 2021-06-29 DIAGNOSIS — E1122 Type 2 diabetes mellitus with diabetic chronic kidney disease: Secondary | ICD-10-CM | POA: Diagnosis not present

## 2021-06-29 DIAGNOSIS — S299XXA Unspecified injury of thorax, initial encounter: Secondary | ICD-10-CM | POA: Diagnosis not present

## 2021-06-29 DIAGNOSIS — W010XXA Fall on same level from slipping, tripping and stumbling without subsequent striking against object, initial encounter: Secondary | ICD-10-CM | POA: Insufficient documentation

## 2021-06-29 DIAGNOSIS — F0390 Unspecified dementia without behavioral disturbance: Secondary | ICD-10-CM | POA: Insufficient documentation

## 2021-06-29 DIAGNOSIS — M25512 Pain in left shoulder: Secondary | ICD-10-CM | POA: Diagnosis not present

## 2021-06-29 DIAGNOSIS — W19XXXA Unspecified fall, initial encounter: Secondary | ICD-10-CM

## 2021-06-29 DIAGNOSIS — R0789 Other chest pain: Secondary | ICD-10-CM | POA: Diagnosis not present

## 2021-06-29 DIAGNOSIS — R739 Hyperglycemia, unspecified: Secondary | ICD-10-CM | POA: Diagnosis not present

## 2021-06-29 DIAGNOSIS — J45909 Unspecified asthma, uncomplicated: Secondary | ICD-10-CM | POA: Insufficient documentation

## 2021-06-29 DIAGNOSIS — I10 Essential (primary) hypertension: Secondary | ICD-10-CM | POA: Diagnosis not present

## 2021-06-29 DIAGNOSIS — Z87891 Personal history of nicotine dependence: Secondary | ICD-10-CM | POA: Insufficient documentation

## 2021-06-29 DIAGNOSIS — M25519 Pain in unspecified shoulder: Secondary | ICD-10-CM | POA: Diagnosis not present

## 2021-06-29 LAB — CBC
HCT: 33.5 % — ABNORMAL LOW (ref 36.0–46.0)
Hemoglobin: 10.7 g/dL — ABNORMAL LOW (ref 12.0–15.0)
MCH: 28 pg (ref 26.0–34.0)
MCHC: 31.9 g/dL (ref 30.0–36.0)
MCV: 87.7 fL (ref 80.0–100.0)
Platelets: 199 10*3/uL (ref 150–400)
RBC: 3.82 MIL/uL — ABNORMAL LOW (ref 3.87–5.11)
RDW: 13.3 % (ref 11.5–15.5)
WBC: 5.1 10*3/uL (ref 4.0–10.5)
nRBC: 0 % (ref 0.0–0.2)

## 2021-06-29 LAB — COMPREHENSIVE METABOLIC PANEL
ALT: 11 U/L (ref 0–44)
AST: 16 U/L (ref 15–41)
Albumin: 3.2 g/dL — ABNORMAL LOW (ref 3.5–5.0)
Alkaline Phosphatase: 79 U/L (ref 38–126)
Anion gap: 8 (ref 5–15)
BUN: 19 mg/dL (ref 8–23)
CO2: 24 mmol/L (ref 22–32)
Calcium: 9.2 mg/dL (ref 8.9–10.3)
Chloride: 105 mmol/L (ref 98–111)
Creatinine, Ser: 1.54 mg/dL — ABNORMAL HIGH (ref 0.44–1.00)
GFR, Estimated: 34 mL/min — ABNORMAL LOW (ref >60)
Glucose, Bld: 322 mg/dL — ABNORMAL HIGH (ref 70–99)
Potassium: 4.6 mmol/L (ref 3.5–5.1)
Sodium: 137 mmol/L (ref 135–145)
Total Bilirubin: 0.4 mg/dL (ref 0.3–1.2)
Total Protein: 6.1 g/dL — ABNORMAL LOW (ref 6.5–8.1)

## 2021-06-29 LAB — PROTIME-INR
INR: 1 (ref 0.8–1.2)
Prothrombin Time: 13.4 seconds (ref 11.4–15.2)

## 2021-06-29 LAB — URINALYSIS, ROUTINE W REFLEX MICROSCOPIC
Bilirubin Urine: NEGATIVE
Glucose, UA: 150 mg/dL — AB
Hgb urine dipstick: NEGATIVE
Ketones, ur: NEGATIVE mg/dL
Leukocytes,Ua: NEGATIVE
Nitrite: NEGATIVE
Protein, ur: NEGATIVE mg/dL
Specific Gravity, Urine: 1.012 (ref 1.005–1.030)
pH: 6 (ref 5.0–8.0)

## 2021-06-29 LAB — MAGNESIUM: Magnesium: 1.6 mg/dL — ABNORMAL LOW (ref 1.7–2.4)

## 2021-06-29 LAB — TROPONIN I (HIGH SENSITIVITY): Troponin I (High Sensitivity): 17 ng/L (ref <18)

## 2021-06-29 LAB — CBG MONITORING, ED: Glucose-Capillary: 308 mg/dL — ABNORMAL HIGH (ref 70–99)

## 2021-06-29 LAB — LIPASE, BLOOD: Lipase: 32 U/L (ref 11–51)

## 2021-06-29 MED ORDER — INSULIN ASPART 100 UNIT/ML IJ SOLN
5.0000 [IU] | Freq: Once | INTRAMUSCULAR | Status: AC
  Administered 2021-06-29: 5 [IU] via SUBCUTANEOUS

## 2021-06-29 MED ORDER — ACETAMINOPHEN 325 MG PO TABS
650.0000 mg | ORAL_TABLET | Freq: Once | ORAL | Status: AC
  Administered 2021-06-29: 650 mg via ORAL
  Filled 2021-06-29: qty 2

## 2021-06-29 MED ORDER — MAGNESIUM OXIDE -MG SUPPLEMENT 400 (240 MG) MG PO TABS
800.0000 mg | ORAL_TABLET | Freq: Once | ORAL | Status: AC
  Administered 2021-06-29: 800 mg via ORAL
  Filled 2021-06-29: qty 2

## 2021-06-29 NOTE — ED Triage Notes (Signed)
.   Pt BIB [facility] from home d/t a fall. Hx dementia. A&O 4 . Per EMS, pt fell on L side. C/O L shoulder pain. No deformity noted. Denied hitting head. NO LOC. Pt is a diabetic, but did not take her meds bc she thinks its being poisoned.    Bp 148/66 Pulse 78  98% RA  Cbg 384.

## 2021-06-29 NOTE — ED Provider Notes (Signed)
[facility] Provider Note   CSN: [id number] Arrival date & time: 06/29/21  1803     History Chief Complaint  Patient presents with   Gail Lynch    Gail Lynch is a 80 y.o. female.  HPI Patient presents for left shoulder pain following a fall.  On arrival, she endorses left-sided chest and shoulder pain.  She does have a history of dementia.  I spoke with her daughter over the telephone who reports the following: Patient's daughter has been staying with her.  Her dementia does appear to be worsening she has spent yesterday and today wandering in the neighborhood.  Today, her daughter was keeping an eye on her and went to go pick her up from her walk.  When attempting to get her in the car, patient started pulled on her purse.  This caused the patient to lose her balance and she fell onto a grassy ground.  At that point, patient's daughter called EMS.  Patient's daughters have been working on [facility] facility placement.  Other than the fall, patient has not endorsed any recent physical complaints.    Past Medical History:  Diagnosis Date   Acute lower GI bleeding 05/03/2020   Arthritis    gout in feet   Asthma    hx as a child - no inhaler, no problems   Carotid stenosis 06/18/2012   Colon polyps    Diabetes mellitus    Type 2   Dysplasia of cervix, low grade (CIN 1)    s/p cervical conization   Glaucoma    Glaucoma 12/18/2011   Hard of hearing    hear aids - left   HPV (human papilloma virus) infection    Hyperlipidemia    Hypertension    Intrinsic asthma, unspecified 12/18/2011   Mild aortic stenosis 03/14/2017   Mycotic toenails 02/19/2013   PONV (postoperative nausea and vomiting)    SVD (spontaneous vaginal delivery)    x 4    Patient Active Problem List   Diagnosis Date Noted   Non compliance with medical treatment 07/31/2020   Psychosis in elderly ([place]) 07/29/2020   Diabetic leg ulcer ([place]) 07/29/2020   Benign neoplasm of ascending  colon    Benign neoplasm of transverse colon    Benign neoplasm of descending colon    Benign neoplasm of sigmoid colon    History of colon polyps 05/04/2020   Complete AV block ([place]) 03/09/2020   Mild aortic stenosis 03/14/2017   Peripheral edema 03/14/2017   Arm heaviness 03/14/2017   CKD (chronic kidney disease), stage III ([place]) 05/20/2016   Diabetes ([place]) 01/03/2016   Dysuria 12/09/2014   Peripheral neuropathy 12/05/2013   Irregular heart beat 12/05/2013   PVC (premature ventricular contraction) 12/05/2013   Sciatica 06/17/2013   Pain due to onychomycosis of toenail 05/21/2013   Mycotic toenails    Vagina bleeding 08/06/2012   Carotid stenosis 06/18/2012   Constipation 04/30/2012   Childhood asthma 12/18/2011   Glaucoma 12/18/2011   Hyperlipidemia 12/18/2011   Colon polyps 12/18/2011   Preventative health care 12/17/2011   Hypertension 12/17/2011   Hard of hearing    Dysplasia of cervix, low grade (CIN 1)    HPV (human papilloma virus) infection    Brain mass 09/18/2011    Past Surgical History:  Procedure Laterality Date   BREAST BIOPSY  2010   BREAST BIOPSY  1991   COLONOSCOPY     COLONOSCOPY WITH PROPOFOL N/A 05/06/2020   Procedure: COLONOSCOPY WITH  PROPOFOL;  Surgeon: Jerene Bears, MD;  Location: [facility] ENDOSCOPY;  Service: Endoscopy;  Laterality: N/A;   DILATION AND CURETTAGE OF UTERUS  2000   HYSTEROSCOPY WITH D & C N/A 03/04/2014   Procedure: DILATATION AND CURETTAGE /HYSTEROSCOPY;  Surgeon: Lavonia Drafts, MD;  Location: [facility];  Service: Gynecology;  Laterality: N/A;   KNEE SURGERY  2010   patella fracture   MULTIPLE TOOTH EXTRACTIONS     PACEMAKER IMPLANT N/A 03/09/2020   Procedure: PACEMAKER IMPLANT;  Surgeon: Constance Haw, MD;  Location: [facility] CV LAB;  Service: Cardiovascular;  Laterality: N/A;   POLYPECTOMY     POLYPECTOMY  05/06/2020   Procedure: POLYPECTOMY;  Surgeon: Jerene Bears, MD;  Location: [facility] ENDOSCOPY;  Service: Endoscopy;;      OB History     Gravida  3   Para  3   Term  3   Preterm  0   AB  0   Living  3      SAB  0   IAB  0   Ectopic  0   Multiple  0   Live Births              Family History  Problem Relation Age of Onset   Diabetes Brother    Heart disease Brother    Heart disease Mother    Heart disease Father    Asthma Other    Colon cancer Neg Hx    Rectal cancer Neg Hx    Stomach cancer Neg Hx    Esophageal cancer Neg Hx     Social History   Tobacco Use   Smoking status: Former    Types: Cigarettes    Quit date: 08/21/1980    Years since quitting: 40.8   Smokeless tobacco: Former  Scientific laboratory technician Use: Never used  Substance Use Topics   Alcohol use: No    Alcohol/week: 0.0 standard drinks   Drug use: No    Home Medications Prior to Admission medications   Medication Sig Start Date End Date Taking? Authorizing Provider  allopurinol (ZYLOPRIM) 300 MG tablet Take 1 tablet (300 mg total) by mouth 2 (two) times daily. 02/04/21   Biagio Borg, MD  diazepam (VALIUM) 5 MG tablet Take 1 tablet (5 mg total) by mouth every 6 (six) hours as needed for anxiety. 03/15/21   Biagio Borg, MD  glucose blood [facility] VERIO) test strip 1 each by Other route 2 (two) times daily. And lancets 2/day 08/03/20   Renato Shin, MD  hydrALAZINE (APRESOLINE) 25 MG tablet Take 1 tablet (25 mg total) by mouth every 8 (eight) hours. 02/04/21   Biagio Borg, MD  hydrochlorothiazide (MICROZIDE) 12.5 MG capsule Take 1 capsule (12.5 mg total) by mouth daily. 02/04/21 02/04/22  Biagio Borg, MD  Insulin NPH, Human,, Isophane, (HUMULIN N KWIKPEN) 100 UNIT/ML Kiwkpen Inject 50 Units into the skin every morning. 08/03/20   Renato Shin, MD  Lancets [facility] DELICA PLUS [id number]) [place] Test bid 12/28/20   [provider]  rosuvastatin (CRESTOR) 40 MG tablet Take 1 tablet (40 mg total) by mouth daily. 02/04/21   Biagio Borg, MD    Allergies    Aspirin and Penicillins  Review of  Systems   Review of Systems  Constitutional:  Negative for chills and fever.  HENT:  Negative for congestion, ear pain and sore throat.   Eyes:  Negative for pain and visual disturbance.  Respiratory:  Negative for cough, chest tightness and shortness of breath.   Cardiovascular:  Positive for chest pain. Negative for palpitations.  Gastrointestinal:  Negative for abdominal pain, nausea and vomiting.  Genitourinary:  Negative for dysuria, flank pain and hematuria.  Musculoskeletal:  Positive for arthralgias. Negative for back pain, gait problem, joint swelling, myalgias and neck pain.  Skin:  Negative for color change, rash and wound.  Neurological:  Negative for dizziness, seizures, syncope, weakness, light-headedness, numbness and headaches.  Psychiatric/Behavioral:         Baseline dementia  All other systems reviewed and are negative.  Physical Exam Updated Vital Signs BP (!) 118/54   Pulse 71   Temp 98.1 F (36.7 C)   Resp 18   SpO2 99%   Physical Exam Vitals and nursing note reviewed.  Constitutional:      General: She is not in acute distress.    Appearance: Normal appearance. She is well-developed and normal weight. She is not ill-appearing, toxic-appearing or diaphoretic.  HENT:     Head: Normocephalic and atraumatic.     Right Ear: External ear normal.     Left Ear: External ear normal.     Nose: Nose normal.  Eyes:     Conjunctiva/sclera: Conjunctivae normal.  Cardiovascular:     Rate and Rhythm: Normal rate and regular rhythm.     Heart sounds: No murmur heard. Pulmonary:     Effort: Pulmonary effort is normal. No respiratory distress.     Breath sounds: Normal breath sounds. No wheezing or rales.  Chest:     Chest wall: No tenderness.  Abdominal:     Palpations: Abdomen is soft.     Tenderness: There is no abdominal tenderness.  Musculoskeletal:        General: No swelling, tenderness or deformity. Normal range of motion.     Cervical back: Normal range  of motion and neck supple. No rigidity.  Skin:    General: Skin is warm and dry.     Coloration: Skin is not jaundiced or pale.  Neurological:     General: No focal deficit present.     Mental Status: She is alert. Mental status is at baseline.  Psychiatric:        Mood and Affect: Mood normal.        Behavior: Behavior normal.    ED Results / Procedures / Treatments   Labs (all labs ordered are listed, but only abnormal results are displayed) Labs Reviewed  CBC - Abnormal; Notable for the following components:      Result Value   RBC 3.82 (*)    Hemoglobin 10.7 (*)    HCT 33.5 (*)    All other components within normal limits  MAGNESIUM - Abnormal; Notable for the following components:   Magnesium 1.6 (*)    All other components within normal limits  COMPREHENSIVE METABOLIC PANEL - Abnormal; Notable for the following components:   Glucose, Bld 322 (*)    Creatinine, Ser 1.54 (*)    Total Protein 6.1 (*)    Albumin 3.2 (*)    GFR, Estimated 34 (*)    All other components within normal limits  URINALYSIS, ROUTINE W REFLEX MICROSCOPIC - Abnormal; Notable for the following components:   Glucose, UA 150 (*)    All other components within normal limits  CBG MONITORING, ED - Abnormal; Notable for the following components:   Glucose-Capillary 308 (*)    All other components within normal limits  LIPASE, BLOOD  PROTIME-INR  TROPONIN I (HIGH SENSITIVITY)  TROPONIN I (HIGH SENSITIVITY)    EKG EKG Interpretation  Date/Time:  Wednesday June 29 2021 18:28:32 EST Ventricular Rate:  82 PR Interval:    QRS Duration: 152 QT Interval:  416 QTC Calculation: 486 R Axis:   -57 Text Interpretation: Ventricular-paced rhythm Abnormal ECG Confirmed by Godfrey Pick [phone]) on 06/29/2021 6:37:14 PM  Radiology DG Chest 2 View  Result Date: 06/29/2021 CLINICAL DATA:  [place] today.  Left shoulder pain. EXAM: CHEST - 2 VIEW COMPARISON:  03/10/2020 FINDINGS: Heart size is normal. There is  aortic atherosclerosis. Dual lead pacemaker is in place, apparently changed since the prior study. Mild bibasilar scarring as seen previously. No acute bone finding. IMPRESSION: No acute finding. Chronic basilar pulmonary scarring. Pacemaker appears different than on the previous study, presumably changed. If not, ventricular lead may have dislodged. Electronically Signed   By: Nelson Chimes M.D.   On: 06/29/2021 18:55   DG Shoulder Left  Result Date: 06/29/2021 CLINICAL DATA:  [place] today.  Left shoulder pain. EXAM: LEFT SHOULDER - 2+ VIEW COMPARISON:  None. FINDINGS: No evidence of fracture or dislocation. Mild chronic degenerative changes of the glenohumeral joint and AC joint. IMPRESSION: No acute or traumatic finding.  Mild degenerative changes. Electronically Signed   By: Nelson Chimes M.D.   On: 06/29/2021 18:56    Procedures Procedures   Medications Ordered in ED Medications  acetaminophen (TYLENOL) tablet 650 mg (650 mg Oral Given 06/29/21 2115)  magnesium oxide (MAG-OX) tablet 800 mg (800 mg Oral Given 06/29/21 2115)  insulin aspart (novoLOG) injection 5 Units (5 Units Subcutaneous Given 06/29/21 2115)    ED Course  I have reviewed the triage vital signs and the nursing notes.  Pertinent labs & imaging results that were available during my care of the patient were reviewed by me and considered in my medical decision making (see chart for details).    MDM Rules/Calculators/A&P                          Patient presents by EMS following a ground-level fall.  The initial complaint was left shoulder pain.  When speaking with the patient, she does describe a pain that is located in the left portion of her chest that she states is deep inside.  These areas are not tender to palpation.  She does appear to have good range of motion in her left shoulder.  Will obtain x-ray imaging of chest and shoulder in addition to cardiac work-up.  I spoke with her daughter over the phone, who has been staying  with her.  Patient's daughter described a low mechanism ground-level fall onto grass.  Patient's daughter states that she will be able to come get her if she does get discharged from the ED.  X-ray images of chest and shoulder showed no acute injuries.  Radiologist interpretation of chest x-ray stated that pacemaker appears different than previous studies and there was concern of a ventricular lead dislodgment.  I spoke with patient's daughter who stated that patient has not had any pacemaker device replacement.  I spoke with the cardiologist on-call who stated that the leads did appear normal on chest x-ray.  On EKG, patient has 100% capture.  Device was interrogated in the ED and I received a report over the telephone.  Interrogation report stated that device was functioning normally with 100% capture.  Lab work showed hypomagnesemia.  Patient was given oral replacement.  She was also given a dose of insulin for her hyperglycemia.  Remaining lab work was unremarkable.  Patient's hemoglobin is baseline.  Patient's daughter was informed of reassuring work-up results and stated that she would come to the ED to pick up her mother.  Patient was discharged in stable condition. Final Clinical Impression(s) / ED Diagnoses Final diagnoses:  Fall, initial encounter    Rx / DC Orders ED Discharge Orders     None        Godfrey Pick, MD 06/30/21 0031

## 2021-06-30 ENCOUNTER — Emergency Department (HOSPITAL_COMMUNITY)
Admission: EM | Admit: 2021-06-30 | Discharge: 2021-07-07 | Disposition: A | Discharge status: Home / Self Care | Payer: Medicare Other | Attending: Emergency Medicine | Admitting: Emergency Medicine

## 2021-06-30 ENCOUNTER — Other Ambulatory Visit: Payer: Self-pay

## 2021-06-30 ENCOUNTER — Encounter (HOSPITAL_COMMUNITY): Payer: Self-pay | Admitting: Emergency Medicine

## 2021-06-30 ENCOUNTER — Emergency Department (HOSPITAL_COMMUNITY): Payer: Medicare Other

## 2021-06-30 DIAGNOSIS — Z85038 Personal history of other malignant neoplasm of large intestine: Secondary | ICD-10-CM | POA: Insufficient documentation

## 2021-06-30 DIAGNOSIS — Z794 Long term (current) use of insulin: Secondary | ICD-10-CM | POA: Insufficient documentation

## 2021-06-30 DIAGNOSIS — E1140 Type 2 diabetes mellitus with diabetic neuropathy, unspecified: Secondary | ICD-10-CM | POA: Insufficient documentation

## 2021-06-30 DIAGNOSIS — N183 Chronic kidney disease, stage 3 unspecified: Secondary | ICD-10-CM | POA: Diagnosis not present

## 2021-06-30 DIAGNOSIS — J45909 Unspecified asthma, uncomplicated: Secondary | ICD-10-CM | POA: Insufficient documentation

## 2021-06-30 DIAGNOSIS — R441 Visual hallucinations: Secondary | ICD-10-CM | POA: Diagnosis not present

## 2021-06-30 DIAGNOSIS — I129 Hypertensive chronic kidney disease with stage 1 through stage 4 chronic kidney disease, or unspecified chronic kidney disease: Secondary | ICD-10-CM | POA: Diagnosis not present

## 2021-06-30 DIAGNOSIS — F0390 Unspecified dementia without behavioral disturbance: Secondary | ICD-10-CM | POA: Diagnosis not present

## 2021-06-30 DIAGNOSIS — Z87891 Personal history of nicotine dependence: Secondary | ICD-10-CM | POA: Diagnosis not present

## 2021-06-30 DIAGNOSIS — Z20822 Contact with and (suspected) exposure to covid-19: Secondary | ICD-10-CM | POA: Insufficient documentation

## 2021-06-30 DIAGNOSIS — Z79899 Other long term (current) drug therapy: Secondary | ICD-10-CM | POA: Diagnosis not present

## 2021-06-30 DIAGNOSIS — E1122 Type 2 diabetes mellitus with diabetic chronic kidney disease: Secondary | ICD-10-CM | POA: Insufficient documentation

## 2021-06-30 DIAGNOSIS — Z046 Encounter for general psychiatric examination, requested by authority: Secondary | ICD-10-CM | POA: Diagnosis present

## 2021-06-30 DIAGNOSIS — F23 Brief psychotic disorder: Secondary | ICD-10-CM

## 2021-06-30 DIAGNOSIS — R000 Tachycardia, unspecified: Secondary | ICD-10-CM | POA: Diagnosis not present

## 2021-06-30 LAB — COMPREHENSIVE METABOLIC PANEL
ALT: 14 U/L (ref 0–44)
AST: 24 U/L (ref 15–41)
Albumin: 3.4 g/dL — ABNORMAL LOW (ref 3.5–5.0)
Alkaline Phosphatase: 83 U/L (ref 38–126)
Anion gap: 10 (ref 5–15)
BUN: 18 mg/dL (ref 8–23)
CO2: 24 mmol/L (ref 22–32)
Calcium: 9.3 mg/dL (ref 8.9–10.3)
Chloride: 105 mmol/L (ref 98–111)
Creatinine, Ser: 1.5 mg/dL — ABNORMAL HIGH (ref 0.44–1.00)
GFR, Estimated: 35 mL/min — ABNORMAL LOW (ref >60)
Glucose, Bld: 258 mg/dL — ABNORMAL HIGH (ref 70–99)
Potassium: 3.8 mmol/L (ref 3.5–5.1)
Sodium: 139 mmol/L (ref 135–145)
Total Bilirubin: 0.5 mg/dL (ref 0.3–1.2)
Total Protein: 6.6 g/dL (ref 6.5–8.1)

## 2021-06-30 LAB — CBC WITH DIFFERENTIAL/PLATELET
Abs Immature Granulocytes: 0.02 10*3/uL (ref 0.00–0.07)
Basophils Absolute: 0 10*3/uL (ref 0.0–0.1)
Basophils Relative: 1 %
Eosinophils Absolute: 0.1 10*3/uL (ref 0.0–0.5)
Eosinophils Relative: 1 %
HCT: 36.7 % (ref 36.0–46.0)
Hemoglobin: 11.4 g/dL — ABNORMAL LOW (ref 12.0–15.0)
Immature Granulocytes: 0 %
Lymphocytes Relative: 29 %
Lymphs Abs: 1.6 10*3/uL (ref 0.7–4.0)
MCH: 28 pg (ref 26.0–34.0)
MCHC: 31.1 g/dL (ref 30.0–36.0)
MCV: 90.2 fL (ref 80.0–100.0)
Monocytes Absolute: 0.5 10*3/uL (ref 0.1–1.0)
Monocytes Relative: 10 %
Neutro Abs: 3.2 10*3/uL (ref 1.7–7.7)
Neutrophils Relative %: 59 %
Platelets: 237 10*3/uL (ref 150–400)
RBC: 4.07 MIL/uL (ref 3.87–5.11)
RDW: 13.4 % (ref 11.5–15.5)
WBC: 5.4 10*3/uL (ref 4.0–10.5)
nRBC: 0 % (ref 0.0–0.2)

## 2021-06-30 LAB — RESP PANEL BY RT-PCR (FLU A&B, COVID) ARPGX2
Influenza A by PCR: NEGATIVE
Influenza B by PCR: NEGATIVE
SARS Coronavirus 2 by RT PCR: NEGATIVE

## 2021-06-30 LAB — ETHANOL: Alcohol, Ethyl (B): <10 mg/dL (ref <10)

## 2021-06-30 MED ORDER — HYDRALAZINE HCL 25 MG PO TABS
25.0000 mg | ORAL_TABLET | Freq: Three times a day (TID) | ORAL | Status: DC
  Administered 2021-07-02 – 2021-07-06 (×11): 25 mg via ORAL
  Filled 2021-06-30 (×14): qty 1

## 2021-06-30 MED ORDER — ACETAMINOPHEN 500 MG PO TABS
500.0000 mg | ORAL_TABLET | Freq: Four times a day (QID) | ORAL | Status: DC | PRN
  Administered 2021-07-06: 1000 mg via ORAL
  Filled 2021-06-30: qty 2

## 2021-06-30 MED ORDER — ROSUVASTATIN CALCIUM 20 MG PO TABS
40.0000 mg | ORAL_TABLET | Freq: Every day | ORAL | Status: DC
  Administered 2021-07-01 – 2021-07-06 (×5): 40 mg via ORAL
  Filled 2021-06-30 (×6): qty 2

## 2021-06-30 MED ORDER — HYDROCHLOROTHIAZIDE 12.5 MG PO TABS
12.5000 mg | ORAL_TABLET | Freq: Every day | ORAL | Status: DC
  Administered 2021-07-01 – 2021-07-06 (×5): 12.5 mg via ORAL
  Filled 2021-06-30 (×6): qty 1

## 2021-06-30 NOTE — ED Notes (Signed)
Pt was sitting in the back hall of triage waiting placement for a room, security received call that a women in scrubs was wondering outside in scrubs with no shoes. Unable to locate pt, security looking for pt and [employer] made aware and looking for pt.

## 2021-06-30 NOTE — ED Provider Notes (Addendum)
[facility] EMERGENCY DEPARTMENT Provider Note   CSN: [id number] Arrival date & time: 06/30/21  1533     History Chief Complaint  Patient presents with   Psychiatric Evaluation    Gail Lynch is a 80 y.o. female.  HPI  Patient presents due to IVC.  Level 5 caveat applies secondary to patient's psychiatric state.  Per the IVC note, patient with history of dementia has been erratic.  She is leaving the house and trying to break into the neighbor's homes.  She is having visual hallucinations seeing things in the trees.  Denies any suicidal ideations or homicidal ideations.  Her behavior is just unsafe for her as well as feather surrounding her.  Past Medical History:  Diagnosis Date   Acute lower GI bleeding 05/03/2020   Arthritis    gout in feet   Asthma    hx as a child - no inhaler, no problems   Carotid stenosis 06/18/2012   Colon polyps    Diabetes mellitus    Type 2   Dysplasia of cervix, low grade (CIN 1)    s/p cervical conization   Glaucoma    Glaucoma 12/18/2011   Hard of hearing    hear aids - left   HPV (human papilloma virus) infection    Hyperlipidemia    Hypertension    Intrinsic asthma, unspecified 12/18/2011   Mild aortic stenosis 03/14/2017   Mycotic toenails 02/19/2013   PONV (postoperative nausea and vomiting)    SVD (spontaneous vaginal delivery)    x 4    Patient Active Problem List   Diagnosis Date Noted   Non compliance with medical treatment 07/31/2020   Psychosis in elderly ([place]) 07/29/2020   Diabetic leg ulcer ([place]) 07/29/2020   Benign neoplasm of ascending colon    Benign neoplasm of transverse colon    Benign neoplasm of descending colon    Benign neoplasm of sigmoid colon    History of colon polyps 05/04/2020   Complete AV block ([place]) 03/09/2020   Mild aortic stenosis 03/14/2017   Peripheral edema 03/14/2017   Arm heaviness 03/14/2017   CKD (chronic kidney disease), stage III ([place]) 05/20/2016   Diabetes ([place])  01/03/2016   Dysuria 12/09/2014   Peripheral neuropathy 12/05/2013   Irregular heart beat 12/05/2013   PVC (premature ventricular contraction) 12/05/2013   Sciatica 06/17/2013   Pain due to onychomycosis of toenail 05/21/2013   Mycotic toenails    Vagina bleeding 08/06/2012   Carotid stenosis 06/18/2012   Constipation 04/30/2012   Childhood asthma 12/18/2011   Glaucoma 12/18/2011   Hyperlipidemia 12/18/2011   Colon polyps 12/18/2011   Preventative health care 12/17/2011   Hypertension 12/17/2011   Hard of hearing    Dysplasia of cervix, low grade (CIN 1)    HPV (human papilloma virus) infection    Brain mass 09/18/2011    Past Surgical History:  Procedure Laterality Date   BREAST BIOPSY  2010   BREAST BIOPSY  1991   COLONOSCOPY     COLONOSCOPY WITH PROPOFOL N/A 05/06/2020   Procedure: COLONOSCOPY WITH PROPOFOL;  Surgeon: Jerene Bears, MD;  Location: [facility];  Service: Endoscopy;  Laterality: N/A;   DILATION AND CURETTAGE OF UTERUS  2000   HYSTEROSCOPY WITH D & C N/A 03/04/2014   Procedure: DILATATION AND CURETTAGE /HYSTEROSCOPY;  Surgeon: Lavonia Drafts, MD;  Location: [facility];  Service: Gynecology;  Laterality: N/A;   KNEE SURGERY  2010   patella fracture   MULTIPLE TOOTH EXTRACTIONS  PACEMAKER IMPLANT N/A 03/09/2020   Procedure: PACEMAKER IMPLANT;  Surgeon: Constance Haw, MD;  Location: [facility] CV LAB;  Service: Cardiovascular;  Laterality: N/A;   POLYPECTOMY     POLYPECTOMY  05/06/2020   Procedure: POLYPECTOMY;  Surgeon: Jerene Bears, MD;  Location: [facility] ENDOSCOPY;  Service: Endoscopy;;     OB History     Gravida  3   Para  3   Term  3   Preterm  0   AB  0   Living  3      SAB  0   IAB  0   Ectopic  0   Multiple  0   Live Births              Family History  Problem Relation Age of Onset   Diabetes Brother    Heart disease Brother    Heart disease Mother    Heart disease Father    Asthma Other    Colon cancer Neg  Hx    Rectal cancer Neg Hx    Stomach cancer Neg Hx    Esophageal cancer Neg Hx     Social History   Tobacco Use   Smoking status: Former    Types: Cigarettes    Quit date: 08/21/1980    Years since quitting: 40.8   Smokeless tobacco: Former  Scientific laboratory technician Use: Never used  Substance Use Topics   Alcohol use: No    Alcohol/week: 0.0 standard drinks   Drug use: No    Home Medications Prior to Admission medications   Medication Sig Start Date End Date Taking? Authorizing Provider  allopurinol (ZYLOPRIM) 300 MG tablet Take 1 tablet (300 mg total) by mouth 2 (two) times daily. 02/04/21   Biagio Borg, MD  diazepam (VALIUM) 5 MG tablet Take 1 tablet (5 mg total) by mouth every 6 (six) hours as needed for anxiety. 03/15/21   Biagio Borg, MD  glucose blood [facility] VERIO) test strip 1 each by Other route 2 (two) times daily. And lancets 2/day 08/03/20   Renato Shin, MD  hydrALAZINE (APRESOLINE) 25 MG tablet Take 1 tablet (25 mg total) by mouth every 8 (eight) hours. 02/04/21   Biagio Borg, MD  hydrochlorothiazide (MICROZIDE) 12.5 MG capsule Take 1 capsule (12.5 mg total) by mouth daily. 02/04/21 02/04/22  Biagio Borg, MD  Insulin NPH, Human,, Isophane, (HUMULIN N KWIKPEN) 100 UNIT/ML Kiwkpen Inject 50 Units into the skin every morning. 08/03/20   Renato Shin, MD  Lancets [facility] DELICA PLUS [id number]) [place] Test bid 12/28/20   [provider]  rosuvastatin (CRESTOR) 40 MG tablet Take 1 tablet (40 mg total) by mouth daily. 02/04/21   Biagio Borg, MD    Allergies    Aspirin and Penicillins  Review of Systems   Review of Systems  Unable to perform ROS: Psychiatric disorder   Physical Exam Updated Vital Signs There were no vitals taken for this visit.  Physical Exam Vitals and nursing note reviewed. Exam conducted with a chaperone present.  Constitutional:      General: She is not in acute distress.    Appearance: Normal appearance.  HENT:     Head:  Normocephalic and atraumatic.  Eyes:     General: No scleral icterus.    Extraocular Movements: Extraocular movements intact.     Pupils: Pupils are equal, round, and reactive to light.  Skin:    Coloration: Skin is not jaundiced.  Neurological:     Mental Status: She is alert. Mental status is at baseline.     Coordination: Coordination normal.  Psychiatric:        Attention and Perception: Attention normal. She perceives visual hallucinations.        Speech: Speech normal.        Behavior: Behavior is cooperative.        Thought Content: Thought content is paranoid and delusional.        Cognition and Memory: Cognition is impaired.        Judgment: Judgment is impulsive.    ED Results / Procedures / Treatments   Labs (all labs ordered are listed, but only abnormal results are displayed) Labs Reviewed - No data to display  EKG None  Radiology DG Chest 2 View  Result Date: 06/29/2021 CLINICAL DATA:  [place] today.  Left shoulder pain. EXAM: CHEST - 2 VIEW COMPARISON:  03/10/2020 FINDINGS: Heart size is normal. There is aortic atherosclerosis. Dual lead pacemaker is in place, apparently changed since the prior study. Mild bibasilar scarring as seen previously. No acute bone finding. IMPRESSION: No acute finding. Chronic basilar pulmonary scarring. Pacemaker appears different than on the previous study, presumably changed. If not, ventricular lead may have dislodged. Electronically Signed   By: Nelson Chimes M.D.   On: 06/29/2021 18:55   DG Shoulder Left  Result Date: 06/29/2021 CLINICAL DATA:  [place] today.  Left shoulder pain. EXAM: LEFT SHOULDER - 2+ VIEW COMPARISON:  None. FINDINGS: No evidence of fracture or dislocation. Mild chronic degenerative changes of the glenohumeral joint and AC joint. IMPRESSION: No acute or traumatic finding.  Mild degenerative changes. Electronically Signed   By: Nelson Chimes M.D.   On: 06/29/2021 18:56    Procedures Procedures   Medications Ordered in  ED Medications - No data to display  ED Course  I have reviewed the triage vital signs and the nursing notes.  Pertinent labs & imaging results that were available during my care of the patient were reviewed by me and considered in my medical decision making (see chart for details).    MDM Rules/Calculators/A&P                           Patient is mildly hypertensive, vitals are otherwise stable.  She is nontoxic-appearing.  She is calm and cooperative with me, patient is currently under IVC due to history as stated above.  We will medically clear her and plan for psych evaluation by TTS.  At this point patient is medically cleared and appropriate for TTS evaluation.  Final Clinical Impression(s) / ED Diagnoses Final diagnoses:  None    Rx / DC Orders ED Discharge Orders     None        Sherrill Raring, PA-C 06/30/21 [place], [place], PA-C 06/30/21 [place], [place], DO 07/04/21 [phone]

## 2021-06-30 NOTE — ED Triage Notes (Signed)
Pt here via [employer], IVC'd by daughter. Pt has rec dx of dementia and elderly psychosis. Per daughter pt has been wandering in streets, seeing children in trees, kicking cars, leaving the stove on at home when she leaves, trying to stand in the streets w/ heavy traffic. Pt denies HI/SI, denies any complaints. Cooperative in triage

## 2021-07-01 ENCOUNTER — Telehealth: Payer: Self-pay

## 2021-07-01 DIAGNOSIS — I129 Hypertensive chronic kidney disease with stage 1 through stage 4 chronic kidney disease, or unspecified chronic kidney disease: Secondary | ICD-10-CM | POA: Diagnosis not present

## 2021-07-01 LAB — RAPID URINE DRUG SCREEN, HOSP PERFORMED
Amphetamines: NOT DETECTED
Barbiturates: NOT DETECTED
Benzodiazepines: NOT DETECTED
Cocaine: NOT DETECTED
Opiates: NOT DETECTED
Tetrahydrocannabinol: NOT DETECTED

## 2021-07-01 MED ORDER — QUETIAPINE FUMARATE 25 MG PO TABS
25.0000 mg | ORAL_TABLET | Freq: Every day | ORAL | Status: DC
  Administered 2021-07-02 – 2021-07-05 (×4): 25 mg via ORAL
  Filled 2021-07-01 (×7): qty 1

## 2021-07-01 MED ORDER — LORAZEPAM 2 MG/ML IJ SOLN
1.0000 mg | Freq: Once | INTRAMUSCULAR | Status: AC
  Administered 2021-07-01: 1 mg via INTRAMUSCULAR
  Filled 2021-07-01: qty 1

## 2021-07-01 MED ORDER — HALOPERIDOL LACTATE 5 MG/ML IJ SOLN
2.0000 mg | Freq: Once | INTRAMUSCULAR | Status: DC
  Filled 2021-07-01: qty 1

## 2021-07-01 MED ORDER — COVID-19 MRNA VAC-TRIS(PFIZER) 30 MCG/0.3ML IM SUSP
0.3000 mL | Freq: Once | INTRAMUSCULAR | Status: AC
  Administered 2021-07-04: 0.3 mL via INTRAMUSCULAR
  Filled 2021-07-01 (×2): qty 0.3

## 2021-07-01 MED ORDER — LORAZEPAM 2 MG/ML IJ SOLN
INTRAMUSCULAR | Status: AC
  Administered 2021-07-01: 2 mg via INTRAMUSCULAR
  Filled 2021-07-01: qty 1

## 2021-07-01 MED ORDER — LORAZEPAM 2 MG/ML IJ SOLN: 2.0000 mg | Freq: Once | INTRAMUSCULAR | Status: AC

## 2021-07-01 NOTE — BH Assessment (Signed)
Clinician messaged Maricela Bo. Eligah East, RN: "Hey. It's Trey with TTS. Is the pt able to engage in assessment? Can you fax her IVC paperwork [phone]) to me?"    Clinician awaiting response.    Vertell Novak, [place], [facility], [facility] Triage Specialist [phone]

## 2021-07-01 NOTE — ED Provider Notes (Addendum)
Emergency Medicine Observation Re-evaluation Note  Gail Lynch is a 80 y.o. female, seen on rounds today.  Pt initially presented to the ED for complaints of Psychiatric Evaluation Currently, the patient is agitated and trying to elope.  Physical Exam  BP (!) 157/74 (BP Location: Left Arm)   Pulse 74   Temp 98.4 F (36.9 C) (Oral)   Resp 16   SpO2 100%  Physical Exam General: Acutely agitated requiring physical restraint Cardiac: Tachycardic, well perfused Lungs: Respirations even and unlabored Psych: Acutely agitated requiring physical and chemical restraint  ED Course / MDM  EKG:EKG Interpretation  Date/Time:  Thursday June 30 2021 16:33:33 EST Ventricular Rate:  92 PR Interval:  164 QRS Duration: 74 QT Interval:  364 QTC Calculation: 450 R Axis:   58 Text Interpretation: Normal sinus rhythm Low voltage QRS ST & T wave abnormality, consider inferior ischemia Abnormal ECG When compared with ECG of 06/29/2021, Sinus rhythm has replaced VENTRICULAR PACED RHYTHM Confirmed by Delora Fuel ([id number]) on 07/01/2021 2:27:51 AM  I have reviewed the labs performed to date as well as medications administered while in observation.  Recent changes in the last 24 hours include patient became agitated and attempted to leave the emergency department.  7:24 PM I was called to bedside as multiple staff members were requiring to restrain the patient due to patient agitation.  The patient reportedly tried to elope from the emergency department.  She is currently under IVC by family members for wandering behaviors, safety concerns, hallucinations.  She does have a history of dementia.  She has been having visual hallucinations.  Chart review, psychiatry had recommended low-dose Seroquel 25 mg p.o. 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  Social work had been coordinating LTAC placement efforts.  Patient reportedly has not had a safe disposition at this time any TOC consult was placed for care  coordination for ultimate safe discharge.  Patient administered 2 mg IM Ativan and 2 mg IM Haldol for acute agitation as she is refusing oral meds.  We will continue to monitor.  .Critical Care Performed by: Regan Lemming, MD Authorized by: Regan Lemming, MD   Critical care provider statement:    Critical care time (minutes):  32   Critical care was time spent personally by me on the following activities:  Re-evaluation of patient's condition, review of old charts, ordering and performing treatments and interventions, examination of patient and evaluation of patient's response to treatment   Plan  Current plan is for psychiatrically cleared patient suspected dementia with sundowning.  TOC consult placed for safe discharge plan. Gail Lynch is under involuntary commitment.      Regan Lemming, MD 07/01/21 Gail Lynch    Regan Lemming, MD 07/01/21 Gail Lynch

## 2021-07-01 NOTE — BH Assessment (Signed)
Clinician attempted to engage pt in TTS assessment however pt continued to dose off. Clinician observed nurse tech try to engage the pt in assessment to no avail.   Clinician messaged Maricela Bo. Ferrainolo, RN: " She kept dosing off unable to engage. We'll see her when she's more alert. Did she receive ann medications?"   RN replied: "yes she got ativan after she eloped from the ED earlier."   Vertell Novak, [place], [facility], [facility] Triage Specialist [phone]

## 2021-07-01 NOTE — Progress Notes (Signed)
Per Suella Broad, FNP pt has been psych cleared and pt is now recommended for SNF placement. This CSW will remove pt from [facility] shift report and TOC will follow and assist pt with SNF placement.  Benjaman Kindler, MSW, LCSWA 07/01/2021 8:20 PM

## 2021-07-01 NOTE — Progress Notes (Signed)
    Chronic Care Management Pharmacy Assistant   Name: Weslee Prestage  MRN: [id number] DOB: [birth year]  Gail Lynch is an 80 y.o. year old female who presents for his initial CCM visit with the clinical pharmacist.  Reason for Encounter: Initial Visit   Recent office visits:  None ID  Recent consult visits:  601/22 Gardiner Barefoot, DPM-Podiatry (Pain due to onychomycosis of toenail) no orders or med changes  Hospital visits:  Medication Reconciliation was completed by comparing discharge summary, patient's EMR and Pharmacy list, and upon discussion with patient.  Admitted to the hospital on 06/30/21 due to psychosis. Discharge date was present at [facility].    2. Admitted to the hospital on 06/29/21 due to fall. Discharge date was 06/29/21 at [facility].    3. Admitted to the hospital on 04/22/21 due to Dementia. Discharge date was 04/22/21 at [facility].   4.   Admitted to the hospital on 01/21/21 due to Psychosis. Discharge date was 01/24/21 at [facility].  Medications that remain the same after Hospital Discharge:??  -All other medications will remain the same.    Medications: Facility-Administered Encounter Medications as of 07/01/2021  Medication   acetaminophen (TYLENOL) tablet 500-1,000 mg   hydrALAZINE (APRESOLINE) tablet 25 mg   hydrochlorothiazide (HYDRODIURIL) tablet 12.5 mg   QUEtiapine (SEROQUEL) tablet 25 mg   rosuvastatin (CRESTOR) tablet 40 mg   Outpatient Encounter Medications as of 07/01/2021  Medication Sig   acetaminophen (TYLENOL) 500 MG tablet Take 500-1,000 mg by mouth every 6 (six) hours as needed for mild pain or headache.   allopurinol (ZYLOPRIM) 300 MG tablet Take 1 tablet (300 mg total) by mouth 2 (two) times daily.   diazepam (VALIUM) 5 MG tablet Take 1 tablet (5 mg total) by mouth every 6 (six) hours as needed for anxiety. (Patient not taking: Reported on 06/30/2021)   glucose blood (ONETOUCH VERIO) test strip 1  each by Other route 2 (two) times daily. And lancets 2/day   hydrALAZINE (APRESOLINE) 25 MG tablet Take 1 tablet (25 mg total) by mouth every 8 (eight) hours. (Patient not taking: Reported on 06/30/2021)   hydrochlorothiazide (MICROZIDE) 12.5 MG capsule Take 1 capsule (12.5 mg total) by mouth daily. (Patient not taking: Reported on 06/30/2021)   Insulin NPH, Human,, Isophane, (HUMULIN N KWIKPEN) 100 UNIT/ML Kiwkpen Inject 50 Units into the skin every morning. (Patient not taking: Reported on 06/30/2021)   Lancets (ONETOUCH DELICA PLUS [id number]) MISC Test bid   rosuvastatin (CRESTOR) 40 MG tablet Take 1 tablet (40 mg total) by mouth daily. (Patient not taking: Reported on 06/30/2021)   Medication List: acetaminophen (TYLENOL) 500-1,000MG  tablet-last fill 06/30/21 allopurinol (ZYLOPRIM) 300 MG tablet-last fill 02/04/21 45 ds diazepam (VALIUM) 5 MG tablet-last fill 03/15/21 1 ds glucose blood (ONETOUCH VERIO) test strip hydrALAZINE (APRESOLINE) 25 MG tablet-last fill 02/04/21 30 ds hydrochlorothiazide (MICROZIDE) 12.5 MG capsule-last fill 02/04/21 90 ds Insulin NPH, Human,, Isophane, (HUMULIN N KWIKPEN) 100 UNIT/ML Kiwkpen Lancets (ONETOUCH DELICA PLUS [id number]) MISC rosuvastatin (CRESTOR) 40 MG tablet-last fill 02/04/21 90 ds  Care Gaps: Colonoscopy-05/06/20 Diabetic Foot Exam-06/21/20 Mammogram-NA Ophthalmology-08/28/18 Dexa Scan - NA Annual Well Visit - NA Micro albumin-NA Hemoglobin A1c-01/21/21  Star Rating Drugs: rosuvastatin (CRESTOR) 40 MG tablet-last fill 02/04/21 90 ds  Ethelene Hal Clinical Pharmacist Assistant [phone]

## 2021-07-01 NOTE — ED Notes (Signed)
Pt became agitated with sitter, attempted to run out of room and through triage doors to lobby. Sitter stopped pt by holding her hands, this RN and RN Josh, Tyrone assisted pt back to room via rolling chair. Pt placed back in bed. Security at bedside to assist. MD Lawsing at bedside and ordered appropriate medications for pt. Will admin as ordered
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
## 2021-07-01 NOTE — Progress Notes (Addendum)
CSW called  [facility] in an attempt to confirm bed offer/ acceptance. Unable to make contact with anyone in the facility.
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
## 2021-07-01 NOTE — NC FL2 (Signed)
[place] LEVEL OF CARE SCREENING TOOL     IDENTIFICATION  Patient Name: Gail Lynch Birthdate: 1941-05-07 Sex: female Admission Date (Current Location): 06/30/2021  [facility] and [place] Number:  Herbalist and Address:  [facility] [place]. [facility], [place] [address], [place], [place] [address]      Provider Number: [id number]  Attending Physician Name and Address:  Default, Provider, MD  Relative Name and Phone Number:  Timoteo Gaul, [phone], APS Legal Guardian    Current Level of Care: SNF Recommended Level of Care: [place], [place] Prior Approval Number:    Date Approved/Denied:   PASRR Number: [id number]  Discharge Plan: SNF    Current Diagnoses: Patient Active Problem List   Diagnosis Date Noted   Non compliance with medical treatment 07/31/2020   Psychosis in elderly [facility]) 07/29/2020   Diabetic leg ulcer ([place]) 07/29/2020   Benign neoplasm of ascending colon    Benign neoplasm of transverse colon    Benign neoplasm of descending colon    Benign neoplasm of sigmoid colon    History of colon polyps 05/04/2020   Complete AV block ([place]) 03/09/2020   Mild aortic stenosis 03/14/2017   Peripheral edema 03/14/2017   Arm heaviness 03/14/2017   CKD (chronic kidney disease), stage III ([place]) 05/20/2016   Diabetes ([place]) 01/03/2016   Dysuria 12/09/2014   Peripheral neuropathy 12/05/2013   Irregular heart beat 12/05/2013   PVC (premature ventricular contraction) 12/05/2013   Sciatica 06/17/2013   Pain due to onychomycosis of toenail 05/21/2013   Mycotic toenails    Vagina bleeding 08/06/2012   Carotid stenosis 06/18/2012   Constipation 04/30/2012   Childhood asthma 12/18/2011   Glaucoma 12/18/2011   Hyperlipidemia 12/18/2011   Colon polyps 12/18/2011   Preventative health care 12/17/2011   Hypertension 12/17/2011   Hard of hearing    Dysplasia of cervix, low grade (CIN 1)     HPV (human papilloma virus) infection    Brain mass 09/18/2011    Orientation RESPIRATION BLADDER Height & Weight     Self, Time, Situation, Place  Normal Continent Weight:   Height:     BEHAVIORAL SYMPTOMS/MOOD NEUROLOGICAL BOWEL NUTRITION STATUS  [place]   Continent    AMBULATORY STATUS COMMUNICATION OF NEEDS Skin   Limited Assist Verbally Normal                       Personal Care Assistance Level of Assistance  Bathing, Dressing, Feeding Bathing Assistance: Limited assistance Feeding assistance: Independent Dressing Assistance: Limited assistance     Functional Limitations Info  Sight, Hearing, Speech Sight Info: Impaired Hearing Info: Impaired Speech Info: Adequate    SPECIAL CARE FACTORS FREQUENCY                       Contractures Contractures Info: Not present    Additional Factors Info  Code Status, Allergies Code Status Info: Full Allergies Info: Aspirin, Penicillins           Current Medications (07/01/2021):  This is the current hospital active medication list Current Facility-Administered Medications  Medication Dose Route Frequency Provider Last Rate Last Admin   acetaminophen (TYLENOL) tablet 500-1,000 mg  500-1,000 mg Oral Q6H PRN Sherrill Raring, PA-C       hydrALAZINE (APRESOLINE) tablet 25 mg  25 mg Oral Q8H Sage, Haley, PA-C       hydrochlorothiazide (HYDRODIURIL) tablet 12.5 mg  12.5 mg  Oral Daily Sherrill Raring, PA-C   12.5 mg at 07/01/21 1434   QUEtiapine (SEROQUEL) tablet 25 mg  25 mg Oral QHS Suella Broad, FNP       rosuvastatin (CRESTOR) tablet 40 mg  40 mg Oral Daily Sherrill Raring, PA-C   40 mg at 07/01/21 1434   Current Outpatient Medications  Medication Sig Dispense Refill   acetaminophen (TYLENOL) 500 MG tablet Take 500-1,000 mg by mouth every 6 (six) hours as needed for mild pain or headache.     allopurinol (ZYLOPRIM) 300 MG tablet Take 1 tablet (300 mg total) by mouth 2 (two) times daily. 90 tablet 3   diazepam  (VALIUM) 5 MG tablet Take 1 tablet (5 mg total) by mouth every 6 (six) hours as needed for anxiety. (Patient not taking: Reported on 06/30/2021) 1 tablet 0   glucose blood (ONETOUCH VERIO) test strip 1 each by Other route 2 (two) times daily. And lancets 2/day 200 each 12   hydrALAZINE (APRESOLINE) 25 MG tablet Take 1 tablet (25 mg total) by mouth every 8 (eight) hours. (Patient not taking: Reported on 06/30/2021) 90 tablet 3   hydrochlorothiazide (MICROZIDE) 12.5 MG capsule Take 1 capsule (12.5 mg total) by mouth daily. (Patient not taking: Reported on 06/30/2021) 90 capsule 3   Insulin NPH, Human,, Isophane, (HUMULIN N KWIKPEN) 100 UNIT/ML Kiwkpen Inject 50 Units into the skin every morning. (Patient not taking: Reported on 06/30/2021) 30 mL 11   Lancets (ONETOUCH DELICA PLUS [id number]) MISC Test bid     rosuvastatin (CRESTOR) 40 MG tablet Take 1 tablet (40 mg total) by mouth daily. (Patient not taking: Reported on 06/30/2021) 90 tablet 3     Discharge Medications: Please see discharge summary for a list of discharge medications.  Relevant Imaging Results:  Relevant Lab Results:   Additional Information SSN #: [id number]  Raina Mina, LCSWA

## 2021-07-01 NOTE — ED Notes (Signed)
Pt found by security and brought back into building, pulled sitter from another pt to sit with her

## 2021-07-02 DIAGNOSIS — I129 Hypertensive chronic kidney disease with stage 1 through stage 4 chronic kidney disease, or unspecified chronic kidney disease: Secondary | ICD-10-CM | POA: Diagnosis not present

## 2021-07-02 NOTE — ED Notes (Signed)
Pt attempted to get out of bed.  Pt assisted back in bed.

## 2021-07-02 NOTE — ED Notes (Signed)
Pt ambulated to restroom with stand by assist.  

## 2021-07-02 NOTE — ED Notes (Signed)
Pt sleeping at this time.

## 2021-07-03 DIAGNOSIS — I129 Hypertensive chronic kidney disease with stage 1 through stage 4 chronic kidney disease, or unspecified chronic kidney disease: Secondary | ICD-10-CM | POA: Diagnosis not present

## 2021-07-03 MED ORDER — STERILE WATER FOR INJECTION IJ SOLN
INTRAMUSCULAR | Status: AC
  Filled 2021-07-03: qty 10

## 2021-07-03 MED ORDER — ZIPRASIDONE MESYLATE 20 MG IM SOLR
15.0000 mg | Freq: Once | INTRAMUSCULAR | Status: AC
  Administered 2021-07-03: 15 mg via INTRAMUSCULAR
  Filled 2021-07-03: qty 20

## 2021-07-03 NOTE — Progress Notes (Signed)
Pt was trying to run, me and nurse was able to get her back to bed, then she try to run again

## 2021-07-03 NOTE — ED Notes (Signed)
Pt awoken for vitals and to give meds.  PT ambulated to bathroom with sitter at side.

## 2021-07-04 ENCOUNTER — Telehealth: Payer: Self-pay | Admitting: Internal Medicine

## 2021-07-04 DIAGNOSIS — I129 Hypertensive chronic kidney disease with stage 1 through stage 4 chronic kidney disease, or unspecified chronic kidney disease: Secondary | ICD-10-CM | POA: Diagnosis not present

## 2021-07-04 DIAGNOSIS — F0390 Unspecified dementia without behavioral disturbance: Secondary | ICD-10-CM

## 2021-07-04 MED ORDER — ZIPRASIDONE MESYLATE 20 MG IM SOLR
10.0000 mg | Freq: Once | INTRAMUSCULAR | Status: AC
  Administered 2021-07-04: 10 mg via INTRAMUSCULAR
  Filled 2021-07-04: qty 20

## 2021-07-04 MED ORDER — STERILE WATER FOR INJECTION IJ SOLN
INTRAMUSCULAR | Status: AC
  Filled 2021-07-04: qty 10

## 2021-07-04 MED ORDER — METFORMIN HCL 500 MG PO TABS
500.0000 mg | ORAL_TABLET | Freq: Two times a day (BID) | ORAL | Status: DC
  Administered 2021-07-04 – 2021-07-06 (×4): 500 mg via ORAL
  Filled 2021-07-04 (×5): qty 1

## 2021-07-04 NOTE — ED Notes (Signed)
Pt repeatedly seen wandering down the hallway. Redirected back to her bed each time. EDP made aware of increase in pt restlessness & agitation. Ice cream given to pt.

## 2021-07-04 NOTE — NC FL2 (Signed)
[place] LEVEL OF CARE SCREENING TOOL     IDENTIFICATION  Patient Name: Gail Lynch Birthdate: 29-Sep-1940 Sex: female Admission Date (Current Location): 06/30/2021  [facility] and [place] Number:  Herbalist and Address:  [facility] [place]. [facility], [place] [address] [place], [place] [address]      Provider Number: [id number]  Attending Physician Name and Address:  Default, Provider, MD  Relative Name and Phone Number:  Timoteo Gaul, [phone], APS Legal Guardian    Current Level of Care: Hospital Recommended Level of Care: [place], Memory Care Prior Approval Number:    Date Approved/Denied:   PASRR Number: [id number]  Discharge Plan: Other (Comment) (Memory Care/Assisted Living)    Current Diagnoses: Patient Active Problem List   Diagnosis Date Noted   Dementia ([place]) 07/04/2021   Non compliance with medical treatment 07/31/2020   Psychosis in elderly ([place]) 07/29/2020   Diabetic leg ulcer ([place]) 07/29/2020   Benign neoplasm of ascending colon    Benign neoplasm of transverse colon    Benign neoplasm of descending colon    Benign neoplasm of sigmoid colon    History of colon polyps 05/04/2020   Complete AV block ([place]) 03/09/2020   Mild aortic stenosis 03/14/2017   Peripheral edema 03/14/2017   Arm heaviness 03/14/2017   CKD (chronic kidney disease), stage III ([place]) 05/20/2016   Diabetes ([place]) 01/03/2016   Dysuria 12/09/2014   Peripheral neuropathy 12/05/2013   Irregular heart beat 12/05/2013   PVC (premature ventricular contraction) 12/05/2013   Sciatica 06/17/2013   Pain due to onychomycosis of toenail 05/21/2013   Mycotic toenails    Vagina bleeding 08/06/2012   Carotid stenosis 06/18/2012   Constipation 04/30/2012   Childhood asthma 12/18/2011   Glaucoma 12/18/2011   Hyperlipidemia 12/18/2011   Colon polyps 12/18/2011   Preventative health care 12/17/2011   Hypertension 12/17/2011   Hard of hearing     Dysplasia of cervix, low grade (CIN 1)    HPV (human papilloma virus) infection    Brain mass 09/18/2011    Orientation RESPIRATION BLADDER Height & Weight     Self, Time, Place  Normal Continent Weight:   Height:     BEHAVIORAL SYMPTOMS/MOOD NEUROLOGICAL BOWEL NUTRITION STATUS  [place]   Continent    AMBULATORY STATUS COMMUNICATION OF NEEDS Skin   Limited Assist Verbally Normal                       Personal Care Assistance Level of Assistance  Bathing, Dressing, Feeding Bathing Assistance: Limited assistance Feeding assistance: Independent Dressing Assistance: Limited assistance     Functional Limitations Info  Sight, Hearing, Speech Sight Info: Impaired Hearing Info: Impaired Speech Info: Adequate    SPECIAL CARE FACTORS FREQUENCY                       Contractures Contractures Info: Not present    Additional Factors Info  Code Status, Allergies Code Status Info: Full Allergies Info: Aspirin, Pencillins           Current Medications (07/04/2021):  This is the current hospital active medication list Current Facility-Administered Medications  Medication Dose Route Frequency Provider Last Rate Last Admin   acetaminophen (TYLENOL) tablet 500-1,000 mg  500-1,000 mg Oral Q6H PRN Sherrill Raring, PA-C       haloperidol lactate (HALDOL) injection 2 mg  2 mg Intramuscular Once Regan Lemming, MD       hydrALAZINE (APRESOLINE)  tablet 25 mg  25 mg Oral Q8H Sherrill Raring, PA-C   25 mg at 07/04/21 1345   hydrochlorothiazide (HYDRODIURIL) tablet 12.5 mg  12.5 mg Oral Daily Sherrill Raring, PA-C   12.5 mg at 07/04/21 5686   metFORMIN (GLUCOPHAGE) tablet 500 mg  500 mg Oral BID WC Palumbo, April, MD   500 mg at 07/04/21 1683   QUEtiapine (SEROQUEL) tablet 25 mg  25 mg Oral QHS Suella Broad, FNP   25 mg at 07/03/21 2125   rosuvastatin (CRESTOR) tablet 40 mg  40 mg Oral Daily Sherrill Raring, PA-C   40 mg at 07/04/21 7290   Current Outpatient Medications   Medication Sig Dispense Refill   acetaminophen (TYLENOL) 500 MG tablet Take 500-1,000 mg by mouth every 6 (six) hours as needed for mild pain or headache.     allopurinol (ZYLOPRIM) 300 MG tablet Take 1 tablet (300 mg total) by mouth 2 (two) times daily. 90 tablet 3   diazepam (VALIUM) 5 MG tablet Take 1 tablet (5 mg total) by mouth every 6 (six) hours as needed for anxiety. (Patient not taking: Reported on 06/30/2021) 1 tablet 0   glucose blood (ONETOUCH VERIO) test strip 1 each by Other route 2 (two) times daily. And lancets 2/day 200 each 12   hydrALAZINE (APRESOLINE) 25 MG tablet Take 1 tablet (25 mg total) by mouth every 8 (eight) hours. (Patient not taking: Reported on 06/30/2021) 90 tablet 3   hydrochlorothiazide (MICROZIDE) 12.5 MG capsule Take 1 capsule (12.5 mg total) by mouth daily. (Patient not taking: Reported on 06/30/2021) 90 capsule 3   Insulin NPH, Human,, Isophane, (HUMULIN N KWIKPEN) 100 UNIT/ML Kiwkpen Inject 50 Units into the skin every morning. (Patient not taking: Reported on 06/30/2021) 30 mL 11   Lancets (ONETOUCH DELICA PLUS [id number]) MISC Test bid     rosuvastatin (CRESTOR) 40 MG tablet Take 1 tablet (40 mg total) by mouth daily. (Patient not taking: Reported on 06/30/2021) 90 tablet 3     Discharge Medications: Please see discharge summary for a list of discharge medications.  Relevant Imaging Results:  Relevant Lab Results:   Additional Information SSN #: [id number]  Vergie Living, LCSW

## 2021-07-04 NOTE — Telephone Encounter (Signed)
Larene Beach from [facility] called and states pt. Daughter states she was diagnosed with Dementia by provider. States she doesn't see diagnosis, and would like a call back to follow- up. States she can't place pt. In [facility] unit, without having documentation of diagnosis.    Please advise.   Callback #- [phone]

## 2021-07-04 NOTE — Progress Notes (Signed)
Updated FL2 faxed to [facility] and .

## 2021-07-04 NOTE — NC FL2 (Signed)
[place] LEVEL OF CARE SCREENING TOOL     IDENTIFICATION  Patient Name: Gail Lynch Birthdate: June 16, 1941 Sex: female Admission Date (Current Location): 06/30/2021  [facility] and [place] Number:  Herbalist and Address:  [facility] [place]. [facility], [place] [address], [place], [place] [address]      Provider Number: [id number]  Attending Physician Name and Address:  Default, Provider, MD  Relative Name and Phone Number:  Timoteo Gaul, [phone], APS Legal Guardian    Current Level of Care: Hospital Recommended Level of Care: [place], Memory Care Prior Approval Number:    Date Approved/Denied:   PASRR Number: [id number]  Discharge Plan: Other (Comment) (Memory Care/Assisted Living)    Current Diagnoses: Patient Active Problem List   Diagnosis Date Noted   Non compliance with medical treatment 07/31/2020   Psychosis in elderly [facility]) 07/29/2020   Diabetic leg ulcer ([place]) 07/29/2020   Benign neoplasm of ascending colon    Benign neoplasm of transverse colon    Benign neoplasm of descending colon    Benign neoplasm of sigmoid colon    History of colon polyps 05/04/2020   Complete AV block ([place]) 03/09/2020   Mild aortic stenosis 03/14/2017   Peripheral edema 03/14/2017   Arm heaviness 03/14/2017   CKD (chronic kidney disease), stage III ([place]) 05/20/2016   Diabetes ([place]) 01/03/2016   Dysuria 12/09/2014   Peripheral neuropathy 12/05/2013   Irregular heart beat 12/05/2013   PVC (premature ventricular contraction) 12/05/2013   Sciatica 06/17/2013   Pain due to onychomycosis of toenail 05/21/2013   Mycotic toenails    Vagina bleeding 08/06/2012   Carotid stenosis 06/18/2012   Constipation 04/30/2012   Childhood asthma 12/18/2011   Glaucoma 12/18/2011   Hyperlipidemia 12/18/2011   Colon polyps 12/18/2011   Preventative health care 12/17/2011   Hypertension 12/17/2011   Hard of hearing    Dysplasia of cervix, low  grade (CIN 1)    HPV (human papilloma virus) infection    Brain mass 09/18/2011    Orientation RESPIRATION BLADDER Height & Weight     Self, Time, Place  Normal Continent Weight:   Height:     BEHAVIORAL SYMPTOMS/MOOD NEUROLOGICAL BOWEL NUTRITION STATUS  [place]   Continent    AMBULATORY STATUS COMMUNICATION OF NEEDS Skin   Limited Assist Verbally Normal                       Personal Care Assistance Level of Assistance  Bathing, Dressing, Feeding Bathing Assistance: Limited assistance Feeding assistance: Independent Dressing Assistance: Limited assistance     Functional Limitations Info  Sight, Hearing, Speech Sight Info: Impaired Hearing Info: Impaired Speech Info: Adequate    SPECIAL CARE FACTORS FREQUENCY                       Contractures Contractures Info: Not present    Additional Factors Info  Code Status, Allergies Code Status Info: Full Allergies Info: Aspirin, Pencillins           Current Medications (07/04/2021):  This is the current hospital active medication list Current Facility-Administered Medications  Medication Dose Route Frequency Provider Last Rate Last Admin   acetaminophen (TYLENOL) tablet 500-1,000 mg  500-1,000 mg Oral Q6H PRN Sherrill Raring, PA-C       haloperidol lactate (HALDOL) injection 2 mg  2 mg Intramuscular Once Regan Lemming, MD       hydrALAZINE (APRESOLINE) tablet 25 mg  25  mg Oral Q8H Sherrill Raring, PA-C   25 mg at 07/04/21 1345   hydrochlorothiazide (HYDRODIURIL) tablet 12.5 mg  12.5 mg Oral Daily Sherrill Raring, PA-C   12.5 mg at 07/04/21 4356   metFORMIN (GLUCOPHAGE) tablet 500 mg  500 mg Oral BID WC Palumbo, April, MD   500 mg at 07/04/21 8616   QUEtiapine (SEROQUEL) tablet 25 mg  25 mg Oral QHS Suella Broad, FNP   25 mg at 07/03/21 2125   rosuvastatin (CRESTOR) tablet 40 mg  40 mg Oral Daily Sherrill Raring, PA-C   40 mg at 07/04/21 8372   Current Outpatient Medications  Medication Sig Dispense Refill    acetaminophen (TYLENOL) 500 MG tablet Take 500-1,000 mg by mouth every 6 (six) hours as needed for mild pain or headache.     allopurinol (ZYLOPRIM) 300 MG tablet Take 1 tablet (300 mg total) by mouth 2 (two) times daily. 90 tablet 3   diazepam (VALIUM) 5 MG tablet Take 1 tablet (5 mg total) by mouth every 6 (six) hours as needed for anxiety. (Patient not taking: Reported on 06/30/2021) 1 tablet 0   glucose blood (ONETOUCH VERIO) test strip 1 each by Other route 2 (two) times daily. And lancets 2/day 200 each 12   hydrALAZINE (APRESOLINE) 25 MG tablet Take 1 tablet (25 mg total) by mouth every 8 (eight) hours. (Patient not taking: Reported on 06/30/2021) 90 tablet 3   hydrochlorothiazide (MICROZIDE) 12.5 MG capsule Take 1 capsule (12.5 mg total) by mouth daily. (Patient not taking: Reported on 06/30/2021) 90 capsule 3   Insulin NPH, Human,, Isophane, (HUMULIN N KWIKPEN) 100 UNIT/ML Kiwkpen Inject 50 Units into the skin every morning. (Patient not taking: Reported on 06/30/2021) 30 mL 11   Lancets (ONETOUCH DELICA PLUS [id number]) MISC Test bid     rosuvastatin (CRESTOR) 40 MG tablet Take 1 tablet (40 mg total) by mouth daily. (Patient not taking: Reported on 06/30/2021) 90 tablet 3     Discharge Medications: Please see discharge summary for a list of discharge medications.  Relevant Imaging Results:  Relevant Lab Results:   Additional Information SSN #: [id number]  Raina Mina, LCSWA

## 2021-07-04 NOTE — ED Provider Notes (Signed)
Emergency Medicine Observation Re-evaluation Note  Gail Lynch is a 80 y.o. female, seen on rounds today at 0700.  Pt initially presented to the ED for complaints of Psychiatric Evaluation Currently, the patient is asleep, resting comfortably.  Physical Exam  BP (!) 147/54 (BP Location: Left Arm)   Pulse 66   Temp 97.8 F (36.6 C) (Oral)   Resp 17   SpO2 100%  Physical Exam General: NAD   ED Course / MDM  EKG:EKG Interpretation  Date/Time:  Thursday June 30 2021 16:33:33 EST Ventricular Rate:  92 PR Interval:  164 QRS Duration: 74 QT Interval:  364 QTC Calculation: 450 R Axis:   58 Text Interpretation: Normal sinus rhythm Low voltage QRS ST & T wave abnormality, consider inferior ischemia Abnormal ECG When compared with ECG of 06/29/2021, Sinus rhythm has replaced VENTRICULAR PACED RHYTHM Confirmed by Delora Fuel ([id number]) on 07/01/2021 2:27:51 AM  I have reviewed the labs performed to date as well as medications administered while in observation.  Recent changes in the last 24 hours include no acute events reported.  Plan  Current plan is for Safe Discharge / SNF placement. Psychiatry cleared. Patient with history of dementia. Gail Lynch is under involuntary commitment.      Gail Merino, MD 07/04/21 [phone]

## 2021-07-04 NOTE — ED Notes (Signed)
1:1 safety sitter present with pt at this time. States to this RN that she will be with the pt until 2300.

## 2021-07-04 NOTE — Progress Notes (Signed)
[facility] and [facility] are willing to review patient. [facility] also stated they would look over patient. [facility] wants patient to be without her sitter for 24 hours. Hospital is not willing to release the sitter due to safety concerns. Patient will not be able to go to [facility].

## 2021-07-04 NOTE — ED Notes (Signed)
Pt COVID vaccine card given to Larene Beach, Education officer, museum.

## 2021-07-04 NOTE — Progress Notes (Signed)
Message left with [facility]

## 2021-07-05 DIAGNOSIS — I129 Hypertensive chronic kidney disease with stage 1 through stage 4 chronic kidney disease, or unspecified chronic kidney disease: Secondary | ICD-10-CM | POA: Diagnosis not present

## 2021-07-05 NOTE — ED Notes (Signed)
Pt still too lethargic from her geodon dose night shift administered. She will not stay awake to eat her breakfast. Will attempt morning meds after she wakes up.

## 2021-07-05 NOTE — Telephone Encounter (Signed)
Shannon notified.  °

## 2021-07-05 NOTE — ED Notes (Signed)
Ambulating in hallway with sitter at this time

## 2021-07-05 NOTE — Progress Notes (Signed)
Patient has a potential bed at [facility] in Memory care unit for Thursday morning.

## 2021-07-05 NOTE — ED Notes (Signed)
Pt has woken up & refused to take all of her PO meds. Stating that she is held here against her will and does not need the medications.

## 2021-07-05 NOTE — ED Notes (Signed)
Able to answer all questions appropriately except for why she is in the hospital. Denies any needs. Sitter at bedside

## 2021-07-05 NOTE — ED Notes (Signed)
ED stretcher changed out for hospital bed.

## 2021-07-05 NOTE — ED Notes (Signed)
Received verbal report from [place] at this time

## 2021-07-05 NOTE — ED Notes (Signed)
Pt refusing Metformin.  Sts "I'm trying to get off my diabetes medication.  I've been on it for ump-teen years.  I know when it's high and when I need to eat something sweet."

## 2021-07-05 NOTE — ED Provider Notes (Signed)
Emergency Medicine Observation Re-evaluation Note  Samyah Bilbo is a 80 y.o. female, seen on rounds today at 0700.  Pt initially presented to the ED for complaints of Psychiatric Evaluation Currently, the patient is resting comfortably, asleep in hallway bed.  Physical Exam  BP 129/72 (BP Location: Right Arm)   Pulse 73   Temp 97.7 F (36.5 C) (Oral)   Resp 14   SpO2 100%  Physical Exam General: NAD   ED Course / MDM  EKG:EKG Interpretation  Date/Time:  Thursday June 30 2021 16:33:33 EST Ventricular Rate:  92 PR Interval:  164 QRS Duration: 74 QT Interval:  364 QTC Calculation: 450 R Axis:   58 Text Interpretation: Normal sinus rhythm Low voltage QRS ST & T wave abnormality, consider inferior ischemia Abnormal ECG When compared with ECG of 06/29/2021, Sinus rhythm has replaced VENTRICULAR PACED RHYTHM Confirmed by Delora Fuel ([id number]) on 07/01/2021 2:27:51 AM  I have reviewed the labs performed to date as well as medications administered while in observation.  Recent changes in the last 24 hours include no acute events reported.  Plan  Current plan is for Safe discharge / SNF placement. Psych cleared. Patient with history of dementia. Lavetta Geier is under involuntary commitment.      Valarie Merino, MD 07/05/21 [phone]

## 2021-07-05 NOTE — Telephone Encounter (Signed)
I dont see this as well bc I did not diagnosie the patient with dementia  There is mention of dementia at a sept 2022 ED visit, and then placed as a diagnosis on the patient problem list just yesterday by Dr Reita May who is an emergency room specialist.  Hopefully this is is the daughter needs.   thanks

## 2021-07-06 ENCOUNTER — Telehealth: Payer: Medicare Other

## 2021-07-06 DIAGNOSIS — I129 Hypertensive chronic kidney disease with stage 1 through stage 4 chronic kidney disease, or unspecified chronic kidney disease: Secondary | ICD-10-CM | POA: Diagnosis not present

## 2021-07-06 MED ORDER — LORAZEPAM 2 MG/ML IJ SOLN
2.0000 mg | Freq: Once | INTRAMUSCULAR | Status: AC
  Administered 2021-07-06: 2 mg via INTRAMUSCULAR
  Filled 2021-07-06: qty 1

## 2021-07-06 NOTE — Progress Notes (Signed)
Legal Guardian, Timoteo Gaul started admissions packet with [facility]. Plan is to discharge tomorrow if paperwork gets completed.

## 2021-07-06 NOTE — Progress Notes (Unsigned)
Chronic Care Management Pharmacy Note  07/06/2021 Name:  Gail Lynch MRN:  [id number] DOB:  02-Feb-1941  Summary: ***  Recommendations/Changes made from today's visit: ***  Plan: ***  Subjective: Gail Lynch is an 80 y.o. year old female who is a primary patient of Jenny Reichmann, Hunt Oris, MD.  The CCM team was consulted for assistance with disease management and care coordination needs.    {CCMTELEPHONEFACETOFACE:21091510} for {CCMINITIALFOLLOWUPCHOICE:21091511} in response to provider referral for pharmacy case management and/or care coordination services.   Consent to Services:  {CCMCONSENTOPTIONS:25074}  Patient Care Team: Biagio Borg, MD as PCP - General (Internal Medicine) Constance Haw, MD as PCP - Electrophysiology (Cardiology) Khylon Davies, Darnelle Maffucci, [facility] as Pharmacist (Pharmacist)  Recent office visits: ***  Recent consult visits: [facility] visits: {Hospital DC Yes/No:25215}  Objective:  Lab Results  Component Value Date   CREATININE 1.50 (H) 06/30/2021   BUN 18 06/30/2021   GFR 30.98 (L) 07/29/2020   GFRNONAA 35 (L) 06/30/2021   GFRAA 37 (L) 05/06/2020   NA 139 06/30/2021   K 3.8 06/30/2021   CALCIUM 9.3 06/30/2021   CO2 24 06/30/2021   GLUCOSE 258 (H) 06/30/2021    Lab Results  Component Value Date/Time   HGBA1C 9.2 (H) 01/21/2021 11:41 PM   HGBA1C 10.4 (A) 08/03/2020 08:02 AM   HGBA1C 9.2 (A) 06/21/2020 10:38 AM   HGBA1C 8.1 (H) 03/10/2020 07:40 AM   GFR 30.98 (L) 07/29/2020 10:41 AM   GFR 50.87 (L) 09/17/2018 10:54 AM   MICROALBUR 2.6 (H) 11/21/2017 01:09 PM   MICROALBUR 2.7 (H) 10/10/2016 12:40 PM    Last diabetic Eye exam:  Lab Results  Component Value Date/Time   HMDIABEYEEXA Retinopathy (A) 08/28/2018 02:54 PM    Last diabetic Foot exam:  No results found for: HMDIABFOOTEX   Lab Results  Component Value Date   CHOL 222 (H) 07/29/2020   HDL 48.20 07/29/2020   LDLCALC 145 (H) 07/29/2020   LDLDIRECT 152.9 12/18/2011   TRIG  145.0 07/29/2020   CHOLHDL 5 07/29/2020    Hepatic Function Latest Ref Rng & Units 06/30/2021 06/29/2021 01/21/2021  Total Protein 6.5 - 8.1 g/dL 6.6 6.1(L) 6.9  Albumin 3.5 - 5.0 g/dL 3.4(L) 3.2(L) 3.7  AST 15 - 41 U/L _0 ALT 0 - 44 U/L _1 Alk Phosphatase 38 - 126 U/L 83 79 77  Total Bilirubin 0.3 - 1.2 mg/dL 0.5 0.4 0.6  Bilirubin, Direct 0.0 - 0.3 mg/dL - - -    Lab Results  Component Value Date/Time   TSH 2.08 07/29/2020 10:41 AM   TSH 2.14 09/17/2018 10:54 AM    CBC Latest Ref Rng & Units 06/30/2021 06/29/2021 04/22/2021  WBC 4.0 - 10.5 K/uL 5.4 5.1 5.2  Hemoglobin 12.0 - 15.0 g/dL 11.4(L) 10.7(L) 11.4(L)  Hematocrit 36.0 - 46.0 % 36.7 33.5(L) 36.5  Platelets 150 - 400 K/uL 237 199 298    No results found for: VD25OH  Clinical ASCVD: {YES/NO:21197} The ASCVD Risk score (Arnett DK, et al., 2019) failed to calculate for the following reasons:   The 2019 ASCVD risk score is only valid for ages 33 to 8    Depression screen PHQ 2/9 03/18/2020 03/18/2020 09/17/2018  Decreased Interest 0 0 0  Down, Depressed, Hopeless 0 0 0  PHQ - 2 Score 0 0 0  Some recent data might be hidden     ***Other: (CHADS2VASc if Afib, MMRC or CAT for COPD, ACT, DEXA)  Social History   Tobacco Use  Smoking Status Former   Types: Cigarettes   Quit date: 08/21/1980   Years since quitting: 40.9  Smokeless Tobacco Former   BP Readings from Last 3 Encounters:  07/06/21 139/62  06/29/21 (!) 118/54  04/22/21 133/66   Pulse Readings from Last 3 Encounters:  07/06/21 70  06/29/21 71  04/22/21 82   Wt Readings from Last 3 Encounters:  04/22/21 185 lb (83.9 kg)  08/03/20 176 lb 9.6 oz (80.1 kg)  07/29/20 174 lb (78.9 kg)   BMI Readings from Last 3 Encounters:  04/22/21 32.77 kg/m  08/03/20 31.28 kg/m  07/29/20 30.82 kg/m    Assessment/Interventions: Review of patient past medical history, allergies, medications, health status, including review of consultants reports,  laboratory and other test data, was performed as part of comprehensive evaluation and provision of chronic care management services.   SDOH:  (Social Determinants of Health) assessments and interventions performed: {yes/no:20286}  SDOH Screenings   Alcohol Screen: Not on file  Depression (PHQ2-9): Not on file  Financial Resource Strain: Not on file  Food Insecurity: Not on file  Housing: Not on file  Physical Activity: Not on file  Social Connections: Not on file  Stress: Not on file  Tobacco Use: Medium Risk   Smoking Tobacco Use: Former   Smokeless Tobacco Use: Former   Passive Exposure: Not on Pensions consultant Needs: Not on file    [place]  Allergies  Allergen Reactions   Aspirin Other (See Comments)    BLEEDING BEHIND EYES   Penicillins Other (See Comments)    Unknown reaction DID THE REACTION INVOLVE: Swelling of the face/tongue/throat, SOB, or low BP? Unknown Sudden or severe rash/hives, skin peeling, or the inside of the mouth or nose? Unknown Did it require medical treatment? Unknown When did it last happen? Unknown If all above answers are "NO", may proceed with cephalosporin use.    Medications Reviewed Today     Reviewed by Dessie Coma, CPhT (Pharmacy Technician) on 06/30/21 at [place] List Status: Complete   Medication Order Taking? Sig Documenting Provider Last Dose Status Informant  acetaminophen (TYLENOL) 500 MG tablet [id number] Yes Take 500-1,000 mg by mouth every 6 (six) hours as needed for mild pain or headache. [provider] unk Active Family Member  allopurinol (ZYLOPRIM) 300 MG tablet [id number] Yes Take 1 tablet (300 mg total) by mouth 2 (two) times daily. Biagio Borg, MD unk Active Family Member  diazepam (VALIUM) 5 MG tablet [id number] No Take 1 tablet (5 mg total) by mouth every 6 (six) hours as needed for anxiety.  Patient not taking: Reported on 06/30/2021   Biagio Borg, MD Not Taking Active Family Member  glucose  blood [facility] VERIO) test strip [id number]  1 each by Other route 2 (two) times daily. And lancets 2/day Renato Shin, MD  Active Family Member  hydrALAZINE (APRESOLINE) 25 MG tablet [id number] No Take 1 tablet (25 mg total) by mouth every 8 (eight) hours.  Patient not taking: Reported on 06/30/2021   Biagio Borg, MD Not Taking Active Family Member  hydrochlorothiazide (MICROZIDE) 12.5 MG capsule [id number] No Take 1 capsule (12.5 mg total) by mouth daily.  Patient not taking: Reported on 06/30/2021   Biagio Borg, MD Not Taking Active Family Member  Insulin NPH, Human,, Marolyn Hammock, Camille Bal [facility]) 100 UNIT/ML Mayer Masker [id number] No Inject 50 Units into the skin every morning.  Patient not taking: Reported on 06/30/2021  Renato Shin, MD Not Taking Active Family Member           Med Note Duffy Bruce, Para [place] Jun 30, 2021  5:41 PM)    Lancets (ONETOUCH DELICA PLUS CVELFY10F) [place] [id number]  Test bid [provider]  Active Family Member  rosuvastatin (CRESTOR) 40 MG tablet [id number] No Take 1 tablet (40 mg total) by mouth daily.  Patient not taking: Reported on 06/30/2021   Biagio Borg, MD Not Taking Active Family Member            Patient Active Problem List   Diagnosis Date Noted   Dementia [facility]) 07/04/2021   Non compliance with medical treatment 07/31/2020   Psychosis in elderly [facility]) 07/29/2020   Diabetic leg ulcer ([place]) 07/29/2020   Benign neoplasm of ascending colon    Benign neoplasm of transverse colon    Benign neoplasm of descending colon    Benign neoplasm of sigmoid colon    History of colon polyps 05/04/2020   Complete AV block ([place]) 03/09/2020   Mild aortic stenosis 03/14/2017   Peripheral edema 03/14/2017   Arm heaviness 03/14/2017   CKD (chronic kidney disease), stage III ([place]) 05/20/2016   Diabetes ([place]) 01/03/2016   Dysuria 12/09/2014   Peripheral neuropathy 12/05/2013   Irregular heart beat 12/05/2013   PVC (premature ventricular  contraction) 12/05/2013   Sciatica 06/17/2013   Pain due to onychomycosis of toenail 05/21/2013   Mycotic toenails    Vagina bleeding 08/06/2012   Carotid stenosis 06/18/2012   Constipation 04/30/2012   Childhood asthma 12/18/2011   Glaucoma 12/18/2011   Hyperlipidemia 12/18/2011   Colon polyps 12/18/2011   Preventative health care 12/17/2011   Hypertension 12/17/2011   Hard of hearing    Dysplasia of cervix, low grade (CIN 1)    HPV (human papilloma virus) infection    Brain mass 09/18/2011    Immunization History  Administered Date(s) Administered   PFIZER Comirnaty([place])Covid-19 Tri-Sucrose Vaccine 07/04/2021   Pneumococcal Conjugate-13 06/17/2013   Pneumococcal Polysaccharide-23 12/18/2011   Tdap 12/18/2011    Conditions to be addressed/monitored:  {USCCMDZASSESSMENTOPTIONS:23563}  There are no care plans that you recently modified to display for this patient.     Medication Assistance: {MEDASSISTANCEINFO:25044}  Compliance/Adherence/Medication fill history: Care Gaps: ***  Patient's preferred pharmacy is:  [place] - [address] [place] [address] Phone: [phone] Fax: [phone]   Uses pill box? {Yes or If no, why not?:20788} Pt endorses ***% compliance  Care Plan and Follow Up Patient Decision:  {FOLLOWUP:24991}  Plan: {CM FOLLOW UP WPYK:99833}  ***  Current Barriers:  {pharmacybarriers:24917}  Pharmacist Clinical Goal(s):  Patient will {PHARMACYGOALCHOICES:24921} through collaboration with PharmD and provider.   Interventions: 1:1 collaboration with Biagio Borg, MD regarding development and update of comprehensive plan of care as evidenced by provider attestation and co-signature Inter-disciplinary care team collaboration (see longitudinal plan of care) Comprehensive medication review performed; medication list updated in electronic medical  record  Hypertension (BP goal <140/90) -{US controlled/uncontrolled:25276} -Current treatment: Hydrochlorothiazide 12.71m - 1 capsule daily Hydralazine 260m- 1 tablet every 8 hours  -Medications previously tried: valsasrtan  -Current home readings: *** BP Readings from Last 3 Encounters:  07/06/21 139/62  06/29/21 (!) 118/54  04/22/21 133/66    -Current dietary habits: *** -Current exercise habits: *** -{ACTIONS;DENIES/REPORTS:21021675::"Denies"} hypotensive/hypertensive symptoms -Educated on {CCM BP Counseling:25124} -Counseled to monitor BP at home ***, document, and provide log  at future appointments -{CCMPHARMDINTERVENTION:25122}  Hyperlipidemia: (LDL goal < 100) -{US controlled/uncontrolled:25276} Lab Results  Component Value Date   LDLCALC 145 (H) 07/29/2020  -Current treatment: Rosuvastatin 34m - 1 tablet daily  -Medications previously tried: atorvastatin,   -Current dietary patterns: *** -Current exercise habits: *** -Educated on {CCM HLD Counseling:25126} -{CCMPHARMDINTERVENTION:25122}  Diabetes (A1c goal <8%) -{US controlled/uncontrolled:25276} Lab Results  Component Value Date   HGBA1C 9.2 (H) 01/21/2021  -Current medications: Insulin NPH - 50 units in the morning  -Medications previously tried: novolong, humunlin n, levemir, lantus, novolin 70/30  -Current home glucose readings fasting glucose: *** post prandial glucose: *** -{ACTIONS;DENIES/REPORTS:21021675::"Denies"} hypoglycemic/hyperglycemic symptoms -Current meal patterns:  breakfast: ***  lunch: ***  dinner: *** snacks: *** drinks: *** -Current exercise: *** -Educated on {CCM DM COUNSELING:25123} -Counseled to check feet daily and get yearly eye exams -{CCMPHARMDINTERVENTION:25122}  Gout (Goal: Gout prevention) -{US controlled/uncontrolled:25276} -Current treatment  Allopurinol 3069m- 1 tablet twice daily  -Medications previously tried: ***  -{CCMPHARMDINTERVENTION:25122}  Chronic  Kidney Disease (Goal: Prevention of disease progression) -{US controlled/uncontrolled:25276} -Last eGFR - 3691min  -Last Scr - 1.74m41m -Current treatment  *** -Medications previously tried: ***  -{CCMPHARMDINTERVENTION:25122}   Patient Goals/Self-Care Activities Patient will:  - {pharmacypatientgoals:24919}  Follow Up Plan: {CM FOLLOW UP PLANNIOE:70350}rrent Chart Prep = 16 minutes    ***

## 2021-07-06 NOTE — ED Notes (Signed)
Found pt up out of bed wondering around room. Placed pt back in bed and educated her on staying in bed to prevent her from falling.

## 2021-07-06 NOTE — ED Notes (Signed)
Up ambulating in hall with tech at this time

## 2021-07-06 NOTE — Progress Notes (Signed)
CSW received call from [facility] that paperwork is completed and Pt can be d/c'ed to [facility] first thing in the morning tomorrow, 11/17

## 2021-07-06 NOTE — ED Notes (Signed)
Found pt out of bed wondering around room stating that God told her to be ready and she was going to be ready. Tried to get pt back in bed and pt sat on bed and continued to state that she was going to be ready for God. Charge contacted and spoke with provider received orders. Came back to room to find pt out of bed standing up at sink.

## 2021-07-06 NOTE — ED Notes (Signed)
Received verbal report from [place] at this time

## 2021-07-06 NOTE — ED Notes (Signed)
Pt refused medication stated that she took it earlier and she was not going to take it again

## 2021-07-07 DIAGNOSIS — R0902 Hypoxemia: Secondary | ICD-10-CM | POA: Diagnosis not present

## 2021-07-07 DIAGNOSIS — Z743 Need for continuous supervision: Secondary | ICD-10-CM | POA: Diagnosis not present

## 2021-07-07 DIAGNOSIS — I129 Hypertensive chronic kidney disease with stage 1 through stage 4 chronic kidney disease, or unspecified chronic kidney disease: Secondary | ICD-10-CM | POA: Diagnosis not present

## 2021-07-07 DIAGNOSIS — R279 Unspecified lack of coordination: Secondary | ICD-10-CM | POA: Diagnosis not present

## 2021-07-07 DIAGNOSIS — R404 Transient alteration of awareness: Secondary | ICD-10-CM | POA: Diagnosis not present

## 2021-07-07 DIAGNOSIS — R410 Disorientation, unspecified: Secondary | ICD-10-CM | POA: Diagnosis not present

## 2021-07-07 MED ORDER — LORAZEPAM 2 MG/ML IJ SOLN
INTRAMUSCULAR | Status: AC
  Administered 2021-07-07: 02:00:00 2 mg via INTRAMUSCULAR
  Filled 2021-07-07: qty 1

## 2021-07-07 MED ORDER — ALLOPURINOL 300 MG PO TABS: 300.0000 mg | ORAL_TABLET | Freq: Two times a day (BID) | ORAL | 0 refills | Status: DC

## 2021-07-07 MED ORDER — LORAZEPAM 2 MG/ML IJ SOLN: 2.0000 mg | Freq: Once | INTRAMUSCULAR | Status: DC

## 2021-07-07 MED ORDER — ROSUVASTATIN CALCIUM 40 MG PO TABS: 40.0000 mg | ORAL_TABLET | Freq: Every day | ORAL | 0 refills | Status: DC

## 2021-07-07 MED ORDER — QUETIAPINE FUMARATE 25 MG PO TABS: 25.0000 mg | ORAL_TABLET | Freq: Every day | ORAL | 0 refills | Status: DC

## 2021-07-07 MED ORDER — ZIPRASIDONE MESYLATE 20 MG IM SOLR
10.0000 mg | Freq: Once | INTRAMUSCULAR | Status: AC
  Administered 2021-07-07: 04:00:00 10 mg via INTRAMUSCULAR
  Filled 2021-07-07: qty 20

## 2021-07-07 MED ORDER — HYDRALAZINE HCL 25 MG PO TABS: 25.0000 mg | ORAL_TABLET | Freq: Three times a day (TID) | ORAL | 0 refills | Status: DC

## 2021-07-07 MED ORDER — STERILE WATER FOR INJECTION IJ SOLN
INTRAMUSCULAR | Status: AC
  Administered 2021-07-07: 04:00:00 1.2 mL
  Filled 2021-07-07: qty 10

## 2021-07-07 MED ORDER — DIAZEPAM 5 MG PO TABS: 5.0000 mg | ORAL_TABLET | Freq: Four times a day (QID) | ORAL | 0 refills | Status: DC | PRN

## 2021-07-07 MED ORDER — HYDROCHLOROTHIAZIDE 12.5 MG PO TABS: 25.0000 mg | ORAL_TABLET | Freq: Every day | ORAL | 0 refills | Status: DC

## 2021-07-07 MED ORDER — METFORMIN HCL 500 MG PO TABS: 500.0000 mg | ORAL_TABLET | Freq: Two times a day (BID) | ORAL | 0 refills | Status: DC

## 2021-07-07 NOTE — ED Notes (Signed)
PTAR called at 9:21. ETA about an hour.

## 2021-07-07 NOTE — ED Notes (Signed)
Pt up in hallway yelling at the sitter. Unable to redirect back to room. Pt finally stated she needed to go to the bathroom. While walking to bathroom she continued to yell at the sitter. Went and spoke with provider and received verbal orders for medication at this time

## 2021-07-07 NOTE — ED Notes (Signed)
Attempted to call report to [facility] went to VM that was not set up.  This nurse called back, staff not answering called back to staff informing this nurse "you can call back if you want too.".  PTAR request for pt to be transported to [facility].

## 2021-07-07 NOTE — ED Notes (Signed)
Pt continues to refuse to get back into bed and trying to get out of the room. Arguing with this Retail banker. Attempting to get pt back into bed and she refuses. Spoke with provider and advised could use restraints if need to. When came back to room pt is sitting on edge of bed but refuses to lay down

## 2021-07-07 NOTE — ED Notes (Signed)
Pt placed back in bed at this time by staff

## 2021-07-07 NOTE — ED Notes (Signed)
Pt ambulated back to restroom at this time

## 2021-07-07 NOTE — ED Notes (Signed)
Attempted to call [facility] again.  No answer VM not sent up.

## 2021-07-07 NOTE — ED Notes (Signed)
Pt has left the ER for [facility], attempted to call report again to Marry Guan no answer.  Pt enroute to [facility] aware.

## 2021-07-07 NOTE — ED Notes (Signed)
Pt awake trying to get out of bed and stand up. Unsteady on her feet at this time. Pt placed back in bed. Pt refused medication

## 2021-07-07 NOTE — ED Notes (Signed)
Pt out of bed trying to leave the room and is arguing with this RN and sitter. Refuses to get back in bed and is trying to get out of the room. Spoke with provider and received orders

## 2021-07-18 DIAGNOSIS — N189 Chronic kidney disease, unspecified: Secondary | ICD-10-CM | POA: Diagnosis not present

## 2021-07-18 DIAGNOSIS — F01A11 Vascular dementia, mild, with agitation: Secondary | ICD-10-CM | POA: Diagnosis not present

## 2021-07-18 DIAGNOSIS — H409 Unspecified glaucoma: Secondary | ICD-10-CM | POA: Diagnosis not present

## 2021-07-18 DIAGNOSIS — K922 Gastrointestinal hemorrhage, unspecified: Secondary | ICD-10-CM | POA: Diagnosis not present

## 2021-07-18 DIAGNOSIS — E785 Hyperlipidemia, unspecified: Secondary | ICD-10-CM | POA: Diagnosis not present

## 2021-07-18 DIAGNOSIS — I10 Essential (primary) hypertension: Secondary | ICD-10-CM | POA: Diagnosis not present

## 2021-07-25 DIAGNOSIS — Z9114 Patient's other noncompliance with medication regimen: Secondary | ICD-10-CM | POA: Diagnosis not present

## 2021-07-25 DIAGNOSIS — F03B3 Unspecified dementia, moderate, with mood disturbance: Secondary | ICD-10-CM | POA: Diagnosis not present

## 2021-07-25 DIAGNOSIS — I10 Essential (primary) hypertension: Secondary | ICD-10-CM | POA: Diagnosis not present

## 2021-08-10 ENCOUNTER — Telehealth: Payer: Medicare Other

## 2021-08-10 NOTE — Progress Notes (Deleted)
Chronic Care Management Pharmacy Note  08/10/2021 Name:  Gail Lynch MRN:  [id number] DOB:  1940/09/14  Summary: ***  Recommendations/Changes made from today's visit: ***  Plan: ***  Subjective: Gail Lynch is an 80 y.o. year old female who is a primary patient of Jenny Reichmann, Hunt Oris, MD.  The CCM team was consulted for assistance with disease management and care coordination needs.    Engaged with patient by telephone for initial visit in response to provider referral for pharmacy case management and/or care coordination services.   Consent to Services:  The patient was given the following information about Chronic Care Management services today, agreed to services, and gave verbal consent: 1. CCM service includes personalized support from designated clinical staff supervised by the primary care provider, including individualized plan of care and coordination with other care providers 2. 24/7 contact phone numbers for assistance for urgent and routine care needs. 3. Service will only be billed when office clinical staff spend 20 minutes or more in a month to coordinate care. 4. Only one practitioner may furnish and bill the service in a calendar month. 5.The patient may stop CCM services at any time (effective at the end of the month) by phone call to the office staff. 6. The patient will be responsible for cost sharing (co-pay) of up to 20% of the service fee (after annual deductible is met). Patient agreed to services and consent obtained.  Patient Care Team: Biagio Borg, MD as PCP - General (Internal Medicine) Constance Haw, MD as PCP - Electrophysiology (Cardiology) Delice Bison Darnelle Maffucci, [facility] as Pharmacist (Pharmacist)  Recent office visits: 02/04/21 Video Cathlean Cower MD PCP- pt was was seen for Psychosis in elderly. No meds or lab changes. Follow up PRN.  Recent consult visits: 01/19/21 Gardiner Barefoot DPM Podiatry- pt was due to onychmycosis pain.  Pt had debridement and advised  to follow up in 3 months.  Hospital visits:  Medication Reconciliation was completed by comparing discharge summary, patients EMR and Pharmacy list, and upon discussion with patient.   Admitted to the hospital on 06/30/21 due to psychosis. Discharge date was 07/07/2021 Admitted for psychiatric evaluation  - metformin started outpatient .     2. Admitted to the hospital on 06/29/21 due to fall. Discharge date was 06/29/21 at [facility]     3. Admitted to the hospital on 04/22/21 due to Dementia. Discharge date was 04/22/21 at [facility].    4.   Admitted to the hospital on 01/21/21 due to Psychosis. Discharge date was 01/24/21 at [facility].    Objective:  Lab Results  Component Value Date   CREATININE 1.50 (H) 06/30/2021   BUN 18 06/30/2021   GFR 30.98 (L) 07/29/2020   GFRNONAA 35 (L) 06/30/2021   GFRAA 37 (L) 05/06/2020   NA 139 06/30/2021   K 3.8 06/30/2021   CALCIUM 9.3 06/30/2021   CO2 24 06/30/2021   GLUCOSE 258 (H) 06/30/2021    Lab Results  Component Value Date/Time   HGBA1C 9.2 (H) 01/21/2021 11:41 PM   HGBA1C 10.4 (A) 08/03/2020 08:02 AM   HGBA1C 9.2 (A) 06/21/2020 10:38 AM   HGBA1C 8.1 (H) 03/10/2020 07:40 AM   GFR 30.98 (L) 07/29/2020 10:41 AM   GFR 50.87 (L) 09/17/2018 10:54 AM   MICROALBUR 2.6 (H) 11/21/2017 01:09 PM   MICROALBUR 2.7 (H) 10/10/2016 12:40 PM    Last diabetic Eye exam:  Lab Results  Component Value Date/Time   HMDIABEYEEXA Retinopathy (  A) 08/28/2018 02:54 PM    Last diabetic Foot exam:  No results found for: HMDIABFOOTEX   Lab Results  Component Value Date   CHOL 222 (H) 07/29/2020   HDL 48.20 07/29/2020   LDLCALC 145 (H) 07/29/2020   LDLDIRECT 152.9 12/18/2011   TRIG 145.0 07/29/2020   CHOLHDL 5 07/29/2020    Hepatic Function Latest Ref Rng & Units 06/30/2021 06/29/2021 01/21/2021  Total Protein 6.5 - 8.1 g/dL 6.6 6.1(L) 6.9  Albumin 3.5 - 5.0 g/dL 3.4(L) 3.2(L) 3.7  AST 15 - 41 U/L 24 16 17   ALT 0 - 44  U/L 14 11 10   Alk Phosphatase 38 - 126 U/L 83 79 77  Total Bilirubin 0.3 - 1.2 mg/dL 0.5 0.4 0.6  Bilirubin, Direct 0.0 - 0.3 mg/dL - - -    Lab Results  Component Value Date/Time   TSH 2.08 07/29/2020 10:41 AM   TSH 2.14 09/17/2018 10:54 AM    CBC Latest Ref Rng & Units 06/30/2021 06/29/2021 04/22/2021  WBC 4.0 - 10.5 K/uL 5.4 5.1 5.2  Hemoglobin 12.0 - 15.0 g/dL 11.4(L) 10.7(L) 11.4(L)  Hematocrit 36.0 - 46.0 % 36.7 33.5(L) 36.5  Platelets 150 - 400 K/uL 237 199 298    No results found for: VD25OH  Clinical ASCVD: No  The ASCVD Risk score (Arnett DK, et al., 2019) failed to calculate for the following reasons:   The 2019 ASCVD risk score is only valid for ages 55 to 63    Depression screen PHQ 2/9 03/18/2020 03/18/2020 09/17/2018  Decreased Interest 0 0 0  Down, Depressed, Hopeless 0 0 0  PHQ - 2 Score 0 0 0  Some recent data might be hidden     Social History   Tobacco Use  Smoking Status Former   Types: Cigarettes   Quit date: 08/21/1980   Years since quitting: 40.9  Smokeless Tobacco Former   BP Readings from Last 3 Encounters:  07/07/21 (!) 153/74  06/29/21 (!) 118/54  04/22/21 133/66   Pulse Readings from Last 3 Encounters:  07/07/21 92  06/29/21 71  04/22/21 82   Wt Readings from Last 3 Encounters:  04/22/21 185 lb (83.9 kg)  08/03/20 176 lb 9.6 oz (80.1 kg)  07/29/20 174 lb (78.9 kg)   BMI Readings from Last 3 Encounters:  04/22/21 32.77 kg/m  08/03/20 31.28 kg/m  07/29/20 30.82 kg/m    Assessment/Interventions: Review of patient past medical history, allergies, medications, health status, including review of consultants reports, laboratory and other test data, was performed as part of comprehensive evaluation and provision of chronic care management services.   SDOH:  (Social Determinants of Health) assessments and interventions performed: {yes/no:20286}  SDOH Screenings   Alcohol Screen: Not on file  Depression (PHQ2-9): Not on file   Financial Resource Strain: Not on file  Food Insecurity: Not on file  Housing: Not on file  Physical Activity: Not on file  Social Connections: Not on file  Stress: Not on file  Tobacco Use: Medium Risk   Smoking Tobacco Use: Former   Smokeless Tobacco Use: Former   Passive Exposure: Not on Pensions consultant Needs: Not on file    [place]  Allergies  Allergen Reactions   Aspirin Other (See Comments)    BLEEDING BEHIND EYES   Penicillins Other (See Comments)    Unknown reaction DID THE REACTION INVOLVE: Swelling of the face/tongue/throat, SOB, or low BP? Unknown Sudden or severe rash/hives, skin peeling, or the inside of the mouth  or nose? Unknown Did it require medical treatment? Unknown When did it last happen? Unknown If all above answers are "NO", may proceed with cephalosporin use.    Medications Reviewed Today     Reviewed by Dessie Coma, CPhT (Pharmacy Technician) on 06/30/21 at [place] List Status: Complete   Medication Order Taking? Sig Documenting Provider Last Dose Status Informant  acetaminophen (TYLENOL) 500 MG tablet [id number] Yes Take 500-1,000 mg by mouth every 6 (six) hours as needed for mild pain or headache. [provider] unk Active Family Member  allopurinol (ZYLOPRIM) 300 MG tablet [id number] Yes Take 1 tablet (300 mg total) by mouth 2 (two) times daily. Biagio Borg, MD unk Active Family Member  diazepam (VALIUM) 5 MG tablet [id number] No Take 1 tablet (5 mg total) by mouth every 6 (six) hours as needed for anxiety.  Patient not taking: Reported on 06/30/2021   Biagio Borg, MD Not Taking Active Family Member  glucose blood [facility] VERIO) test strip [id number]  1 each by Other route 2 (two) times daily. And lancets 2/day Renato Shin, MD  Active Family Member  hydrALAZINE (APRESOLINE) 25 MG tablet [id number] No Take 1 tablet (25 mg total) by mouth every 8 (eight) hours.  Patient not taking: Reported on 06/30/2021   Biagio Borg,  MD Not Taking Active Family Member  hydrochlorothiazide (MICROZIDE) 12.5 MG capsule [id number] No Take 1 capsule (12.5 mg total) by mouth daily.  Patient not taking: Reported on 06/30/2021   Biagio Borg, MD Not Taking Active Family Member  Insulin NPH, Human,, Marolyn Hammock, Camille Bal [facility]) 100 UNIT/ML Mayer Masker [id number] No Inject 50 Units into the skin every morning.  Patient not taking: Reported on 06/30/2021   Renato Shin, MD Not Taking Active Family Member           Med Note Duffy Bruce, Para [place]ans Jun 30, 2021  5:41 PM)    Lancets (ONETOUCH DELICA PLUS [id number]) Tangent [id number]  Test bid [provider]  Active Family Member  rosuvastatin (CRESTOR) 40 MG tablet [id number] No Take 1 tablet (40 mg total) by mouth daily.  Patient not taking: Reported on 06/30/2021   Biagio Borg, MD Not Taking Active Family Member            Patient Active Problem List   Diagnosis Date Noted   Dementia [facility]) 07/04/2021   Non compliance with medical treatment 07/31/2020   Psychosis in elderly [facility]) 07/29/2020   Diabetic leg ulcer ([place]) 07/29/2020   Benign neoplasm of ascending colon    Benign neoplasm of transverse colon    Benign neoplasm of descending colon    Benign neoplasm of sigmoid colon    History of colon polyps 05/04/2020   Complete AV block ([place]) 03/09/2020   Mild aortic stenosis 03/14/2017   Peripheral edema 03/14/2017   Arm heaviness 03/14/2017   CKD (chronic kidney disease), stage III ([place]) 05/20/2016   Diabetes ([place]) 01/03/2016   Dysuria 12/09/2014   Peripheral neuropathy 12/05/2013   Irregular heart beat 12/05/2013   PVC (premature ventricular contraction) 12/05/2013   Sciatica 06/17/2013   Pain due to onychomycosis of toenail 05/21/2013   Mycotic toenails    Vagina bleeding 08/06/2012   Carotid stenosis 06/18/2012   Constipation 04/30/2012   Childhood asthma 12/18/2011   Glaucoma 12/18/2011   Hyperlipidemia 12/18/2011   Colon polyps 12/18/2011   Preventative  health care 12/17/2011   Hypertension 12/17/2011   Hard of hearing  Dysplasia of cervix, low grade (CIN 1)    HPV (human papilloma virus) infection    Brain mass 09/18/2011    Immunization History  Administered Date(s) Administered   PFIZER Comirnaty([place])Covid-19 Tri-Sucrose Vaccine 07/04/2021   Pneumococcal Conjugate-13 06/17/2013   Pneumococcal Polysaccharide-23 12/18/2011   Tdap 12/18/2011    Conditions to be addressed/monitored:  {USCCMDZASSESSMENTOPTIONS:23563}  There are no care plans that you recently modified to display for this patient.     Medication Assistance: {MEDASSISTANCEINFO:25044}  Compliance/Adherence/Medication fill history: Care Gaps: ***  Patient's preferred pharmacy is:  [facility] ([place]), [place] - [address] [place] ([place]) [place] [address] Phone: [phone] Fax: [phone]   Uses pill box? {Yes or If no, why not?:20788} Pt endorses ***% compliance  Care Plan and Follow Up Patient Decision:  {FOLLOWUP:24991}  Plan: {CM FOLLOW UP AXKP:53748}  ***  Current Barriers:  {pharmacybarriers:24917}  Pharmacist Clinical Goal(s):  Patient will {PHARMACYGOALCHOICES:24921} through collaboration with PharmD and provider.   Interventions: 1:1 collaboration with Biagio Borg, MD regarding development and update of comprehensive plan of care as evidenced by provider attestation and co-signature Inter-disciplinary care team collaboration (see longitudinal plan of care) Comprehensive medication review performed; medication list updated in electronic medical record  Hypertension (BP goal <140/90) -{US controlled/uncontrolled:25276} -Current treatment: Hydrochlorothiazide 12.41m - 1 capsule daily Hydralazine 252m- 1 tablet every 8 hours  -Medications previously tried: valsasrtan  -Current home readings: *** -Current dietary habits: *** -Current exercise habits:  *** -{ACTIONS;DENIES/REPORTS:21021675::"Denies"} hypotensive/hypertensive symptoms -Educated on {CCM BP Counseling:25124} -Counseled to monitor BP at home ***, document, and provide log at future appointments -{CCMPHARMDINTERVENTION:25122}  Hyperlipidemia: (LDL goal < 100) -{US controlled/uncontrolled:25276} Lab Results  Component Value Date   LDLCALC 145 (H) 07/29/2020  -Current treatment: Rosuvastatin 4052m 1 tablet daily  -Medications previously tried: atorvastatin,   -Current dietary patterns: *** -Current exercise habits: *** -Educated on {CCM HLD Counseling:25126} -{CCMPHARMDINTERVENTION:25122}  Diabetes (A1c goal <8%) -Not ideally controlled Lab Results  Component Value Date   HGBA1C 9.2 (H) 01/21/2021  -Current medications: Insulin NPH - 50 units in the morning  -Medications previously tried: novolong, humunlin n, levemir, lantus, novolin 70/30  -Current home glucose readings fasting glucose: *** post prandial glucose: *** -{ACTIONS;DENIES/REPORTS:21021675::"Denies"} hypoglycemic/hyperglycemic symptoms -Current meal patterns:  breakfast: ***  lunch: ***  dinner: *** snacks: *** drinks: *** -Current exercise: *** -Educated on {CCM DM COUNSELING:25123} -Counseled to check feet daily and get yearly eye exams -{CCMPHARMDINTERVENTION:25122}  Gout (Goal: Gout prevention) -{US controlled/uncontrolled:25276} -Current treatment  Allopurinol 300m31m1 tablet twice daily  -Medications previously tried: ***  -{CCMPHARMDINTERVENTION:25122}  Chronic Kidney Disease (Goal: Prevention of disease progression) -{US controlled/uncontrolled:25276} -Last eGFR - 36mL72m  -Last Scr - 1.46mg/75mCurrent treatment  *** -Medications previously tried: ***  -{CCMPHARMDINTERVENTION:25122}   Patient Goals/Self-Care Activities Patient will:  - {pharmacypatientgoals:24919}  Follow Up Plan: {CM FOLLOW UP PLAN:2OLMB:86754}ent Chart Prep = 27 minutes

## 2021-09-05 DIAGNOSIS — E785 Hyperlipidemia, unspecified: Secondary | ICD-10-CM | POA: Diagnosis not present

## 2021-09-05 DIAGNOSIS — I10 Essential (primary) hypertension: Secondary | ICD-10-CM | POA: Diagnosis not present

## 2021-09-05 DIAGNOSIS — E1165 Type 2 diabetes mellitus with hyperglycemia: Secondary | ICD-10-CM | POA: Diagnosis not present

## 2021-09-05 DIAGNOSIS — H409 Unspecified glaucoma: Secondary | ICD-10-CM | POA: Diagnosis not present

## 2021-09-05 DIAGNOSIS — F01B2 Vascular dementia, moderate, with psychotic disturbance: Secondary | ICD-10-CM | POA: Diagnosis not present

## 2021-09-08 ENCOUNTER — Ambulatory Visit (INDEPENDENT_AMBULATORY_CARE_PROVIDER_SITE_OTHER): Payer: Medicare Other

## 2021-09-08 DIAGNOSIS — I442 Atrioventricular block, complete: Secondary | ICD-10-CM | POA: Diagnosis not present

## 2021-09-12 DIAGNOSIS — F0392 Unspecified dementia, unspecified severity, with psychotic disturbance: Secondary | ICD-10-CM | POA: Diagnosis not present

## 2021-09-12 DIAGNOSIS — Z9114 Patient's other noncompliance with medication regimen: Secondary | ICD-10-CM | POA: Diagnosis not present

## 2021-09-12 DIAGNOSIS — E1165 Type 2 diabetes mellitus with hyperglycemia: Secondary | ICD-10-CM | POA: Diagnosis not present

## 2021-09-13 ENCOUNTER — Ambulatory Visit (HOSPITAL_COMMUNITY)
Admission: EM | Admit: 2021-09-13 | Discharge: 2021-09-13 | Disposition: A | Discharge status: Home / Self Care | Payer: Medicare Other | Attending: Registered Nurse | Admitting: Registered Nurse

## 2021-09-13 ENCOUNTER — Encounter (HOSPITAL_COMMUNITY): Payer: Self-pay | Admitting: Registered Nurse

## 2021-09-13 DIAGNOSIS — K5900 Constipation, unspecified: Secondary | ICD-10-CM | POA: Diagnosis not present

## 2021-09-13 DIAGNOSIS — F02C11 Dementia in other diseases classified elsewhere, severe, with agitation: Secondary | ICD-10-CM | POA: Diagnosis present

## 2021-09-13 DIAGNOSIS — Z046 Encounter for general psychiatric examination, requested by authority: Secondary | ICD-10-CM

## 2021-09-13 DIAGNOSIS — M791 Myalgia, unspecified site: Secondary | ICD-10-CM | POA: Insufficient documentation

## 2021-09-13 DIAGNOSIS — Z794 Long term (current) use of insulin: Secondary | ICD-10-CM | POA: Diagnosis not present

## 2021-09-13 DIAGNOSIS — E11649 Type 2 diabetes mellitus with hypoglycemia without coma: Secondary | ICD-10-CM | POA: Diagnosis not present

## 2021-09-13 DIAGNOSIS — F03C11 Unspecified dementia, severe, with agitation: Secondary | ICD-10-CM | POA: Diagnosis not present

## 2021-09-13 DIAGNOSIS — F0390 Unspecified dementia without behavioral disturbance: Secondary | ICD-10-CM | POA: Diagnosis present

## 2021-09-13 DIAGNOSIS — T383X6A Underdosing of insulin and oral hypoglycemic [antidiabetic] drugs, initial encounter: Secondary | ICD-10-CM | POA: Diagnosis not present

## 2021-09-13 DIAGNOSIS — I67850 Cerebral autosomal dominant arteriopathy with subcortical infarcts and leukoencephalopathy: Secondary | ICD-10-CM | POA: Diagnosis present

## 2021-09-13 DIAGNOSIS — Z593 Problems related to living in residential institution: Secondary | ICD-10-CM

## 2021-09-13 DIAGNOSIS — E1165 Type 2 diabetes mellitus with hyperglycemia: Secondary | ICD-10-CM | POA: Diagnosis not present

## 2021-09-13 DIAGNOSIS — Z91128 Patient's intentional underdosing of medication regimen for other reason: Secondary | ICD-10-CM | POA: Insufficient documentation

## 2021-09-13 LAB — GLUCOSE, CAPILLARY
Glucose-Capillary: 330 mg/dL — ABNORMAL HIGH (ref 70–99)
Glucose-Capillary: 296 mg/dL — ABNORMAL HIGH (ref 70–99)

## 2021-09-13 MED ORDER — INSULIN ASPART 100 UNIT/ML IJ SOLN
0.0000 [IU] | Freq: Three times a day (TID) | INTRAMUSCULAR | Status: DC
  Administered 2021-09-13: 18:00:00 8 [IU] via SUBCUTANEOUS

## 2021-09-13 NOTE — Progress Notes (Signed)
Gail Lynch was brought her from her nursing home under IVC related to refusing care. She was processed and cooperative with the admission process. Her blood glucose was 330 at 1450 hrs. She was relocated to room 135, calm and cooperative. She answered simple questions correctly, such as her name and birth date.

## 2021-09-13 NOTE — Progress Notes (Signed)
[employer] arrived to take her back home, the nursing home.

## 2021-09-13 NOTE — BH Assessment (Addendum)
Pt presents under IVC initiated by resident care manager. 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 She is currently refusing insulin. Refusing to eat, sleep or tend to her personal hygiene. She talks to the air and the walls. She stated that voices are telling her to kill herself."  Pt denies SI, HI, AVH and reports "they are lying on me". Pt reports she is not taking her insulin or eating because she feels like they are tampering with her insulin and putting medications in her food. Pt reports the only person she talks to is God and she will never kill herself or anyone else. Pt is pleasant.  Pt is urgent.

## 2021-09-13 NOTE — Progress Notes (Signed)
Received Gail Lynch in the [facility] four hours and forty eight minutes ago. She has been calm and cooperative throughout her stay her at the [facility]. She was relocated to an assessment room and BS checked initially was 330. The second BS check was 296 and she was given 8 units of insulin per order. She was given a meal and allowed adequate time to eat. She remained pleasant and cooperative throughout her stay. [employer] was called and contact was made to take her back to her nursing home at 1815 hours.

## 2021-09-13 NOTE — ED Provider Notes (Signed)
[facility] Medical Screening Exam  Patient Name: Gail Lynch MRN: [id number] Date of Evaluation: 09/13/21 Chief Complaint:   Diagnosis:  Final diagnoses:  Involuntary commitment  Severe dementia with agitation, unspecified dementia type    History of Present illness: Gail Lynch is a 81 y.o. female patient presented to [facility] via law enforcement under IVC petitioned by nursing home staff.  Per IVC " Respondent has been diagnosed with dementia and is a diabetic.  She is currently refusing insulin.  Refusing to eat, sleep or tend to her personal hygiene.  She talks to the air and the walls.  She stated that voices are telling her to kill herself."  Gail Lynch, 81 y.o., female patient seen face to face by this provider, consulted with Dr. Ernie Hew; and chart reviewed on 09/13/21.  On evaluation Gail Lynch reports she does not know why she was brought here.  Patient asked if she has been hearing voices or seeing things that others do not and she stated "Naw I ain't hearing no voices telling me to kill myself.  They do not even cross my mind because I know who coming to get me and I get no reason to kill myself.  Just because they do not know what I am saying do not mean I I am talking out of my head.  I am talking to [place].  I might going through he will open there but again so bed that I want to kill myself I have things to do for my Lord and Savior and it ain't killing myself."  Patient asked if she has been taking her insulin and she states "Yes at times.  I have been trying to get off of it.  My sugar been fine I do not think I need no insulin."  Patient educated that she will need to take her insulin every day because her body is not making any.  Informed that her blood sugar is okay because her insulin is helping.  Told her we will give her educational material to read over.  Understanding voiced.  Patient also asked if she has been eating and how she was  sleeping.  Patient states "I'm sleeping just fine.  They come in and tell me to cut out the light and go to bed lie, child.  I know when I get sleepy.  I just be sent to my room reading the word and when I get tired I'll go to sleep."  When discussing her eating patient states "I eat until I see where they can snuck some medicine or some pills or something in my food."  Patient reporting she feels like they are trying to poison her when he put medicine or pills in her food and "They think I do not see it but I do."  Patient denies suicidal/self-harm/homicidal ideation, psychosis, paranoia.  Patient reports has no complaints with the place she is at other than then putting pills in medicine in her food and terminal with time she needs to go to bed. During evaluation [name] is sitting up in chair in no acute distress.  She is alert/oriented x 4;.  Patient was able to give the correct information for the current year, month, city, state, country, age, date of birth, and the last 3 presidents.  She is calm and cooperative.  Her mood is mood euthymic affect and congruent with affect.  She is speaking in a clear tone at moderate volume, and normal pace; with  good eye contact.  Her thought process is coherent and relevant; There is no indication that she is currently responding to internal/external stimuli or experiencing delusional thought content.  She denies suicidal/self-harm/homicidal ideation, psychosis, and paranoia.  Patient does admit to not wanting to take her insulin but being educated that insulin is something that she will always have to take even though blood sugar is controlled.  Also informed insulin is what helped keep blood sugar controlled Patient is a elderly woman reporting that she has been in current nursing home for 3 months.  She does not like people telling her what to do or treat her like a child.  Reports that she does not be talking to herself or talking to the wall with talking to her  [employer]. IVC petition by Marry Guan who is the resident care manager at [phone] and was given another number to call [phone].  No answer so called the first number again.  Keshia who receptionist who answers the phone: attempted to get someone to the phone.  Informed that patient has been psychiatrically cleared to come back to the facility.    Marry Guan called left message that they are refusing to take patient back.     After thorough evaluation and review of information currently presented on assessment of Gail Lynch (respondent), there is insufficient findings to indicate respondent meets criteria for involuntary commitment or require psychiatric inpatient level of care. Respondent is alert/oriented x 4; calm/cooperative; and mood congruent with affect. Respondent is speaking in a clear tone at moderate volume, and normal pace, with good eye contact. Respondents' thought process is coherent and relevant; There is no indication that the respondent is currently responding to internal/external stimuli or experiencing delusional thought content.  Respondent denies suicidal/self-harm/homicidal ideation, psychosis, and paranoia. Respondent has remained calm throughout assessment and has answered questions appropriately. Currently respondent is not significantly impaired, psychotic, or manic on exam. A detailed risk assessment has been completed based on clinical exam and individual risk factors. Patient acute suicide risk is low.   Psychiatric Specialty Exam  Presentation  General Appearance:Appropriate for Environment; Casual  Eye Contact:Good  Speech:Clear and Coherent; Normal Rate  Speech Volume:Normal  Handedness:Right   Mood and Affect  Mood:Euthymic  Affect:Appropriate; Congruent   Thought Process  Thought Processes:Coherent; Goal Directed  Descriptions of Associations:Intact  Orientation:Full (Time, Place and Person)  Thought Content:Rumination   Diagnosis of Schizophrenia or Schizoaffective disorder in past: No  Duration of Psychotic Symptoms: Less than six months  Hallucinations:None  Ideas of Reference:None  Suicidal Thoughts:No  Homicidal Thoughts:No   Sensorium  Memory:Remote Good; Recent Good; Immediate Good  Judgment:Fair  Insight:Fair   Executive Functions  Concentration:Fair  Attention Span:Fair  [place]  Language:Good   Psychomotor Activity  Psychomotor Activity:Normal   Assets  Assets:Communication Skills; Desire for Improvement; Financial Resources/Insurance; Housing; Leisure Time; Physical Health; Social Support; Transportation   Sleep  Sleep:Good  Number of hours: No data recorded  Nutritional Assessment (For OBS and FBC admissions only) Has the patient had a weight loss or gain of 10 pounds or more in the last 3 months?: No Has the patient had a decrease in food intake/or appetite?: Yes Does the patient have dental problems?: No Does the patient have eating habits or behaviors that may be indicators of an eating disorder including binging or inducing vomiting?: No Has the patient recently lost weight without trying?: 0 Has the patient been eating poorly because of a decreased  appetite?: 0 Malnutrition Screening Tool Score: 0   Physical Exam: Physical Exam Vitals and nursing note reviewed. Exam conducted with a chaperone present.  Constitutional:      General: She is not in acute distress.    Appearance: Normal appearance. She is not ill-appearing.     Comments: Elevated blood sugar   Cardiovascular:     Rate and Rhythm: Normal rate.  Pulmonary:     Effort: Pulmonary effort is normal.  Musculoskeletal:        General: Normal range of motion.     Cervical back: Normal range of motion.  Skin:    General: Skin is warm and dry.  Neurological:     Mental Status: She is alert and oriented to person, place, and time.  Psychiatric:        Attention and  Perception: Attention and perception normal. She does not perceive auditory or visual hallucinations.        Mood and Affect: Mood and affect normal.        Speech: Speech normal.        Behavior: Behavior normal. Behavior is cooperative.        Thought Content: Thought content normal. Thought content is not paranoid or delusional. Thought content does not include homicidal or suicidal ideation.        Cognition and Memory: Cognition normal.        Judgment: Judgment is impulsive.   Review of Systems  Constitutional: Negative.   HENT: Negative.    Eyes: Negative.   Respiratory: Negative.    Cardiovascular: Negative.   Gastrointestinal:  Positive for constipation.  Genitourinary: Negative.   Musculoskeletal:  Positive for myalgias.  Skin: Negative.   Neurological: Negative.   Endo/Heme/Allergies: Negative.        History of diabetes   Psychiatric/Behavioral:  Negative for hallucinations, substance abuse and suicidal ideas. Depression: Stable.The patient is not nervous/anxious.   Blood pressure (!) 143/66, pulse 82, temperature 98.2 F (36.8 C), temperature source Oral, resp. rate 16, SpO2 100 %. There is no height or weight on file to calculate BMI.    Meds ordered this encounter  Medications   insulin aspart (novoLOG) injection 0-15 Units    Order Specific Question:   Correction coverage:    Answer:   Moderate (average weight, post-op)    Order Specific Question:   CBG < 70:    Answer:   Implement Hypoglycemia Standing Orders and refer to Hypoglycemia Standing Orders sidebar report    Order Specific Question:   CBG 70 - 120:    Answer:   0 units    Order Specific Question:   CBG 121 - 150:    Answer:   2 units    Order Specific Question:   CBG 151 - 200:    Answer:   3 units    Order Specific Question:   CBG 201 - 250:    Answer:   5 units    Order Specific Question:   CBG 251 - 300:    Answer:   8 units    Order Specific Question:   CBG 301 - 350:    Answer:   11 units     Order Specific Question:   CBG 351 - 400:    Answer:   15 units    Order Specific Question:   CBG > 400    Answer:   call MD and obtain STAT lab verification    Musculoskeletal: Strength & Muscle Tone: within  normal limits Gait & Station: normal Patient leans: N/A  [place] [facility] Discharge Disposition for Follow up and Recommendations: Based on my evaluation the patient does not appear to have an emergency medical condition and can be discharged with resources and follow up care in outpatient services for Medication Management, Individual Therapy, and Group Therapy    Discharge Instructions      You need to follow up with your endocrinologist to monitor your diabetes, diabetic eduction.       Kaleab Frasier, NP 09/13/2021, 4:35 PM

## 2021-09-13 NOTE — Discharge Instructions (Addendum)
You need to follow up with your endocrinologist to monitor your diabetes, diabetic eduction.

## 2021-09-20 NOTE — Progress Notes (Signed)
Remote pacemaker transmission.   

## 2021-09-26 DIAGNOSIS — F0392 Unspecified dementia, unspecified severity, with psychotic disturbance: Secondary | ICD-10-CM | POA: Diagnosis not present

## 2021-09-26 DIAGNOSIS — E1165 Type 2 diabetes mellitus with hyperglycemia: Secondary | ICD-10-CM | POA: Diagnosis not present

## 2021-09-26 DIAGNOSIS — Z9114 Patient's other noncompliance with medication regimen: Secondary | ICD-10-CM | POA: Diagnosis not present

## 2021-09-29 ENCOUNTER — Telehealth (HOSPITAL_COMMUNITY): Payer: Self-pay

## 2021-09-29 NOTE — BH Assessment (Signed)
Care Management - Follow Up [facility] Discharges   Writer made contact with the patient [facility] ([place]  [phone]).  The patient is still at the [facility] facility and she is 81 years old with a with a diagnosis of dementia.  Patient receives services follow up mental health services at the [place].

## 2021-10-11 ENCOUNTER — Ambulatory Visit (INDEPENDENT_AMBULATORY_CARE_PROVIDER_SITE_OTHER): Payer: Medicare Other

## 2021-10-11 DIAGNOSIS — I442 Atrioventricular block, complete: Secondary | ICD-10-CM | POA: Diagnosis not present

## 2021-10-12 LAB — CUP PACEART REMOTE DEVICE CHECK
Battery Remaining Longevity: 103 mo
Battery Voltage: 3.01 V
Brady Statistic AP VP Percent: 13.76 %
Brady Statistic AP VS Percent: 0 %
Brady Statistic AS VP Percent: 86.21 %
Brady Statistic AS VS Percent: 0.02 %
Brady Statistic RA Percent Paced: 13.49 %
Brady Statistic RV Percent Paced: 99.98 %
Date Time Interrogation Session: 20230221135755
Implantable Lead Implant Date: 20210720
Implantable Lead Implant Date: 20210720
Implantable Lead Location: 753859
Implantable Lead Location: 753860
Implantable Lead Model: 5076
Implantable Lead Model: 5076
Implantable Pulse Generator Implant Date: 20210720
Lead Channel Impedance Value: 247 Ohm
Lead Channel Impedance Value: 266 Ohm
Lead Channel Impedance Value: 304 Ohm
Lead Channel Impedance Value: 342 Ohm
Lead Channel Pacing Threshold Amplitude: 0.5 V
Lead Channel Pacing Threshold Amplitude: 0.625 V
Lead Channel Pacing Threshold Pulse Width: 0.4 ms
Lead Channel Pacing Threshold Pulse Width: 0.4 ms
Lead Channel Sensing Intrinsic Amplitude: 1.25 mV
Lead Channel Sensing Intrinsic Amplitude: 1.25 mV
Lead Channel Sensing Intrinsic Amplitude: 2 mV
Lead Channel Sensing Intrinsic Amplitude: 3.625 mV
Lead Channel Setting Pacing Amplitude: 1.5 V
Lead Channel Setting Pacing Amplitude: 2.5 V
Lead Channel Setting Pacing Pulse Width: 0.4 ms
Lead Channel Setting Sensing Sensitivity: 1.2 mV

## 2021-10-17 DIAGNOSIS — Z950 Presence of cardiac pacemaker: Secondary | ICD-10-CM | POA: Diagnosis not present

## 2021-10-17 DIAGNOSIS — D649 Anemia, unspecified: Secondary | ICD-10-CM | POA: Diagnosis not present

## 2021-10-17 DIAGNOSIS — E1165 Type 2 diabetes mellitus with hyperglycemia: Secondary | ICD-10-CM | POA: Diagnosis not present

## 2021-10-17 DIAGNOSIS — F01B2 Vascular dementia, moderate, with psychotic disturbance: Secondary | ICD-10-CM | POA: Diagnosis not present

## 2021-10-17 NOTE — Progress Notes (Signed)
Remote pacemaker transmission.   

## 2021-10-20 DIAGNOSIS — Z950 Presence of cardiac pacemaker: Secondary | ICD-10-CM | POA: Diagnosis not present

## 2021-10-24 DIAGNOSIS — E1165 Type 2 diabetes mellitus with hyperglycemia: Secondary | ICD-10-CM | POA: Diagnosis not present

## 2021-10-24 DIAGNOSIS — F03B2 Unspecified dementia, moderate, with psychotic disturbance: Secondary | ICD-10-CM | POA: Diagnosis not present

## 2021-10-24 DIAGNOSIS — I10 Essential (primary) hypertension: Secondary | ICD-10-CM | POA: Diagnosis not present

## 2021-10-24 DIAGNOSIS — N189 Chronic kidney disease, unspecified: Secondary | ICD-10-CM | POA: Diagnosis not present

## 2021-10-24 DIAGNOSIS — Z0000 Encounter for general adult medical examination without abnormal findings: Secondary | ICD-10-CM | POA: Diagnosis not present

## 2021-10-24 DIAGNOSIS — H409 Unspecified glaucoma: Secondary | ICD-10-CM | POA: Diagnosis not present

## 2021-10-24 DIAGNOSIS — E785 Hyperlipidemia, unspecified: Secondary | ICD-10-CM | POA: Diagnosis not present

## 2021-11-01 DIAGNOSIS — F03B2 Unspecified dementia, moderate, with psychotic disturbance: Secondary | ICD-10-CM | POA: Diagnosis not present

## 2021-11-01 DIAGNOSIS — N189 Chronic kidney disease, unspecified: Secondary | ICD-10-CM | POA: Diagnosis not present

## 2021-11-01 DIAGNOSIS — Z9114 Patient's other noncompliance with medication regimen: Secondary | ICD-10-CM | POA: Diagnosis not present

## 2021-11-01 DIAGNOSIS — E1165 Type 2 diabetes mellitus with hyperglycemia: Secondary | ICD-10-CM | POA: Diagnosis not present

## 2021-11-07 DIAGNOSIS — N189 Chronic kidney disease, unspecified: Secondary | ICD-10-CM | POA: Diagnosis not present

## 2021-11-07 DIAGNOSIS — E1165 Type 2 diabetes mellitus with hyperglycemia: Secondary | ICD-10-CM | POA: Diagnosis not present

## 2021-11-07 DIAGNOSIS — F01B18 Vascular dementia, moderate, with other behavioral disturbance: Secondary | ICD-10-CM | POA: Diagnosis not present

## 2021-11-07 DIAGNOSIS — I10 Essential (primary) hypertension: Secondary | ICD-10-CM | POA: Diagnosis not present

## 2021-11-12 ENCOUNTER — Other Ambulatory Visit: Payer: Self-pay

## 2021-11-12 ENCOUNTER — Emergency Department (HOSPITAL_COMMUNITY)
Admission: EM | Admit: 2021-11-12 | Discharge: 2021-11-13 | Disposition: A | Discharge status: Home / Self Care | Payer: Medicare Other | Attending: Emergency Medicine | Admitting: Emergency Medicine

## 2021-11-12 DIAGNOSIS — R6889 Other general symptoms and signs: Secondary | ICD-10-CM | POA: Diagnosis not present

## 2021-11-12 DIAGNOSIS — I10 Essential (primary) hypertension: Secondary | ICD-10-CM | POA: Diagnosis not present

## 2021-11-12 DIAGNOSIS — Z794 Long term (current) use of insulin: Secondary | ICD-10-CM | POA: Diagnosis not present

## 2021-11-12 DIAGNOSIS — E1165 Type 2 diabetes mellitus with hyperglycemia: Secondary | ICD-10-CM | POA: Diagnosis not present

## 2021-11-12 DIAGNOSIS — I2510 Atherosclerotic heart disease of native coronary artery without angina pectoris: Secondary | ICD-10-CM | POA: Insufficient documentation

## 2021-11-12 DIAGNOSIS — Z7984 Long term (current) use of oral hypoglycemic drugs: Secondary | ICD-10-CM | POA: Diagnosis not present

## 2021-11-12 DIAGNOSIS — R739 Hyperglycemia, unspecified: Secondary | ICD-10-CM | POA: Diagnosis not present

## 2021-11-12 DIAGNOSIS — Z743 Need for continuous supervision: Secondary | ICD-10-CM | POA: Diagnosis not present

## 2021-11-12 LAB — CBC
HCT: 31.7 % — ABNORMAL LOW (ref 36.0–46.0)
Hemoglobin: 10.4 g/dL — ABNORMAL LOW (ref 12.0–15.0)
MCH: 28.5 pg (ref 26.0–34.0)
MCHC: 32.8 g/dL (ref 30.0–36.0)
MCV: 86.8 fL (ref 80.0–100.0)
Platelets: 209 10*3/uL (ref 150–400)
RBC: 3.65 MIL/uL — ABNORMAL LOW (ref 3.87–5.11)
RDW: 13.1 % (ref 11.5–15.5)
WBC: 5.2 10*3/uL (ref 4.0–10.5)
nRBC: 0 % (ref 0.0–0.2)

## 2021-11-12 LAB — BASIC METABOLIC PANEL
Anion gap: 6 (ref 5–15)
BUN: 28 mg/dL — ABNORMAL HIGH (ref 8–23)
CO2: 24 mmol/L (ref 22–32)
Calcium: 8.7 mg/dL — ABNORMAL LOW (ref 8.9–10.3)
Chloride: 105 mmol/L (ref 98–111)
Creatinine, Ser: 1.35 mg/dL — ABNORMAL HIGH (ref 0.44–1.00)
GFR, Estimated: 39 mL/min — ABNORMAL LOW (ref >60)
Glucose, Bld: 410 mg/dL — ABNORMAL HIGH (ref 70–99)
Potassium: 4.3 mmol/L (ref 3.5–5.1)
Sodium: 135 mmol/L (ref 135–145)

## 2021-11-12 LAB — CBG MONITORING, ED: Glucose-Capillary: 172 mg/dL — ABNORMAL HIGH (ref 70–99)

## 2021-11-12 MED ORDER — HYDRALAZINE HCL 25 MG PO TABS
50.0000 mg | ORAL_TABLET | Freq: Once | ORAL | Status: AC
Start: 2021-11-12 — End: 2021-11-12
  Administered 2021-11-12: 50 mg via ORAL
  Filled 2021-11-12: qty 2

## 2021-11-12 MED ORDER — INSULIN ASPART 100 UNIT/ML IJ SOLN
12.0000 [IU] | Freq: Once | INTRAMUSCULAR | Status: AC
  Administered 2021-11-12: 12 [IU] via SUBCUTANEOUS
  Filled 2021-11-12: qty 0.12

## 2021-11-12 NOTE — ED Notes (Signed)
[facility] called  

## 2021-11-12 NOTE — ED Triage Notes (Signed)
Pt BIB EMS from [facility] for hyperglycemia. Refused insulin at the facility as ordered by their physician. EMS reported blood sugar 528 and gave 400 of NS.  Recheck showed CBG 455.  Pt  ?

## 2021-11-12 NOTE — ED Notes (Signed)
Report called to Kathlee Nations at [facility] ?

## 2021-11-12 NOTE — ED Provider Notes (Signed)
?[place] DEPT ?Provider Note ? ? ?CSN: [id number] ?Arrival date & time: 11/12/21  1644 ? ?  ? ?History ? ?Chief Complaint  ?Patient presents with  ? Hyperglycemia  ? ? ?Gail Lynch is a 81 y.o. female. ? ?Patient presents for nursing home.  Reportedly refused to receive her insulin dose there, they checked her blood sugar and found it was over 500 and brought her here to the ER.  Patient denies any pain or discomfort denies any fevers or headache or chest pain or abdominal pain no vomiting or diarrhea reported.  She states that the people at the facility "do not know what they are doing" she states that they tried to give her insulin without checking her blood sugar.  I advised her that her blood sugar today was indeed elevated and that she would benefit from insulin. ? ? ?  ? ?Home Medications ?Prior to Admission medications   ?Medication Sig Start Date End Date Taking? Authorizing Provider  ?acetaminophen (TYLENOL) 500 MG tablet Take 500-1,000 mg by mouth every 6 (six) hours as needed for mild pain or headache.    [provider]  ?allopurinol (ZYLOPRIM) 300 MG tablet Take 1 tablet (300 mg total) by mouth 2 (two) times daily. 02/04/21   Biagio Borg, MD  ?allopurinol (ZYLOPRIM) 300 MG tablet Take 1 tablet (300 mg total) by mouth 2 (two) times daily for 14 days. 07/07/21 07/21/21  Jeanell Sparrow, DO  ?diazepam (VALIUM) 5 MG tablet Take 1 tablet (5 mg total) by mouth every 6 (six) hours as needed for anxiety. ?Patient not taking: Reported on 06/30/2021 03/15/21   Biagio Borg, MD  ?diazepam (VALIUM) 5 MG tablet Take 1 tablet (5 mg total) by mouth every 6 (six) hours as needed for up to 2 doses for anxiety. 07/07/21   Wynona Dove A, DO  ?glucose blood (ONETOUCH VERIO) test strip 1 each by Other route 2 (two) times daily. And lancets 2/day 08/03/20   Renato Shin, MD  ?hydrALAZINE (APRESOLINE) 25 MG tablet Take 1 tablet (25 mg total) by mouth every 8 (eight) hours. ?Patient  not taking: Reported on 06/30/2021 02/04/21   Biagio Borg, MD  ?hydrALAZINE (APRESOLINE) 25 MG tablet Take 1 tablet (25 mg total) by mouth 3 (three) times daily for 14 days. 07/07/21 07/21/21  Jeanell Sparrow, DO  ?hydrochlorothiazide (HYDRODIURIL) 12.5 MG tablet Take 2 tablets (25 mg total) by mouth daily for 14 days. 07/07/21 07/21/21  Jeanell Sparrow, DO  ?hydrochlorothiazide (MICROZIDE) 12.5 MG capsule Take 1 capsule (12.5 mg total) by mouth daily. ?Patient not taking: Reported on 06/30/2021 02/04/21 02/04/22  Biagio Borg, MD  ?Insulin NPH, Human,, Isophane, (HUMULIN N KWIKPEN) 100 UNIT/ML Kiwkpen Inject 50 Units into the skin every morning. ?Patient not taking: Reported on 06/30/2021 08/03/20   Renato Shin, MD  ?Lancets [facility] PLUS [id number]) [place] Test bid 12/28/20   [provider]  ?metFORMIN (GLUCOPHAGE) 500 MG tablet Take 1 tablet (500 mg total) by mouth 2 (two) times daily with a meal for 14 days. 07/07/21 07/21/21  Jeanell Sparrow, DO  ?QUEtiapine (SEROQUEL) 25 MG tablet Take 1 tablet (25 mg total) by mouth at bedtime for 14 days. 07/07/21 07/21/21  Jeanell Sparrow, DO  ?rosuvastatin (CRESTOR) 40 MG tablet Take 1 tablet (40 mg total) by mouth daily. ?Patient not taking: Reported on 06/30/2021 02/04/21   Biagio Borg, MD  ?rosuvastatin (CRESTOR) 40 MG tablet Take 1 tablet (40 mg  total) by mouth daily for 14 days. 07/07/21 07/21/21  Jeanell Sparrow, DO  ?   ? ?Allergies    ?Aspirin and Penicillins   ? ?Review of Systems   ?Review of Systems  ?Constitutional:  Negative for fever.  ?HENT:  Negative for ear pain.   ?Eyes:  Negative for pain.  ?Respiratory:  Negative for cough.   ?Cardiovascular:  Negative for chest pain.  ?Gastrointestinal:  Negative for abdominal pain.  ?Genitourinary:  Negative for flank pain.  ?Musculoskeletal:  Negative for back pain.  ?Skin:  Negative for rash.  ?Neurological:  Negative for headaches.  ? ?Physical Exam ?Updated Vital Signs ?BP (!) 167/70   Pulse 80   Temp  97.9 ?F (36.6 ?C) (Oral)   Resp 18   SpO2 100%  ?Physical Exam ?Constitutional:   ?   General: She is not in acute distress. ?   Appearance: Normal appearance.  ?HENT:  ?   Head: Normocephalic.  ?   Nose: Nose normal.  ?Eyes:  ?   Extraocular Movements: Extraocular movements intact.  ?Cardiovascular:  ?   Rate and Rhythm: Normal rate.  ?Pulmonary:  ?   Effort: Pulmonary effort is normal.  ?Musculoskeletal:     ?   General: Normal range of motion.  ?   Cervical back: Normal range of motion.  ?Neurological:  ?   General: No focal deficit present.  ?   Mental Status: She is alert. Mental status is at baseline.  ? ? ?ED Results / Procedures / Treatments   ?Labs ?(all labs ordered are listed, but only abnormal results are displayed) ?Labs Reviewed  ?BASIC METABOLIC PANEL - Abnormal; Notable for the following components:  ?    Result Value  ? Glucose, Bld 410 (*)   ? BUN 28 (*)   ? Creatinine, Ser 1.35 (*)   ? Calcium 8.7 (*)   ? GFR, Estimated 39 (*)   ? All other components within normal limits  ?CBC - Abnormal; Notable for the following components:  ? RBC 3.65 (*)   ? Hemoglobin 10.4 (*)   ? HCT 31.7 (*)   ? All other components within normal limits  ?CBG MONITORING, ED - Abnormal; Notable for the following components:  ? Glucose-Capillary 172 (*)   ? All other components within normal limits  ?URINALYSIS, ROUTINE W REFLEX MICROSCOPIC  ?CBG MONITORING, ED  ?CBG MONITORING, ED  ? ? ?EKG ?None ? ?Radiology ?No results found. ? ?Procedures ?Procedures  ? ? ?Medications Ordered in ED ?Medications  ?hydrALAZINE (APRESOLINE) tablet 50 mg (50 mg Oral Given 11/12/21 1858)  ?insulin aspart (novoLOG) injection 12 Units (12 Units Subcutaneous Given 11/12/21 1858)  ? ? ?ED Course/ Medical Decision Making/ A&P ?  ?                        ?Medical Decision Making ?Amount and/or Complexity of Data Reviewed ?Labs: ordered. ? ?Risk ?Prescription drug management. ? ? ?Chart review shows outpatient visit with cardiology October 11, 2021. ? ?Patient placed on cardiac monitor showing sinus rhythm. ? ?Comorbidities influencing complexity include coronary artery disease, hypertension and diabetes. ? ?Labs are sent.  Glucose elevated greater than 400.  No evidence of DKA.  CBC otherwise normal. ? ?Patient given IV fluid resuscitation and insulin with subsequent significant improvement of blood sugar 272.  Blood pressure was elevated at about 614 systolic, given hydralazine with significant proving as well. ? ?Patient remains awake and alert with  no complaints, discharged back to facility in stable condition.  Advise follow-up with her doctor within the week. ? ? ? ? ? ? ? ?Final Clinical Impression(s) / ED Diagnoses ?Final diagnoses:  ?Hyperglycemia  ?Hypertension, unspecified type  ? ? ?Rx / DC Orders ?ED Discharge Orders   ? ? None  ? ?  ? ? ?  ?Luna Fuse, MD ?11/12/21 2034 ? ?

## 2021-11-12 NOTE — Discharge Instructions (Signed)
Call your primary care doctor or specialist as discussed in the next 2-3 days.   Return immediately back to the ER if:  Your symptoms worsen within the next 12-24 hours. You develop new symptoms such as new fevers, persistent vomiting, new pain, shortness of breath, or new weakness or numbness, or if you have any other concerns.  

## 2021-11-13 DIAGNOSIS — R739 Hyperglycemia, unspecified: Secondary | ICD-10-CM | POA: Diagnosis not present

## 2021-11-13 DIAGNOSIS — R404 Transient alteration of awareness: Secondary | ICD-10-CM | POA: Diagnosis not present

## 2021-11-13 DIAGNOSIS — Z743 Need for continuous supervision: Secondary | ICD-10-CM | POA: Diagnosis not present

## 2021-11-14 DIAGNOSIS — F01B18 Vascular dementia, moderate, with other behavioral disturbance: Secondary | ICD-10-CM | POA: Diagnosis not present

## 2021-11-14 DIAGNOSIS — I10 Essential (primary) hypertension: Secondary | ICD-10-CM | POA: Diagnosis not present

## 2021-11-14 DIAGNOSIS — Z9114 Patient's other noncompliance with medication regimen: Secondary | ICD-10-CM | POA: Diagnosis not present

## 2021-11-14 DIAGNOSIS — E1165 Type 2 diabetes mellitus with hyperglycemia: Secondary | ICD-10-CM | POA: Diagnosis not present

## 2021-11-14 DIAGNOSIS — F03B2 Unspecified dementia, moderate, with psychotic disturbance: Secondary | ICD-10-CM | POA: Diagnosis not present

## 2021-11-21 NOTE — Progress Notes (Signed)
? ?Cardiology Office Note ?Date:  11/21/2021  ?Patient ID:  Gail Lynch, DOB 1941-05-14, MRN [id number] ?PCP:  Pcp, No  ?Electrophysiologist: Dr. Curt Bears ? ?  ?Chief Complaint:  over due ? ?History of Present Illness: ?[name] is a 81 y.o. female with history of HTN, DM (poorly controlled), HLD, CHB w/PPM ? ?She comes in today to be seen for Dr. Curt Bears last seen by him Oct 2021, doing well, device functioning well, no changes were made. ? ? ?ER visits: ?01/21/2021: hallucinations, progressive AMS for months, unclear disposition from here, perhaps IVC ?04/22/21: family sent for worsening paranoia, confusion, not admitted ?06/29/21, fall ?06/30/21: psych eval, hallucinations, looks like she spent DAYS in the ER and discharged to [facility] 11/17 ?09/13/21: dementia, agitation ?11/12/21: hyperglycemia, pt had refused insulin (at [facility]) and BS >500 ? ?TODAY ?She is accompanied by a [facility] staff member ?The pt mentions a slight R sided check ache that she woke with this AM, and "just went away", no other specifics. ?Denies CP otherwise, no palpitations. ?No SOB ?NO near syncope or syncope. ? ?[place] vitals reviewed, looks good ? ?Device information ?MDT dual chamber PPM implanted 03/09/2020 ? ?Past Medical History:  ?Diagnosis Date  ? Acute lower GI bleeding 05/03/2020  ? Arthritis   ? gout in feet  ? Asthma   ? hx as a child - no inhaler, no problems  ? Carotid stenosis 06/18/2012  ? Colon polyps   ? Diabetes mellitus   ? Type 2  ? Dysplasia of cervix, low grade (CIN 1)   ? s/p cervical conization  ? Glaucoma   ? Glaucoma 12/18/2011  ? Hard of hearing   ? hear aids - left  ? HPV (human papilloma virus) infection   ? Hyperlipidemia   ? Hypertension   ? Intrinsic asthma, unspecified 12/18/2011  ? Mild aortic stenosis 03/14/2017  ? Mycotic toenails 02/19/2013  ? PONV (postoperative nausea and vomiting)   ? SVD (spontaneous vaginal delivery)   ? x 4  ? ? ?Past Surgical History:  ?Procedure Laterality Date  ? BREAST  BIOPSY  2010  ? BREAST BIOPSY  1991  ? COLONOSCOPY    ? COLONOSCOPY WITH PROPOFOL N/A 05/06/2020  ? Procedure: COLONOSCOPY WITH PROPOFOL;  Surgeon: Jerene Bears, MD;  Location: [facility] ENDOSCOPY;  Service: Endoscopy;  Laterality: N/A;  ? [place] OF UTERUS  2000  ? HYSTEROSCOPY WITH D & C N/A 03/04/2014  ? Procedure: DILATATION AND CURETTAGE /HYSTEROSCOPY;  Surgeon: Lavonia Drafts, MD;  Location: [place] [facility];  Service: Gynecology;  Laterality: N/A;  ? KNEE SURGERY  2010  ? patella fracture  ? MULTIPLE TOOTH EXTRACTIONS    ? PACEMAKER IMPLANT N/A 03/09/2020  ? Procedure: PACEMAKER IMPLANT;  Surgeon: Constance Haw, MD;  Location: [place] [facility];  Service: Cardiovascular;  Laterality: N/A;  ? POLYPECTOMY    ? POLYPECTOMY  05/06/2020  ? Procedure: POLYPECTOMY;  Surgeon: Jerene Bears, MD;  Location: [facility] ENDOSCOPY;  Service: Endoscopy;;  ? ? ?Current Outpatient Medications  ?Medication Sig Dispense Refill  ? acetaminophen (TYLENOL) 500 MG tablet Take 500-1,000 mg by mouth every 6 (six) hours as needed for mild pain or headache.    ? allopurinol (ZYLOPRIM) 300 MG tablet Take 1 tablet (300 mg total) by mouth 2 (two) times daily. 90 tablet 3  ? allopurinol (ZYLOPRIM) 300 MG tablet Take 1 tablet (300 mg total) by mouth 2 (two) times daily for 14 days. 28 tablet 0  ? diazepam (  VALIUM) 5 MG tablet Take 1 tablet (5 mg total) by mouth every 6 (six) hours as needed for anxiety. (Patient not taking: Reported on 06/30/2021) 1 tablet 0  ? diazepam (VALIUM) 5 MG tablet Take 1 tablet (5 mg total) by mouth every 6 (six) hours as needed for up to 2 doses for anxiety. 2 tablet 0  ? glucose blood (ONETOUCH VERIO) test strip 1 each by Other route 2 (two) times daily. And lancets 2/day 200 each 12  ? hydrALAZINE (APRESOLINE) 25 MG tablet Take 1 tablet (25 mg total) by mouth every 8 (eight) hours. (Patient not taking: Reported on 06/30/2021) 90 tablet 3  ? hydrALAZINE (APRESOLINE) 25 MG tablet Take 1 tablet (25 mg total)  by mouth 3 (three) times daily for 14 days. 42 tablet 0  ? hydrochlorothiazide (HYDRODIURIL) 12.5 MG tablet Take 2 tablets (25 mg total) by mouth daily for 14 days. 28 tablet 0  ? hydrochlorothiazide (MICROZIDE) 12.5 MG capsule Take 1 capsule (12.5 mg total) by mouth daily. (Patient not taking: Reported on 06/30/2021) 90 capsule 3  ? Insulin NPH, Human,, Isophane, (HUMULIN N KWIKPEN) 100 UNIT/ML Kiwkpen Inject 50 Units into the skin every morning. (Patient not taking: Reported on 06/30/2021) 30 mL 11  ? Lancets (ONETOUCH DELICA PLUS [id number]) MISC Test bid    ? metFORMIN (GLUCOPHAGE) 500 MG tablet Take 1 tablet (500 mg total) by mouth 2 (two) times daily with a meal for 14 days. 28 tablet 0  ? QUEtiapine (SEROQUEL) 25 MG tablet Take 1 tablet (25 mg total) by mouth at bedtime for 14 days. 14 tablet 0  ? rosuvastatin (CRESTOR) 40 MG tablet Take 1 tablet (40 mg total) by mouth daily. (Patient not taking: Reported on 06/30/2021) 90 tablet 3  ? rosuvastatin (CRESTOR) 40 MG tablet Take 1 tablet (40 mg total) by mouth daily for 14 days. 14 tablet 0  ? ?No current facility-administered medications for this visit.  ? ? ?Allergies:   Aspirin and Penicillins  ? ?Social History:  The patient  reports that she quit smoking about 41 years ago. Her smoking use included cigarettes. She has quit using smokeless tobacco. She reports that she does not drink alcohol and does not use drugs.  ? ?Family History:  The patient's family history includes Asthma in an other family member; Diabetes in her brother; Heart disease in her brother, father, and mother. ? ?ROS:  Please see the history of present illness.    ?All other systems are reviewed and otherwise negative.  ? ?PHYSICAL EXAM:  ?VS:  There were no vitals taken for this visit. BMI: There is no height or weight on file to calculate BMI. ?Well nourished, well developed, in no acute distress ?HEENT: normocephalic, atraumatic ?Neck: no JVD, carotid bruits or masses ?Cardiac:  RRR; no  significant murmurs, no rubs, or gallops ?Lungs:  CTA b/l, no wheezing, rhonchi or rales ?Abd: soft, nontender ?MS: no deformity or atrophy ?Ext: no edema ?Skin: warm and dry, no rash ?Neuro:  No gross deficits appreciated ?Psych: euthymic mood, full affect ? ?PPM site is stable, no tethering or discomfort ? ? ?EKG:  done today and reviewed by myself ?SR/VP and AV pacing, 64bpm ? ?Device interrogation done today and reviewed by myself:  ?Battery and lead measurements are good ?one NSVT (one second) ?2 AF (10 and 13 seconds) ? ? ? TTE 03/09/2020  ? 1. Left ventricular ejection fraction, by estimation, is 60 to 65%. The  ?left ventricle has normal function. Left  ventricular diastolic function  ?could not be evaluated.  ? 2. Right ventricular systolic function is normal. The right ventricular  ?size is normal. Tricuspid regurgitation signal is inadequate for assessing  ?PA pressure.  ? 3. Left atrial size was mild to moderately dilated.  ? 4. The mitral valve is degenerative. Trivial mitral valve regurgitation.  ?No evidence of mitral stenosis.  ? 5. The aortic valve is tricuspid. Aortic valve regurgitation is not  ?visualized. Mild aortic valve stenosis. Aortic valve area, by VTI measures  ?1.79 cm?Marland Kitchen Aortic valve mean gradient measures 4.6 mmHg. Aortic valve Vmax  ?measures 1.59 m/s.  ? 6. The inferior vena cava is dilated in size with >50% respiratory  ?variability, suggesting right atrial pressure of 8 mmHg.  ? ?Recent Labs: ?06/29/2021: Magnesium 1.6 ?06/30/2021: ALT 14 ?11/12/2021: BUN 28; Creatinine, Ser 1.35; Hemoglobin 10.4; Platelets 209; Potassium 4.3; Sodium 135  ?No results found for requested labs within last 8760 hours.  ? ?CrCl cannot be calculated (Unknown ideal weight.).  ? ?Wt Readings from Last 3 Encounters:  ?04/22/21 185 lb (83.9 kg)  ?08/03/20 176 lb 9.6 oz (80.1 kg)  ?07/29/20 174 lb (78.9 kg)  ?  ? ?Other studies reviewed: ?Additional studies/records reviewed today include: summarized  above ? ?ASSESSMENT AND PLAN: ? ?PPM ?Intact function, no programming changes made ? ?HTN ?Looks good ? ?3. 2 AF episodes on device ?VERY short, markers look more like PACs, perhaps extremely short AF ?Follow via her

## 2021-11-23 ENCOUNTER — Ambulatory Visit (INDEPENDENT_AMBULATORY_CARE_PROVIDER_SITE_OTHER): Payer: Medicare Other | Admitting: Physician Assistant

## 2021-11-23 ENCOUNTER — Encounter: Payer: Self-pay | Admitting: Physician Assistant

## 2021-11-23 VITALS — BP 120/70 | HR 76 | Ht 63.0 in | Wt 154.4 lb

## 2021-11-23 DIAGNOSIS — Z950 Presence of cardiac pacemaker: Secondary | ICD-10-CM

## 2021-11-23 DIAGNOSIS — I10 Essential (primary) hypertension: Secondary | ICD-10-CM | POA: Diagnosis not present

## 2021-11-23 DIAGNOSIS — R0789 Other chest pain: Secondary | ICD-10-CM

## 2021-11-23 DIAGNOSIS — I442 Atrioventricular block, complete: Secondary | ICD-10-CM

## 2021-11-23 LAB — CUP PACEART INCLINIC DEVICE CHECK
Battery Remaining Longevity: 102 mo
Battery Voltage: 3.01 V
Brady Statistic AP VP Percent: 14.48 %
Brady Statistic AP VS Percent: 0 %
Brady Statistic AS VP Percent: 85.43 %
Brady Statistic AS VS Percent: 0.09 %
Brady Statistic RA Percent Paced: 14.22 %
Brady Statistic RV Percent Paced: 99.91 %
Date Time Interrogation Session: 20230405133034
Implantable Lead Implant Date: 20210720
Implantable Lead Implant Date: 20210720
Implantable Lead Location: 753859
Implantable Lead Location: 753860
Implantable Lead Model: 5076
Implantable Lead Model: 5076
Implantable Pulse Generator Implant Date: 20210720
Lead Channel Impedance Value: 266 Ohm
Lead Channel Impedance Value: 285 Ohm
Lead Channel Impedance Value: 304 Ohm
Lead Channel Impedance Value: 342 Ohm
Lead Channel Pacing Threshold Amplitude: 0.375 V
Lead Channel Pacing Threshold Amplitude: 0.625 V
Lead Channel Pacing Threshold Pulse Width: 0.4 ms
Lead Channel Pacing Threshold Pulse Width: 0.4 ms
Lead Channel Sensing Intrinsic Amplitude: 1.25 mV
Lead Channel Sensing Intrinsic Amplitude: 1.25 mV
Lead Channel Sensing Intrinsic Amplitude: 2 mV
Lead Channel Sensing Intrinsic Amplitude: 3.625 mV
Lead Channel Setting Pacing Amplitude: 1.5 V
Lead Channel Setting Pacing Amplitude: 2.5 V
Lead Channel Setting Pacing Pulse Width: 0.4 ms
Lead Channel Setting Sensing Sensitivity: 1.2 mV

## 2021-11-23 NOTE — Patient Instructions (Signed)
Medication Instructions:  ? ?Your physician recommends that you continue on your current medications as directed. Please refer to the Current Medication list given to you today. ? ?*If you need a refill on your cardiac medications before your next appointment, please call your pharmacy* ? ? ?Lab Work: [place] ? ? ?If you have labs (blood work) drawn today and your tests are completely normal, you will receive your results only by: ?MyChart Message (if you have MyChart) OR ?A paper copy in the mail ?If you have any lab test that is abnormal or we need to change your treatment, we will call you to review the results. ? ? ?Testing/Procedures:  NONE ORDERED  TODAY ? ? ?Follow-Up: ?At [facility], you and your health needs are our priority.  As part of our continuing mission to provide you with exceptional heart care, we have created designated Provider Care Teams.  These Care Teams include your primary Cardiologist (physician) and Advanced Practice Providers (APPs -  Physician Assistants and Nurse Practitioners) who all work together to provide you with the care you need, when you need it. ? ?We recommend signing up for the patient portal called "MyChart".  Sign up information is provided on this After Visit Summary.  MyChart is used to connect with patients for Virtual Visits (Telemedicine).  Patients are able to view lab/test results, encounter notes, upcoming appointments, etc.  Non-urgent messages can be sent to your provider as well.   ?To learn more about what you can do with MyChart, go to [email or web address].   ? ?Your next appointment:   ?1 year(s) ? ?The format for your next appointment:   ?In Person ? ?Provider:   ?You may see Will Meredith Leeds, MD or one of the following Advanced Practice Providers on your designated Care Team:   ?Tommye Standard, PA-C ? ? ?If primary card or EP is not listed click here to update    :1}  ? ? ?Other Instructions ? ?

## 2022-04-16 ENCOUNTER — Emergency Department (HOSPITAL_COMMUNITY): Payer: Medicare Other

## 2022-04-16 ENCOUNTER — Encounter (HOSPITAL_COMMUNITY): Payer: Self-pay

## 2022-04-16 ENCOUNTER — Other Ambulatory Visit: Payer: Self-pay

## 2022-04-16 ENCOUNTER — Emergency Department (HOSPITAL_COMMUNITY)
Admission: EM | Admit: 2022-04-16 | Discharge: 2022-04-16 | Disposition: A | Discharge status: Home / Self Care | Payer: Medicare Other | Attending: Emergency Medicine | Admitting: Emergency Medicine

## 2022-04-16 DIAGNOSIS — E860 Dehydration: Secondary | ICD-10-CM

## 2022-04-16 DIAGNOSIS — U071 COVID-19: Secondary | ICD-10-CM | POA: Diagnosis not present

## 2022-04-16 DIAGNOSIS — R638 Other symptoms and signs concerning food and fluid intake: Secondary | ICD-10-CM | POA: Insufficient documentation

## 2022-04-16 LAB — CBC
HCT: 38.3 % (ref 36.0–46.0)
Hemoglobin: 12.4 g/dL (ref 12.0–15.0)
MCH: 27.4 pg (ref 26.0–34.0)
MCHC: 32.4 g/dL (ref 30.0–36.0)
MCV: 84.7 fL (ref 80.0–100.0)
Platelets: 291 10*3/uL (ref 150–400)
RBC: 4.52 MIL/uL (ref 3.87–5.11)
RDW: 13.4 % (ref 11.5–15.5)
WBC: 13.5 10*3/uL — ABNORMAL HIGH (ref 4.0–10.5)
nRBC: 0 % (ref 0.0–0.2)

## 2022-04-16 LAB — BASIC METABOLIC PANEL
Anion gap: 11 (ref 5–15)
BUN: 42 mg/dL — ABNORMAL HIGH (ref 8–23)
CO2: 25 mmol/L (ref 22–32)
Calcium: 9 mg/dL (ref 8.9–10.3)
Chloride: 102 mmol/L (ref 98–111)
Creatinine, Ser: 1.5 mg/dL — ABNORMAL HIGH (ref 0.44–1.00)
GFR, Estimated: 34 mL/min — ABNORMAL LOW (ref >60)
Glucose, Bld: 205 mg/dL — ABNORMAL HIGH (ref 70–99)
Potassium: 3.5 mmol/L (ref 3.5–5.1)
Sodium: 138 mmol/L (ref 135–145)

## 2022-04-16 LAB — CBG MONITORING, ED: Glucose-Capillary: 201 mg/dL — ABNORMAL HIGH (ref 70–99)

## 2022-04-16 MED ORDER — LACTATED RINGERS IV BOLUS
500.0000 mL | Freq: Once | INTRAVENOUS | Status: AC
  Administered 2022-04-16: 500 mL via INTRAVENOUS

## 2022-04-16 NOTE — ED Notes (Signed)
Pt given PO challenge with soda and crackers. No episodes of nausea or vomiting. Pt ambulated with walker in room.

## 2022-04-16 NOTE — ED Provider Notes (Signed)
[facility] DEPT Provider Note   CSN: [id number] Arrival date & time: 04/16/22  1146     History  Chief Complaint  Patient presents with   Follow-up    COVID    Gail Lynch is a 81 y.o. female.  Patient is an 81 year old female with a history of hypertension, hyperlipidemia, carotic stenosis, asthma, diabetes, mild aortic stenosis and prior lower GI bleeding who is presenting today from [facility] because she has not eaten or drank today.  Patient was diagnosed with COVID on 822 and started on Paxlovid on 824.  There is no report of vomiting or diarrhea.  Patient denies any pain.  When asked why she is not eating or drinking she just states she does not feel like it.  She typically is able to ambulate with a walker and is unknown if she has gotten up or walk today.  Spoke with patient's legal guardian Gail Lynch weeks and she was not aware that she was sent to the emergency room today.  She reports she normally does pretty well at home but they do assist her with meals and medications.  There is no reported falls.  The history is provided by medical records and the nursing home.       Home Medications Prior to Admission medications   Medication Sig Start Date End Date Taking? Authorizing Provider  acetaminophen (TYLENOL) 500 MG tablet Take 500-1,000 mg by mouth every 6 (six) hours as needed for mild pain or headache.    [provider]  allopurinol (ZYLOPRIM) 300 MG tablet Take 1 tablet (300 mg total) by mouth 2 (two) times daily. Patient taking differently: Take 300 mg by mouth as needed. 02/04/21   Biagio Borg, MD  allopurinol (ZYLOPRIM) 300 MG tablet Take 1 tablet (300 mg total) by mouth 2 (two) times daily for 14 days. 07/07/21 07/21/21  Jeanell Sparrow, DO  diazepam (VALIUM) 5 MG tablet Take 1 tablet (5 mg total) by mouth every 6 (six) hours as needed for anxiety. 03/15/21   Biagio Borg, MD  diazepam (VALIUM) 5 MG tablet Take 1 tablet (5  mg total) by mouth every 6 (six) hours as needed for up to 2 doses for anxiety. 07/07/21   Wynona Dove A, DO  glucose blood (ONETOUCH VERIO) test strip 1 each by Other route 2 (two) times daily. And lancets 2/day 08/03/20   Renato Shin, MD  hydrALAZINE (APRESOLINE) 25 MG tablet Take 1 tablet (25 mg total) by mouth every 8 (eight) hours. 02/04/21   Biagio Borg, MD  hydrALAZINE (APRESOLINE) 25 MG tablet Take 1 tablet (25 mg total) by mouth 3 (three) times daily for 14 days. 07/07/21 07/21/21  Jeanell Sparrow, DO  hydrochlorothiazide (HYDRODIURIL) 12.5 MG tablet Take 2 tablets (25 mg total) by mouth daily for 14 days. 07/07/21 07/21/21  Jeanell Sparrow, DO  hydrochlorothiazide (MICROZIDE) 12.5 MG capsule Take 1 capsule (12.5 mg total) by mouth daily. 02/04/21 02/04/22  Biagio Borg, MD  Insulin NPH, Human,, Isophane, (HUMULIN N KWIKPEN) 100 UNIT/ML Kiwkpen Inject 50 Units into the skin every morning. 08/03/20   Renato Shin, MD  Lancets (ONETOUCH DELICA PLUS [id number]) [place] Test bid 12/28/20   [provider]  LANTUS SOLOSTAR 100 UNIT/ML Solostar Pen SMARTSIG:10 Unit(s) SUB-Q Daily 09/05/21   [provider]  loperamide (IMODIUM) 2 MG capsule Take by mouth as needed for diarrhea or loose stools.    [provider]  magnesium hydroxide (MILK OF  MAGNESIA) 400 MG/5ML suspension Take by mouth as needed for mild constipation.    [provider]  metFORMIN (GLUCOPHAGE) 500 MG tablet Take 1 tablet (500 mg total) by mouth 2 (two) times daily with a meal for 14 days. 07/07/21 07/21/21  Jeanell Sparrow, DO  QUEtiapine (SEROQUEL) 25 MG tablet Take 1 tablet (25 mg total) by mouth at bedtime for 14 days. 07/07/21 07/21/21  Jeanell Sparrow, DO  risperiDONE (RISPERDAL) 1 MG/ML oral solution Take 2 mg by mouth 2 (two) times daily. 10/25/21   [provider]  rosuvastatin (CRESTOR) 40 MG tablet Take 1 tablet (40 mg total) by mouth daily. 02/04/21   Biagio Borg, MD  rosuvastatin  (CRESTOR) 40 MG tablet Take 1 tablet (40 mg total) by mouth daily for 14 days. 07/07/21 07/21/21  Jeanell Sparrow, DO      Allergies    Aspirin and Penicillins    Review of Systems   Review of Systems  Physical Exam Updated Vital Signs BP (!) 172/88   Pulse 89   Temp 98.5 F (36.9 C) (Oral)   Resp 17   Ht '5\' 3"'$  (1.6 m)   SpO2 98%   BMI 27.35 kg/m  Physical Exam Vitals and nursing note reviewed.  Constitutional:      General: She is not in acute distress.    Appearance: She is well-developed.     Comments: Patient is a bit sluggish but will open her eyes and answer questions when given time  HENT:     Head: Normocephalic and atraumatic.     Mouth/Throat:     Mouth: Mucous membranes are dry.     Comments: Thick yellow coating over the tongue.  Mucus is present. Eyes:     Pupils: Pupils are equal, round, and reactive to light.  Cardiovascular:     Rate and Rhythm: Normal rate and regular rhythm.     Heart sounds: Normal heart sounds. No murmur heard.    No friction rub.  Pulmonary:     Effort: Pulmonary effort is normal.     Breath sounds: Normal breath sounds. No wheezing or rales.     Comments: Coarse upper airway noise Abdominal:     General: Bowel sounds are normal. There is no distension.     Palpations: Abdomen is soft.     Tenderness: There is no abdominal tenderness. There is no guarding or rebound.  Musculoskeletal:        General: No tenderness. Normal range of motion.     Right lower leg: Edema present.     Left lower leg: Edema present.     Comments: No edema  Skin:    General: Skin is warm and dry.     Capillary Refill: Capillary refill takes 2 to 3 seconds.     Findings: No rash.  Neurological:     Mental Status: She is alert and oriented to person, place, and time.     Cranial Nerves: No cranial nerve deficit.  Psychiatric:        Behavior: Behavior normal.     Comments: Calm and cooperative.     ED Results / Procedures / Treatments    Labs (all labs ordered are listed, but only abnormal results are displayed) Labs Reviewed  BASIC METABOLIC PANEL - Abnormal; Notable for the following components:      Result Value   Glucose, Bld 205 (*)    BUN 42 (*)    Creatinine, Ser 1.50 (*)  GFR, Estimated 34 (*)    All other components within normal limits  CBC - Abnormal; Notable for the following components:   WBC 13.5 (*)    All other components within normal limits  CBG MONITORING, ED - Abnormal; Notable for the following components:   Glucose-Capillary 201 (*)    All other components within normal limits    EKG None  Radiology DG Chest Port 1 View  Result Date: 04/16/2022 CLINICAL DATA:  Cough.  COVID positive. EXAM: PORTABLE CHEST 1 VIEW COMPARISON:  06/29/2021 chest radiograph FINDINGS: The cardiomediastinal silhouette is not significantly changed. RIGHT LOWER lung airspace opacities with consolidation or atelectasis noted. Subsegmental atelectasis/scarring in the LEFT LOWER lung noted. There may be a small RIGHT pleural effusion present. There is no evidence of pneumothorax. LEFT-sided pacemaker again identified. No acute bony abnormalities are noted. IMPRESSION: RIGHT LOWER lung airspace opacities likely representing pneumonia or aspiration. Associated consolidation/aspiration and possible small RIGHT pleural effusion. Electronically Signed   By: Margarette Canada M.D.   On: 04/16/2022 15:37    Procedures Procedures    Medications Ordered in ED Medications  lactated ringers bolus 500 mL (0 mLs Intravenous Stopped 04/16/22 1745)    ED Course/ Medical Decision Making/ A&P                           Medical Decision Making Amount and/or Complexity of Data Reviewed Independent Historian: guardian External Data Reviewed: notes. Labs: ordered. Decision-making details documented in ED Course. Radiology: ordered and independent interpretation performed. Decision-making details documented in ED Course. ECG/medicine  tests: ordered and independent interpretation performed. Decision-making details documented in ED Course.   Pt with multiple medical problems and comorbidities and presenting today with a complaint that caries a high risk for morbidity and mortality.  Here today due to lack of eating and drinking today.  Patient does have known COVID diagnosed on 8/22 and is on her fourth day of Paxlovid.  Patient does appear a bit sluggish but will wake up and answer questions.  She denies any pain.  She has no signs of respiratory distress but does have coarse breath sounds bilaterally.  She does have bilateral edema in her lower extremities, unknown as this is a chronic problem for her.  Concern for dehydration, electrolyte abnormality.  Possibility for viral pneumonia.  Patient is satting 97% on room air.  We will give patient a gentle bolus of fluid.  Chest x-ray is pending.  I independently interpreted patient's labs today and creatinine is at baseline at 1.5, electrolytes are within normal limits, mild leukocytosis with a CBC with a white count of 13.  Blood sugar of 205. I have independently visualized and interpreted pt's images today.  Chest x-ray today with bilateral scarring, no significant new pneumonia.  Will reevaluate after patient is gotten fluids.  Will ensure that patient can ambulate.   6:37 PM After fluid patient reports she is feeling better.  She was able to ambulate with her walker which seems to be her baseline.  She did eat and drink some.  At this time feel that she is stable for discharge home.  Radiology did report on x-ray right lower lobe airspace opacity likely representing pneumonia or aspiration associated consolidation aspiration and small right pleural effusion also reported.  This is most likely from [place].  Patient has not been choking on her food.  At this time she is already on Paxlovid and do not feel she needs  further antibiotics.  Her family member and facility were given return  precautions.  No indication for hospitalization today.  No social barriers affecting her discharge.         Final Clinical Impression(s) / ED Diagnoses Final diagnoses:  COVID  Dehydration    Rx / DC Orders ED Discharge Orders     None         Blanchie Dessert, MD 04/16/22 1839

## 2022-04-16 NOTE — Discharge Instructions (Signed)
blood work looks good.  Make sure you continue to eat and drink.  There is some minimal pneumonia from COVID but continue taking the Paxlovid.  Make sure you are staying hydrated.

## 2022-04-16 NOTE — ED Triage Notes (Signed)
Pt BIB [facility] from [facility]. Pt was dx with COVID on 8/22 and is on antivirals. Pt sent to ED today because pt has yet to eat or drink today. Pt has hx of dementia and is currently at baseline per facility. At baseline pt ambulates with walker.   EMS Vitals  97% on R/A 160/90 82 CBG 215

## 2022-04-25 ENCOUNTER — Other Ambulatory Visit: Payer: Self-pay

## 2022-04-25 ENCOUNTER — Encounter (HOSPITAL_COMMUNITY): Payer: Self-pay | Admitting: Emergency Medicine

## 2022-04-25 ENCOUNTER — Emergency Department (HOSPITAL_COMMUNITY): Payer: Medicare Other

## 2022-04-25 ENCOUNTER — Inpatient Hospital Stay (HOSPITAL_COMMUNITY)
Admission: EM | Admit: 2022-04-25 | Discharge: 2022-05-01 | DRG: 194 | Disposition: A | Discharge status: Skilled Nursing Facility | Payer: Medicare Other | Source: Skilled Nursing Facility | Attending: Internal Medicine | Admitting: Internal Medicine

## 2022-04-25 DIAGNOSIS — Z950 Presence of cardiac pacemaker: Secondary | ICD-10-CM

## 2022-04-25 DIAGNOSIS — F0390 Unspecified dementia without behavioral disturbance: Secondary | ICD-10-CM | POA: Diagnosis present

## 2022-04-25 DIAGNOSIS — E876 Hypokalemia: Secondary | ICD-10-CM | POA: Diagnosis present

## 2022-04-25 DIAGNOSIS — L89610 Pressure ulcer of right heel, unstageable: Secondary | ICD-10-CM | POA: Diagnosis present

## 2022-04-25 DIAGNOSIS — S31000A Unspecified open wound of lower back and pelvis without penetration into retroperitoneum, initial encounter: Secondary | ICD-10-CM | POA: Diagnosis present

## 2022-04-25 DIAGNOSIS — U099 Post covid-19 condition, unspecified: Secondary | ICD-10-CM | POA: Diagnosis present

## 2022-04-25 DIAGNOSIS — N179 Acute kidney failure, unspecified: Secondary | ICD-10-CM | POA: Diagnosis not present

## 2022-04-25 DIAGNOSIS — Z8249 Family history of ischemic heart disease and other diseases of the circulatory system: Secondary | ICD-10-CM

## 2022-04-25 DIAGNOSIS — I442 Atrioventricular block, complete: Secondary | ICD-10-CM | POA: Diagnosis present

## 2022-04-25 DIAGNOSIS — D649 Anemia, unspecified: Secondary | ICD-10-CM | POA: Diagnosis not present

## 2022-04-25 DIAGNOSIS — Z794 Long term (current) use of insulin: Secondary | ICD-10-CM

## 2022-04-25 DIAGNOSIS — N1831 Chronic kidney disease, stage 3a: Secondary | ICD-10-CM | POA: Diagnosis not present

## 2022-04-25 DIAGNOSIS — K625 Hemorrhage of anus and rectum: Secondary | ICD-10-CM | POA: Diagnosis present

## 2022-04-25 DIAGNOSIS — E11649 Type 2 diabetes mellitus with hypoglycemia without coma: Secondary | ICD-10-CM | POA: Diagnosis present

## 2022-04-25 DIAGNOSIS — Z87891 Personal history of nicotine dependence: Secondary | ICD-10-CM

## 2022-04-25 DIAGNOSIS — Z79899 Other long term (current) drug therapy: Secondary | ICD-10-CM

## 2022-04-25 DIAGNOSIS — Z833 Family history of diabetes mellitus: Secondary | ICD-10-CM

## 2022-04-25 DIAGNOSIS — L89150 Pressure ulcer of sacral region, unstageable: Secondary | ICD-10-CM | POA: Diagnosis present

## 2022-04-25 DIAGNOSIS — E1165 Type 2 diabetes mellitus with hyperglycemia: Secondary | ICD-10-CM | POA: Diagnosis present

## 2022-04-25 DIAGNOSIS — Z7984 Long term (current) use of oral hypoglycemic drugs: Secondary | ICD-10-CM

## 2022-04-25 DIAGNOSIS — N183 Chronic kidney disease, stage 3 unspecified: Secondary | ICD-10-CM | POA: Diagnosis present

## 2022-04-25 DIAGNOSIS — F03C11 Unspecified dementia, severe, with agitation: Secondary | ICD-10-CM

## 2022-04-25 DIAGNOSIS — J189 Pneumonia, unspecified organism: Secondary | ICD-10-CM | POA: Diagnosis not present

## 2022-04-25 DIAGNOSIS — U071 COVID-19: Secondary | ICD-10-CM | POA: Diagnosis present

## 2022-04-25 DIAGNOSIS — Z8616 Personal history of COVID-19: Secondary | ICD-10-CM

## 2022-04-25 DIAGNOSIS — I129 Hypertensive chronic kidney disease with stage 1 through stage 4 chronic kidney disease, or unspecified chronic kidney disease: Secondary | ICD-10-CM | POA: Diagnosis present

## 2022-04-25 DIAGNOSIS — L89310 Pressure ulcer of right buttock, unstageable: Secondary | ICD-10-CM | POA: Diagnosis present

## 2022-04-25 DIAGNOSIS — E1122 Type 2 diabetes mellitus with diabetic chronic kidney disease: Secondary | ICD-10-CM | POA: Diagnosis present

## 2022-04-25 DIAGNOSIS — K649 Unspecified hemorrhoids: Secondary | ICD-10-CM | POA: Insufficient documentation

## 2022-04-25 DIAGNOSIS — E785 Hyperlipidemia, unspecified: Secondary | ICD-10-CM | POA: Diagnosis present

## 2022-04-25 LAB — FOLATE: Folate: 9.9 ng/mL (ref >5.9)

## 2022-04-25 LAB — HEMOGLOBIN A1C
Hgb A1c MFr Bld: 8.5 % — ABNORMAL HIGH (ref 4.8–5.6)
Mean Plasma Glucose: 197.25 mg/dL

## 2022-04-25 LAB — PHOSPHORUS: Phosphorus: 2.9 mg/dL (ref 2.5–4.6)

## 2022-04-25 LAB — BASIC METABOLIC PANEL
Anion gap: 8 (ref 5–15)
BUN: 39 mg/dL — ABNORMAL HIGH (ref 8–23)
CO2: 25 mmol/L (ref 22–32)
Calcium: 8.3 mg/dL — ABNORMAL LOW (ref 8.9–10.3)
Chloride: 109 mmol/L (ref 98–111)
Creatinine, Ser: 1.47 mg/dL — ABNORMAL HIGH (ref 0.44–1.00)
GFR, Estimated: 35 mL/min — ABNORMAL LOW (ref >60)
Glucose, Bld: 66 mg/dL — ABNORMAL LOW (ref 70–99)
Potassium: 3.4 mmol/L — ABNORMAL LOW (ref 3.5–5.1)
Sodium: 142 mmol/L (ref 135–145)

## 2022-04-25 LAB — RETICULOCYTES
Immature Retic Fract: 7.4 % (ref 2.3–15.9)
RBC.: 3.17 MIL/uL — ABNORMAL LOW (ref 3.87–5.11)
Retic Count, Absolute: 32.3 10*3/uL (ref 19.0–186.0)
Retic Ct Pct: 1 % (ref 0.4–3.1)

## 2022-04-25 LAB — IRON AND TIBC
Iron: 38 ug/dL (ref 28–170)
Saturation Ratios: 30 % (ref 10.4–31.8)
TIBC: 126 ug/dL — ABNORMAL LOW (ref 250–450)
UIBC: 88 ug/dL

## 2022-04-25 LAB — D-DIMER, QUANTITATIVE: D-Dimer, Quant: 5.76 ug/mL-FEU — ABNORMAL HIGH (ref 0.00–0.50)

## 2022-04-25 LAB — CBC WITH DIFFERENTIAL/PLATELET
Abs Immature Granulocytes: 0.1 10*3/uL — ABNORMAL HIGH (ref 0.00–0.07)
Abs Immature Granulocytes: 0.09 10*3/uL — ABNORMAL HIGH (ref 0.00–0.07)
Basophils Absolute: 0.1 10*3/uL (ref 0.0–0.1)
Basophils Absolute: 0.1 10*3/uL (ref 0.0–0.1)
Basophils Relative: 0 %
Basophils Relative: 0 %
Eosinophils Absolute: 0.1 10*3/uL (ref 0.0–0.5)
Eosinophils Absolute: 0.1 10*3/uL (ref 0.0–0.5)
Eosinophils Relative: 1 %
Eosinophils Relative: 1 %
HCT: 31.9 % — ABNORMAL LOW (ref 36.0–46.0)
HCT: 26.8 % — ABNORMAL LOW (ref 36.0–46.0)
Hemoglobin: 10 g/dL — ABNORMAL LOW (ref 12.0–15.0)
Hemoglobin: 8.7 g/dL — ABNORMAL LOW (ref 12.0–15.0)
Immature Granulocytes: 1 %
Immature Granulocytes: 1 %
Lymphocytes Relative: 10 %
Lymphocytes Relative: 12 %
Lymphs Abs: 2 10*3/uL (ref 0.7–4.0)
Lymphs Abs: 1.7 10*3/uL (ref 0.7–4.0)
MCH: 27.8 pg (ref 26.0–34.0)
MCH: 28.2 pg (ref 26.0–34.0)
MCHC: 31.3 g/dL (ref 30.0–36.0)
MCHC: 32.5 g/dL (ref 30.0–36.0)
MCV: 88.6 fL (ref 80.0–100.0)
MCV: 87 fL (ref 80.0–100.0)
Monocytes Absolute: 1.6 10*3/uL — ABNORMAL HIGH (ref 0.1–1.0)
Monocytes Absolute: 1.4 10*3/uL — ABNORMAL HIGH (ref 0.1–1.0)
Monocytes Relative: 8 %
Monocytes Relative: 10 %
Neutro Abs: 16 10*3/uL — ABNORMAL HIGH (ref 1.7–7.7)
Neutro Abs: 11.2 10*3/uL — ABNORMAL HIGH (ref 1.7–7.7)
Neutrophils Relative %: 80 %
Neutrophils Relative %: 76 %
Platelets: 261 10*3/uL (ref 150–400)
Platelets: 259 10*3/uL (ref 150–400)
RBC: 3.6 MIL/uL — ABNORMAL LOW (ref 3.87–5.11)
RBC: 3.08 MIL/uL — ABNORMAL LOW (ref 3.87–5.11)
RDW: 13.9 % (ref 11.5–15.5)
RDW: 13.9 % (ref 11.5–15.5)
WBC: 19.8 10*3/uL — ABNORMAL HIGH (ref 4.0–10.5)
WBC: 14.6 10*3/uL — ABNORMAL HIGH (ref 4.0–10.5)
nRBC: 0 % (ref 0.0–0.2)
nRBC: 0 % (ref 0.0–0.2)

## 2022-04-25 LAB — URINALYSIS, ROUTINE W REFLEX MICROSCOPIC
Bilirubin Urine: NEGATIVE
Glucose, UA: NEGATIVE mg/dL
Hgb urine dipstick: NEGATIVE
Ketones, ur: NEGATIVE mg/dL
Leukocytes,Ua: NEGATIVE
Nitrite: NEGATIVE
Protein, ur: NEGATIVE mg/dL
Specific Gravity, Urine: 1.013 (ref 1.005–1.030)
pH: 5 (ref 5.0–8.0)

## 2022-04-25 LAB — EXPECTORATED SPUTUM ASSESSMENT W GRAM STAIN, RFLX TO RESP C

## 2022-04-25 LAB — RESP PANEL BY RT-PCR (FLU A&B, COVID) ARPGX2
Influenza A by PCR: NEGATIVE
Influenza B by PCR: NEGATIVE
SARS Coronavirus 2 by RT PCR: POSITIVE — AB

## 2022-04-25 LAB — BLOOD GAS, VENOUS
Acid-Base Excess: 3.2 mmol/L — ABNORMAL HIGH (ref 0.0–2.0)
Bicarbonate: 28.5 mmol/L — ABNORMAL HIGH (ref 20.0–28.0)
O2 Saturation: 83.3 %
Patient temperature: 37
pCO2, Ven: 45 mmHg (ref 44–60)
pH, Ven: 7.41 (ref 7.25–7.43)
pO2, Ven: 49 mmHg — ABNORMAL HIGH (ref 32–45)

## 2022-04-25 LAB — LACTATE DEHYDROGENASE: LDH: 212 U/L — ABNORMAL HIGH (ref 98–192)

## 2022-04-25 LAB — COMPREHENSIVE METABOLIC PANEL
ALT: 14 U/L (ref 0–44)
AST: 21 U/L (ref 15–41)
Albumin: 2.2 g/dL — ABNORMAL LOW (ref 3.5–5.0)
Alkaline Phosphatase: 87 U/L (ref 38–126)
Anion gap: 9 (ref 5–15)
BUN: 48 mg/dL — ABNORMAL HIGH (ref 8–23)
CO2: 27 mmol/L (ref 22–32)
Calcium: 8.6 mg/dL — ABNORMAL LOW (ref 8.9–10.3)
Chloride: 106 mmol/L (ref 98–111)
Creatinine, Ser: 1.86 mg/dL — ABNORMAL HIGH (ref 0.44–1.00)
GFR, Estimated: 27 mL/min — ABNORMAL LOW (ref >60)
Glucose, Bld: 136 mg/dL — ABNORMAL HIGH (ref 70–99)
Potassium: 3.4 mmol/L — ABNORMAL LOW (ref 3.5–5.1)
Sodium: 142 mmol/L (ref 135–145)
Total Bilirubin: 0.6 mg/dL (ref 0.3–1.2)
Total Protein: 6.4 g/dL — ABNORMAL LOW (ref 6.5–8.1)

## 2022-04-25 LAB — CREATININE, URINE, RANDOM: Creatinine, Urine: 133 mg/dL

## 2022-04-25 LAB — C-REACTIVE PROTEIN: CRP: 19.3 mg/dL — ABNORMAL HIGH (ref <1.0)

## 2022-04-25 LAB — CBG MONITORING, ED
Glucose-Capillary: 63 mg/dL — ABNORMAL LOW (ref 70–99)
Glucose-Capillary: 129 mg/dL — ABNORMAL HIGH (ref 70–99)
Glucose-Capillary: 125 mg/dL — ABNORMAL HIGH (ref 70–99)

## 2022-04-25 LAB — POC OCCULT BLOOD, ED: Fecal Occult Bld: NEGATIVE

## 2022-04-25 LAB — MAGNESIUM: Magnesium: 1.6 mg/dL — ABNORMAL LOW (ref 1.7–2.4)

## 2022-04-25 LAB — SODIUM, URINE, RANDOM: Sodium, Ur: 11 mmol/L

## 2022-04-25 LAB — TROPONIN I (HIGH SENSITIVITY): Troponin I (High Sensitivity): 55 ng/L — ABNORMAL HIGH (ref <18)

## 2022-04-25 LAB — APTT: aPTT: 32 seconds (ref 24–36)

## 2022-04-25 LAB — TSH: TSH: 2.912 u[IU]/mL (ref 0.350–4.500)

## 2022-04-25 LAB — LACTIC ACID, PLASMA: Lactic Acid, Venous: 1 mmol/L (ref 0.5–1.9)

## 2022-04-25 LAB — STREP PNEUMONIAE URINARY ANTIGEN: Strep Pneumo Urinary Antigen: NEGATIVE

## 2022-04-25 LAB — BRAIN NATRIURETIC PEPTIDE: B Natriuretic Peptide: 553.5 pg/mL — ABNORMAL HIGH (ref 0.0–100.0)

## 2022-04-25 LAB — VITAMIN B12: Vitamin B-12: 447 pg/mL (ref 180–914)

## 2022-04-25 LAB — FERRITIN: Ferritin: 673 ng/mL — ABNORMAL HIGH (ref 11–307)

## 2022-04-25 LAB — OSMOLALITY, URINE: Osmolality, Ur: 448 mOsm/kg (ref 300–900)

## 2022-04-25 LAB — PROCALCITONIN: Procalcitonin: 21.84 ng/mL

## 2022-04-25 LAB — PROTIME-INR
INR: 1.2 (ref 0.8–1.2)
Prothrombin Time: 15.4 seconds — ABNORMAL HIGH (ref 11.4–15.2)

## 2022-04-25 LAB — CK: Total CK: 88 U/L (ref 38–234)

## 2022-04-25 MED ORDER — GUAIFENESIN-DM 100-10 MG/5ML PO SYRP
10.0000 mL | ORAL_SOLUTION | ORAL | Status: DC | PRN
  Administered 2022-04-26: 10 mL via ORAL
  Filled 2022-04-25: qty 10

## 2022-04-25 MED ORDER — CEFTRIAXONE SODIUM 2 G IJ SOLR
2.0000 g | Freq: Once | INTRAMUSCULAR | Status: AC
  Administered 2022-04-25: 2 g via INTRAVENOUS
  Filled 2022-04-25: qty 20

## 2022-04-25 MED ORDER — HYDROCOD POLI-CHLORPHE POLI ER 10-8 MG/5ML PO SUER: 5.0000 mL | Freq: Two times a day (BID) | ORAL | Status: DC | PRN

## 2022-04-25 MED ORDER — LACTATED RINGERS IV SOLN: INTRAVENOUS | Status: DC

## 2022-04-25 MED ORDER — INSULIN GLARGINE-YFGN 100 UNIT/ML ~~LOC~~ SOLN: 5.0000 [IU] | Freq: Every day | SUBCUTANEOUS | Status: DC

## 2022-04-25 MED ORDER — SODIUM CHLORIDE 0.9 % IV SOLN
2.0000 g | INTRAVENOUS | Status: DC
  Administered 2022-04-25: 2 g via INTRAVENOUS
  Filled 2022-04-25: qty 20

## 2022-04-25 MED ORDER — INSULIN ASPART 100 UNIT/ML IJ SOLN
0.0000 [IU] | INTRAMUSCULAR | Status: DC
  Filled 2022-04-25: qty 0.09

## 2022-04-25 MED ORDER — SODIUM CHLORIDE 0.9 % IV SOLN
500.0000 mg | INTRAVENOUS | Status: DC
  Administered 2022-04-26: 500 mg via INTRAVENOUS
  Filled 2022-04-25 (×2): qty 5

## 2022-04-25 MED ORDER — LACTATED RINGERS IV BOLUS (SEPSIS)
1000.0000 mL | Freq: Once | INTRAVENOUS | Status: AC
  Administered 2022-04-25: 1000 mL via INTRAVENOUS

## 2022-04-25 MED ORDER — SODIUM CHLORIDE 0.9 % IV SOLN
500.0000 mg | Freq: Once | INTRAVENOUS | Status: AC
  Administered 2022-04-25: 500 mg via INTRAVENOUS
  Filled 2022-04-25: qty 5

## 2022-04-25 MED ORDER — ALBUTEROL SULFATE HFA 108 (90 BASE) MCG/ACT IN AERS
2.0000 | INHALATION_SPRAY | Freq: Three times a day (TID) | RESPIRATORY_TRACT | Status: DC
  Administered 2022-04-26 (×2): 2 via RESPIRATORY_TRACT
  Filled 2022-04-25 (×2): qty 6.7

## 2022-04-25 MED ORDER — CEFTRIAXONE SODIUM 1 G IJ SOLR: 1.0000 g | Freq: Once | INTRAMUSCULAR | Status: DC

## 2022-04-25 MED ORDER — ALBUTEROL SULFATE HFA 108 (90 BASE) MCG/ACT IN AERS
2.0000 | INHALATION_SPRAY | Freq: Four times a day (QID) | RESPIRATORY_TRACT | Status: DC
  Administered 2022-04-25: 2 via RESPIRATORY_TRACT
  Filled 2022-04-25: qty 6.7

## 2022-04-25 MED ORDER — SODIUM CHLORIDE 0.9 % IV SOLN
2.0000 g | INTRAVENOUS | Status: AC
  Administered 2022-04-26 – 2022-04-30 (×5): 2 g via INTRAVENOUS
  Filled 2022-04-25 (×5): qty 20

## 2022-04-25 NOTE — Assessment & Plan Note (Addendum)
-   hold Lantus   -  check TSH and HgA1C  - Hold by mouth medications  cbg q 2h Diabetes coordinator consult

## 2022-04-25 NOTE — Assessment & Plan Note (Signed)
Obtain urine electrolytes gently rehydrate hold hydrochlorothiazide

## 2022-04-25 NOTE — Assessment & Plan Note (Signed)
Monitor for any sign of sundowning continue Seroquel and Valium as needed to prevent withdrawal

## 2022-04-25 NOTE — ED Triage Notes (Signed)
Pt BIB EMS from [facility], c/o rectal and vaginal bleeding. Staff reported bleeding started today. Hx of dementia, denied pain until transported to EMS stretcher.   BP 110/58 P 74 RR 16 spO2 93%

## 2022-04-25 NOTE — Assessment & Plan Note (Signed)
-   will replace and repeat in AM,  check magnesium level and replace as needed ° °

## 2022-04-25 NOTE — Progress Notes (Signed)
Pt unable to follow directions to appropriately use flutter device.

## 2022-04-25 NOTE — ED Notes (Signed)
Patient cleaned and adult diaper changed. In and out cath done, and urine specimen collected and sent to lab. JRPRN

## 2022-04-25 NOTE — ED Provider Notes (Signed)
Gail Lynch Provider Note   CSN: [id number] Arrival date & time: 04/25/22  1117     History {Add pertinent medical, surgical, social history, OB history to HPI:1} Chief Complaint  Patient presents with   Rectal Bleeding    Gail Lynch is a 81 y.o. female.  HPI     81 year old female comes in with chief complaint of rectal bleeding. Patient has history of hypertension, dementia, diabetes, CKD.  She resides at a nursing home and has baseline dementia.  She was sent to the emergency room by [facility] because of rectal or vaginal bleeding.  Patient unable to provide meaningful history.  I called the nursing home, they indicated that patient's depends were bloody this morning, and the medical staff decided to send her into the ER for further evaluation.  Patient otherwise at baseline normal.  She was diagnosed with COVID-19 over the weekend and is on Paxlovid.  Home Medications Prior to Admission medications   Medication Sig Start Date End Date Taking? Authorizing Provider  acetaminophen (TYLENOL) 500 MG tablet Take 500-1,000 mg by mouth every 6 (six) hours as needed for mild pain or headache.    [provider]  allopurinol (ZYLOPRIM) 300 MG tablet Take 1 tablet (300 mg total) by mouth 2 (two) times daily. Patient taking differently: Take 300 mg by mouth as needed. 02/04/21   Biagio Borg, MD  allopurinol (ZYLOPRIM) 300 MG tablet Take 1 tablet (300 mg total) by mouth 2 (two) times daily for 14 days. 07/07/21 07/21/21  Jeanell Sparrow, DO  diazepam (VALIUM) 5 MG tablet Take 1 tablet (5 mg total) by mouth every 6 (six) hours as needed for anxiety. 03/15/21   Biagio Borg, MD  diazepam (VALIUM) 5 MG tablet Take 1 tablet (5 mg total) by mouth every 6 (six) hours as needed for up to 2 doses for anxiety. 07/07/21   Wynona Dove A, DO  glucose blood (ONETOUCH VERIO) test strip 1 each by Other route 2 (two) times daily. And lancets  2/day 08/03/20   Renato Shin, MD  hydrALAZINE (APRESOLINE) 25 MG tablet Take 1 tablet (25 mg total) by mouth every 8 (eight) hours. 02/04/21   Biagio Borg, MD  hydrALAZINE (APRESOLINE) 25 MG tablet Take 1 tablet (25 mg total) by mouth 3 (three) times daily for 14 days. 07/07/21 07/21/21  Jeanell Sparrow, DO  hydrochlorothiazide (HYDRODIURIL) 12.5 MG tablet Take 2 tablets (25 mg total) by mouth daily for 14 days. 07/07/21 07/21/21  Jeanell Sparrow, DO  hydrochlorothiazide (MICROZIDE) 12.5 MG capsule Take 1 capsule (12.5 mg total) by mouth daily. 02/04/21 02/04/22  Biagio Borg, MD  Insulin NPH, Human,, Isophane, (HUMULIN N KWIKPEN) 100 UNIT/ML Kiwkpen Inject 50 Units into the skin every morning. 08/03/20   Renato Shin, MD  Lancets (ONETOUCH DELICA PLUS [id number]) [place]le Test bid 12/28/20   [provider]  LANTUS SOLOSTAR 100 UNIT/ML Solostar Pen SMARTSIG:10 Unit(s) SUB-Q Daily 09/05/21   [provider]  loperamide (IMODIUM) 2 MG capsule Take by mouth as needed for diarrhea or loose stools.    [provider]  magnesium hydroxide (MILK OF MAGNESIA) 400 MG/5ML suspension Take by mouth as needed for mild constipation.    [provider]  metFORMIN (GLUCOPHAGE) 500 MG tablet Take 1 tablet (500 mg total) by mouth 2 (two) times daily with a meal for 14 days. 07/07/21 07/21/21  Wynona Dove A, DO  QUEtiapine (SEROQUEL) 25 MG tablet Take  1 tablet (25 mg total) by mouth at bedtime for 14 days. 07/07/21 07/21/21  Jeanell Sparrow, DO  risperiDONE (RISPERDAL) 1 MG/ML oral solution Take 2 mg by mouth 2 (two) times daily. 10/25/21   [provider]  rosuvastatin (CRESTOR) 40 MG tablet Take 1 tablet (40 mg total) by mouth daily. 02/04/21   Biagio Borg, MD  rosuvastatin (CRESTOR) 40 MG tablet Take 1 tablet (40 mg total) by mouth daily for 14 days. 07/07/21 07/21/21  Jeanell Sparrow, DO      Allergies    Aspirin and Penicillins    Review of Systems   Review of  Systems  Physical Exam Updated Vital Signs BP (!) 163/71 (BP Location: Left Arm)   Pulse 65   Temp (!) 97.3 F (36.3 C) (Oral)   Resp 18   SpO2 99%  Physical Exam Vitals and nursing note reviewed.  Constitutional:      Appearance: She is well-developed.  HENT:     Head: Atraumatic.  Cardiovascular:     Rate and Rhythm: Normal rate.  Pulmonary:     Effort: Pulmonary effort is normal.  Abdominal:     Tenderness: There is no abdominal tenderness.  Genitourinary:    Comments: On gross exam, there is no blood over the vulvovaginal region. Patient has multiple hemorrhoids, none actively bleeding. Digital rectal exam revealed no gross blood or melena Musculoskeletal:     Cervical back: Normal range of motion and neck supple.  Skin:    General: Skin is warm and dry.  Neurological:     Mental Status: She is alert and oriented to person, place, and time.     ED Results / Procedures / Treatments   Labs (all labs ordered are listed, but only abnormal results are displayed) Labs Reviewed  COMPREHENSIVE METABOLIC PANEL - Abnormal; Notable for the following components:      Result Value   Potassium 3.4 (*)    Glucose, Bld 136 (*)    BUN 48 (*)    Creatinine, Ser 1.86 (*)    Calcium 8.6 (*)    Total Protein 6.4 (*)    Albumin 2.2 (*)    GFR, Estimated 27 (*)    All other components within normal limits  CBC WITH DIFFERENTIAL/PLATELET - Abnormal; Notable for the following components:   WBC 19.8 (*)    RBC 3.60 (*)    Hemoglobin 10.0 (*)    HCT 31.9 (*)    Neutro Abs 16.0 (*)    Monocytes Absolute 1.6 (*)    Abs Immature Granulocytes 0.10 (*)    All other components within normal limits  PROTIME-INR - Abnormal; Notable for the following components:   Prothrombin Time 15.4 (*)    All other components within normal limits  RESP PANEL BY RT-PCR (FLU A&B, COVID) ARPGX2  CULTURE, BLOOD (ROUTINE X 2)  CULTURE, BLOOD (ROUTINE X 2)  URINE CULTURE  URINALYSIS, ROUTINE W REFLEX  MICROSCOPIC  LACTIC ACID, PLASMA  APTT  LACTIC ACID, PLASMA  POC OCCULT BLOOD, ED    EKG EKG Interpretation  Date/Time:  Tuesday April 25 2022 14:29:11 EDT Ventricular Rate:  62 PR Interval:  206 QRS Duration: 172 QT Interval:  459 QTC Calculation: 467 R Axis:   -59 Text Interpretation: Sinus rhythm Atrial premature complex Nonspecific IVCD with LAD LVH with secondary repolarization abnormality Inferior infarct, recent Anterior Q waves, possibly due to LVH new ivcd Confirmed by Varney Biles [phone]) on 04/25/2022 3:56:26 PM  Radiology DG  Chest Port 1 View  Result Date: 04/25/2022 CLINICAL DATA:  Questionable sepsis, rectal and vaginal bleeding EXAM: PORTABLE CHEST 1 VIEW COMPARISON:  04/16/2022 FINDINGS: Cardiomegaly with left chest multi lead pacer. Mild, diffuse bilateral interstitial pulmonary opacity. The visualized skeletal structures are unremarkable. IMPRESSION: Cardiomegaly with mild, diffuse bilateral interstitial pulmonary opacity, likely edema. No focal airspace opacity. Electronically Signed   By: Delanna Ahmadi M.D.   On: 04/25/2022 15:28    Procedures Procedures  {Document cardiac monitor, telemetry assessment procedure when appropriate:1}  Medications Ordered in ED Medications  lactated ringers infusion ( Intravenous New Bag/Given 04/25/22 1501)  cefTRIAXone (ROCEPHIN) 2 g in sodium chloride 0.9 % 100 mL IVPB (0 g Intravenous Stopped 04/25/22 1522)  lactated ringers bolus 1,000 mL (0 mLs Intravenous Stopped 04/25/22 1522)    ED Course/ Medical Decision Making/ A&P Clinical Course as of 04/25/22 [place] Apr 25, 2022  1658 COVID (+) [MK]  1658 Pending scans, if normal home [MK]    Clinical Course User Index [MK] Kommor, Debe Coder, MD                           Medical Decision Making Amount and/or Complexity of Data Reviewed Labs: ordered. Radiology: ordered. ECG/medicine tests: ordered.  Risk Prescription drug management.  This patient presents to the ED  with chief complaint(s) of rectal or vaginal bleeding with pertinent past medical history of CKD, dementia and patient currently has COVID-19.The complaint involves an extensive differential diagnosis and also carries with it a high risk of complications and morbidity.    Patient's gross exam is reassuring.  She has no active bleeding appreciated.  Digital rectal exam is also reassuring.  The differential diagnosis includes bleeding secondary to colitis, hemorrhoids, diverticulosis, tumor.  Patient has no abdominal tenderness.  The initial plan is to get basic labs and reassess the patient.  Reassessment: Patient's lab reveals elevated white count.  Urine analysis is normal.  No blood in the urine. Given the elevated white count, we will get CT abdomen pelvis without contrast, but if that is negative then we will allow for outpatient management.  Patient resides in a nursing home and they can monitor her closely.  Patient has not had any bleeding here.   Additional history obtained: Additional history obtained from nursing home/care facility Records reviewed previous admission documents  Independent labs interpretation:  The following labs were independently interpreted: White count of 19, hemoglobin is 10.  Week ago it was 12.  Prior to that, hemoglobin was 10.4.  Independent visualization and interpretation of imaging: - I independently visualized the following imaging with scope of interpretation limited to determining acute life threatening conditions related to emergency care: X-ray of the chest, which revealed no focal consolidation  Treatment and Reassessment: UA is normal.  CT scan results pending.  Incoming team will follow-up on the results.  Patient is stable for discharge  Admission to the hospital considered.  However if patient's CT scan is reassuring, she does not have repeat bleeding here then we will advise outpatient management at the nursing home and for her to return  to the ER if her symptoms get worse.   Final Clinical Impression(s) / ED Diagnoses Final diagnoses:  None    Rx / DC Orders ED Discharge Orders     None

## 2022-04-25 NOTE — Subjective & Objective (Signed)
Patient brought from [place] with reported blood noted in depends.  Staff was not sure where the blood was coming from but noticed it only today patient has history of dementia at baseline unable to provide her own history. On arrival vitals blood pressure 110/58 pulse 74 respirations 16 satting 93% on room air. Patient have tested COVID-positive on 27 August finished Paxlovid but has continued to have cough

## 2022-04-25 NOTE — Assessment & Plan Note (Signed)
Patient has already received fax with.  Suspect current chest x-ray abnormality secondary to postviral community-acquired pneumonia. Check procalcitonin.  Continue with antibiotics. Supportive management regarding COVID.  Given patient continues to be symptomatic we will continue airborne precautions for now Obtain inflammatory markers

## 2022-04-25 NOTE — ED Notes (Signed)
MD notified of BS 85. Orange juice and sandwich given. Will recheck at 21:30. JRPRN

## 2022-04-25 NOTE — Assessment & Plan Note (Signed)
Order wound care consult

## 2022-04-25 NOTE — ED Provider Notes (Signed)
  Physical Exam  BP 95/77   Pulse 75   Temp 97.9 F (36.6 C)   Resp 12   SpO2 97%   Physical Exam Vitals and nursing note reviewed.  Constitutional:      General: She is not in acute distress.    Appearance: She is well-developed.  HENT:     Head: Normocephalic and atraumatic.  Eyes:     Conjunctiva/sclera: Conjunctivae normal.  Cardiovascular:     Rate and Rhythm: Normal rate and regular rhythm.     Heart sounds: No murmur heard. Pulmonary:     Effort: Pulmonary effort is normal. No respiratory distress.     Breath sounds: Normal breath sounds.  Abdominal:     Palpations: Abdomen is soft.     Tenderness: There is no abdominal tenderness.  Musculoskeletal:        General: No swelling.     Cervical back: Neck supple.  Skin:    General: Skin is warm and dry.     Capillary Refill: Capillary refill takes less than 2 seconds.  Neurological:     Mental Status: She is alert.  Psychiatric:        Mood and Affect: Mood normal.     Procedures  Procedures  ED Course / MDM   Clinical Course as of 04/25/22 2325  Tue Apr 25, 2022  1658 [place] (+) [MK]  1658 Pending scans, if normal home [MK]    Clinical Course User Index [MK] Kailany Dinunzio, Debe Coder, MD   Medical Decision Making Amount and/or Complexity of Data Reviewed Labs: ordered. Radiology: ordered. ECG/medicine tests: ordered.  Risk Prescription drug management. Decision regarding hospitalization.   Patient received in handoff.  Pending CTAP at time of signout.  CT concerning for consolidative pneumonia.  Likely bacterial superinfection after old COVID-19 infection.  Patient started on ceftriaxone azithromycin and admitted as the patient's curb 65 score is 3.       Teressa Lower, MD 04/25/22 2325

## 2022-04-25 NOTE — Assessment & Plan Note (Signed)
-  chronic avoid nephrotoxic medications such as NSAIDs, Vanco Zosyn combo,  avoid hypotension, continue to follow renal function  

## 2022-04-25 NOTE — Discharge Instructions (Addendum)
Gail Lynch was seen in the emergency room for bleeding.  Digital rectal exam did not reveal any blood.  Urine does not show any blood.  Hemoglobin is stable at above 10.  Please monitor her output closely.  If you notice frank bloody stool, bloody urine, vaginal bleeding, dark tarry stools please return her to the emergency room.

## 2022-04-25 NOTE — ED Notes (Signed)
FSBS 125

## 2022-04-25 NOTE — Assessment & Plan Note (Signed)
obtain anemia panel in am Blood in diaper was due to decub ulcer

## 2022-04-25 NOTE — Assessment & Plan Note (Signed)
Status post pacemaker 

## 2022-04-25 NOTE — Sepsis Progress Note (Signed)
Sepsis protocol monitored by eLink 

## 2022-04-25 NOTE — H&P (Signed)
Gail Lynch IRW:[id number] DOB: 1941-02-02 DOA: 04/25/2022     PCP: Merryl Hacker, No   Outpatient Specialists:   CARDS:  Baldwin [place], PA     Patient arrived to ER on 04/25/22 at 1117 Referred by Attending Kommor, Debe Coder, MD   Patient coming from:    home Lives alone,     From facility [facility] [place] [facility]  Chief Complaint: Blood in depends Chief Complaint  Patient presents with   Rectal Bleeding    HPI: Gail Lynch is a 81 y.o. female with medical history significant of dementia hypertension hyperlipidemia CKD, gout, complete AV block status post pacemaker Carotid stenosis, HOH, glaucoma  Presented with blood in depends Patient brought from [place] with reported blood noted in depends.  Staff was not sure where the blood was coming from but noticed it only today patient has history of dementia at baseline unable to provide her own history. On arrival vitals blood pressure 110/58 pulse 74 respirations 16 satting 93% on room air. Patient have tested COVID-positive on 27 August finished Paxlovid but has continued to have cough     Initial COVID TEST    POSITIVE,     Lab Results  Component Value Date   [place] (A) 04/25/2022   [place] NEGATIVE 06/30/2021   [place] NEGATIVE 01/21/2021   [place] NEGATIVE 05/04/2020     Regarding pertinent Chronic problems:     Hyperlipidemia -  on statins Crestor Lipid Panel     Component Value Date/Time   CHOL 222 (H) 07/29/2020 1041   TRIG 145.0 07/29/2020 1041   HDL 48.20 07/29/2020 1041   CHOLHDL 5 07/29/2020 1041   VLDL 29.0 07/29/2020 1041   LDLCALC 145 (H) 07/29/2020 1041   LDLDIRECT 152.9 12/18/2011 1405     HTN on hydralazine and hydrochlorothiazide Last echo in 2021 showed preserved EF      DM 2 -  Lab Results  Component Value Date   HGBA1C 9.2 (H) 01/21/2021   on insulin, PO meds          Asthma -well   controlled on home inhalers/         CKD stage IIIb- baseline Cr  1.5 CrCl cannot be calculated (Unknown ideal weight.).  Lab Results  Component Value Date   CREATININE 1.86 (H) 04/25/2022   CREATININE 1.50 (H) 04/16/2022   CREATININE 1.35 (H) 11/12/2021         Dementia - on Seroquel    Chronic anemia - baseline hg Hemoglobin & Hematocrit  Recent Labs    11/12/21 1654 04/16/22 1218 04/25/22 1145  HGB 10.4* 12.4 10.0*     While in ER: Clinical Course as of 04/25/22 1856  Tue Apr 25, 2022  1658 [place] (+) [MK]  1658 Pending scans, if normal home [MK]    Clinical Course User Index [MK] Kommor, [place], MD    Chest x-ray showed evidence of diffuse bilateral infiltrates patient started on Rocephin azithromycin plan to admit for community-acquired pneumonia treatment Patient has already been receiving Paxlovid.  Noted to have elevated white blood cell count worrisome for postviral pneumonia.      CXR - Cardiomegaly with mild, diffuse bilateral interstitial pulmonary opacity, likely edema  CTabd/pelvis - BILATERAL perinephric stranding, recommend correlation with urinalysis to exclude urinary tract infection.   BILATERAL lower lobe and RIGHT middle lobe consolidation consistent with pneumonia.    Following Medications were ordered in ER: Medications  lactated ringers infusion ( Intravenous New Bag/Given 04/25/22 1501)  albuterol (  VENTOLIN HFA) 108 (90 Base) MCG/ACT inhaler 2 puff (has no administration in time range)  guaiFENesin-dextromethorphan (ROBITUSSIN DM) 100-10 MG/5ML syrup 10 mL (has no administration in time range)  chlorpheniramine-HYDROcodone (TUSSIONEX) 10-8 MG/5ML suspension 5 mL (has no administration in time range)  cefTRIAXone (ROCEPHIN) 2 g in sodium chloride 0.9 % 100 mL IVPB (has no administration in time range)  azithromycin (ZITHROMAX) 500 mg in sodium chloride 0.9 % 250 mL IVPB (has no administration in time range)  insulin aspart (novoLOG) injection 0-9 Units (has no administration in time range)  insulin  glargine-yfgn (SEMGLEE) injection 5 Units (has no administration in time range)  lactated ringers bolus 1,000 mL (0 mLs Intravenous Stopped 04/25/22 1522)  azithromycin (ZITHROMAX) 500 mg in sodium chloride 0.9 % 250 mL IVPB (500 mg Intravenous New Bag/Given 04/25/22 1737)  cefTRIAXone (ROCEPHIN) 2 g in sodium chloride 0.9 % 100 mL IVPB (2 g Intravenous New Bag/Given 04/25/22 1811)       ED Triage Vitals [04/25/22 1138]  Enc Vitals Group     BP (!) 155/111     Pulse Rate 65     Resp 18     Temp 97.7 F (36.5 C)     Temp Source Oral     SpO2 98 %     Weight      Height      Head Circumference      Peak Flow      Pain Score      Pain Loc      Pain Edu?      Excl. in [place]?   QIWL(79)@     _________________________________________ Significant initial  Findings: Abnormal Labs Reviewed  RESP PANEL BY RT-PCR (FLU A&B, COVID) ARPGX2 - Abnormal; Notable for the following components:      Result Value   SARS Coronavirus 2 by RT PCR POSITIVE (*)    All other components within normal limits  COMPREHENSIVE METABOLIC PANEL - Abnormal; Notable for the following components:   Potassium 3.4 (*)    Glucose, Bld 136 (*)    BUN 48 (*)    Creatinine, Ser 1.86 (*)    Calcium 8.6 (*)    Total Protein 6.4 (*)    Albumin 2.2 (*)    GFR, Estimated 27 (*)    All other components within normal limits  CBC WITH DIFFERENTIAL/PLATELET - Abnormal; Notable for the following components:   WBC 19.8 (*)    RBC 3.60 (*)    Hemoglobin 10.0 (*)    HCT 31.9 (*)    Neutro Abs 16.0 (*)    Monocytes Absolute 1.6 (*)    Abs Immature Granulocytes 0.10 (*)    All other components within normal limits  PROTIME-INR - Abnormal; Notable for the following components:   Prothrombin Time 15.4 (*)    All other components within normal limits     _________________________ Troponin   ordered ECG: Ordered Personally reviewed and interpreted by me showing: HR : 62 Rhythm: Sinus rhythm Atrial premature  complex Nonspecific IVCD with LAD LVH with secondary repolarization abnormality Inferior infarct, recent QTC 467     The recent clinical data is shown below. Vitals:   04/25/22 1552 04/25/22 1630 04/25/22 1735 04/25/22 1839  BP: (!) 163/71 (!) 154/66 (!) 144/58 132/67  Pulse: 65 63 64 70  Resp: '18 15 17 16  '$ Temp: (!) 97.3 F (36.3 C)   97.6 F (36.4 C)  TempSrc: Oral   Oral  SpO2: 99% 96% 99% 98%  WBC     Component Value Date/Time   WBC 19.8 (H) 04/25/2022 1145   LYMPHSABS 2.0 04/25/2022 1145   MONOABS 1.6 (H) 04/25/2022 1145   EOSABS 0.1 04/25/2022 1145   BASOSABS 0.1 04/25/2022 1145     Lactic Acid, Venous    Component Value Date/Time   LATICACIDVEN 1.0 04/25/2022 1358    Procalcitonin        UA   no evidence of UTI      Urine analysis:    Component Value Date/Time   COLORURINE YELLOW 04/25/2022 [place] 04/25/2022 1438   LABSPEC 1.013 04/25/2022 1438   PHURINE 5.0 04/25/2022 1438   GLUCOSEU NEGATIVE 04/25/2022 1438   GLUCOSEU 100 (A) 07/29/2020 1041   HGBUR NEGATIVE 04/25/2022 1438   [place] 04/25/2022 1438   KETONESUR NEGATIVE 04/25/2022 1438   PROTEINUR NEGATIVE 04/25/2022 1438   UROBILINOGEN 0.2 07/29/2020 1041   NITRITE NEGATIVE 04/25/2022 [place] 04/25/2022 1438    Results for orders placed or performed during the hospital encounter of 04/25/22  Resp Panel by RT-PCR (Flu A&B, Covid) Urine, Catheterized     Status: Abnormal   Collection Time: 04/25/22  1:58 PM   Specimen: Urine, Catheterized; Nasal Swab  Result Value Ref Range Status   SARS Coronavirus 2 by RT PCR POSITIVE (A) NEGATIVE Final         Influenza A by PCR NEGATIVE NEGATIVE Final   Influenza B by PCR NEGATIVE NEGATIVE Final          _______________________________________________ Hospitalist was called for admission for postviral pneumonia   The following Work up has been ordered so far:  Orders Placed This Encounter   Procedures   Resp Panel by RT-PCR (Flu A&B, Covid) Anterior Nasal Swab   Blood Culture (routine x 2)   Urine Culture   Culture, blood (Routine X 2) w Reflex to ID Panel   Expectorated Sputum Assessment w Gram Stain, Rflx to Resp Cult   DG Chest Port 1 View   CT ABDOMEN PELVIS WO CONTRAST   Comprehensive metabolic panel   CBC with Differential   Protime-INR   Urinalysis, Routine w reflex microscopic   Lactic acid, plasma   APTT   C-reactive protein   CBC with Differential/Platelet   D-dimer, quantitative   Ferritin   Lactate dehydrogenase   Procalcitonin   CBC with Differential/Platelet   Comprehensive metabolic panel   C-reactive protein   D-dimer, quantitative   Ferritin   Magnesium   Phosphorus   Basic metabolic panel   Blood gas, venous   CK   Phosphorus   Magnesium   Osmolality, urine   Osmolality   Creatinine, urine, random   Prealbumin   Sodium, urine, random   TSH   Urinalysis, Complete w Microscopic Urine, Clean Catch   Legionella Pneumophila Serogp 1 Ur Ag   Strep pneumoniae urinary antigen   Hemoglobin A1c   Vitamin B12   Folate   Iron and TIBC   Reticulocytes   Diet NPO time specified   In and Out Cath   Cardiac monitoring   Document height and weight   Assess and Document Glasgow Coma Scale   Document vital signs within 1-hour of fluid bolus completion. Notify provider of abnormal vital signs despite fluid resuscitation.   DO NOT delay antibiotics if unable to obtain blood culture.   Refer to Sidebar Report: Sepsis Sidebar ED/IP   Notify provider for difficulties obtaining IV access.   Insert  peripheral IV x 2   Initiate Carrier Fluid Protocol   Apply Covid-19 Care Plan   Novel Coronavirus PPE supplies (droplet and contact precautions) yellow stethoscopes, surgical mask, gowns, surgical caps, face shield, goggles, CAPR - on the floor/unit, cleaning Sani-Cloth (orange and purple top)   Place COVID-19 isolation sign and PPE checklist outside the  DOOR   Do not give nonsteroidal anti-inflammatory drugs (NSAIDs)   Patient to wear surgical mask during transportation   Place working phone next to the patient   Initiate Oral Care Protocol   Initiate Carrier Fluid Protocol   Cardiac Monitoring - Continuous Indefinite   Apply Diabetes Mellitus Care Plan   STAT CBG when hypoglycemia is suspected. If treated, recheck every 15 minutes after each treatment until CBG >/= 70 mg/dl   Refer to Hypoglycemia Protocol Sidebar Report for treatment of CBG < 70 mg/dl   Code Sepsis activation.  This occurs automatically when order is signed and prioritizes pharmacy, lab, and radiology services for STAT collections and interventions.  If [facility] downtime, call [facility] [phone]) to activate Code Sepsis.   pharmacy consult   Consult to hospitalist   Consult to wound, ostomy, continence   Airborne and Contact precautions   Pulse oximetry, continuous   Incentive spirometry   Flutter valve   POC occult blood, ED   ED EKG 12-Lead   EKG 12-Lead   EKG 12-Lead   Insert saline lock     OTHER Significant initial  Findings:  labs showing:    Recent Labs  Lab 04/25/22 1145  NA 142  K 3.4*  CO2 27  GLUCOSE 136*  BUN 48*  CREATININE 1.86*  CALCIUM 8.6*    Cr   ,  Up from baseline see below Lab Results  Component Value Date   CREATININE 1.86 (H) 04/25/2022   CREATININE 1.50 (H) 04/16/2022   CREATININE 1.35 (H) 11/12/2021    Recent Labs  Lab 04/25/22 1145  AST 21  ALT 14  ALKPHOS 87  BILITOT 0.6  PROT 6.4*  ALBUMIN 2.2*   Lab Results  Component Value Date   CALCIUM 8.6 (L) 04/25/2022          Plt: Lab Results  Component Value Date   PLT 261 04/25/2022       COVID-19 Labs  Recent Labs    04/25/22 1145  DDIMER 5.76*  FERRITIN 673*    Lab Results  Component Value Date   SARSCOV2NAA POSITIVE (A) 04/25/2022   SARSCOV2NAA NEGATIVE 06/30/2021   [place] NEGATIVE 01/21/2021   [place] NEGATIVE 05/04/2020     Venous  Blood Gas result:  pH   7.41 Acid-Base Excess 3.2 High  mmol/L  pCO2, Ven 45 mmHg O2 Saturation 83.3 %  pO2, Ven 49 High  mmHg           Recent Labs  Lab 04/25/22 1145  WBC 19.8*  NEUTROABS 16.0*  HGB 10.0*  HCT 31.9*  MCV 88.6  PLT 261    HG/HCT Down     Component Value Date/Time   HGB 10.0 (L) 04/25/2022 1145   HCT 31.9 (L) 04/25/2022 1145   MCV 88.6 04/25/2022 1145       DM  labs:  HbA1C: No results for input(s): "HGBA1C" in the last 8760 hours.     CBG (last 3)  Recent Labs    04/25/22 2024  GLUCAP 63*          Cultures: No results found for: "SDES", "SPECREQUEST", "CULT", "REPTSTATUS"   Radiological  Exams on Admission: CT ABDOMEN PELVIS WO CONTRAST  Result Date: 04/25/2022 CLINICAL DATA:  Rectal versus vaginal bleeding, leukocytosis, dementia EXAM: CT ABDOMEN AND PELVIS WITHOUT CONTRAST TECHNIQUE: Multidetector CT imaging of the abdomen and pelvis was performed following the standard protocol without IV contrast. RADIATION DOSE REDUCTION: This exam was performed according to the departmental dose-optimization program which includes automated exposure control, adjustment of the mA and/or kV according to patient size and/or use of iterative reconstruction technique. COMPARISON:  03/04/2014 FINDINGS: Lower chest: BILATERAL lower lobe consolidation with additional infiltrate in RIGHT middle lobe. Central peribronchial thickening. Pacemaker leads RIGHT atrium and RIGHT ventricle. Mitral annular calcification. Hepatobiliary: Cholelithiasis.  Liver unremarkable. Pancreas: Atrophic pancreas without mass Spleen: Normal appearance Adrenals/Urinary Tract: Adrenal thickening without mass. Kidneys normal appearance though minimal perinephric stranding is present bilaterally. No ureteral calcification or dilatation. Bladder unremarkable. Stomach/Bowel: Normal appendix. Minimal sigmoid diverticulosis. No CT evidence of diverticulitis. Stomach and remaining bowel loops  unremarkable. Vascular/Lymphatic: Atherosclerotic calcifications aorta, iliac arteries, femoral arteries. Aorta normal caliber. No adenopathy. Reproductive: Uterus and ovaries grossly unremarkable. Other: No free air or free fluid.  No hernia. Musculoskeletal: Bones demineralized. Mild degenerative disc and facet disease changes lumbar spine. Old appearing superior endplate height loss of T12 unchanged. IMPRESSION: Minimal sigmoid diverticulosis without evidence of diverticulitis. Cholelithiasis. Mild BILATERAL perinephric stranding, recommend correlation with urinalysis to exclude urinary tract infection. BILATERAL lower lobe and RIGHT middle lobe consolidation consistent with pneumonia. Aortic Atherosclerosis (ICD10-I70.0). Electronically Signed   By: Lavonia Dana M.D.   On: 04/25/2022 17:19   DG Chest Port 1 View  Result Date: 04/25/2022 CLINICAL DATA:  Questionable sepsis, rectal and vaginal bleeding EXAM: PORTABLE CHEST 1 VIEW COMPARISON:  04/16/2022 FINDINGS: Cardiomegaly with left chest multi lead pacer. Mild, diffuse bilateral interstitial pulmonary opacity. The visualized skeletal structures are unremarkable. IMPRESSION: Cardiomegaly with mild, diffuse bilateral interstitial pulmonary opacity, likely edema. No focal airspace opacity. Electronically Signed   By: Delanna Ahmadi M.D.   On: 04/25/2022 15:28   _______________________________________________________________________________________________________ Latest  Blood pressure 132/67, pulse 70, temperature 97.6 F (36.4 C), temperature source Oral, resp. rate 16, SpO2 98 %.   Vitals  labs and radiology finding personally reviewed  Review of Systems:    Pertinent positives include:  chills, fatigue, shortness of breath at rest.productive cough,  Constitutional:  No weight loss, night sweats, Fevers,  weight loss  HEENT:  No headaches, Difficulty swallowing,Tooth/dental problems,Sore throat,  No sneezing, itching, ear ache, nasal congestion,  post nasal drip,  Cardio-vascular:  No chest pain, Orthopnea, PND, anasarca, dizziness, palpitations.no Bilateral lower extremity swelling  GI:  No heartburn, indigestion, abdominal pain, nausea, vomiting, diarrhea, change in bowel habits, loss of appetite, melena, blood in stool, hematemesis Resp:  no  No dyspnea on exertion, No excess mucus, no No non-productive cough, No coughing up of blood.No change in color of mucus.No wheezing. Skin:  no rash or lesions. No jaundice GU:  no dysuria, change in color of urine, no urgency or frequency. No straining to urinate.  No flank pain.  Musculoskeletal:  No joint pain or no joint swelling. No decreased range of motion. No back pain.  Psych:  No change in mood or affect. No depression or anxiety. No memory loss.  Neuro: no localizing neurological complaints, no tingling, no weakness, no double vision, no gait abnormality, no slurred speech, no confusion  All systems reviewed and apart from [place] all are negative _______________________________________________________________________________________________ Past Medical History:   Past Medical History:  Diagnosis Date  Acute lower GI bleeding 05/03/2020   Arthritis    gout in feet   Asthma    hx as a child - no inhaler, no problems   Carotid stenosis 06/18/2012   Colon polyps    Diabetes mellitus    Type 2   Dysplasia of cervix, low grade (CIN 1)    s/p cervical conization   Glaucoma    Glaucoma 12/18/2011   Hard of hearing    hear aids - left   HPV (human papilloma virus) infection    Hyperlipidemia    Hypertension    Intrinsic asthma, unspecified 12/18/2011   Mild aortic stenosis 03/14/2017   Mycotic toenails 02/19/2013   PONV (postoperative nausea and vomiting)    SVD (spontaneous vaginal delivery)    x 4      Past Surgical History:  Procedure Laterality Date   BREAST BIOPSY  2010   BREAST BIOPSY  1991   COLONOSCOPY     COLONOSCOPY WITH PROPOFOL N/A 05/06/2020    Procedure: COLONOSCOPY WITH PROPOFOL;  Surgeon: Jerene Bears, MD;  Location: [facility];  Service: Endoscopy;  Laterality: N/A;   DILATION AND CURETTAGE OF UTERUS  2000   HYSTEROSCOPY WITH D & C N/A 03/04/2014   Procedure: DILATATION AND CURETTAGE /HYSTEROSCOPY;  Surgeon: Lavonia Drafts, MD;  Location: [facility];  Service: Gynecology;  Laterality: N/A;   KNEE SURGERY  2010   patella fracture   MULTIPLE TOOTH EXTRACTIONS     PACEMAKER IMPLANT N/A 03/09/2020   Procedure: PACEMAKER IMPLANT;  Surgeon: Constance Haw, MD;  Location: [facility];  Service: Cardiovascular;  Laterality: N/A;   POLYPECTOMY     POLYPECTOMY  05/06/2020   Procedure: POLYPECTOMY;  Surgeon: Jerene Bears, MD;  Location: [facility];  Service: Endoscopy;;    Social History:      reports that she quit smoking about 41 years ago. Her smoking use included cigarettes. She has quit using smokeless tobacco. She reports that she does not drink alcohol and does not use drugs.     Family History:   Family History  Problem Relation Age of Onset   Diabetes Brother    Heart disease Brother    Heart disease Mother    Heart disease Father    Asthma Other    Colon cancer Neg Hx    Rectal cancer Neg Hx    Stomach cancer Neg Hx    Esophageal cancer Neg Hx    ______________________________________________________________________________________________ Allergies: Allergies  Allergen Reactions   Aspirin Other (See Comments)    BLEEDING BEHIND EYES   Penicillins Other (See Comments)    Unknown reaction DID THE REACTION INVOLVE: Swelling of the face/tongue/throat, SOB, or low BP? Unknown Sudden or severe rash/hives, skin peeling, or the inside of the mouth or nose? Unknown Did it require medical treatment? Unknown When did it last happen? Unknown If all above answers are "NO", may proceed with cephalosporin use.     Prior to Admission medications   Medication Sig Start Date End Date Taking?  Authorizing Provider  acetaminophen (TYLENOL) 500 MG tablet Take 500-1,000 mg by mouth every 6 (six) hours as needed for mild pain or headache.    [provider]  allopurinol (ZYLOPRIM) 300 MG tablet Take 1 tablet (300 mg total) by mouth 2 (two) times daily. Patient taking differently: Take 300 mg by mouth as needed. 02/04/21   Biagio Borg, MD  allopurinol (ZYLOPRIM) 300 MG tablet Take 1 tablet (300 mg total) by  mouth 2 (two) times daily for 14 days. 07/07/21 07/21/21  Jeanell Sparrow, DO  diazepam (VALIUM) 5 MG tablet Take 1 tablet (5 mg total) by mouth every 6 (six) hours as needed for anxiety. 03/15/21   Biagio Borg, MD  diazepam (VALIUM) 5 MG tablet Take 1 tablet (5 mg total) by mouth every 6 (six) hours as needed for up to 2 doses for anxiety. 07/07/21   Wynona Dove A, DO  glucose blood (ONETOUCH VERIO) test strip 1 each by Other route 2 (two) times daily. And lancets 2/day 08/03/20   Renato Shin, MD  hydrALAZINE (APRESOLINE) 25 MG tablet Take 1 tablet (25 mg total) by mouth every 8 (eight) hours. 02/04/21   Biagio Borg, MD  hydrALAZINE (APRESOLINE) 25 MG tablet Take 1 tablet (25 mg total) by mouth 3 (three) times daily for 14 days. 07/07/21 07/21/21  Jeanell Sparrow, DO  hydrochlorothiazide (HYDRODIURIL) 12.5 MG tablet Take 2 tablets (25 mg total) by mouth daily for 14 days. 07/07/21 07/21/21  Jeanell Sparrow, DO  hydrochlorothiazide (MICROZIDE) 12.5 MG capsule Take 1 capsule (12.5 mg total) by mouth daily. 02/04/21 02/04/22  Biagio Borg, MD  Insulin NPH, Human,, Isophane, (HUMULIN N KWIKPEN) 100 UNIT/ML Kiwkpen Inject 50 Units into the skin every morning. 08/03/20   Renato Shin, MD  Lancets (ONETOUCH DELICA PLUS [id number]) [place] Test bid 12/28/20   [provider]  LANTUS SOLOSTAR 100 UNIT/ML Solostar Pen SMARTSIG:10 Unit(s) SUB-Q Daily 09/05/21   [provider]  loperamide (IMODIUM) 2 MG capsule Take by mouth as needed for diarrhea or loose stools.    [provider]  magnesium hydroxide (MILK OF MAGNESIA) 400 MG/5ML suspension Take by mouth as needed for mild constipation.    [provider]  metFORMIN (GLUCOPHAGE) 500 MG tablet Take 1 tablet (500 mg total) by mouth 2 (two) times daily with a meal for 14 days. 07/07/21 07/21/21  Jeanell Sparrow, DO  QUEtiapine (SEROQUEL) 25 MG tablet Take 1 tablet (25 mg total) by mouth at bedtime for 14 days. 07/07/21 07/21/21  Jeanell Sparrow, DO  risperiDONE (RISPERDAL) 1 MG/ML oral solution Take 2 mg by mouth 2 (two) times daily. 10/25/21   [provider]  rosuvastatin (CRESTOR) 40 MG tablet Take 1 tablet (40 mg total) by mouth daily. 02/04/21   Biagio Borg, MD  rosuvastatin (CRESTOR) 40 MG tablet Take 1 tablet (40 mg total) by mouth daily for 14 days. 07/07/21 07/21/21  Jeanell Sparrow, DO    ___________________________________________________________________________________________________ Physical Exam:    04/25/2022    6:39 PM 04/25/2022    5:35 PM 04/25/2022    4:30 PM  Vitals with BMI  Systolic 580 998 338  Diastolic 67 58 66  Pulse 70 64 63     1. General:  in No  Acute distress   Chronically ill   -appearing 2. Psychological: Alert and   Oriented 3. Head/ENT:    Dry Mucous Membranes                          Head Non traumatic, neck supple                          Poor Dentition 4. SKIN: decreased Skin turgor,  Skin clean Dry   5. Heart: Regular rate and rhythm no  Murmur, no Rub or gallop 6. Lungs: , no wheezes or crackles  7. Abdomen: Soft,  non-tender, Non distended   obese  bowel sounds present 8. Lower extremities: no clubbing, cyanosis, no  edema 9. Neurologically Grossly intact, moving all 4 extremities equally  10. MSK: Normal range of motion  sacral decub -per ER MD  Chart has been reviewed  ______________________________________________________________________________________________  Assessment/Plan 81 y.o. female with medical history significant of dementia  hypertension hyperlipidemia CKD, gout, complete AV block status post pacemaker Carotid stenosis, HOH, glaucoma  Admitted for post COVID-pneumonia Present on Admission:  Hypokalemia  COVID-19 virus infection  CAP (community acquired pneumonia)  Anemia  CKD (chronic kidney disease), stage III (HCC)     Hypokalemia - will replace and repeat in AM,  check magnesium level and replace as needed   CAP (community acquired pneumonia)  - -Patient presenting with  productive cough, and infiltrate on chest x-ray -Infiltrate on CXR and 2-3 characteristics (fever, leukocytosis, purulent sputum) are consistent with pneumonia. -This appears to be most likely community-acquired pneumonia. post viral given recent COVId infection      will admit for treatment of CAP will start on appropriate antibiotic coverage. Rocephin/azithromycin   Obtain:  sputum cultures,                  influenza serologies negative                  COVID PCR still postiive                 blood cultures and sputum cultures ordered                   strep pneumo UA antigen,                   check for Legionella antigen.                Provide oxygen as needed.     Anemia obtain anemia panel in am Blood in diaper was due to decub ulcer  Diabetes (HCC)  - Order Sensitive SSI   - continue home insulin regimen decrease to   Lantus 5 units,  -  check TSH and HgA1C  - Hold by mouth medications    CKD (chronic kidney disease), stage III (HCC)  -chronic avoid nephrotoxic medications such as NSAIDs, Vanco Zosyn combo,  avoid hypotension, continue to follow renal function  Elevated D-dimer after 5 Check Dopplers and VQ scan recent COVID infection Given bleeding order Kayleen Memos this is evidence of blood clot will order SCDs hold off on Lovenox  Other plan as per orders.  DVT prophylaxis:  SCD     Code Status:    Code Status: Prior FULL CODE  as per patient  family  I had personally discussed CODE STATUS with patient       Family Communication:   Family not at  Bedside    Disposition Plan:                              Back to current facility when stable                             Following barriers for discharge:                            Electrolytes corrected  Anemia corrected                             Pain controlled with PO medications                               Afebrile, white count improving able to transition to PO antibiotics                             Will need to be able to tolerate PO                            Will likely need home health, home O2, set up                           Will need consultants to evaluate patient prior to discharge                        Would benefit from PT/OT eval prior to DC  Ordered                   Swallow eval - SLP ordered                   Diabetes care coordinator                   Transition of care consulted                   Nutrition    consulted                  Wound care  consulted                   Palliative care    consulted                    Consults called:  none    Admission status:  ED Disposition     ED Disposition  Admit   Condition  --   [place]: [place] [[id number]]  Level of Care: Telemetry [5]  Admit to tele based on following criteria: Other see comments  Comments: reecent covid  May place patient in observation at [facility] or [place] if equivalent level of care is available:: No  Covid Evaluation: Confirmed COVID Positive  Diagnosis: CAP (community acquired pneumonia) [010932]  Admitting Physician: Toy Baker [[id number]]  Attending Physician: Toy Baker [[id number]]           Obs    Level of care     tele  For 12H      Lab Results  Component Value Date   [place] (A) 04/25/2022     Precautions: admitted as covid positive Airborne and Contact precautions     Enrrique Mierzwa 04/25/2022, 8:57 PM     [facility]     after 2 AM please page floor coverage PA If 7AM-7PM, please contact the day team taking care of the patient using [email or web address]   Patient was evaluated in the context of the global COVID-19 pandemic, which necessitated consideration that the patient might be at risk for infection with the SARS-CoV-2 virus that causes COVID-19. Institutional protocols  and algorithms that pertain to the evaluation of patients at risk for COVID-19 are in a state of rapid change based on information released by regulatory bodies including the CDC and federal and state organizations. These policies and algorithms were followed during the patient's care.

## 2022-04-25 NOTE — Assessment & Plan Note (Signed)
- -  Patient presenting with  productive cough, and infiltrate on chest x-ray -Infiltrate on CXR and 2-3 characteristics (fever, leukocytosis, purulent sputum) are consistent with pneumonia. -This appears to be most likely community-acquired pneumonia. post viral given recent COVId infection      will admit for treatment of CAP will start on appropriate antibiotic coverage. Rocephin/azithromycin   Obtain:  sputum cultures,                  influenza serologies negative                  COVID PCR still postiive                 blood cultures and sputum cultures ordered                   strep pneumo UA antigen,                   check for Legionella antigen.                Provide oxygen as needed.

## 2022-04-26 ENCOUNTER — Observation Stay (HOSPITAL_COMMUNITY): Payer: Medicare Other

## 2022-04-26 DIAGNOSIS — E1122 Type 2 diabetes mellitus with diabetic chronic kidney disease: Secondary | ICD-10-CM | POA: Diagnosis present

## 2022-04-26 DIAGNOSIS — F03C11 Unspecified dementia, severe, with agitation: Secondary | ICD-10-CM | POA: Diagnosis not present

## 2022-04-26 DIAGNOSIS — I129 Hypertensive chronic kidney disease with stage 1 through stage 4 chronic kidney disease, or unspecified chronic kidney disease: Secondary | ICD-10-CM | POA: Diagnosis present

## 2022-04-26 DIAGNOSIS — E11649 Type 2 diabetes mellitus with hypoglycemia without coma: Secondary | ICD-10-CM | POA: Diagnosis present

## 2022-04-26 DIAGNOSIS — Z794 Long term (current) use of insulin: Secondary | ICD-10-CM | POA: Diagnosis not present

## 2022-04-26 DIAGNOSIS — Z8249 Family history of ischemic heart disease and other diseases of the circulatory system: Secondary | ICD-10-CM | POA: Diagnosis not present

## 2022-04-26 DIAGNOSIS — Z950 Presence of cardiac pacemaker: Secondary | ICD-10-CM | POA: Diagnosis not present

## 2022-04-26 DIAGNOSIS — K625 Hemorrhage of anus and rectum: Secondary | ICD-10-CM | POA: Diagnosis present

## 2022-04-26 DIAGNOSIS — K649 Unspecified hemorrhoids: Secondary | ICD-10-CM | POA: Diagnosis present

## 2022-04-26 DIAGNOSIS — I509 Heart failure, unspecified: Secondary | ICD-10-CM

## 2022-04-26 DIAGNOSIS — R791 Abnormal coagulation profile: Secondary | ICD-10-CM

## 2022-04-26 DIAGNOSIS — L89310 Pressure ulcer of right buttock, unstageable: Secondary | ICD-10-CM | POA: Diagnosis present

## 2022-04-26 DIAGNOSIS — J189 Pneumonia, unspecified organism: Secondary | ICD-10-CM | POA: Diagnosis present

## 2022-04-26 DIAGNOSIS — I442 Atrioventricular block, complete: Secondary | ICD-10-CM | POA: Diagnosis present

## 2022-04-26 DIAGNOSIS — E785 Hyperlipidemia, unspecified: Secondary | ICD-10-CM | POA: Diagnosis present

## 2022-04-26 DIAGNOSIS — L89150 Pressure ulcer of sacral region, unstageable: Secondary | ICD-10-CM | POA: Diagnosis present

## 2022-04-26 DIAGNOSIS — Z87891 Personal history of nicotine dependence: Secondary | ICD-10-CM | POA: Diagnosis not present

## 2022-04-26 DIAGNOSIS — N179 Acute kidney failure, unspecified: Secondary | ICD-10-CM | POA: Diagnosis present

## 2022-04-26 DIAGNOSIS — N1831 Chronic kidney disease, stage 3a: Secondary | ICD-10-CM | POA: Diagnosis present

## 2022-04-26 DIAGNOSIS — U099 Post covid-19 condition, unspecified: Secondary | ICD-10-CM | POA: Diagnosis present

## 2022-04-26 DIAGNOSIS — U071 COVID-19: Secondary | ICD-10-CM | POA: Diagnosis not present

## 2022-04-26 DIAGNOSIS — F0390 Unspecified dementia without behavioral disturbance: Secondary | ICD-10-CM | POA: Diagnosis present

## 2022-04-26 DIAGNOSIS — D649 Anemia, unspecified: Secondary | ICD-10-CM | POA: Diagnosis present

## 2022-04-26 DIAGNOSIS — L89610 Pressure ulcer of right heel, unstageable: Secondary | ICD-10-CM | POA: Diagnosis present

## 2022-04-26 DIAGNOSIS — E876 Hypokalemia: Secondary | ICD-10-CM | POA: Diagnosis present

## 2022-04-26 DIAGNOSIS — E1165 Type 2 diabetes mellitus with hyperglycemia: Secondary | ICD-10-CM | POA: Diagnosis present

## 2022-04-26 DIAGNOSIS — Z8616 Personal history of COVID-19: Secondary | ICD-10-CM | POA: Diagnosis not present

## 2022-04-26 LAB — ECHOCARDIOGRAM COMPLETE
AR max vel: 1.32 cm2
AV Area VTI: 1.18 cm2
AV Area mean vel: 1.24 cm2
AV Mean grad: 12.3 mmHg
AV Peak grad: 21.5 mmHg
Ao pk vel: 2.32 m/s
Area-P 1/2: 2.99 cm2
Calc EF: 53.8 %
Height: 60 in
S' Lateral: 2.9 cm
Single Plane A2C EF: 54.1 %
Single Plane A4C EF: 54.2 %
Weight: 2452.8 oz

## 2022-04-26 LAB — CBC WITH DIFFERENTIAL/PLATELET
Abs Immature Granulocytes: 0.07 10*3/uL (ref 0.00–0.07)
Basophils Absolute: 0 10*3/uL (ref 0.0–0.1)
Basophils Relative: 0 %
Eosinophils Absolute: 0.1 10*3/uL (ref 0.0–0.5)
Eosinophils Relative: 1 %
HCT: 30 % — ABNORMAL LOW (ref 36.0–46.0)
Hemoglobin: 9.9 g/dL — ABNORMAL LOW (ref 12.0–15.0)
Immature Granulocytes: 1 %
Lymphocytes Relative: 10 %
Lymphs Abs: 1.2 10*3/uL (ref 0.7–4.0)
MCH: 29.9 pg (ref 26.0–34.0)
MCHC: 33 g/dL (ref 30.0–36.0)
MCV: 90.6 fL (ref 80.0–100.0)
Monocytes Absolute: 1 10*3/uL (ref 0.1–1.0)
Monocytes Relative: 9 %
Neutro Abs: 9.7 10*3/uL — ABNORMAL HIGH (ref 1.7–7.7)
Neutrophils Relative %: 79 %
Platelets: 299 10*3/uL (ref 150–400)
RBC: 3.31 MIL/uL — ABNORMAL LOW (ref 3.87–5.11)
RDW: 14 % (ref 11.5–15.5)
WBC: 12.1 10*3/uL — ABNORMAL HIGH (ref 4.0–10.5)
nRBC: 0 % (ref 0.0–0.2)

## 2022-04-26 LAB — COMPREHENSIVE METABOLIC PANEL
ALT: 11 U/L (ref 0–44)
AST: 18 U/L (ref 15–41)
Albumin: 2 g/dL — ABNORMAL LOW (ref 3.5–5.0)
Alkaline Phosphatase: 71 U/L (ref 38–126)
Anion gap: 8 (ref 5–15)
BUN: 36 mg/dL — ABNORMAL HIGH (ref 8–23)
CO2: 25 mmol/L (ref 22–32)
Calcium: 8.4 mg/dL — ABNORMAL LOW (ref 8.9–10.3)
Chloride: 110 mmol/L (ref 98–111)
Creatinine, Ser: 1.34 mg/dL — ABNORMAL HIGH (ref 0.44–1.00)
GFR, Estimated: 39 mL/min — ABNORMAL LOW (ref >60)
Glucose, Bld: 149 mg/dL — ABNORMAL HIGH (ref 70–99)
Potassium: 3.5 mmol/L (ref 3.5–5.1)
Sodium: 143 mmol/L (ref 135–145)
Total Bilirubin: 0.4 mg/dL (ref 0.3–1.2)
Total Protein: 5.5 g/dL — ABNORMAL LOW (ref 6.5–8.1)

## 2022-04-26 LAB — PHOSPHORUS: Phosphorus: 3.1 mg/dL (ref 2.5–4.6)

## 2022-04-26 LAB — URINE CULTURE: Culture: NO GROWTH

## 2022-04-26 LAB — D-DIMER, QUANTITATIVE: D-Dimer, Quant: 6.99 ug/mL-FEU — ABNORMAL HIGH (ref 0.00–0.50)

## 2022-04-26 LAB — MAGNESIUM: Magnesium: 1.8 mg/dL (ref 1.7–2.4)

## 2022-04-26 LAB — FERRITIN: Ferritin: 481 ng/mL — ABNORMAL HIGH (ref 11–307)

## 2022-04-26 LAB — GLUCOSE, CAPILLARY
Glucose-Capillary: 153 mg/dL — ABNORMAL HIGH (ref 70–99)
Glucose-Capillary: 123 mg/dL — ABNORMAL HIGH (ref 70–99)
Glucose-Capillary: 113 mg/dL — ABNORMAL HIGH (ref 70–99)
Glucose-Capillary: 150 mg/dL — ABNORMAL HIGH (ref 70–99)
Glucose-Capillary: 190 mg/dL — ABNORMAL HIGH (ref 70–99)
Glucose-Capillary: 116 mg/dL — ABNORMAL HIGH (ref 70–99)

## 2022-04-26 LAB — C-REACTIVE PROTEIN: CRP: 11.1 mg/dL — ABNORMAL HIGH (ref <1.0)

## 2022-04-26 LAB — LEGIONELLA PNEUMOPHILA SEROGP 1 UR AG: L. pneumophila Serogp 1 Ur Ag: NEGATIVE

## 2022-04-26 LAB — OSMOLALITY: Osmolality: 299 mOsm/kg — ABNORMAL HIGH (ref 275–295)

## 2022-04-26 LAB — PREALBUMIN: Prealbumin: 6 mg/dL — ABNORMAL LOW (ref 18–38)

## 2022-04-26 MED ORDER — INSULIN ASPART 100 UNIT/ML IJ SOLN
0.0000 [IU] | Freq: Three times a day (TID) | INTRAMUSCULAR | Status: DC
  Administered 2022-04-26: 3 [IU] via SUBCUTANEOUS
  Administered 2022-04-27: 5 [IU] via SUBCUTANEOUS
  Administered 2022-04-27: 2 [IU] via SUBCUTANEOUS
  Administered 2022-04-28 (×2): 3 [IU] via SUBCUTANEOUS
  Administered 2022-04-29 – 2022-04-30 (×2): 2 [IU] via SUBCUTANEOUS
  Administered 2022-04-30: 11 [IU] via SUBCUTANEOUS
  Administered 2022-04-30: 5 [IU] via SUBCUTANEOUS
  Administered 2022-05-01: 3 [IU] via SUBCUTANEOUS
  Administered 2022-05-01: 5 [IU] via SUBCUTANEOUS

## 2022-04-26 MED ORDER — ENSURE ENLIVE PO LIQD
237.0000 mL | Freq: Two times a day (BID) | ORAL | Status: DC
  Administered 2022-04-26 – 2022-05-01 (×7): 237 mL via ORAL

## 2022-04-26 MED ORDER — ACETAMINOPHEN 650 MG RE SUPP: 650.0000 mg | Freq: Four times a day (QID) | RECTAL | Status: DC | PRN

## 2022-04-26 MED ORDER — GLUCERNA SHAKE PO LIQD
237.0000 mL | Freq: Three times a day (TID) | ORAL | Status: DC
  Administered 2022-04-27 – 2022-05-01 (×5): 237 mL via ORAL
  Filled 2022-04-26 (×16): qty 237

## 2022-04-26 MED ORDER — RISPERIDONE 1 MG/ML PO SOLN
2.0000 mg | Freq: Two times a day (BID) | ORAL | Status: DC
  Administered 2022-04-26 – 2022-04-28 (×5): 2 mg via ORAL
  Filled 2022-04-26 (×7): qty 2

## 2022-04-26 MED ORDER — VITAMIN C 500 MG PO TABS
500.0000 mg | ORAL_TABLET | Freq: Two times a day (BID) | ORAL | Status: DC
  Administered 2022-04-26 – 2022-05-01 (×9): 500 mg via ORAL
  Filled 2022-04-26 (×9): qty 1

## 2022-04-26 MED ORDER — TECHNETIUM TO 99M ALBUMIN AGGREGATED
4.0700 | Freq: Once | INTRAVENOUS | Status: AC | PRN
  Administered 2022-04-26: 4.07 via INTRAVENOUS

## 2022-04-26 MED ORDER — ROSUVASTATIN CALCIUM 20 MG PO TABS
40.0000 mg | ORAL_TABLET | Freq: Every day | ORAL | Status: DC
  Administered 2022-04-26 – 2022-05-01 (×6): 40 mg via ORAL
  Filled 2022-04-26 (×6): qty 2

## 2022-04-26 MED ORDER — SENNA 8.6 MG PO TABS
1.0000 | ORAL_TABLET | Freq: Two times a day (BID) | ORAL | Status: DC
  Administered 2022-04-26 – 2022-04-30 (×10): 8.6 mg via ORAL
  Filled 2022-04-26 (×10): qty 1

## 2022-04-26 MED ORDER — ALBUTEROL SULFATE HFA 108 (90 BASE) MCG/ACT IN AERS
2.0000 | INHALATION_SPRAY | RESPIRATORY_TRACT | Status: DC | PRN
Start: 2022-04-26 — End: 2022-05-01

## 2022-04-26 MED ORDER — ADULT MULTIVITAMIN W/MINERALS CH
1.0000 | ORAL_TABLET | Freq: Every day | ORAL | Status: DC
Start: 2022-04-26 — End: 2022-05-01
  Administered 2022-04-26 – 2022-05-01 (×6): 1 via ORAL
  Filled 2022-04-26 (×6): qty 1

## 2022-04-26 MED ORDER — DOCUSATE SODIUM 100 MG PO CAPS
100.0000 mg | ORAL_CAPSULE | Freq: Two times a day (BID) | ORAL | Status: DC
  Administered 2022-04-26 – 2022-05-01 (×11): 100 mg via ORAL
  Filled 2022-04-26 (×11): qty 1

## 2022-04-26 MED ORDER — ORAL CARE MOUTH RINSE: 15.0000 mL | OROMUCOSAL | Status: DC | PRN

## 2022-04-26 MED ORDER — HYDROCODONE-ACETAMINOPHEN 5-325 MG PO TABS: 1.0000 | ORAL_TABLET | ORAL | Status: DC | PRN

## 2022-04-26 MED ORDER — ZINC SULFATE 220 (50 ZN) MG PO CAPS
220.0000 mg | ORAL_CAPSULE | Freq: Every day | ORAL | Status: DC
  Administered 2022-04-26 – 2022-05-01 (×6): 220 mg via ORAL
  Filled 2022-04-26 (×6): qty 1

## 2022-04-26 MED ORDER — SODIUM CHLORIDE 0.9 % IV SOLN: INTRAVENOUS | Status: DC

## 2022-04-26 MED ORDER — MEDIHONEY WOUND/BURN DRESSING EX PSTE
1.0000 | PASTE | Freq: Every day | CUTANEOUS | Status: DC
  Administered 2022-04-26 – 2022-05-01 (×6): 1 via TOPICAL
  Filled 2022-04-26: qty 44

## 2022-04-26 MED ORDER — QUETIAPINE FUMARATE 25 MG PO TABS
25.0000 mg | ORAL_TABLET | Freq: Every day | ORAL | Status: DC
  Administered 2022-04-26 – 2022-04-30 (×4): 25 mg via ORAL
  Filled 2022-04-26 (×4): qty 1

## 2022-04-26 MED ORDER — INSULIN ASPART 100 UNIT/ML IJ SOLN: 0.0000 [IU] | Freq: Every day | INTRAMUSCULAR | Status: DC

## 2022-04-26 MED ORDER — DIAZEPAM 5 MG PO TABS: 5.0000 mg | ORAL_TABLET | Freq: Four times a day (QID) | ORAL | Status: DC | PRN

## 2022-04-26 MED ORDER — ACETAMINOPHEN 325 MG PO TABS: 650.0000 mg | ORAL_TABLET | Freq: Four times a day (QID) | ORAL | Status: DC | PRN

## 2022-04-26 NOTE — Progress Notes (Signed)
Initial Nutrition Assessment  DOCUMENTATION CODES:   Not applicable  INTERVENTION:   -MVI with minerals daily -Magic cup TID with meals, each supplement provides 290 kcal and 9 grams of protein  -500 mg vitamin C BID -220 mg zinc sulfate daily x 14 days -Glucerna Shake po TID, each supplement provides 220 kcal and 10 grams of protein  -Downgrade diet to dysphagia 3 diet for ease of intake  NUTRITION DIAGNOSIS:   Increased nutrient needs related to wound healing as evidenced by estimated needs.  GOAL:   Patient will meet greater than or equal to 90% of their needs  MONITOR:   PO intake, Supplement acceptance  REASON FOR ASSESSMENT:   Consult Assessment of nutrition requirement/status  ASSESSMENT:   Pt with medical history significant of dementia, hypertension, hyperlipidemia, CKD, gout, complete AV block status post pacemaker, carotid stenosis, HOH, and glaucoma. Admitted with post COVID-19 pneumonia.  Pt admitted with post- COVID-19 pneumonia.   9/6- s/p BSE- dysphagia 3 diet with thin liquids  Reviewed I/O's: +3.3 L x 24 hours   UOP: 100 ml x 24 hours  Pt unavailable at time of visit. Attempted to speak with pt via call to hospital room phone, however, unable to reach. RD unable to obtain further nutrition-related history or complete nutrition-focused physical exam at this time.    Per CWOCN note, pt with UN stable pressure injuries to sacrum, lt ischium, lt ischium, and rt heel.    Pt currently on a carb modified diet. No meal completion data available to assess at this time.   Reviewed wt hx; wt has been stable over the past 4 months.   Pt with increased nutritional needs and would benefit from addition of oral nutrition supplements.   Medications reviewed and include colace and senokot.   Lab Results  Component Value Date   HGBA1C 8.5 (H) 04/25/2022   PTA DM medications are 500 ml metformin BID, 50 units insulin NPH every morning and 10 units lantus  solostar daily. Per ADA's Standards of Medical Care of Diabetes, glycemic targets for older adults who have multiple co-morbidities, cognitive impairments, and functional dependence should be less stringent (Hgb A1c <8.0-8.5).    Labs reviewed: CBGS: 113-150 (inpatient orders for glycemic control are 0-15 units insulin aspart TID with meals and 0-5 units insulin aspart daily at bedtime).    Diet Order:   Diet Order             Diet Carb Modified Fluid consistency: Thin; Room service appropriate? Yes  Diet effective now                   EDUCATION NEEDS:   No education needs have been identified at this time  Skin:  Skin Assessment: Skin Integrity Issues: Skin Integrity Issues:: Unstageable Unstageable: lt ischium, sacrum, rt ischium, rt heel  Last BM:  04/26/22 (type 4)  Height:   Ht Readings from Last 1 Encounters:  04/25/22 5' (1.524 m)    Weight:   Wt Readings from Last 1 Encounters:  04/25/22 69.5 kg    Ideal Body Weight:  45.5 kg  BMI:  Body mass index is 29.94 kg/m.  Estimated Nutritional Needs:   Kcal:  1900-2100  Protein:  105-120 grams  Fluid:  > 1.9 L    Loistine Chance, RD, LDN, [place] Registered Dietitian II Certified Diabetes Care and Education Specialist Please refer to [facility] for RD and/or RD on-call/weekend/after hours pager

## 2022-04-26 NOTE — Evaluation (Signed)
Occupational Therapy Evaluation Patient Details Name: Gail Lynch MRN: [id number] DOB: 12-09-1940 Today's Date: 04/26/2022   History of Present Illness 81 year old female with dementia admitted from [facility] care unit with blood in adult diaper. Chest x-ray showed evidence of diffuse bilateral infiltrates patient started on Rocephin azithromycin plan to admit for community-acquired pneumonia treatment.  Patient has already been receiving Paxlovid since positive COVID test in August.  Noted to have elevated white blood cell count worrisome for postviral pneumonia. Past medical history significant of dementia hypertension hyperlipidemia CKD, gout, complete AV block status post pacemaker  Carotid stenosis, HOH, glaucoma   Clinical Impression   Patient is currently requiring assistance with ADLs including Total assist with bed level Lower body ADLs, up to maximum assist with seated Upper body ADLs,  as well as  maximum assist with bed mobility and moderate assist with functional transfers to toilet.  Current level of function may be below patient's typical baseline which is currently unknown as daughter reports that pt's ALF has not given her much information.   During this evaluation, patient was limited by generalized weakness, impaired activity tolerance, and baseline dementia with difficulty following instructions, all of which has the potential to impact patient's safety and independence during functional mobility, as well as performance for ADLs.  Patient lives at [place] in the memory care unit. No other information known.  Patient demonstrates fair rehab potential, and should benefit from continued skilled occupational therapy services while in acute care to maximize safety, independence and quality of life at home.  Continued occupational therapy services in a SNF setting prior to return home is recommended.  ?     Recommendations for follow up therapy are one component  of a multi-disciplinary discharge planning process, led by the attending physician.  Recommendations may be updated based on patient status, additional functional criteria and insurance authorization.   Follow Up Recommendations  Skilled nursing-short term rehab (<3 hours/day)    Assistance Recommended at Discharge Frequent or constant Supervision/Assistance  Patient can return home with the following A lot of help with walking and/or transfers;A lot of help with bathing/dressing/bathroom;Direct supervision/assist for medications management;Direct supervision/assist for financial management;Assist for transportation;Assistance with cooking/housework;Assistance with feeding;Help with stairs or ramp for entrance    Functional Status Assessment  Patient has had a recent decline in their functional status and demonstrates the ability to make significant improvements in function in a reasonable and predictable amount of time. (Prior level of function unknown)  Equipment Recommendations  None recommended by OT    Recommendations for Other Services       Precautions / Restrictions Precautions Precautions: Fall Precaution Comments: Airborn Restrictions Weight Bearing Restrictions: No      Mobility Bed Mobility Overal bed mobility: Needs Assistance Bed Mobility: Rolling, Sidelying to Sit, Sit to Supine Rolling: Min assist Sidelying to sit: HOB elevated, Max assist   Sit to supine: Mod assist, HOB elevated   General bed mobility comments: Multimodal Cues for sequence, use of bed rail, Max As of 1 person to advance BLEs off EOB and to raise trunk.  With return to bed, pt able to control trunk once positioned on side with hand on bedrail. Then required Mod As for LEs.    Transfers                          Balance Overall balance assessment: Needs assistance Sitting-balance support: Feet supported, Single extremity supported Sitting  balance-Leahy Scale: Fair     Standing  balance support: Bilateral upper extremity supported, During functional activity Standing balance-Leahy Scale: Poor               High level balance activites: Side stepping High Level Balance Comments: Pt took ~2 side steps with RW and Min-Mod As with step by step cues as pt trying to ambulate forward.           ADL either performed or assessed with clinical judgement   ADL Overall ADL's : Needs assistance/impaired Eating/Feeding: Moderate assistance;Bed level;Cueing for sequencing   Grooming: Minimal assistance;Sitting;Cueing for sequencing   Upper Body Bathing: Moderate assistance;Bed level;Cueing for sequencing   Lower Body Bathing: Total assistance;Bed level;Cueing for sequencing   Upper Body Dressing : Moderate assistance;Cueing for sequencing;Sitting   Lower Body Dressing: Total assistance;Bed level;Cueing for sequencing   Toilet Transfer: Rolling walker (2 wheels);Moderate assistance;Cueing for sequencing Toilet Transfer Details (indicate cue type and reason): Pt stood from EOB with verbal "1,2,3" cue and visual cue of RW. Mod As required to power up with pt keeping hands on RW and OT securing RW to floor. Toileting- Clothing Manipulation and Hygiene: Total assistance;Bed level Toileting - Clothing Manipulation Details (indicate cue type and reason): Pt is diaper level void at baseline. On purewick.     Functional mobility during ADLs: Moderate assistance;Maximal assistance;Rolling walker (2 wheels);Cueing for sequencing;Cueing for safety       Vision Baseline Vision/History: 3 Glaucoma Ability to See in Adequate Light: 2 Moderately impaired       Perception     Praxis      Pertinent Vitals/Pain Pain Assessment Pain Assessment: Faces Faces Pain Scale: Hurts a little bit Pain Location: Facial grimace when mobilizing to EOB. Pain Descriptors / Indicators: Grimacing Pain Intervention(s): Limited activity within patient's tolerance, Monitored during  session, Repositioned     Hand Dominance Right   Extremity/Trunk Assessment Upper Extremity Assessment Upper Extremity Assessment: Generalized weakness   Lower Extremity Assessment Lower Extremity Assessment: Defer to PT evaluation   Cervical / Trunk Assessment Cervical / Trunk Assessment: Kyphotic   Communication Communication Communication: HOH;Other (comment) (dementia)   Cognition Arousal/Alertness: Awake/alert Behavior During Therapy: Flat affect Overall Cognitive Status: History of cognitive impairments - at baseline                                 General Comments: Pt with dementia. Oriented to person only. Able to follow 1-step instructions with cues and increased time. Did well with visual cue of 2 wheeled RW and when asked what it was used for, pt replied "Get up and walk".     General Comments       Exercises     Shoulder Instructions      Home Living Family/patient expects to be discharged to:: Assisted living                             Home Equipment: Rolling Walker (2 wheels);Cane - single point   Additional Comments: Per daughter, pt resides at [place] in the memory care unit. DME copied from old Evaluation >1 year ago.      Prior Functioning/Environment Prior Level of Function : Needs assist;Patient poor historian/Family not available             Mobility Comments: Unfortunately pt's daughter unable to give much information about pt's prior  level of function, level of assistance or whether pt was receiving PT and OT at [facility]. Daughter reported that she calls for updates but does not get much information.          OT Problem List: Decreased activity tolerance;Impaired balance (sitting and/or standing);Decreased knowledge of precautions;Impaired UE functional use;Decreased strength;Decreased safety awareness;Decreased knowledge of use of DME or AE      OT Treatment/Interventions:      OT  Goals(Current goals can be found in the care plan section) Acute Rehab OT Goals OT Goal Formulation: Patient unable to participate in goal setting Time For Goal Achievement: 05/10/22 Potential to Achieve Goals: Fair ADL Goals Pt Will Perform Eating: with set-up;with supervision;sitting Pt Will Perform Grooming: sitting;with set-up;with supervision (With good balance at EOB) Pt/caregiver will Perform Home Exercise Program: Both right and left upper extremity;Increased strength;With minimal assist (In order to assist with functional transfers and ADLs. With SpO2 >90%) Additional ADL Goal #1: Pt will demonstrate transfers EOB->Chair or wheelchair with no more than Min guard assist in oprder to decrease caregiver burden when assisting pt with ADLs at her ALF  OT Frequency:      Co-evaluation              AM-PAC OT "6 Clicks" Daily Activity     Outcome Measure Help from another person eating meals?: A Lot Help from another person taking care of personal grooming?: A Little Help from another person toileting, which includes using toliet, bedpan, or urinal?: Total Help from another person bathing (including washing, rinsing, drying)?: A Lot Help from another person to put on and taking off regular upper body clothing?: A Lot Help from another person to put on and taking off regular lower body clothing?: Total 6 Click Score: 11   End of Session Equipment Utilized During Treatment: Rolling walker (2 wheels) Nurse Communication: Mobility status;Other (comment) (Soiled purewick and IV alarm)  Activity Tolerance: Patient tolerated treatment well Patient left: in bed;with nursing/sitter in room;with call bell/phone within reach  OT Visit Diagnosis: Unsteadiness on feet (R26.81);Muscle weakness (generalized) (M62.81)                Time: 8588-5027 OT Time Calculation (min): 18 min Charges:  OT General Charges $OT Visit: 1 Visit OT Evaluation $OT Eval Low Complexity: [place],  [place] Office: [phone] 04/26/2022  Julien Girt 04/26/2022, 9:44 AM

## 2022-04-26 NOTE — Evaluation (Signed)
Clinical/Bedside Swallow Evaluation Patient Details  Name: Stephania Macfarlane MRN: [id number] Date of Birth: Feb 16, 1941  Today's Date: 04/26/2022 Time: SLP Start Time (ACUTE ONLY): 1039 SLP Stop Time (ACUTE ONLY): 1119 SLP Time Calculation (min) (ACUTE ONLY): 40 min  Past Medical History:  Past Medical History:  Diagnosis Date   Acute lower GI bleeding 05/03/2020   Arthritis    gout in feet   Asthma    hx as a child - no inhaler, no problems   Carotid stenosis 06/18/2012   Colon polyps    Diabetes mellitus    Type 2   Dysplasia of cervix, low grade (CIN 1)    s/p cervical conization   Glaucoma    Glaucoma 12/18/2011   Hard of hearing    hear aids - left   HPV (human papilloma virus) infection    Hyperlipidemia    Hypertension    Intrinsic asthma, unspecified 12/18/2011   Mild aortic stenosis 03/14/2017   Mycotic toenails 02/19/2013   PONV (postoperative nausea and vomiting)    SVD (spontaneous vaginal delivery)    x 4   Past Surgical History:  Past Surgical History:  Procedure Laterality Date   BREAST BIOPSY  2010   BREAST BIOPSY  1991   COLONOSCOPY     COLONOSCOPY WITH PROPOFOL N/A 05/06/2020   Procedure: COLONOSCOPY WITH PROPOFOL;  Surgeon: Jerene Bears, MD;  Location: [facility];  Service: Endoscopy;  Laterality: N/A;   DILATION AND CURETTAGE OF UTERUS  2000   HYSTEROSCOPY WITH D & C N/A 03/04/2014   Procedure: DILATATION AND CURETTAGE /HYSTEROSCOPY;  Surgeon: Lavonia Drafts, MD;  Location: [facility];  Service: Gynecology;  Laterality: N/A;   KNEE SURGERY  2010   patella fracture   MULTIPLE TOOTH EXTRACTIONS     PACEMAKER IMPLANT N/A 03/09/2020   Procedure: PACEMAKER IMPLANT;  Surgeon: Constance Haw, MD;  Location: [facility];  Service: Cardiovascular;  Laterality: N/A;   POLYPECTOMY     POLYPECTOMY  05/06/2020   Procedure: POLYPECTOMY;  Surgeon: Jerene Bears, MD;  Location: [facility] END[facility];  Service: Endoscopy;;   HPI:  81 year old female with  dementia admitted from [facility] memory care unit with blood in adult diaper. Chest x-ray showed evidence of diffuse bilateral infiltrates patient started on Rocephin azithromycin plan to admit for community-acquired pneumonia treatment.  Patient has already been receiving Paxlovid since positive COVID test in August.  Noted to have elevated white blood cell count worrisome for postviral pneumonia. Past medical history significant of dementia hypertension hyperlipidemia CKD, gout, complete AV block status post pacemaker  Carotid stenosis, HOH, glaucoma. Swallow eval ordered by admitting MD.  Pt has cervical osteophytes at C5-C6.    Assessment / Plan / Recommendation  Clinical Impression  Patient presents with overall functional swallow based on clinical swallow evaluation. She did not however pass the 3 ounce Yale swallow screen due to requiring rest break. No focal CN deficits and voice is clear.  Subtle cough x1 when consuming hot coffee - suspect due to inhalation, but no further coughing episodes with thin.  Pt does admit to occasional issues coughing with intake - with food more than drink.  She has Cervical osteophyte from C5-C6 that may impinge into cervical esophagus/distal pharynx impacting clearance. Given she is edentulous, recommend mechanical soft/thin diet with strict precautions. Medications with puree advised due to pt report of dysphagia and current medical ilness, pneumonia. Advised RN to recommendations and left swallow precaution signs. SLP Visit Diagnosis:  Dysphagia, unspecified (R13.10)    Aspiration Risk  Mild aspiration risk    Diet Recommendation Dysphagia 3 (Mech soft);Thin liquid   Liquid Administration via: Cup;Straw Medication Administration: Whole meds with puree Supervision: Patient able to self feed Compensations: Slow rate;Small sips/bites Postural Changes: Seated upright at 90 degrees;Remain upright for at least 30 minutes after po intake    Other   Recommendations Oral Care Recommendations: Oral care BID    Recommendations for follow up therapy are one component of a multi-disciplinary discharge planning process, led by the attending physician.  Recommendations may be updated based on patient status, additional functional criteria and insurance authorization.  Follow up Recommendations Follow physician's recommendations for discharge plan and follow up therapies      Assistance Recommended at Discharge Frequent or constant Supervision/Assistance  Functional Status Assessment Patient has had a recent decline in their functional status and/or demonstrates limited ability to make significant improvements in function in a reasonable and predictable amount of time  Frequency and Duration min 1 x/week  1 week       Prognosis Prognosis for Safe Diet Advancement: Fair Barriers to Reach Goals: Cognitive deficits;Time post onset      Swallow Study   General Date of Onset: 04/26/22 HPI: 81 year old female with dementia admitted from [place] care unit with blood in adult diaper. Chest x-ray showed evidence of diffuse bilateral infiltrates patient started on Rocephin azithromycin plan to admit for community-acquired pneumonia treatment.  Patient has already been receiving Paxlovid since positive COVID test in August.  Noted to have elevated white blood cell count worrisome for postviral pneumonia. Past medical history significant of dementia hypertension hyperlipidemia CKD, gout, complete AV block status post pacemaker  Carotid stenosis, HOH, glaucoma. Swallow eval ordered by admitting MD.  Pt has cervical osteophytes at C5-C6. Type of Study: Bedside Swallow Evaluation Diet Prior to this Study: Regular;Thin liquids (carb mod) History of Recent Intubation: No Behavior/Cognition: Alert Oral Cavity Assessment: Within Functional Limits Oral Care Completed by SLP: No Oral Cavity - Dentition: Edentulous Vision: Functional for  self-feeding Self-Feeding Abilities: Able to feed self Patient Positioning: Upright in bed Baseline Vocal Quality: Low vocal intensity Volitional Cough: Cognitively unable to elicit Volitional Swallow: Unable to elicit    Oral/Motor/Sensory Function Overall Oral Motor/Sensory Function: Within functional limits   Ice Chips Ice chips: Not tested   Thin Liquid Thin Liquid: Impaired Presentation: Cup;Self Fed;Straw Pharyngeal  Phase Impairments: Cough - Immediate Other Comments: cough x1 when consuming coffee via cup - coffee was hot and suspect she inhaled it; small single sips without coughing; she did not pass 3 ounce yale x2 attempts due to needing rest break    Nectar Thick Nectar Thick Liquid: Not tested   Honey Thick Honey Thick Liquid: Not tested   Puree Puree: Within functional limits Presentation: Self Fed;Spoon   Solid     Solid: Impaired Presentation: Self Fed Oral Phase Functional Implications: Prolonged oral transit;Impaired mastication;Oral residue (minimally prolonged, but pt is edentulolus) Other Comments: oral retention x1 on right lateral sulci that she cleared independently      Macario Golds 04/26/2022,11:47 AM   Kathleen Lime, MS [place] Office [phone] Pager [phone]

## 2022-04-26 NOTE — Progress Notes (Signed)
Inpatient Diabetes Program Recommendations  AACE/ADA: New Consensus Statement on Inpatient Glycemic Control (2015)  Target Ranges:  Prepandial:   less than 140 mg/dL      Peak postprandial:   less than 180 mg/dL (1-2 hours)      Critically ill patients:  140 - 180 mg/dL   Lab Results  Component Value Date   GLUCAP 113 (H) 04/26/2022   HGBA1C 8.5 (H) 04/25/2022    Review of Glycemic Control  Diabetes history: DM2 Outpatient Diabetes medications: Lantus 15 QD, previously on metformin  Current orders for Inpatient glycemic control: None  HgbA1C - 8.5%  Inpatient Diabetes Program Recommendations:    Novolog 0-6 TID  Would not send home on OHAs or insulin.  Continue to follow glucose trends while inpatient.  Thank you. Lorenda Peck, RD, LDN, [place] Inpatient Diabetes Coordinator [phone]

## 2022-04-26 NOTE — Progress Notes (Signed)
BLE venous duplex has been completed.   Results can be found under chart review under CV PROC. 04/26/2022 10:19 AM Caylin Nass RVT, RDMS

## 2022-04-26 NOTE — Consult Note (Signed)
[facility] Nurse Consult Note: Reason for Consult: multiple pressure injuries Wound type:  Sacrum: Unstageable pressure injury: 1cm x 1cm x 0.1cm ; 80% yellow/20% pink Right ischium: Unstageable pressure injury: 2cm x 1cm x 0.1cm; 90% yellow/10% pink Left ischium: 0.4cm x 0.3cm x 0.1cm; 100% pink, moist  Right heel: Unstageable Pressure injury: 4cm x 5cm x 0cm; 100% intact black eschar Pressure Injury POA: Yes/No/NA Measurement: see above  Wound bed: see above  Drainage (amount, consistency, odor) scant from sacrum and ischial wounds, none from the heel  Periwound:intact  Dressing procedure/placement/frequency: Prevalon boots bilaterally to offload heels Low air loss mattress for moisture management and pressure redistribution Medihoney to the right ischial and the sacral wound daily for debridement Foam to the left ischium for protection, insulation Incontinence being managed by external urinary device  Discussed POC with bedside nurse.  Re consult if needed, will not follow at this time. Thanks  Betzabe Bevans [employer], RN,CWOCN, CNS, [place] [phone])

## 2022-04-26 NOTE — Progress Notes (Signed)
Echocardiogram 2D Echocardiogram has been performed.  Gail Lynch 04/26/2022, 2:19 PM

## 2022-04-26 NOTE — Evaluation (Signed)
Physical Therapy Evaluation Patient Details Name: Gail Lynch MRN: [id number] DOB: 12/19/1940 Today's Date: 04/26/2022  History of Present Illness  81 yo female admitted with Pna, COVID, staff at ALF found blood in patient's adult diaper. Hx of hearing loss, DM, pacemaker, dementia, COVID, gout, CKD  Clinical Impression  On eval, pt required Mod A +2 for bed mobility and Min A for ambulation to/from bathroom with use of a RW. She requires increased time and multimodal cueing for some tasks. She tolerated activity well. Assisted pt back to bed at end of session. No family was present during session. Will plan to follow pt and progress activity as safely able.        Recommendations for follow up therapy are one component of a multi-disciplinary discharge planning process, led by the attending physician.  Recommendations may be updated based on patient status, additional functional criteria and insurance authorization.  Follow Up Recommendations Skilled nursing-short term rehab (<3 hours/day) (unless ALF can provide currentl level of assistance) Can patient physically be transported by private vehicle: Yes    Assistance Recommended at Discharge Frequent or constant Supervision/Assistance  Patient can return home with the following  A little help with walking and/or transfers;A little help with bathing/dressing/bathroom;Assistance with cooking/housework;Assist for transportation;Help with stairs or ramp for entrance    Equipment Recommendations None recommended by PT  Recommendations for Other Services       Functional Status Assessment Patient has had a recent decline in their functional status and demonstrates the ability to make significant improvements in function in a reasonable and predictable amount of time.     Precautions / Restrictions Precautions Precautions: Fall Restrictions Weight Bearing Restrictions: No      Mobility  Bed Mobility Overal bed mobility: Needs  Assistance Bed Mobility: Supine to Sit Rolling: Min assist Sidelying to sit: Mod assist, +2 for physical assistance, +2 for safety/equipment, HOB elevated   Sit to supine: Mod assist   General bed mobility comments: Assist for trunk and bil LEs. Utilized bedpad to assist with scooting, positioning. Increased time and multimodal cueing required    Transfers Overall transfer level: Needs assistance Equipment used: Rolling walker (2 wheels) Transfers: Sit to/from Stand Sit to Stand: Mod assist           General transfer comment: Assist to power up, steady, control descent. Cues and increased time required.    Ambulation/Gait Ambulation/Gait assistance: Min assist Gait Distance (Feet): 15 Feet (x2) Assistive device: Rolling walker (2 wheels) Gait Pattern/deviations: Step-through pattern, Decreased stride length, Trunk flexed       General Gait Details: Intermittent assist to manage RW but mostly Min guard for ambulation to/from bathroom using RW. Cues and increased time required.  Stairs            Wheelchair Mobility    Modified Rankin (Stroke Patients Only)       Balance Overall balance assessment: Needs assistance Sitting-balance support: Bilateral upper extremity supported, Feet supported Sitting balance-Leahy Scale: Fair     Standing balance support: Bilateral upper extremity supported, During functional activity Standing balance-Leahy Scale: Poor                               Pertinent Vitals/Pain Pain Assessment Pain Assessment: Faces Faces Pain Scale: Hurts a little bit Pain Location: buttocks Pain Descriptors / Indicators: Discomfort, Sore Pain Intervention(s): Limited activity within patient's tolerance, Monitored during session, Repositioned    Home Living Family/patient expects  to be discharged to:: Unsure                 Home Equipment: Rolling Walker (2 wheels);Cane - single point Additional Comments: Per daughter, pt  resides at [facility] in the memory care unit. Some info taken from chart review as well.    Prior Function Prior Level of Function : Needs assist;Patient poor historian/Family not available             Mobility Comments: per chart review, pt was ambulatory with a RW ADLs Comments: unsure of ADL asisst level     Hand Dominance        Extremity/Trunk Assessment   Upper Extremity Assessment Upper Extremity Assessment: Defer to OT evaluation    Lower Extremity Assessment Lower Extremity Assessment: Generalized weakness       Communication   Communication: HOH  Cognition Arousal/Alertness: Awake/alert Behavior During Therapy: WFL for tasks assessed/performed Overall Cognitive Status: History of cognitive impairments - at baseline                                 General Comments: requires cueing and increased time. she follows 1 step commands.        General Comments      Exercises     Assessment/Plan    PT Assessment Patient needs continued PT services  PT Problem List Decreased strength;Decreased mobility;Decreased range of motion;Decreased activity tolerance;Decreased balance;Decreased knowledge of use of DME;Decreased cognition;Decreased safety awareness       PT Treatment Interventions DME instruction;Gait training;Therapeutic activities;Patient/family education;Therapeutic exercise;Balance training;Functional mobility training    PT Goals (Current goals can be found in the Care Plan section)  Acute Rehab PT Goals Patient Stated Goal: pt unable to state PT Goal Formulation: Patient unable to participate in goal setting Time For Goal Achievement: 05/10/22 Potential to Achieve Goals: Fair    Frequency Min 3X/week     Co-evaluation               AM-PAC PT "6 Clicks" Mobility  Outcome Measure Help needed turning from your back to your side while in a flat bed without using bedrails?: A Little Help needed moving from lying  on your back to sitting on the side of a flat bed without using bedrails?: A Lot Help needed moving to and from a bed to a chair (including a wheelchair)?: A Little Help needed standing up from a chair using your arms (e.g., wheelchair or bedside chair)?: A Little Help needed to walk in hospital room?: A Little Help needed climbing 3-5 steps with a railing? : Total 6 Click Score: 15    End of Session   Activity Tolerance: Patient tolerated treatment well Patient left: in bed;with call bell/phone within reach;with bed alarm set   PT Visit Diagnosis: Muscle weakness (generalized) (M62.81);Difficulty in walking, not elsewhere classified (R26.2)    Time: 3382-5053 PT Time Calculation (min) (ACUTE ONLY): 30 min   Charges:   PT Evaluation $PT Eval Moderate Complexity: 1 Mod PT Treatments $Gait Training: 8-22 mins           Doreatha Massed, PT Acute Rehabilitation  Office: [phone] Pager: [phone]

## 2022-04-26 NOTE — Progress Notes (Signed)
PROGRESS NOTE    Gail Lynch  DJS:[id number] DOB: [birth year] DOA: 04/25/2022 PCP: Pcp, No    Brief Narrative:  81 y.o. female with medical history significant of dementia hypertension hyperlipidemia CKD, gout, complete AV block status post pacemaker. She was admitted for post COVID-pneumonia   Assessment and Plan: Hypokalemia -replete   CAP (community acquired pneumonia)  - -Patient presenting with  productive cough, and infiltrate on chest x-ray -Infiltrate on CXR and 2-3 characteristics (fever, leukocytosis, purulent sputum) are consistent with pneumonia. -This appears to be most likely community-acquired pneumonia. post viral given recent COVId infection   - sputum cultures,     Anemia -Blood in diaper was due to decub ulcer -heme negative   Diabetes ([place])  - Order Sensitive SSI      CKD (chronic kidney disease), stage IIIa (HCC)  -chronic  -stable   Elevated D-dimer after 5 Dopplar negative -V/Q and echo pending   Decubitus ulcer on sacrum and heel -WOC consult  DVT prophylaxis: SCDs Start: 04/26/22 0221    Code Status: Full Code Family Communication:   Disposition Plan:  Level of care: Telemetry Status is: Observation The patient will require care spanning > 2 midnights and should be moved to inpatient because: needs IV abx    Consultants:  woc   Subjective: No complaints  Objective: Vitals:   04/26/22 0430 04/26/22 0743 04/26/22 0931 04/26/22 1156  BP: (!) 153/78 (!) 148/73  (!) 138/52  Pulse: 82 85 83 69  Resp: '15 16  14  '$ Temp: 98.4 F (36.9 C) 97.8 F (36.6 C)  97.6 F (36.4 C)  TempSrc: Oral Oral  Axillary  SpO2: 97% 97% 99% 100%  Weight:      Height:        Intake/Output Summary (Last 24 hours) at 04/26/2022 1258 Last data filed at 04/26/2022 1156 Gross per 24 hour  Intake 3295.77 ml  Output 50 ml  Net 3245.77 ml   Filed Weights   04/25/22 2357  Weight: 69.5 kg    Examination:   General: Appearance:     Overweight  female in no acute distress     Lungs:     respirations unlabored  Heart:    Normal heart rate. Normal rhythm.    MS:   All extremities are intact.    Neurologic:   Awake, alert       Data Reviewed: I have personally reviewed following labs and imaging studies  CBC: Recent Labs  Lab 04/25/22 1145 04/25/22 2005 04/26/22 0406  WBC 19.8* 14.6* 12.1*  NEUTROABS 16.0* 11.2* 9.7*  HGB 10.0* 8.7* 9.9*  HCT 31.9* 26.8* 30.0*  MCV 88.6 87.0 90.6  PLT 261 259 885   Basic Metabolic Panel: Recent Labs  Lab 04/25/22 1145 04/25/22 2005 04/26/22 0406  NA 142 142 143  K 3.4* 3.4* 3.5  CL 106 109 110  CO2 '27 25 25  '$ GLUCOSE 136* 66* 149*  BUN 48* 39* 36*  CREATININE 1.86* 1.47* 1.34*  CALCIUM 8.6* 8.3* 8.4*  MG  --  1.6* 1.8  PHOS  --  2.9 3.1   GFR: Estimated Creatinine Clearance: 27.7 mL/min (A) (by C-G formula based on SCr of 1.34 mg/dL (H)). Liver Function Tests: Recent Labs  Lab 04/25/22 1145 04/26/22 0406  AST 21 18  ALT 14 11  ALKPHOS 87 71  BILITOT 0.6 0.4  PROT 6.4* 5.5*  ALBUMIN 2.2* 2.0*   No results for input(s): "LIPASE", "AMYLASE" in the last 168 hours. No results for  input(s): "AMMONIA" in the last 168 hours. Coagulation Profile: Recent Labs  Lab 04/25/22 1145  INR 1.2   Cardiac Enzymes: Recent Labs  Lab 04/25/22 2005  CKTOTAL 88   BNP (last 3 results) No results for input(s): "PROBNP" in the last 8760 hours. HbA1C: Recent Labs    04/25/22 1145  HGBA1C 8.5*   CBG: Recent Labs  Lab 04/25/22 2244 04/26/22 0058 04/26/22 0428 04/26/22 0741 04/26/22 1152  GLUCAP 125* 153* 123* 113* 150*   Lipid Profile: No results for input(s): "CHOL", "HDL", "LDLCALC", "TRIG", "CHOLHDL", "LDLDIRECT" in the last 72 hours. Thyroid Function Tests: Recent Labs    04/25/22 1145  TSH 2.912   Anemia Panel: Recent Labs    04/25/22 1145 04/25/22 2005 04/26/22 0406  VITAMINB12  --  447  --   FOLATE  --  9.9  --   FERRITIN 673*  --  481*  TIBC   --  126*  --   IRON  --  38  --   RETICCTPCT 1.0  --   --    Sepsis Labs: Recent Labs  Lab 04/25/22 1358 04/25/22 2005  PROCALCITON  --  21.84  LATICACIDVEN 1.0  --     Recent Results (from the past 240 hour(s))  Resp Panel by RT-PCR (Flu A&B, Covid) Urine, Catheterized     Status: Abnormal   Collection Time: 04/25/22  1:58 PM   Specimen: Urine, Catheterized; Nasal Swab  Result Value Ref Range Status   SARS Coronavirus 2 by RT PCR POSITIVE (A) NEGATIVE Final    Comment: (NOTE) SARS-CoV-2 target nucleic acids are DETECTED.  The SARS-CoV-2 RNA is generally detectable in upper respiratory specimens during the acute phase of infection. Positive results are indicative of the presence of the identified virus, but do not rule out bacterial infection or co-infection with other pathogens not detected by the test. Clinical correlation with patient history and other diagnostic information is necessary to determine patient infection status. The expected result is Negative.  Fact Sheet for Patients: EntrepreneurPulse.com.au  Fact Sheet for Healthcare Providers: IncredibleEmployment.be  This test is not yet approved or cleared by the [place] FDA and  has been authorized for detection and/or diagnosis of SARS-CoV-2 by FDA under an Emergency Use Authorization (EUA).  This EUA will remain in effect (meaning this test can be used) for the duration of  the COVID-19 declaration under Section 564(b)(1) of the A ct, 21 U.S.C. section 360bbb-3(b)(1), unless the authorization is terminated or revoked sooner.     Influenza A by PCR NEGATIVE NEGATIVE Final   Influenza B by PCR NEGATIVE NEGATIVE Final    Comment: (NOTE) The Xpert Xpress SARS-CoV-2/FLU/RSV plus assay is intended as an aid in the diagnosis of influenza from Nasopharyngeal swab specimens and should not be used as a sole basis for treatment. Nasal washings and aspirates are unacceptable  for Xpert Xpress SARS-CoV-2/FLU/RSV testing.  Fact Sheet for Patients: EntrepreneurPulse.com.au  Fact Sheet for Healthcare Providers: IncredibleEmployment.be  This test is not yet approved or cleared by the [place] FDA and has been authorized for detection and/or diagnosis of SARS-CoV-2 by FDA under an Emergency Use Authorization (EUA). This EUA will remain in effect (meaning this test can be used) for the duration of the COVID-19 declaration under Section 564(b)(1) of the Act, 21 U.S.C. section 360bbb-3(b)(1), unless the authorization is terminated or revoked.  Performed at [facility], [place] [address], [place], [place] [address]   Blood Culture (routine x 2)  Status: None (Preliminary result)   Collection Time: 04/25/22  1:58 PM   Specimen: BLOOD  Result Value Ref Range Status   Specimen Description   Final    BLOOD BLOOD LEFT FOREARM Performed at [facility] [address], [place], [place] [address]    Special Requests   Final    BOTTLES DRAWN AEROBIC AND ANAEROBIC Blood Culture adequate volume Performed at [facility] [address] [place], [place] [address]    Culture   Final    NO GROWTH < 12 HOURS Performed at [facility] [address], [place], [place] [address]    Report Status PENDING  Incomplete  Blood Culture (routine x 2)     Status: None (Preliminary result)   Collection Time: 04/25/22  2:03 PM   Specimen: BLOOD  Result Value Ref Range Status   Specimen Description   Final    BLOOD BLOOD RIGHT FOREARM Performed at [facility] [address] [place], [place] [address]    Special Requests   Final    BOTTLES DRAWN AEROBIC AND ANAEROBIC Blood Culture adequate volume Performed at [facility] [address] [place], [place] [address]    Culture   Final    NO GROWTH < 12 HOURS Performed at [facility] [address] [place], [place] [address]    Report Status PENDING  Incomplete  Urine Culture     Status: None   Collection Time: 04/25/22  2:39 PM   Specimen: In/Out Cath Urine  Result Value Ref Range Status   Specimen Description   Final    IN/OUT CATH URINE Performed at [facility] [address] [place], [place] [address]    Special Requests   Final    NONE Performed at [facility], [place] [address] [place], [place] [address]    Culture   Final    NO GROWTH Performed at [facility], [place] [address] [place], [place] [address]    Report Status 04/26/2022 FINAL  Final  Expectorated Sputum Assessment w Gram Stain, Rflx to Resp Cult     Status: None   Collection Time: 04/25/22  8:20 PM   Specimen: Sputum  Result Value Ref Range Status   Specimen Description SPUTUM  Final   Special Requests NONE  Final   Sputum evaluation   Final    THIS SPECIMEN IS ACCEPTABLE FOR SPUTUM CULTURE Performed at [facility], [place] [address], [place], [place] [address]    Report Status 04/25/2022 FINAL  Final  Culture, Respiratory w Gram Stain     Status: None (Preliminary result)   Collection Time: 04/25/22  8:20 PM   Specimen: SPU  Result Value Ref Range Status   Specimen Description   Final    SPUTUM Performed at [facility] [address] [place], [place] [address]    Special Requests   Final    NONE Reflexed from [id number] Performed at [facility], [place] [address] [place], [place] [address]    Gram Stain   Final    FEW WBC PRESENT,BOTH PMN AND MONONUCLEAR RARE SQUAMOUS EPITHELIAL CELLS PRESENT FEW GRAM POSITIVE COCCI IN PAIRS FEW GRAM POSITIVE COCCI IN CLUSTERS Performed at [facility], [place] [address], [place], [place] [address]    Culture PENDING  Incomplete   Report Status PENDING  Incomplete  Radiology Studies: VAS [place] LOWER EXTREMITY VENOUS  (DVT)  Result Date: 04/26/2022  Lower Venous DVT Study Patient Name:  Gail Lynch  Date of Exam:   04/26/2022 Medical Rec #: [id number]      Accession #:    [id number] Date of Birth: [birth year]      Patient Gender: F Patient Age:   28 years Exam Location:  [facility] Procedure:      VAS [place] LOWER EXTREMITY VENOUS (DVT) Referring Phys: Nyoka Lint DOUTOVA --------------------------------------------------------------------------------  Indications: Elevated D-dimer (6.99).  Risk Factors: Immobility COVID+. Limitations: Poor ultrasound/tissue interface and poor patient positioning (lying on side). Comparison Study: No previous exams Performing Technologist: Jody Hill RVT, RDMS  Examination Guidelines: A complete evaluation includes B-mode imaging, spectral Doppler, color Doppler, and power Doppler as needed of all accessible portions of each vessel. Bilateral testing is considered an integral part of a complete examination. Limited examinations for reoccurring indications may be performed as noted. The reflux portion of the exam is performed with the patient in reverse Trendelenburg.  +---------+---------------+---------+-----------+----------+-------------------+ RIGHT    CompressibilityPhasicitySpontaneityPropertiesThrombus Aging      +---------+---------------+---------+-----------+----------+-------------------+ CFV      Full           Yes      Yes                                      +---------+---------------+---------+-----------+----------+-------------------+ SFJ      Full                                                             +---------+---------------+---------+-----------+----------+-------------------+ FV Prox  Full           Yes      Yes                                      +---------+---------------+---------+-----------+----------+-------------------+ FV Mid   Full           Yes      Yes                                       +---------+---------------+---------+-----------+----------+-------------------+ FV DistalFull           Yes      Yes                                      +---------+---------------+---------+-----------+----------+-------------------+ PFV      Full                                                             +---------+---------------+---------+-----------+----------+-------------------+ POP      Full           Yes      Yes                                      +---------+---------------+---------+-----------+----------+-------------------+  PTV                                                   Not well visualized +---------+---------------+---------+-----------+----------+-------------------+ PERO     Full                                                             +---------+---------------+---------+-----------+----------+-------------------+ Difficulty visualizing RLE due to patient lying on left side  +---------+---------------+---------+-----------+----------+--------------+ LEFT     CompressibilityPhasicitySpontaneityPropertiesThrombus Aging +---------+---------------+---------+-----------+----------+--------------+ CFV      Full           Yes      Yes                                 +---------+---------------+---------+-----------+----------+--------------+ SFJ      Full                                                        +---------+---------------+---------+-----------+----------+--------------+ FV Prox  Full           Yes      Yes                                 +---------+---------------+---------+-----------+----------+--------------+ FV Mid   Full           Yes      Yes                                 +---------+---------------+---------+-----------+----------+--------------+ FV DistalFull           Yes      Yes                                 +---------+---------------+---------+-----------+----------+--------------+ PFV       Full                                                        +---------+---------------+---------+-----------+----------+--------------+ POP      Full           Yes      Yes                                 +---------+---------------+---------+-----------+----------+--------------+ PTV                                                   Not visualized +---------+---------------+---------+-----------+----------+--------------+ PERO  Full                                                        +---------+---------------+---------+-----------+----------+--------------+   Left Technical Findings: Not visualized segments include posterior tibial veins.   Summary: BILATERAL: - No evidence of deep vein thrombosis seen in the lower extremities, bilaterally. -No evidence of popliteal cyst, bilaterally.   *See table(s) above for measurements and observations.    Preliminary    CT ABDOMEN PELVIS WO CONTRAST  Result Date: 04/25/2022 CLINICAL DATA:  Rectal versus vaginal bleeding, leukocytosis, dementia EXAM: CT ABDOMEN AND PELVIS WITHOUT CONTRAST TECHNIQUE: Multidetector CT imaging of the abdomen and pelvis was performed following the standard protocol without IV contrast. RADIATION DOSE REDUCTION: This exam was performed according to the departmental dose-optimization program which includes automated exposure control, adjustment of the mA and/or kV according to patient size and/or use of iterative reconstruction technique. COMPARISON:  03/04/2014 FINDINGS: Lower chest: BILATERAL lower lobe consolidation with additional infiltrate in RIGHT middle lobe. Central peribronchial thickening. Pacemaker leads RIGHT atrium and RIGHT ventricle. Mitral annular calcification. Hepatobiliary: Cholelithiasis.  Liver unremarkable. Pancreas: Atrophic pancreas without mass Spleen: Normal appearance Adrenals/Urinary Tract: Adrenal thickening without mass. Kidneys normal appearance though minimal perinephric stranding  is present bilaterally. No ureteral calcification or dilatation. Bladder unremarkable. Stomach/Bowel: Normal appendix. Minimal sigmoid diverticulosis. No CT evidence of diverticulitis. Stomach and remaining bowel loops unremarkable. Vascular/Lymphatic: Atherosclerotic calcifications aorta, iliac arteries, femoral arteries. Aorta normal caliber. No adenopathy. Reproductive: Uterus and ovaries grossly unremarkable. Other: No free air or free fluid.  No hernia. Musculoskeletal: Bones demineralized. Mild degenerative disc and facet disease changes lumbar spine. Old appearing superior endplate height loss of T12 unchanged. IMPRESSION: Minimal sigmoid diverticulosis without evidence of diverticulitis. Cholelithiasis. Mild BILATERAL perinephric stranding, recommend correlation with urinalysis to exclude urinary tract infection. BILATERAL lower lobe and RIGHT middle lobe consolidation consistent with pneumonia. Aortic Atherosclerosis (ICD10-I70.0). Electronically Signed   By: Lavonia Dana M.D.   On: 04/25/2022 17:19   DG Chest Port 1 View  Result Date: 04/25/2022 CLINICAL DATA:  Questionable sepsis, rectal and vaginal bleeding EXAM: PORTABLE CHEST 1 VIEW COMPARISON:  04/16/2022 FINDINGS: Cardiomegaly with left chest multi lead pacer. Mild, diffuse bilateral interstitial pulmonary opacity. The visualized skeletal structures are unremarkable. IMPRESSION: Cardiomegaly with mild, diffuse bilateral interstitial pulmonary opacity, likely edema. No focal airspace opacity. Electronically Signed   By: Delanna Ahmadi M.D.   On: 04/25/2022 15:28        Scheduled Meds:  albuterol  2 puff Inhalation TID   docusate sodium  100 mg Oral BID   feeding supplement  237 mL Oral BID BM   QUEtiapine  25 mg Oral QHS   risperiDONE  2 mg Oral BID   rosuvastatin  40 mg Oral Daily   senna  1 tablet Oral BID   Continuous Infusions:  azithromycin     cefTRIAXone (ROCEPHIN)  IV       LOS: 0 days    Time spent: 45 minutes spent on  chart review, discussion with nursing staff, consultants, updating family and interview/physical exam; more than 50% of that time was spent in counseling and/or coordination of care.    Geradine Girt, DO [facility] Available via Epic secure chat 7am-7pm After these hours, please refer to coverage provider listed on [email or web address] 04/26/2022, 12:58  PM

## 2022-04-27 DIAGNOSIS — N1831 Chronic kidney disease, stage 3a: Secondary | ICD-10-CM | POA: Diagnosis not present

## 2022-04-27 DIAGNOSIS — E11649 Type 2 diabetes mellitus with hypoglycemia without coma: Secondary | ICD-10-CM | POA: Diagnosis not present

## 2022-04-27 DIAGNOSIS — F03C11 Unspecified dementia, severe, with agitation: Secondary | ICD-10-CM | POA: Diagnosis not present

## 2022-04-27 DIAGNOSIS — J189 Pneumonia, unspecified organism: Secondary | ICD-10-CM | POA: Diagnosis not present

## 2022-04-27 LAB — FERRITIN: Ferritin: 353 ng/mL — ABNORMAL HIGH (ref 11–307)

## 2022-04-27 LAB — CBC WITH DIFFERENTIAL/PLATELET
Abs Immature Granulocytes: 0.05 10*3/uL (ref 0.00–0.07)
Basophils Absolute: 0 10*3/uL (ref 0.0–0.1)
Basophils Relative: 0 %
Eosinophils Absolute: 0.1 10*3/uL (ref 0.0–0.5)
Eosinophils Relative: 1 %
HCT: 27.6 % — ABNORMAL LOW (ref 36.0–46.0)
Hemoglobin: 9.3 g/dL — ABNORMAL LOW (ref 12.0–15.0)
Immature Granulocytes: 1 %
Lymphocytes Relative: 15 %
Lymphs Abs: 1.3 10*3/uL (ref 0.7–4.0)
MCH: 30.8 pg (ref 26.0–34.0)
MCHC: 33.7 g/dL (ref 30.0–36.0)
MCV: 91.4 fL (ref 80.0–100.0)
Monocytes Absolute: 0.8 10*3/uL (ref 0.1–1.0)
Monocytes Relative: 9 %
Neutro Abs: 6.4 10*3/uL (ref 1.7–7.7)
Neutrophils Relative %: 74 %
Platelets: 314 10*3/uL (ref 150–400)
RBC: 3.02 MIL/uL — ABNORMAL LOW (ref 3.87–5.11)
RDW: 13.9 % (ref 11.5–15.5)
WBC: 8.6 10*3/uL (ref 4.0–10.5)
nRBC: 0 % (ref 0.0–0.2)

## 2022-04-27 LAB — COMPREHENSIVE METABOLIC PANEL
ALT: 9 U/L (ref 0–44)
AST: 16 U/L (ref 15–41)
Albumin: 1.9 g/dL — ABNORMAL LOW (ref 3.5–5.0)
Alkaline Phosphatase: 66 U/L (ref 38–126)
Anion gap: 7 (ref 5–15)
BUN: 24 mg/dL — ABNORMAL HIGH (ref 8–23)
CO2: 26 mmol/L (ref 22–32)
Calcium: 8.2 mg/dL — ABNORMAL LOW (ref 8.9–10.3)
Chloride: 110 mmol/L (ref 98–111)
Creatinine, Ser: 1.03 mg/dL — ABNORMAL HIGH (ref 0.44–1.00)
GFR, Estimated: 54 mL/min — ABNORMAL LOW (ref >60)
Glucose, Bld: 105 mg/dL — ABNORMAL HIGH (ref 70–99)
Potassium: 3.6 mmol/L (ref 3.5–5.1)
Sodium: 143 mmol/L (ref 135–145)
Total Bilirubin: 0.4 mg/dL (ref 0.3–1.2)
Total Protein: 5.6 g/dL — ABNORMAL LOW (ref 6.5–8.1)

## 2022-04-27 LAB — C-REACTIVE PROTEIN: CRP: 7.5 mg/dL — ABNORMAL HIGH (ref <1.0)

## 2022-04-27 LAB — MAGNESIUM: Magnesium: 1.6 mg/dL — ABNORMAL LOW (ref 1.7–2.4)

## 2022-04-27 LAB — PHOSPHORUS: Phosphorus: 3 mg/dL (ref 2.5–4.6)

## 2022-04-27 LAB — GLUCOSE, CAPILLARY
Glucose-Capillary: 85 mg/dL (ref 70–99)
Glucose-Capillary: 128 mg/dL — ABNORMAL HIGH (ref 70–99)
Glucose-Capillary: 205 mg/dL — ABNORMAL HIGH (ref 70–99)
Glucose-Capillary: 109 mg/dL — ABNORMAL HIGH (ref 70–99)

## 2022-04-27 LAB — D-DIMER, QUANTITATIVE: D-Dimer, Quant: 7 ug/mL-FEU — ABNORMAL HIGH (ref 0.00–0.50)

## 2022-04-27 MED ORDER — ENOXAPARIN SODIUM 40 MG/0.4ML IJ SOSY
40.0000 mg | PREFILLED_SYRINGE | INTRAMUSCULAR | Status: DC
  Administered 2022-04-27 – 2022-04-30 (×4): 40 mg via SUBCUTANEOUS
  Filled 2022-04-27 (×4): qty 0.4

## 2022-04-27 MED ORDER — AZITHROMYCIN 250 MG PO TABS
500.0000 mg | ORAL_TABLET | Freq: Every day | ORAL | Status: AC
  Administered 2022-04-27 – 2022-04-29 (×3): 500 mg via ORAL
  Filled 2022-04-27 (×3): qty 2

## 2022-04-27 MED ORDER — MAGNESIUM SULFATE 2 GM/50ML IV SOLN
2.0000 g | Freq: Once | INTRAVENOUS | Status: AC
  Administered 2022-04-27: 2 g via INTRAVENOUS
  Filled 2022-04-27: qty 50

## 2022-04-27 NOTE — Progress Notes (Signed)
Speech Language Pathology Treatment: Dysphagia  Patient Details Name: Gail Lynch MRN: [id number] DOB: [birth year] Today's Date: 04/27/2022 Time: 1205-1227 SLP Time Calculation (min) (ACUTE ONLY): 22 min  Assessment / Plan / Recommendation Clinical Impression  Pt able to feed herself today, sitting upright in chair- and appears comfortable. RN reports pt sleepy in the morning - no breakfast consumed.  Meal included pot roast, carrots, mashed potatoes, tea and magic milkshake.  Pt able to "masticate" and swallow without clinical indication of aspiration.   She does tend to stuff her mouth without swallowing first bolus- but as protective of her airway.   RN reports pt tolerating medications with applesauce.  Pt likely with baseline level of swallow at this time - due to edentulous status, recommedn continue dys3/thin diet with set up, assuring pt is self feeding for tolerance.  No SLP follow up indicated as all education completed for dysphagia mitigation and pt with good intake.    HPI HPI: 81 year old female with dementia admitted from [facility] care unit with blood in adult diaper. Chest x-ray showed evidence of diffuse bilateral infiltrates patient started on Rocephin azithromycin plan to admit for community-acquired pneumonia treatment.  Patient has already been receiving Paxlovid since positive COVID test in August.  Noted to have elevated white blood cell count worrisome for postviral pneumonia. Past medical history significant of dementia hypertension hyperlipidemia CKD, gout, complete AV block status post pacemaker  Carotid stenosis, HOH, glaucoma. Swallow eval ordered by admitting MD.  Pt has cervical osteophytes at C5-C6. Given pt has h/o dementia and cervical osteopohytes with current pna, follow up re: swallow function indicated.  Caught pt consuming her lunch at this time.      SLP Plan  All goals met      Recommendations for follow up therapy are one component of a  multi-disciplinary discharge planning process, led by the attending physician.  Recommendations may be updated based on patient status, additional functional criteria and insurance authorization.    Recommendations  Diet recommendations: Dysphagia 3 (mechanical soft);Thin liquid Liquids provided via: Cup;Straw Medication Administration: Whole meds with puree Supervision: Staff to assist with self feeding Compensations: Slow rate;Small sips/bites Postural Changes and/or Swallow Maneuvers: Seated upright 90 degrees;Upright 30-60 min after meal                Oral Care Recommendations: Oral care BID Follow Up Recommendations: Follow physician's recommendations for discharge plan and follow up therapies Assistance recommended at discharge: Frequent or constant Supervision/Assistance SLP Visit Diagnosis: Dysphagia, unspecified (R13.10) Plan: All goals met         Kathleen Lime, MS [facility] Office [phone] Pager [phone]   Macario Golds  04/27/2022, 12:35 PM

## 2022-04-27 NOTE — TOC Initial Note (Signed)
Transition of Care [facility]) - Initial/Assessment Note    Patient Details  Name: Gail Lynch MRN: [id number] Date of Birth: [birth year]  [facility] CM/SW Contact:    Gail Kaufman, RN Phone Number: 04/27/2022, 1:32 PM  Clinical Narrative:     Patient from ALF: [facility] This RNCM left message for Gail Lynch patient's legal guardian, awaiting a call back.               Expected Discharge Plan: Skilled Nursing Facility Barriers to Discharge: Continued Medical Work up   Patient Goals and CMS Choice Patient states their goals for this hospitalization and ongoing recovery are:: unable to assess      Expected Discharge Plan and Services Expected Discharge Plan: [place] In-house Referral: NA Discharge Planning Services: CM Consult   Living arrangements for the past 2 months: [place]                 DME Arranged: N/A DME Agency: NA       HH Arranged: NA [place] Agency: NA        Prior Living Arrangements/Services Living arrangements for the past 2 months: [place] Lives with:: Facility Resident          Need for Family Participation in Patient Care: Yes (Comment) Care giver support system in place?: No (comment) Current home services: DME (walker) Criminal Activity/Legal Involvement Pertinent to Current Situation/Hospitalization: No - Comment as needed  Activities of Daily Living Home Assistive Devices/Equipment: Environmental consultant (specify type) ADL Screening (condition at time of admission) Patient's cognitive ability adequate to safely complete daily activities?: No Is the patient deaf or have difficulty hearing?: No Does the patient have difficulty seeing, even when wearing glasses/contacts?: No Does the patient have difficulty concentrating, remembering, or making decisions?: Yes Patient able to express need for assistance with ADLs?: Yes Does the patient have difficulty dressing or bathing?:  Yes Independently performs ADLs?: No Communication: Independent Dressing (OT): Needs assistance Is this a change from baseline?: Pre-admission baseline Grooming: Needs assistance Is this a change from baseline?: Pre-admission baseline Feeding: Independent Bathing: Needs assistance Is this a change from baseline?: Pre-admission baseline Toileting: Needs assistance Is this a change from baseline?: Pre-admission baseline In/Out Bed: Needs assistance Is this a change from baseline?: Pre-admission baseline Walks in Home: Needs assistance Is this a change from baseline?: Pre-admission baseline Does the patient have difficulty walking or climbing stairs?: Yes Weakness of Legs: Both Weakness of Arms/Hands: Both  Permission Sought/Granted Permission sought to share information with : Case Manager Permission granted to share information with : Yes, Verbal Permission Granted  Share Information with NAME: Case Manager           Emotional Assessment Appearance:: Appears stated age Attitude/Demeanor/Rapport: Gracious Affect (typically observed): Unable to Assess Orientation: : Oriented to Self Alcohol / Substance Use: Not Applicable Psych Involvement: No (comment)  Admission diagnosis:  CAP (community acquired pneumonia) [J18.9] Pneumonia of both lungs due to infectious organism, unspecified part of lung [J18.9] Pneumonia [J18.9] Patient Active Problem List   Diagnosis Date Noted   Pneumonia 04/26/2022   Hypokalemia 04/25/2022   COVID-19 virus infection 04/25/2022   CAP (community acquired pneumonia) 04/25/2022   Anemia 04/25/2022   AKI (acute kidney injury) ([place]) 04/25/2022   Sacral wound 04/25/2022   Living in nursing home 09/13/2021   Involuntary commitment 09/13/2021   Severe CADASIL dementia with agitation ([place]) 09/13/2021   Dementia ([place]) 07/04/2021   Non compliance with medical treatment  07/31/2020   Psychosis in elderly ([place]) 07/29/2020   Diabetic leg ulcer ([place])  07/29/2020   Benign neoplasm of ascending colon    Benign neoplasm of transverse colon    Benign neoplasm of descending colon    Benign neoplasm of sigmoid colon    History of colon polyps 05/04/2020   Complete AV block ([place]) 03/09/2020   Mild aortic stenosis 03/14/2017   Peripheral edema 03/14/2017   Arm heaviness 03/14/2017   CKD (chronic kidney disease), stage III ([place]) 05/20/2016   Uncontrolled type 2 diabetes mellitus with hypoglycemia, with long-term current use of insulin ([place]) 01/03/2016   Dysuria 12/09/2014   Peripheral neuropathy 12/05/2013   Irregular heart beat 12/05/2013   PVC (premature ventricular contraction) 12/05/2013   Sciatica 06/17/2013   Pain due to onychomycosis of toenail 05/21/2013   Mycotic toenails    Vagina bleeding 08/06/2012   Carotid stenosis 06/18/2012   Constipation 04/30/2012   Childhood asthma 12/18/2011   Glaucoma 12/18/2011   Hyperlipidemia 12/18/2011   Colon polyps 12/18/2011   Preventative health care 12/17/2011   Hypertension 12/17/2011   Hard of hearing    Dysplasia of cervix, low grade (CIN 1)    HPV (human papilloma virus) infection    Brain mass 09/18/2011   PCP:  Pcp, No Pharmacy:   [facility] ([place]),  - [address] [place] ([place])  [address] Phone: [phone] Fax: [phone]     Social Determinants of Health ([place]) Interventions    Readmission Risk Interventions     No data to display

## 2022-04-27 NOTE — Progress Notes (Signed)
     Referral received for Gail Lynch re: goals of care discussion. Chart reviewed and updates received from RN. Patient assessed and is unable to engage appropriately in discussions. Attempted to contact patient's legal guardian. Unable to reach. HIPAA appropriate voicemail left with PMT contact information given.   PMT will re-attempt to contact family at a later time/date. Detailed note and recommendations to follow once GOC has been completed.   Thank you for your referral and allowing PMT to assist in Mrs. Gail Lynch care.   Jordan Hawks, FNP-BC Palliative Medicine Team  Phone: [phone]  NO CHARGE

## 2022-04-27 NOTE — Progress Notes (Addendum)
PROGRESS NOTE    Nayelis Bonito  NLZ:[id number] DOB: 06-18-1941 DOA: 04/25/2022 PCP: Pcp, No    Brief Narrative:  81 y.o. female with medical history significant of dementia hypertension hyperlipidemia CKD, gout, complete AV block status post pacemaker. She was admitted for post COVID-pneumonia.  Return to ALF vs SNF   Assessment and Plan: Hypokalemia -replete   CAP (community acquired pneumonia)  - -Patient presenting with  productive cough, and infiltrate on chest x-ray -Infiltrate on CXR and 2-3 characteristics (fever, leukocytosis, purulent sputum) are consistent with pneumonia. -This appears to be most likely community-acquired pneumonia. post viral given recent COVId infection   -abx  Hypomagnesemia -replete    Anemia -Blood in diaper was due to decub ulcer -heme negative -CBC daily   Diabetes ([place])  - Order Sensitive SSI      AKI on CKD (chronic kidney disease), stage IIIa (HCC)  -chronic  -stable   Elevated D-dimer after 5 Dopplar negative -V/Q negative for PE -echo: Left ventricular ejection fraction, by estimation, is 50 to 55%. The  left ventricle has low normal function. The left ventricle demonstrates  global hypokinesis. Left ventricular diastolic function could not be  evaluated. Elevated left atrial  pressure.    Decubitus pressure ulcer -WOC consult -POA -Sacrum: Unstageable pressure injury: 1cm x 1cm x 0.1cm ; 80% yellow/20% pink Right ischium: Unstageable pressure injury: 2cm x 1cm x 0.1cm; 90% yellow/10% pink Left ischium: 0.4cm x 0.3cm x 0.1cm; 100% pink, moist Right heel: Unstageable Pressure injury: 4cm x 5cm x 0cm; 100% intact black eschar Pressure Injury POA: Yes/No/NA"    DVT prophylaxis: SCDs Start: 04/26/22 0221    Code Status: Full Code Family Communication: called legal guardian  Disposition Plan:  Level of care: Telemetry Status is: inpt  Consultants:  woc   Subjective: Feeling better  Objective: Vitals:   04/26/22  1156 04/26/22 2001 04/26/22 2001 04/27/22 0700  BP: (!) 138/52 (!) 133/57 (!) 133/57 135/60  Pulse: 69 73 73 75  Resp: '14 19 19   '$ Temp: 97.6 F (36.4 C) 97.7 F (36.5 C) 97.7 F (36.5 C) 97.8 F (36.6 C)  TempSrc: Axillary Oral Oral Oral  SpO2: 100% 100% 100% 100%  Weight:      Height:        Intake/Output Summary (Last 24 hours) at 04/27/2022 1213 Last data filed at 04/26/2022 2000 Gross per 24 hour  Intake 350 ml  Output 50 ml  Net 300 ml   Filed Weights   04/25/22 2357  Weight: 69.5 kg    Examination:    General: Appearance:     Overweight female in no acute distress     Lungs:     respirations unlabored  Heart:    Normal heart rate.   MS:   All extremities are intact.   Neurologic:   Awake, alert         Data Reviewed: I have personally reviewed following labs and imaging studies  CBC: Recent Labs  Lab 04/25/22 1145 04/25/22 2005 04/26/22 0406 04/27/22 0435  WBC 19.8* 14.6* 12.1* 8.6  NEUTROABS 16.0* 11.2* 9.7* 6.4  HGB 10.0* 8.7* 9.9* 9.3*  HCT 31.9* 26.8* 30.0* 27.6*  MCV 88.6 87.0 90.6 91.4  PLT 261 259 299 902   Basic Metabolic Panel: Recent Labs  Lab 04/25/22 1145 04/25/22 2005 04/26/22 0406 04/27/22 0435  NA 142 142 143 143  K 3.4* 3.4* 3.5 3.6  CL 106 109 110 110  CO2 '27 25 25 26  '$ GLUCOSE  136* 66* 149* 105*  BUN 48* 39* 36* 24*  CREATININE 1.86* 1.47* 1.34* 1.03*  CALCIUM 8.6* 8.3* 8.4* 8.2*  MG  --  1.6* 1.8 1.6*  PHOS  --  2.9 3.1 3.0   GFR: Estimated Creatinine Clearance: 36 mL/min (A) (by C-G formula based on SCr of 1.03 mg/dL (H)). Liver Function Tests: Recent Labs  Lab 04/25/22 1145 04/26/22 0406 04/27/22 0435  AST '21 18 16  '$ ALT '14 11 9  '$ ALKPHOS 87 71 66  BILITOT 0.6 0.4 0.4  PROT 6.4* 5.5* 5.6*  ALBUMIN 2.2* 2.0* 1.9*   No results for input(s): "LIPASE", "AMYLASE" in the last 168 hours. No results for input(s): "AMMONIA" in the last 168 hours. Coagulation Profile: Recent Labs  Lab 04/25/22 1145  INR 1.2    Cardiac Enzymes: Recent Labs  Lab 04/25/22 2005  CKTOTAL 88   BNP (last 3 results) No results for input(s): "PROBNP" in the last 8760 hours. HbA1C: Recent Labs    04/25/22 1145  HGBA1C 8.5*   CBG: Recent Labs  Lab 04/26/22 0741 04/26/22 1152 04/26/22 1647 04/26/22 2201 04/27/22 0800  GLUCAP 113* 150* 190* 116* 85   Lipid Profile: No results for input(s): "CHOL", "HDL", "LDLCALC", "TRIG", "CHOLHDL", "LDLDIRECT" in the last 72 hours. Thyroid Function Tests: Recent Labs    04/25/22 1145  TSH 2.912   Anemia Panel: Recent Labs    04/25/22 1145 04/25/22 2005 04/26/22 0406 04/27/22 0435  VITAMINB12  --  447  --   --   FOLATE  --  9.9  --   --   FERRITIN 673*  --  481* 353*  TIBC  --  126*  --   --   IRON  --  38  --   --   RETICCTPCT 1.0  --   --   --    Sepsis Labs: Recent Labs  Lab 04/25/22 1358 04/25/22 2005  PROCALCITON  --  21.84  LATICACIDVEN 1.0  --     Recent Results (from the past 240 hour(s))  Resp Panel by RT-PCR (Flu A&B, Covid) Urine, Catheterized     Status: Abnormal   Collection Time: 04/25/22  1:58 PM   Specimen: Urine, Catheterized; Nasal Swab  Result Value Ref Range Status   SARS Coronavirus 2 by RT PCR POSITIVE (A) NEGATIVE Final    Comment: (NOTE) SARS-CoV-2 target nucleic acids are DETECTED.  The SARS-CoV-2 RNA is generally detectable in upper respiratory specimens during the acute phase of infection. Positive results are indicative of the presence of the identified virus, but do not rule out bacterial infection or co-infection with other pathogens not detected by the test. Clinical correlation with patient history and other diagnostic information is necessary to determine patient infection status. The expected result is Negative.  Fact Sheet for Patients: EntrepreneurPulse.com.au  Fact Sheet for Healthcare Providers: IncredibleEmployment.be  This test is not yet approved or cleared by  the [place] FDA and  has been authorized for detection and/or diagnosis of SARS-CoV-2 by FDA under an Emergency Use Authorization (EUA).  This EUA will remain in effect (meaning this test can be used) for the duration of  the COVID-19 declaration under Section 564(b)(1) of the A ct, 21 U.S.C. section 360bbb-3(b)(1), unless the authorization is terminated or revoked sooner.     Influenza A by PCR NEGATIVE NEGATIVE Final   Influenza B by PCR NEGATIVE NEGATIVE Final    Comment: (NOTE) The Xpert Xpress SARS-CoV-2/FLU/RSV plus assay is intended as an aid in  the diagnosis of influenza from Nasopharyngeal swab specimens and should not be used as a sole basis for treatment. Nasal washings and aspirates are unacceptable for Xpert Xpress SARS-CoV-2/FLU/RSV testing.  Fact Sheet for Patients: EntrepreneurPulse.com.au  Fact Sheet for Healthcare Providers: IncredibleEmployment.be  This test is not yet approved or cleared by the [place] FDA and has been authorized for detection and/or diagnosis of SARS-CoV-2 by FDA under an Emergency Use Authorization (EUA). This EUA will remain in effect (meaning this test can be used) for the duration of the COVID-19 declaration under Section 564(b)(1) of the Act, 21 U.S.C. section 360bbb-3(b)(1), unless the authorization is terminated or revoked.  Performed at [facility], [place] [address], [place], [place] [address]   Blood Culture (routine x 2)     Status: None (Preliminary result)   Collection Time: 04/25/22  1:58 PM   Specimen: BLOOD  Result Value Ref Range Status   Specimen Description   Final    BLOOD BLOOD LEFT FOREARM Performed at [facility] [address] [place], [place] [address]    Special Requests   Final    BOTTLES DRAWN AEROBIC AND ANAEROBIC Blood Culture adequate volume Performed at [facility] [address], [place],  [place] [address]    Culture   Final    NO GROWTH 2 DAYS Performed at [facility] [address], [place], [place] [address]    Report Status PENDING  Incomplete  Blood Culture (routine x 2)     Status: None (Preliminary result)   Collection Time: 04/25/22  2:03 PM   Specimen: BLOOD  Result Value Ref Range Status   Specimen Description   Final    BLOOD BLOOD RIGHT FOREARM Performed at [facility] [address], [place], [place] [address]    Special Requests   Final    BOTTLES DRAWN AEROBIC AND ANAEROBIC Blood Culture adequate volume Performed at [facility] [address] [place], [place] [address]    Culture   Final    NO GROWTH 2 DAYS Performed at [facility] [address] [place], [place] [address]    Report Status PENDING  Incomplete  Urine Culture     Status: None   Collection Time: 04/25/22  2:39 PM   Specimen: In/Out Cath Urine  Result Value Ref Range Status   Specimen Description   Final    IN/OUT CATH URINE Performed at [facility] [address], [facility], [place] [address]    Special Requests   Final    NONE Performed at [facility], [place] [address] [place], [place] [address]    Culture   Final    NO GROWTH Performed at [facility], [place] [address] [place],  [address]    Report Status 04/26/2022 FINAL  Final  Expectorated Sputum Assessment w Gram Stain, Rflx to Resp Cult     Status: None   Collection Time: 04/25/22  8:20 PM   Specimen: Sputum  Result Value Ref Range Status   Specimen Description SPUTUM  Final   Special Requests NONE  Final   Sputum evaluation   Final    THIS SPECIMEN IS ACCEPTABLE FOR SPUTUM CULTURE Performed at [facility], [place] [address], [place],  [address]    Report Status 04/25/2022 FINAL  Final  Culture, Respiratory w Gram Stain     Status: None (Preliminary result)   Collection  Time: 04/25/22  8:20  PM   Specimen: SPU  Result Value Ref Range Status   Specimen Description   Final    SPUTUM Performed at [facility] [address], [place], [place] [address]    Special Requests   Final    NONE Reflexed from [id number] Performed at [facility], [place] [address], [place], [place] [address]    Gram Stain   Final    FEW WBC PRESENT,BOTH PMN AND MONONUCLEAR RARE SQUAMOUS EPITHELIAL CELLS PRESENT FEW GRAM POSITIVE COCCI IN PAIRS FEW GRAM POSITIVE COCCI IN CLUSTERS    Culture   Final    CULTURE REINCUBATED FOR BETTER GROWTH Performed at [facility], [place] [address] [place], [place] [address]    Report Status PENDING  Incomplete         Radiology Studies: VAS [place] LOWER EXTREMITY VENOUS (DVT)  Result Date: 04/26/2022  Lower Venous DVT Study Patient Name:  MAYRE BURY  Date of Exam:   04/26/2022 Medical Rec #: [id number]      Accession #:    [id number] Date of Birth: 05/27/1941      Patient Gender: F Patient Age:   89 years Exam Location:  [facility] Procedure:      VAS [place] LOWER EXTREMITY VENOUS (DVT) Referring Phys: Nyoka Lint DOUTOVA --------------------------------------------------------------------------------  Indications: Elevated D-dimer (6.99).  Risk Factors: Immobility COVID+. Limitations: Poor ultrasound/tissue interface and poor patient positioning (lying on side). Comparison Study: No previous exams Performing Technologist: Jody Hill RVT, RDMS  Examination Guidelines: A complete evaluation includes B-mode imaging, spectral Doppler, color Doppler, and power Doppler as needed of all accessible portions of each vessel. Bilateral testing is considered an integral part of a complete examination. Limited examinations for reoccurring indications may be performed as noted. The reflux portion of the exam is performed with the patient in reverse Trendelenburg.  +---------+---------------+---------+-----------+----------+-------------------+  RIGHT    CompressibilityPhasicitySpontaneityPropertiesThrombus Aging      +---------+---------------+---------+-----------+----------+-------------------+ CFV      Full           Yes      Yes                                      +---------+---------------+---------+-----------+----------+-------------------+ SFJ      Full                                                             +---------+---------------+---------+-----------+----------+-------------------+ FV Prox  Full           Yes      Yes                                      +---------+---------------+---------+-----------+----------+-------------------+ FV Mid   Full           Yes      Yes                                      +---------+---------------+---------+-----------+----------+-------------------+ FV DistalFull           Yes  Yes                                      +---------+---------------+---------+-----------+----------+-------------------+ PFV      Full                                                             +---------+---------------+---------+-----------+----------+-------------------+ POP      Full           Yes      Yes                                      +---------+---------------+---------+-----------+----------+-------------------+ PTV                                                   Not well visualized +---------+---------------+---------+-----------+----------+-------------------+ PERO     Full                                                             +---------+---------------+---------+-----------+----------+-------------------+ Difficulty visualizing RLE due to patient lying on left side  +---------+---------------+---------+-----------+----------+--------------+ LEFT     CompressibilityPhasicitySpontaneityPropertiesThrombus Aging +---------+---------------+---------+-----------+----------+--------------+ CFV      Full           Yes       Yes                                 +---------+---------------+---------+-----------+----------+--------------+ SFJ      Full                                                        +---------+---------------+---------+-----------+----------+--------------+ FV Prox  Full           Yes      Yes                                 +---------+---------------+---------+-----------+----------+--------------+ FV Mid   Full           Yes      Yes                                 +---------+---------------+---------+-----------+----------+--------------+ FV DistalFull           Yes      Yes                                 +---------+---------------+---------+-----------+----------+--------------+ PFV      Full                                                        +---------+---------------+---------+-----------+----------+--------------+  POP      Full           Yes      Yes                                 +---------+---------------+---------+-----------+----------+--------------+ PTV                                                   Not visualized +---------+---------------+---------+-----------+----------+--------------+ PERO     Full                                                        +---------+---------------+---------+-----------+----------+--------------+   Left Technical Findings: Not visualized segments include posterior tibial veins.   Summary: BILATERAL: - No evidence of deep vein thrombosis seen in the lower extremities, bilaterally. -No evidence of popliteal cyst, bilaterally.   *See table(s) above for measurements and observations. Electronically signed by Harold Barban MD on 04/26/2022 at 10:32:49 PM.    Final    ECHOCARDIOGRAM COMPLETE  Result Date: 04/26/2022    ECHOCARDIOGRAM REPORT   Patient Name:   JOHNNAE IMPASTATO Date of Exam: 04/26/2022 Medical Rec #:  [id number]     Height:       60.0 in Accession #:    [id number]    Weight:       153.3 lb Date of  Birth:  1941-07-16     BSA:          1.667 m Patient Age:    34 years      BP:           138/52 mmHg Patient Gender: F             HR:           79 bpm. Exam Location:  Inpatient Procedure: 2D Echo, Cardiac Doppler and Color Doppler Indications:    Congestive Heart Failure I50.9  History:        Patient has prior history of Echocardiogram examinations, most                 recent 03/09/2020. Risk Factors:Hypertension, Dyslipidemia and                 Diabetes.  Sonographer:    Bernadene Person RDCS Referring Phys: [place]  1. Left ventricular ejection fraction, by estimation, is 50 to 55%. The left ventricle has low normal function. The left ventricle demonstrates global hypokinesis. Left ventricular diastolic function could not be evaluated. Elevated left atrial pressure.  2. Right ventricular systolic function is normal. The right ventricular size is normal. There is normal pulmonary artery systolic pressure.  3. Left atrial size was mildly dilated.  4. The mitral valve is normal in structure. No evidence of mitral valve regurgitation. No evidence of mitral stenosis. Moderate mitral annular calcification.  5. The aortic valve is calcified. There is mild thickening of the aortic valve. Aortic valve regurgitation is not visualized. Mild to moderate aortic valve stenosis. Aortic valve area, by VTI measures 1.18 cm. Aortic valve mean gradient measures 12.3 mmHg. Aortic valve Vmax measures 2.32 m/s.  6.  The inferior vena cava is normal in size with greater than 50% respiratory variability, suggesting right atrial pressure of 3 mmHg. FINDINGS  Left Ventricle: Left ventricular ejection fraction, by estimation, is 50 to 55%. The left ventricle has low normal function. The left ventricle demonstrates global hypokinesis. The left ventricular internal cavity size was normal in size. There is no left ventricular hypertrophy. Left ventricular diastolic function could not be evaluated due to mitral annular  calcification (moderate or greater). Left ventricular diastolic function could not be evaluated. Elevated left atrial pressure. Right Ventricle: The right ventricular size is normal. No increase in right ventricular wall thickness. Right ventricular systolic function is normal. There is normal pulmonary artery systolic pressure. The tricuspid regurgitant velocity is 2.05 m/s, and  with an assumed right atrial pressure of 8 mmHg, the estimated right ventricular systolic pressure is 11.9 mmHg. Left Atrium: Left atrial size was mildly dilated. Right Atrium: Right atrial size was normal in size. Pericardium: There is no evidence of pericardial effusion. Mitral Valve: The mitral valve is normal in structure. Moderate mitral annular calcification. No evidence of mitral valve regurgitation. No evidence of mitral valve stenosis. Tricuspid Valve: The tricuspid valve is normal in structure. Tricuspid valve regurgitation is not demonstrated. No evidence of tricuspid stenosis. Aortic Valve: The aortic valve is calcified. There is mild thickening of the aortic valve. There is mild aortic valve annular calcification. Aortic valve regurgitation is not visualized. Mild to moderate aortic stenosis is present. Aortic valve mean gradient measures 12.3 mmHg. Aortic valve peak gradient measures 21.5 mmHg. Aortic valve area, by VTI measures 1.18 cm. Pulmonic Valve: The pulmonic valve was normal in structure. Pulmonic valve regurgitation is not visualized. No evidence of pulmonic stenosis. Aorta: The aortic root is normal in size and structure. Venous: The inferior vena cava is normal in size with greater than 50% respiratory variability, suggesting right atrial pressure of 3 mmHg. IAS/Shunts: No atrial level shunt detected by color flow Doppler. Additional Comments: A device lead is visualized.  LEFT VENTRICLE PLAX 2D LVIDd:         4.40 cm     Diastology LVIDs:         2.90 cm     LV e' medial:    3.60 cm/s LV PW:         0.80 cm      LV E/e' medial:  39.2 LV IVS:        0.70 cm     LV e' lateral:   5.96 cm/s LVOT diam:     1.90 cm     LV E/e' lateral: 23.7 LV SV:         66 LV SV Index:   39 LVOT Area:     2.84 cm  LV Volumes (MOD) LV vol d, MOD A2C: 91.6 ml LV vol d, MOD A4C: 73.6 ml LV vol s, MOD A2C: 42.0 ml LV vol s, MOD A4C: 33.7 ml LV SV MOD A2C:     49.6 ml LV SV MOD A4C:     73.6 ml LV SV MOD BP:      44.3 ml RIGHT VENTRICLE RV S prime:     9.81 cm/s TAPSE (M-mode): 2.0 cm LEFT ATRIUM             Index        RIGHT ATRIUM           Index LA diam:        3.60 cm 2.16 cm/m  RA Area:     15.90 cm LA Vol (A2C):   50.4 ml 30.23 ml/m  RA Volume:   31.50 ml  18.89 ml/m LA Vol (A4C):   51.5 ml 30.89 ml/m LA Biplane Vol: 56.0 ml 33.59 ml/m  AORTIC VALVE AV Area (Vmax):    1.32 cm AV Area (Vmean):   1.24 cm AV Area (VTI):     1.18 cm AV Vmax:           231.67 cm/s AV Vmean:          165.667 cm/s AV VTI:            0.560 m AV Peak Grad:      21.5 mmHg AV Mean Grad:      12.3 mmHg LVOT Vmax:         108.00 cm/s LVOT Vmean:        72.200 cm/s LVOT VTI:          0.232 m LVOT/AV VTI ratio: 0.41  AORTA Ao Root diam: 2.90 cm Ao Asc diam:  3.30 cm MITRAL VALVE                TRICUSPID VALVE MV Area (PHT): 2.99 cm     TR Peak grad:   16.8 mmHg MV Decel Time: 254 msec     TR Vmax:        205.00 cm/s MV E velocity: 141.00 cm/s MV A velocity: 153.00 cm/s  SHUNTS MV E/A ratio:  0.92         Systemic VTI:  0.23 m                             Systemic Diam: 1.90 cm Kardie Tobb DO Electronically signed by Berniece Salines DO Signature Date/Time: 04/26/2022/2:24:07 PM    Final    NM Pulmonary Perf and Vent  Result Date: 04/26/2022 CLINICAL DATA:  Pulmonary embolism suspected, high probability EXAM: NUCLEAR MEDICINE PERFUSION LUNG SCAN TECHNIQUE: Perfusion images were obtained in multiple projections after intravenous injection of radiopharmaceutical. Ventilation scans intentionally deferred if perfusion scan and chest x-ray adequate for interpretation  during COVID 19 epidemic. RADIOPHARMACEUTICALS:  4.07 mCi Tc-70mMAA IV COMPARISON:  Portable chest radiographs 04/25/2022 and 03/27/2022. Abdominal CT 04/25/2022. FINDINGS: There is patchy decreased perfusion to both lung bases corresponding with basilar airspace opacities on CT. No upper lobe or segmental perfusion defects are demonstrated to suggest pulmonary embolism. IMPRESSION: No evidence of acute pulmonary embolism on perfusion scintigraphy by PISAPED criteria. Assessment limited by known bibasilar airspace opacities on abdominal CT. If patient unable to receive intravenous contrast for pulmonary arterial CTA, consider further evaluation with lower extremity venous Doppler ultrasound. Electronically Signed   By: WRichardean SaleM.D.   On: 04/26/2022 13:09   CT ABDOMEN PELVIS WO CONTRAST  Result Date: 04/25/2022 CLINICAL DATA:  Rectal versus vaginal bleeding, leukocytosis, dementia EXAM: CT ABDOMEN AND PELVIS WITHOUT CONTRAST TECHNIQUE: Multidetector CT imaging of the abdomen and pelvis was performed following the standard protocol without IV contrast. RADIATION DOSE REDUCTION: This exam was performed according to the departmental dose-optimization program which includes automated exposure control, adjustment of the mA and/or kV according to patient size and/or use of iterative reconstruction technique. COMPARISON:  03/04/2014 FINDINGS: Lower chest: BILATERAL lower lobe consolidation with additional infiltrate in RIGHT middle lobe. Central peribronchial thickening. Pacemaker leads RIGHT atrium and RIGHT ventricle. Mitral annular calcification. Hepatobiliary: Cholelithiasis.  Liver unremarkable. Pancreas: Atrophic pancreas without mass  Spleen: Normal appearance Adrenals/Urinary Tract: Adrenal thickening without mass. Kidneys normal appearance though minimal perinephric stranding is present bilaterally. No ureteral calcification or dilatation. Bladder unremarkable. Stomach/Bowel: Normal appendix. Minimal  sigmoid diverticulosis. No CT evidence of diverticulitis. Stomach and remaining bowel loops unremarkable. Vascular/Lymphatic: Atherosclerotic calcifications aorta, iliac arteries, femoral arteries. Aorta normal caliber. No adenopathy. Reproductive: Uterus and ovaries grossly unremarkable. Other: No free air or free fluid.  No hernia. Musculoskeletal: Bones demineralized. Mild degenerative disc and facet disease changes lumbar spine. Old appearing superior endplate height loss of T12 unchanged. IMPRESSION: Minimal sigmoid diverticulosis without evidence of diverticulitis. Cholelithiasis. Mild BILATERAL perinephric stranding, recommend correlation with urinalysis to exclude urinary tract infection. BILATERAL lower lobe and RIGHT middle lobe consolidation consistent with pneumonia. Aortic Atherosclerosis (ICD10-I70.0). Electronically Signed   By: Lavonia Dana M.D.   On: 04/25/2022 17:19   DG Chest Port 1 View  Result Date: 04/25/2022 CLINICAL DATA:  Questionable sepsis, rectal and vaginal bleeding EXAM: PORTABLE CHEST 1 VIEW COMPARISON:  04/16/2022 FINDINGS: Cardiomegaly with left chest multi lead pacer. Mild, diffuse bilateral interstitial pulmonary opacity. The visualized skeletal structures are unremarkable. IMPRESSION: Cardiomegaly with mild, diffuse bilateral interstitial pulmonary opacity, likely edema. No focal airspace opacity. Electronically Signed   By: Delanna Ahmadi M.D.   On: 04/25/2022 15:28        Scheduled Meds:  ascorbic acid  500 mg Oral BID   docusate sodium  100 mg Oral BID   feeding supplement  237 mL Oral BID BM   feeding supplement (GLUCERNA SHAKE)  237 mL Oral TID BM   insulin aspart  0-15 Units Subcutaneous TID WC   insulin aspart  0-5 Units Subcutaneous QHS   leptospermum manuka honey  1 Application Topical Daily   multivitamin with minerals  1 tablet Oral Daily   QUEtiapine  25 mg Oral QHS   risperiDONE  2 mg Oral BID   rosuvastatin  40 mg Oral Daily   senna  1 tablet Oral  BID   zinc sulfate  220 mg Oral Daily   Continuous Infusions:  azithromycin Stopped (04/26/22 1948)   cefTRIAXone (ROCEPHIN)  IV 2 g (04/26/22 1748)     LOS: 1 day    Time spent: 45 minutes spent on chart review, discussion with nursing staff, consultants, updating family and interview/physical exam; more than 50% of that time was spent in counseling and/or coordination of care.    Geradine Girt, DO [facility] Available via Epic secure chat 7am-7pm After these hours, please refer to coverage provider listed on [email or web address] 04/27/2022, 12:13 PM

## 2022-04-27 NOTE — Progress Notes (Signed)
SATURATION QUALIFICATIONS: (This note is used to comply with regulatory documentation for home oxygen)  Patient Saturations on Room Air at Rest = 94%  Patient Saturations on Room Air while Ambulating = 98%  Patient Saturations on N/A Liters of oxygen while Ambulating = N/A%  Please briefly explain why patient needs home oxygen:

## 2022-04-27 NOTE — Progress Notes (Signed)
PHARMACIST - PHYSICIAN COMMUNICATION DR:   Eliseo Squires CONCERNING: Antibiotic IV to Oral Route Change Policy  RECOMMENDATION: This patient is receiving azithromycin by the intravenous route.  Based on criteria approved by the Pharmacy and Therapeutics Committee, the antibiotic(s) is/are being converted to the equivalent oral dose form(s).   DESCRIPTION: These criteria include: Patient being treated for a respiratory tract infection, urinary tract infection, cellulitis or clostridium difficile associated diarrhea if on metronidazole The patient is not neutropenic and does not exhibit a GI malabsorption state The patient is eating (either orally or via tube) and/or has been taking other orally administered medications for a least 24 hours The patient is improving clinically and has a Tmax < 100.5  If you have questions about this conversion, please contact the Pharmacy Department  '[]'$   [phone] )  [facility] '[]'$   [phone] )  [facility]  '[]'$   [phone] )  [facility] '[x]'$   [phone] )  [facility]    Thank you for allowing pharmacy to be a part of this patient's care.  Royetta Asal, PharmD, BCPS Clinical Pharmacist [place] Please utilize Amion for appropriate phone number to reach the unit pharmacist ([place]) 04/27/2022 1:28 PM

## 2022-04-28 DIAGNOSIS — J189 Pneumonia, unspecified organism: Secondary | ICD-10-CM | POA: Diagnosis not present

## 2022-04-28 DIAGNOSIS — E876 Hypokalemia: Secondary | ICD-10-CM | POA: Diagnosis not present

## 2022-04-28 DIAGNOSIS — N1831 Chronic kidney disease, stage 3a: Secondary | ICD-10-CM | POA: Diagnosis not present

## 2022-04-28 DIAGNOSIS — Z515 Encounter for palliative care: Secondary | ICD-10-CM

## 2022-04-28 LAB — CBC WITH DIFFERENTIAL/PLATELET
Abs Immature Granulocytes: 0.04 10*3/uL (ref 0.00–0.07)
Basophils Absolute: 0 10*3/uL (ref 0.0–0.1)
Basophils Relative: 0 %
Eosinophils Absolute: 0.1 10*3/uL (ref 0.0–0.5)
Eosinophils Relative: 1 %
HCT: 28.9 % — ABNORMAL LOW (ref 36.0–46.0)
Hemoglobin: 9.4 g/dL — ABNORMAL LOW (ref 12.0–15.0)
Immature Granulocytes: 1 %
Lymphocytes Relative: 27 %
Lymphs Abs: 1.9 10*3/uL (ref 0.7–4.0)
MCH: 28.8 pg (ref 26.0–34.0)
MCHC: 32.5 g/dL (ref 30.0–36.0)
MCV: 88.7 fL (ref 80.0–100.0)
Monocytes Absolute: 0.8 10*3/uL (ref 0.1–1.0)
Monocytes Relative: 11 %
Neutro Abs: 4.2 10*3/uL (ref 1.7–7.7)
Neutrophils Relative %: 60 %
Platelets: 347 10*3/uL (ref 150–400)
RBC: 3.26 MIL/uL — ABNORMAL LOW (ref 3.87–5.11)
RDW: 13.7 % (ref 11.5–15.5)
WBC: 7.1 10*3/uL (ref 4.0–10.5)
nRBC: 0 % (ref 0.0–0.2)

## 2022-04-28 LAB — COMPREHENSIVE METABOLIC PANEL
ALT: 11 U/L (ref 0–44)
AST: 14 U/L — ABNORMAL LOW (ref 15–41)
Albumin: 1.8 g/dL — ABNORMAL LOW (ref 3.5–5.0)
Alkaline Phosphatase: 61 U/L (ref 38–126)
Anion gap: 8 (ref 5–15)
BUN: 20 mg/dL (ref 8–23)
CO2: 25 mmol/L (ref 22–32)
Calcium: 8.1 mg/dL — ABNORMAL LOW (ref 8.9–10.3)
Chloride: 109 mmol/L (ref 98–111)
Creatinine, Ser: 1.23 mg/dL — ABNORMAL HIGH (ref 0.44–1.00)
GFR, Estimated: 44 mL/min — ABNORMAL LOW (ref >60)
Glucose, Bld: 99 mg/dL (ref 70–99)
Potassium: 3.8 mmol/L (ref 3.5–5.1)
Sodium: 142 mmol/L (ref 135–145)
Total Bilirubin: 0.5 mg/dL (ref 0.3–1.2)
Total Protein: 5.4 g/dL — ABNORMAL LOW (ref 6.5–8.1)

## 2022-04-28 LAB — CULTURE, RESPIRATORY W GRAM STAIN: Culture: NORMAL

## 2022-04-28 LAB — D-DIMER, QUANTITATIVE: D-Dimer, Quant: 5.82 ug/mL-FEU — ABNORMAL HIGH (ref 0.00–0.50)

## 2022-04-28 LAB — MAGNESIUM: Magnesium: 1.8 mg/dL (ref 1.7–2.4)

## 2022-04-28 LAB — GLUCOSE, CAPILLARY
Glucose-Capillary: 94 mg/dL (ref 70–99)
Glucose-Capillary: 169 mg/dL — ABNORMAL HIGH (ref 70–99)
Glucose-Capillary: 161 mg/dL — ABNORMAL HIGH (ref 70–99)

## 2022-04-28 LAB — FERRITIN: Ferritin: 321 ng/mL — ABNORMAL HIGH (ref 11–307)

## 2022-04-28 LAB — PHOSPHORUS: Phosphorus: 2.8 mg/dL (ref 2.5–4.6)

## 2022-04-28 LAB — C-REACTIVE PROTEIN: CRP: 5.3 mg/dL — ABNORMAL HIGH (ref <1.0)

## 2022-04-28 MED ORDER — RISPERIDONE 1 MG/ML PO SOLN
1.0000 mg | Freq: Two times a day (BID) | ORAL | Status: DC
  Administered 2022-04-28 – 2022-05-01 (×6): 1 mg via ORAL
  Filled 2022-04-28 (×6): qty 1

## 2022-04-28 NOTE — TOC Progression Note (Addendum)
Transition of Care [facility]) - Progression Note    Patient Details  Name: Gail Lynch MRN: [id number] Date of Birth: 1940/12/26  Transition of Care [facility]) CM/SW [place], RN Phone Number: 04/28/2022, 10:45 AM  Clinical Narrative:   Patient can return to [facility] who is an ALF they can provide PT services. Patient is also being followed by [facility] for services.   This RNCM left another message with Gail Lynch weeks, and her supervisor Gail Lynch (patient legal guardian), awaiting a call back.  TOC will continue to follow.   - Patient will need air mattress at discharge, unable to set up due to no response from legal guardian. FL2 has been faxed out to [facility].   TOC will continue to follow.  - 2:29p Left another voicemail message for legal guardian DSS supervisor Gail Lynch [phone], awaiting a call back.  - 3:34p Spoke with patient's legal guardian Gail Lynch. Advised patient can be discharged back to [facility] with PT services and [facility] services with [employer]. LG questioning reason for rectal bleeding, per chart review and MD due to wound. Legal Guardian Gail Lynch in agreement with patient going back to ALF.  [facility] does not accept patient's over the weekend. Plan to send to [facility] on Monday. MD notified.  TOC will continue to follow.    Expected Discharge Plan: [place] Barriers to Discharge: Continued Medical Work up  Expected Discharge Plan and Services Expected Discharge Plan: [place] In-house Referral: NA Discharge Planning Services: CM Consult   Living arrangements for the past 2 months: Assisted Living Facility                 DME Arranged: N/A DME Agency: NA       HH Arranged: NA HH Agency: [place] Date [place]: 04/28/22 Time [place]: [id number] Representative spoke with at [place]: [place] Determinants of Health ([place]) Interventions     Readmission Risk Interventions     No data to display

## 2022-04-28 NOTE — NC FL2 (Signed)
[place] LEVEL OF CARE SCREENING TOOL     IDENTIFICATION  Patient Name: Gail Lynch Birthdate: [birth year] Sex: female Admission Date (Current Location): 04/25/2022  [facility] and [place] Number:  Herbalist and Address:  [facility],  [address] [place], [place]      Provider Number: [id number]  Attending Physician Name and Address:  Geradine Girt, DO  Relative Name and Phone Number:  Legal Guardian: Louann Liv [phone]    Current Level of Care: Hospital Recommended Level of Care: [place] Prior Approval Number:    Date Approved/Denied:   PASRR Number: [id number]  Discharge Plan: Other (Comment) ([place])    Current Diagnoses: Patient Active Problem List   Diagnosis Date Noted   Pneumonia 04/26/2022   Hypokalemia 04/25/2022   COVID-19 virus infection 04/25/2022   CAP (community acquired pneumonia) 04/25/2022   Anemia 04/25/2022   AKI (acute kidney injury) ([place]) 04/25/2022   Sacral wound 04/25/2022   Living in nursing home 09/13/2021   Involuntary commitment 09/13/2021   Severe CADASIL dementia with agitation ([place]) 09/13/2021   Dementia ([place]) 07/04/2021   Non compliance with medical treatment 07/31/2020   Psychosis in elderly ([place]) 07/29/2020   Diabetic leg ulcer ([place]) 07/29/2020   Benign neoplasm of ascending colon    Benign neoplasm of transverse colon    Benign neoplasm of descending colon    Benign neoplasm of sigmoid colon    History of colon polyps 05/04/2020   Complete AV block ([place]) 03/09/2020   Mild aortic stenosis 03/14/2017   Peripheral edema 03/14/2017   Arm heaviness 03/14/2017   CKD (chronic kidney disease), stage III ([place]) 05/20/2016   Uncontrolled type 2 diabetes mellitus with hypoglycemia, with long-term current use of insulin ([place]) 01/03/2016   Dysuria 12/09/2014   Peripheral neuropathy 12/05/2013   Irregular heart beat 12/05/2013   PVC (premature  ventricular contraction) 12/05/2013   Sciatica 06/17/2013   Pain due to onychomycosis of toenail 05/21/2013   Mycotic toenails    Vagina bleeding 08/06/2012   Carotid stenosis 06/18/2012   Constipation 04/30/2012   Childhood asthma 12/18/2011   Glaucoma 12/18/2011   Hyperlipidemia 12/18/2011   Colon polyps 12/18/2011   Preventative health care 12/17/2011   Hypertension 12/17/2011   Hard of hearing    Dysplasia of cervix, low grade (CIN 1)    HPV (human papilloma virus) infection    Brain mass 09/18/2011    Orientation RESPIRATION BLADDER Height & Weight     Self  Normal Incontinent Weight: 69.5 kg Height:  5' (152.4 cm) (per daughter)  BEHAVIORAL SYMPTOMS/MOOD NEUROLOGICAL BOWEL NUTRITION STATUS      Incontinent Diet (Dysphagia 3, thin fluids)  AMBULATORY STATUS COMMUNICATION OF NEEDS Skin   Limited Assist Verbally PU Stage and Appropriate Care (Sacrum: Unstageable:1cm x 1cm x 0.1cm;80% yellow/20% pink R ischium:Unstageable pressure injury: 2cm x1cm x0.1cm; 90% yellow/10% pink Lischium: 0.4cm x 0.3cm x 0.1cm; 100% pink, moist R heel: Unstageable:4cm x5cm x 0cm; 100% intact black eschar)                       Personal Care Assistance Level of Assistance  Bathing, Dressing Bathing Assistance: Limited assistance   Dressing Assistance: Limited assistance     Functional Limitations Info  Sight, Hearing, Speech Sight Info: Adequate (eye glasses) Hearing Info: Impaired (HOH) Speech Info: Adequate    SPECIAL CARE FACTORS FREQUENCY  PT (By licensed PT), OT (By licensed OT)  PT Frequency: 5x per week OT Frequency: 5x per week            Contractures Contractures Info: Not present    Additional Factors Info  Code Status, Allergies Code Status Info: Full Allergies Info: Aspirin: BLEEDING BEHIND EYES, Penicillins: unknown reaction           Current Medications (04/28/2022):  This is the current hospital active medication list Current Facility-Administered  Medications  Medication Dose Route Frequency Provider Last Rate Last Admin   acetaminophen (TYLENOL) tablet 650 mg  650 mg Oral Q6H PRN Toy Baker, MD       Or   acetaminophen (TYLENOL) suppository 650 mg  650 mg Rectal Q6H PRN Doutova, Anastassia, MD       albuterol (VENTOLIN HFA) 108 (90 Base) MCG/ACT inhaler 2 puff  2 puff Inhalation Q4H PRN Vann, Jessica U, DO       ascorbic acid (VITAMIN C) tablet 500 mg  500 mg Oral BID Vann, Jessica U, DO   500 mg at 04/28/22 1007   azithromycin (ZITHROMAX) tablet 500 mg  500 mg Oral Daily Suzzanne Cloud, RPH   500 mg at 04/28/22 1007   cefTRIAXone (ROCEPHIN) 2 g in sodium chloride 0.9 % 100 mL IVPB  2 g Intravenous Q24H Doutova, Anastassia, MD 200 mL/hr at 04/27/22 1800 2 g at 04/27/22 1800   chlorpheniramine-HYDROcodone (TUSSIONEX) 10-8 MG/5ML suspension 5 mL  5 mL Oral Q12H PRN Doutova, Nyoka Lint, MD       diazepam (VALIUM) tablet 5 mg  5 mg Oral Q6H PRN Doutova, Anastassia, MD       docusate sodium (COLACE) capsule 100 mg  100 mg Oral BID Doutova, Anastassia, MD   100 mg at 04/28/22 1008   enoxaparin (LOVENOX) injection 40 mg  40 mg Subcutaneous Q24H Vann, Jessica U, DO   40 mg at 04/27/22 1800   feeding supplement (ENSURE ENLIVE / ENSURE PLUS) liquid 237 mL  237 mL Oral BID BM Doutova, Anastassia, MD   237 mL at 04/28/22 1008   feeding supplement (GLUCERNA SHAKE) (GLUCERNA SHAKE) liquid 237 mL  237 mL Oral TID BM Vann, Jessica U, DO   237 mL at 04/28/22 1008   guaiFENesin-dextromethorphan (ROBITUSSIN DM) 100-10 MG/5ML syrup 10 mL  10 mL Oral Q4H PRN Doutova, Anastassia, MD       HYDROcodone-acetaminophen (NORCO/VICODIN) 5-325 MG per tablet 1-2 tablet  1-2 tablet Oral Q4H PRN Doutova, Anastassia, MD       insulin aspart (novoLOG) injection 0-15 Units  0-15 Units Subcutaneous TID WC Vann, Jessica U, DO   5 Units at 04/27/22 1636   insulin aspart (novoLOG) injection 0-5 Units  0-5 Units Subcutaneous QHS Vann, Jessica U, DO       leptospermum  manuka honey (MEDIHONEY) paste 1 Application  1 Application Topical Daily Eulogio Bear U, DO   1 Application at 26/71/24 1347   multivitamin with minerals tablet 1 tablet  1 tablet Oral Daily Eulogio Bear U, DO   1 tablet at 04/28/22 1007   Oral care mouth rinse  15 mL Mouth Rinse PRN Doutova, Anastassia, MD       QUEtiapine (SEROQUEL) tablet 25 mg  25 mg Oral QHS Doutova, Anastassia, MD   25 mg at 04/26/22 2253   risperiDONE (RISPERDAL) 1 MG/ML oral solution 1 mg  1 mg Oral BID Vann, Jessica U, DO       rosuvastatin (CRESTOR) tablet 40 mg  40 mg Oral Daily Toy Baker, MD  40 mg at 04/28/22 1007   senna (SENOKOT) tablet 8.6 mg  1 tablet Oral BID Toy Baker, MD   8.6 mg at 04/28/22 1008   zinc sulfate capsule 220 mg  220 mg Oral Daily Eulogio Bear U, DO   220 mg at 04/28/22 1007     Discharge Medications: Please see discharge summary for a list of discharge medications.  Relevant Imaging Results:  Relevant Lab Results:   Additional Information SSN: [id number]  Roseanne Kaufman, RN

## 2022-04-28 NOTE — Progress Notes (Signed)
Occupational Therapy Treatment Patient Details Name: Gail Lynch MRN: [id number] DOB: 03/20/1941 Today's Date: 04/28/2022   History of present illness 81 yo female admitted with Pna, COVID, staff at ALF found blood in patient's adult diaper. Hx of hearing loss, DM, pacemaker, dementia, COVID, gout, CKD   OT comments  Treatment focused on progressing ADLs. Today patient able to ambulate to bathroom and perform toilet transfer with min guard and RW. Patient able to assist with anterior wiping x 2 - in standing and without upper extremity support. Increased time for ADLs due to two Bms. Patient is progressing well. Left in bed. Patient restless and rolled to her right side due to sacral pain from wound. Daughter in room to assist with feeding task in bed.    Recommendations for follow up therapy are one component of a multi-disciplinary discharge planning process, led by the attending physician.  Recommendations may be updated based on patient status, additional functional criteria and insurance authorization.    Follow Up Recommendations  Skilled nursing-short term rehab (<3 hours/day)    Assistance Recommended at Discharge Frequent or constant Supervision/Assistance  Patient can return home with the following  A lot of help with bathing/dressing/bathroom;Direct supervision/assist for medications management;Direct supervision/assist for financial management;Assist for transportation;Assistance with cooking/housework;Assistance with feeding;Help with stairs or ramp for entrance;A little help with walking and/or transfers   Equipment Recommendations  None recommended by OT    Recommendations for Other Services      Precautions / Restrictions Precautions Precautions: Fall Precaution Comments: sacral woud Restrictions Weight Bearing Restrictions: No       Mobility Bed Mobility                    Transfers                         Balance Overall balance assessment:  Needs assistance Sitting-balance support: No upper extremity supported, Feet supported Sitting balance-Leahy Scale: Good     Standing balance support: During functional activity Standing balance-Leahy Scale: Fair                             ADL either performed or assessed with clinical judgement   ADL Overall ADL's : Needs assistance/impaired Eating/Feeding: Bed level;Supervision/ safety;Set up Eating/Feeding Details (indicate cue type and reason): laying on her side in bed due to sacrum pain. Daughter in room to asist Grooming: Set up;Sitting;Wash/dry hands;Wash/dry face Grooming Details (indicate cue type and reason): seated on toilet                 Toilet Transfer: Rolling walker (2 wheels);Cueing for sequencing;Min guard Toilet Transfer Details (indicate cue type and reason): BSC placed over toilet. Min guard to ambulate to bathroom and perform toilet transfer. Performed a total of 3 sit to stands with min guard and walker for toileting tasks. Toileting- Clothing Manipulation and Hygiene: Moderate assistance;Sit to/from stand Toileting - Clothing Manipulation Details (indicate cue type and reason): Patient able to wipe front area twice with two sit to stands. ABle to maintain balance without upper extremity support as well. Needed assistance for perianal care.       General ADL Comments: Min guard to stand and ambulate to bathroom and performing sit to stands with toileting. Mod assist for LEs to return to supine. Patient exhibits restlessness in bed on her back and transitioned to her right side for pain relief.  Extremity/Trunk Assessment              Vision Baseline Vision/History: 3 Glaucoma Ability to See in Adequate Light: 2 Moderately impaired     Perception     Praxis      Cognition Arousal/Alertness: Awake/alert Behavior During Therapy: WFL for tasks assessed/performed Overall Cognitive Status: History of cognitive impairments - at  baseline                                 General Comments: Increased time and verbal cues        Exercises      Shoulder Instructions       General Comments      Pertinent Vitals/ Pain       Pain Assessment Pain Assessment: 0-10 Pain Score: 6  Pain Location: sacrum Pain Descriptors / Indicators: Grimacing, Discomfort, Restless Pain Intervention(s): Monitored during session, Limited activity within patient's tolerance, Repositioned  Home Living                                          Prior Functioning/Environment              Frequency           Progress Toward Goals  OT Goals(current goals can now be found in the care plan section)  Progress towards OT goals: Progressing toward goals  Acute Rehab OT Goals OT Goal Formulation: With patient Time For Goal Achievement: 05/10/22 Potential to Achieve Goals: Good  Plan Discharge plan remains appropriate    Co-evaluation                 AM-PAC OT "6 Clicks" Daily Activity     Outcome Measure   Help from another person eating meals?: A Little Help from another person taking care of personal grooming?: A Little Help from another person toileting, which includes using toliet, bedpan, or urinal?: A Lot Help from another person bathing (including washing, rinsing, drying)?: A Lot Help from another person to put on and taking off regular upper body clothing?: A Little Help from another person to put on and taking off regular lower body clothing?: A Lot 6 Click Score: 15    End of Session Equipment Utilized During Treatment: Rolling walker (2 wheels)  OT Visit Diagnosis: Unsteadiness on feet (R26.81);Muscle weakness (generalized) (M62.81)   Activity Tolerance Patient tolerated treatment well   Patient Left in bed;with nursing/sitter in room;with call bell/phone within reach;with family/visitor present   Nurse Communication  (needs new sacral cover via epic chat)         Time: 5027-7412 OT Time Calculation (min): 32 min  Charges: OT General Charges $OT Visit: 1 Visit OT Treatments $Self Care/Home Management : 23-37 mins  Gustavo Lah, OTR/L [place]  Office [phone]   Lenward Chancellor 04/28/2022, 1:37 PM

## 2022-04-28 NOTE — Progress Notes (Addendum)
PROGRESS NOTE    Gail Lynch  ZSW:[id number] DOB: 1941-02-02 DOA: 04/25/2022 PCP: Pcp, No    Brief Narrative:  81 y.o. female with medical history significant of dementia hypertension hyperlipidemia CKD, gout, complete AV block status post pacemaker. She was admitted for post COVID-pneumonia.  Return to ALF vs SNF-- await DDS to return calls   Assessment and Plan: Hypokalemia -replete   CAP (community acquired pneumonia)  - -Patient presenting with  productive cough, and infiltrate on chest x-ray -Infiltrate on CXR and 2-3 characteristics (fever, leukocytosis, purulent sputum) are consistent with pneumonia. -This appears to be most likely community-acquired pneumonia. post viral given recent COVId infection   -abx- change to PO when able to d/c -has been > 10 days since initial diagnosis but will continue isolation for 21 days  Hypomagnesemia -replete    Anemia -Blood in diaper was due to decub ulcer -heme negative   Diabetes ([place])  -  SSI     AKI on CKD (chronic kidney disease), stage IIIa (HCC)  -chronic  -stable   Elevated D-dimer after 5 Dopplar negative -V/Q negative for PE -echo: Left ventricular ejection fraction, by estimation, is 50 to 55%. The  left ventricle has low normal function. The left ventricle demonstrates  global hypokinesis. Left ventricular diastolic function could not be  evaluated. Elevated left atrial  pressure.    Decubitus pressure ulcer -WOC consult -POA -Sacrum: Unstageable pressure injury: 1cm x 1cm x 0.1cm ; 80% yellow/20% pink Right ischium: Unstageable pressure injury: 2cm x 1cm x 0.1cm; 90% yellow/10% pink Left ischium: 0.4cm x 0.3cm x 0.1cm; 100% pink, moist Right heel: Unstageable Pressure injury: 4cm x 5cm x 0cm; 100% intact black eschar Pressure Injury POA: Yes/No/NA"  Prevalon boots bilaterally to offload heels Low air loss mattress for moisture management and pressure redistribution Medihoney to the right ischial and the  sacral wound daily for debridement Foam to the left ischium for protection, insulation  DVT prophylaxis: enoxaparin (LOVENOX) injection 40 mg Start: 04/27/22 1800 SCDs Start: 04/26/22 0221    Code Status: Full Code Family Communication: daughter (not legal guardian) at bedside  Disposition Plan:  Level of care: Telemetry Status is: inpt  Consultants:  woc   Subjective: No chest pain, no SOB  Objective: Vitals:   04/27/22 0700 04/27/22 1300 04/27/22 2110 04/28/22 0536  BP: 135/60 130/80 132/68 (!) 145/74  Pulse: 75 80 84 91  Resp:   14 16  Temp: 97.8 F (36.6 C) 98 F (36.7 C) 98.1 F (36.7 C) 98 F (36.7 C)  TempSrc: Oral Oral Oral Oral  SpO2: 100% 100% 96% 99%  Weight:      Height:        Intake/Output Summary (Last 24 hours) at 04/28/2022 1148 Last data filed at 04/27/2022 2110 Gross per 24 hour  Intake 240 ml  Output 650 ml  Net -410 ml   Filed Weights   04/25/22 2357  Weight: 69.5 kg    Examination:     General: Appearance:     Overweight female in no acute distress     Lungs:     respirations unlabored  Heart:    Normal heart rate. Normal rhythm.    MS:   All extremities are intact.   Neurologic:   Awake, alert, hard of hearing           Data Reviewed: I have personally reviewed following labs and imaging studies  CBC: Recent Labs  Lab 04/25/22 1145 04/25/22 2005 04/26/22 0406 04/27/22 0435  04/28/22 0447  WBC 19.8* 14.6* 12.1* 8.6 7.1  NEUTROABS 16.0* 11.2* 9.7* 6.4 4.2  HGB 10.0* 8.7* 9.9* 9.3* 9.4*  HCT 31.9* 26.8* 30.0* 27.6* 28.9*  MCV 88.6 87.0 90.6 91.4 88.7  PLT 261 259 299 314 768   Basic Metabolic Panel: Recent Labs  Lab 04/25/22 1145 04/25/22 2005 04/26/22 0406 04/27/22 0435 04/28/22 0447  NA 142 142 143 143 142  K 3.4* 3.4* 3.5 3.6 3.8  CL 106 109 110 110 109  CO2 '27 25 25 26 25  '$ GLUCOSE 136* 66* 149* 105* 99  BUN 48* 39* 36* 24* 20  CREATININE 1.86* 1.47* 1.34* 1.03* 1.23*  CALCIUM 8.6* 8.3* 8.4* 8.2*  8.1*  MG  --  1.6* 1.8 1.6* 1.8  PHOS  --  2.9 3.1 3.0 2.8   GFR: Estimated Creatinine Clearance: 30.1 mL/min (A) (by C-G formula based on SCr of 1.23 mg/dL (H)). Liver Function Tests: Recent Labs  Lab 04/25/22 1145 04/26/22 0406 04/27/22 0435 04/28/22 0447  AST '21 18 16 '$ 14*  ALT '14 11 9 11  '$ ALKPHOS 87 71 66 61  BILITOT 0.6 0.4 0.4 0.5  PROT 6.4* 5.5* 5.6* 5.4*  ALBUMIN 2.2* 2.0* 1.9* 1.8*   No results for input(s): "LIPASE", "AMYLASE" in the last 168 hours. No results for input(s): "AMMONIA" in the last 168 hours. Coagulation Profile: Recent Labs  Lab 04/25/22 1145  INR 1.2   Cardiac Enzymes: Recent Labs  Lab 04/25/22 2005  CKTOTAL 88   BNP (last 3 results) No results for input(s): "PROBNP" in the last 8760 hours. HbA1C: No results for input(s): "HGBA1C" in the last 72 hours.  CBG: Recent Labs  Lab 04/27/22 0800 04/27/22 1219 04/27/22 1636 04/27/22 2108 04/28/22 0815  GLUCAP 85 128* 205* 109* 94   Lipid Profile: No results for input(s): "CHOL", "HDL", "LDLCALC", "TRIG", "CHOLHDL", "LDLDIRECT" in the last 72 hours. Thyroid Function Tests: No results for input(s): "TSH", "T4TOTAL", "FREET4", "T3FREE", "THYROIDAB" in the last 72 hours.  Anemia Panel: Recent Labs    04/25/22 2005 04/26/22 0406 04/27/22 0435 04/28/22 0447  VITAMINB12 447  --   --   --   FOLATE 9.9  --   --   --   FERRITIN  --    < > 353* 321*  TIBC 126*  --   --   --   IRON 38  --   --   --    < > = values in this interval not displayed.   Sepsis Labs: Recent Labs  Lab 04/25/22 1358 04/25/22 2005  PROCALCITON  --  21.84  LATICACIDVEN 1.0  --     Recent Results (from the past 240 hour(s))  Resp Panel by RT-PCR (Flu A&B, Covid) Urine, Catheterized     Status: Abnormal   Collection Time: 04/25/22  1:58 PM   Specimen: Urine, Catheterized; Nasal Swab  Result Value Ref Range Status   SARS Coronavirus 2 by RT PCR POSITIVE (A) NEGATIVE Final    Comment: (NOTE) SARS-CoV-2 target  nucleic acids are DETECTED.  The SARS-CoV-2 RNA is generally detectable in upper respiratory specimens during the acute phase of infection. Positive results are indicative of the presence of the identified virus, but do not rule out bacterial infection or co-infection with other pathogens not detected by the test. Clinical correlation with patient history and other diagnostic information is necessary to determine patient infection status. The expected result is Negative.  Fact Sheet for Patients: EntrepreneurPulse.com.au  Fact Sheet for Healthcare Providers:  IncredibleEmployment.be  This test is not yet approved or cleared by the [place] and  has been authorized for detection and/or diagnosis of SARS-CoV-2 by FDA under an Emergency Use Authorization (EUA).  This EUA will remain in effect (meaning this test can be used) for the duration of  the COVID-19 declaration under Section 564(b)(1) of the A ct, 21 U.S.C. section 360bbb-3(b)(1), unless the authorization is terminated or revoked sooner.     Influenza A by PCR NEGATIVE NEGATIVE Final   Influenza B by PCR NEGATIVE NEGATIVE Final    Comment: (NOTE) The Xpert Xpress SARS-CoV-2/FLU/RSV plus assay is intended as an aid in the diagnosis of influenza from Nasopharyngeal swab specimens and should not be used as a sole basis for treatment. Nasal washings and aspirates are unacceptable for Xpert Xpress SARS-CoV-2/FLU/RSV testing.  Fact Sheet for Patients: EntrepreneurPulse.com.au  Fact Sheet for Healthcare Providers: IncredibleEmployment.be  This test is not yet approved or cleared by the [place] FDA and has been authorized for detection and/or diagnosis of SARS-CoV-2 by FDA under an Emergency Use Authorization (EUA). This EUA will remain in effect (meaning this test can be used) for the duration of the COVID-19 declaration under Section  564(b)(1) of the Act, 21 U.S.C. section 360bbb-3(b)(1), unless the authorization is terminated or revoked.  Performed at [facility], [place] [address] [place], [place] [address]   Blood Culture (routine x 2)     Status: None (Preliminary result)   Collection Time: 04/25/22  1:58 PM   Specimen: BLOOD  Result Value Ref Range Status   Specimen Description   Final    BLOOD BLOOD LEFT FOREARM Performed at [facility] [address], [place], [place] [address]    Special Requests   Final    BOTTLES DRAWN AEROBIC AND ANAEROBIC Blood Culture adequate volume Performed at [facility] [address] [place], [place] [address]    Culture   Final    NO GROWTH 3 DAYS Performed at [facility], [place] [address], [place], [place] [address]    Report Status PENDING  Incomplete  Blood Culture (routine x 2)     Status: None (Preliminary result)   Collection Time: 04/25/22  2:03 PM   Specimen: BLOOD  Result Value Ref Range Status   Specimen Description   Final    BLOOD BLOOD RIGHT FOREARM Performed at [facility] [address] [place], [place] [address]    Special Requests   Final    BOTTLES DRAWN AEROBIC AND ANAEROBIC Blood Culture adequate volume Performed at [place] [address], [place], [place] [address]    Culture   Final    NO GROWTH 3 DAYS Performed at [facility], [place] [address], [place], [place] [address]    Report Status PENDING  Incomplete  Urine Culture     Status: None   Collection Time: 04/25/22  2:39 PM   Specimen: In/Out Cath Urine  Result Value Ref Range Status   Specimen Description   Final    IN/OUT CATH URINE Performed at [facility] [address] [place], [place] [address]    Special Requests   Final    NONE Performed at [facility], [place] [address] [place], [place] [address]    Culture   Final     NO GROWTH Performed at [facility], [place] [address], [place],  [address]  Report Status 04/26/2022 FINAL  Final  Expectorated Sputum Assessment w Gram Stain, Rflx to Resp Cult     Status: None   Collection Time: 04/25/22  8:20 PM   Specimen: Sputum  Result Value Ref Range Status   Specimen Description SPUTUM  Final   Special Requests NONE  Final   Sputum evaluation   Final    THIS SPECIMEN IS ACCEPTABLE FOR SPUTUM CULTURE Performed at [facility], [place] [address] [place], [place] [address]    Report Status 04/25/2022 FINAL  Final  Culture, Respiratory w Gram Stain     Status: None (Preliminary result)   Collection Time: 04/25/22  8:20 PM   Specimen: SPU  Result Value Ref Range Status   Specimen Description   Final    SPUTUM Performed at [facility] [address] [place], [place] [address]    Special Requests   Final    NONE Reflexed from [id number] Performed at [facility], [place] [address] [place], [place] [address]    Gram Stain   Final    FEW WBC PRESENT,BOTH PMN AND MONONUCLEAR RARE SQUAMOUS EPITHELIAL CELLS PRESENT FEW GRAM POSITIVE COCCI IN PAIRS FEW GRAM POSITIVE COCCI IN CLUSTERS    Culture   Final    CULTURE REINCUBATED FOR BETTER GROWTH Performed at [facility], [place] [address], [place], [place] [address]    Report Status PENDING  Incomplete         Radiology Studies: ECHOCARDIOGRAM COMPLETE  Result Date: 04/26/2022    ECHOCARDIOGRAM REPORT   Patient Name:   SHARMAIN LASTRA Date of Exam: 04/26/2022 Medical Rec #:  [id number]     Height:       60.0 in Accession #:    [id number]    Weight:       153.3 lb Date of Birth:  Dec 29, 1940     BSA:          1.667 m Patient Age:    75 years      BP:           138/52 mmHg Patient Gender: F             HR:           79 bpm. Exam Location:  Inpatient Procedure: 2D Echo, Cardiac Doppler and Color Doppler Indications:    Congestive Heart Failure I50.9   History:        Patient has prior history of Echocardiogram examinations, most                 recent 03/09/2020. Risk Factors:Hypertension, Dyslipidemia and                 Diabetes.  Sonographer:    Bernadene Person RDCS Referring Phys: Carl  1. Left ventricular ejection fraction, by estimation, is 50 to 55%. The left ventricle has low normal function. The left ventricle demonstrates global hypokinesis. Left ventricular diastolic function could not be evaluated. Elevated left atrial pressure.  2. Right ventricular systolic function is normal. The right ventricular size is normal. There is normal pulmonary artery systolic pressure.  3. Left atrial size was mildly dilated.  4. The mitral valve is normal in structure. No evidence of mitral valve regurgitation. No evidence of mitral stenosis. Moderate mitral annular calcification.  5. The aortic valve is calcified. There is mild thickening of the aortic valve. Aortic valve regurgitation is not visualized. Mild to moderate aortic valve stenosis.  Aortic valve area, by VTI measures 1.18 cm. Aortic valve mean gradient measures 12.3 mmHg. Aortic valve Vmax measures 2.32 m/s.  6. The inferior vena cava is normal in size with greater than 50% respiratory variability, suggesting right atrial pressure of 3 mmHg. FINDINGS  Left Ventricle: Left ventricular ejection fraction, by estimation, is 50 to 55%. The left ventricle has low normal function. The left ventricle demonstrates global hypokinesis. The left ventricular internal cavity size was normal in size. There is no left ventricular hypertrophy. Left ventricular diastolic function could not be evaluated due to mitral annular calcification (moderate or greater). Left ventricular diastolic function could not be evaluated. Elevated left atrial pressure. Right Ventricle: The right ventricular size is normal. No increase in right ventricular wall thickness. Right ventricular systolic function is normal.  There is normal pulmonary artery systolic pressure. The tricuspid regurgitant velocity is 2.05 m/s, and  with an assumed right atrial pressure of 8 mmHg, the estimated right ventricular systolic pressure is 24.2 mmHg. Left Atrium: Left atrial size was mildly dilated. Right Atrium: Right atrial size was normal in size. Pericardium: There is no evidence of pericardial effusion. Mitral Valve: The mitral valve is normal in structure. Moderate mitral annular calcification. No evidence of mitral valve regurgitation. No evidence of mitral valve stenosis. Tricuspid Valve: The tricuspid valve is normal in structure. Tricuspid valve regurgitation is not demonstrated. No evidence of tricuspid stenosis. Aortic Valve: The aortic valve is calcified. There is mild thickening of the aortic valve. There is mild aortic valve annular calcification. Aortic valve regurgitation is not visualized. Mild to moderate aortic stenosis is present. Aortic valve mean gradient measures 12.3 mmHg. Aortic valve peak gradient measures 21.5 mmHg. Aortic valve area, by VTI measures 1.18 cm. Pulmonic Valve: The pulmonic valve was normal in structure. Pulmonic valve regurgitation is not visualized. No evidence of pulmonic stenosis. Aorta: The aortic root is normal in size and structure. Venous: The inferior vena cava is normal in size with greater than 50% respiratory variability, suggesting right atrial pressure of 3 mmHg. IAS/Shunts: No atrial level shunt detected by color flow Doppler. Additional Comments: A device lead is visualized.  LEFT VENTRICLE PLAX 2D LVIDd:         4.40 cm     Diastology LVIDs:         2.90 cm     LV e' medial:    3.60 cm/s LV PW:         0.80 cm     LV E/e' medial:  39.2 LV IVS:        0.70 cm     LV e' lateral:   5.96 cm/s LVOT diam:     1.90 cm     LV E/e' lateral: 23.7 LV SV:         66 LV SV Index:   39 LVOT Area:     2.84 cm  LV Volumes (MOD) LV vol d, MOD A2C: 91.6 ml LV vol d, MOD A4C: 73.6 ml LV vol s, MOD A2C: 42.0  ml LV vol s, MOD A4C: 33.7 ml LV SV MOD A2C:     49.6 ml LV SV MOD A4C:     73.6 ml LV SV MOD BP:      44.3 ml RIGHT VENTRICLE RV S prime:     9.81 cm/s TAPSE (M-mode): 2.0 cm LEFT ATRIUM             Index        RIGHT ATRIUM  Index LA diam:        3.60 cm 2.16 cm/m   RA Area:     15.90 cm LA Vol (A2C):   50.4 ml 30.23 ml/m  RA Volume:   31.50 ml  18.89 ml/m LA Vol (A4C):   51.5 ml 30.89 ml/m LA Biplane Vol: 56.0 ml 33.59 ml/m  AORTIC VALVE AV Area (Vmax):    1.32 cm AV Area (Vmean):   1.24 cm AV Area (VTI):     1.18 cm AV Vmax:           231.67 cm/s AV Vmean:          165.667 cm/s AV VTI:            0.560 m AV Peak Grad:      21.5 mmHg AV Mean Grad:      12.3 mmHg LVOT Vmax:         108.00 cm/s LVOT Vmean:        72.200 cm/s LVOT VTI:          0.232 m LVOT/AV VTI ratio: 0.41  AORTA Ao Root diam: 2.90 cm Ao Asc diam:  3.30 cm MITRAL VALVE                TRICUSPID VALVE MV Area (PHT): 2.99 cm     TR Peak grad:   16.8 mmHg MV Decel Time: 254 msec     TR Vmax:        205.00 cm/s MV E velocity: 141.00 cm/s MV A velocity: 153.00 cm/s  SHUNTS MV E/A ratio:  0.92         Systemic VTI:  0.23 m                             Systemic Diam: 1.90 cm Kardie Tobb DO Electronically signed by Berniece Salines DO Signature Date/Time: 04/26/2022/2:24:07 PM    Final    NM Pulmonary Perf and Vent  Result Date: 04/26/2022 CLINICAL DATA:  Pulmonary embolism suspected, high probability EXAM: NUCLEAR MEDICINE PERFUSION LUNG SCAN TECHNIQUE: Perfusion images were obtained in multiple projections after intravenous injection of radiopharmaceutical. Ventilation scans intentionally deferred if perfusion scan and chest x-ray adequate for interpretation during COVID 19 epidemic. RADIOPHARMACEUTICALS:  4.07 mCi Tc-100mMAA IV COMPARISON:  Portable chest radiographs 04/25/2022 and 03/27/2022. Abdominal CT 04/25/2022. FINDINGS: There is patchy decreased perfusion to both lung bases corresponding with basilar airspace opacities on CT. No  upper lobe or segmental perfusion defects are demonstrated to suggest pulmonary embolism. IMPRESSION: No evidence of acute pulmonary embolism on perfusion scintigraphy by PISAPED criteria. Assessment limited by known bibasilar airspace opacities on abdominal CT. If patient unable to receive intravenous contrast for pulmonary arterial CTA, consider further evaluation with lower extremity venous Doppler ultrasound. Electronically Signed   By: WRichardean SaleM.D.   On: 04/26/2022 13:09        Scheduled Meds:  ascorbic acid  500 mg Oral BID   azithromycin  500 mg Oral Daily   docusate sodium  100 mg Oral BID   enoxaparin (LOVENOX) injection  40 mg Subcutaneous Q24H   feeding supplement  237 mL Oral BID BM   feeding supplement (GLUCERNA SHAKE)  237 mL Oral TID BM   insulin aspart  0-15 Units Subcutaneous TID WC   insulin aspart  0-5 Units Subcutaneous QHS   leptospermum manuka honey  1 Application Topical Daily   multivitamin with minerals  1 tablet  Oral Daily   QUEtiapine  25 mg Oral QHS   risperiDONE  1 mg Oral BID   rosuvastatin  40 mg Oral Daily   senna  1 tablet Oral BID   zinc sulfate  220 mg Oral Daily   Continuous Infusions:  cefTRIAXone (ROCEPHIN)  IV 2 g (04/27/22 1800)     LOS: 2 days    Time spent: 45 minutes spent on chart review, discussion with nursing staff, consultants, updating family and interview/physical exam; more than 50% of that time was spent in counseling and/or coordination of care.    Geradine Girt, DO [facility] Available via Epic secure chat 7am-7pm After these hours, please refer to coverage provider listed on [email or web address] 04/28/2022, 11:48 AM

## 2022-04-28 NOTE — Progress Notes (Signed)
                                                     Palliative Care Progress Note   Patient Name: Gail Lynch       Date: 04/28/2022 DOB: 09-01-1940  Age: 81 y.o. MRN#: [id number] Attending Physician: Geradine Girt, DO Primary Care Physician: Pcp, No Admit Date: 04/25/2022  After reviewing the patient's chart and assessing the patient at bedside, I spoke with patient's attending Dr. Eliseo Squires.  Attending in agreement that there are no palliative needs at this time.  Patient is improving with plans to return to SNF.  Outpatient palliative to follow for future goals of care discussions when appropriate.  Please reengage PMT if goals change, at patient/family's request, or if patient's health deteriorates during hospitalization.  Thank you for allowing the Palliative Medicine Team to assist in the care of [name].  Hawkins Ilsa Iha, FNP-BC Palliative Medicine Team Team Phone # [phone]   NO CHARGE

## 2022-04-28 NOTE — Progress Notes (Signed)
Physical Therapy Treatment Patient Details Name: Gail Lynch MRN: [id number] DOB: September 02, 1940 Today's Date: 04/28/2022   History of Present Illness 81 yo female admitted with Pna, COVID, staff at ALF found blood in patient's adult diaper. Hx of hearing loss, DM, pacemaker, dementia, COVID, gout, CKD    PT Comments    Pt just OOB to recliner with nursing staff upon therapist entering room.  RN reports pt requiring a little more assist due to waking up.  Pt performed LE exercises in recliner and then squirming in chair reporting her buttocks hurt.  Pt repositioned in recliner and offset bottom with pillow placement to pt comfort.  Pt may benefit from chair cushion for OOB.  Recommend SNF upon d/c if ALF unable to provide assist.    Recommendations for follow up therapy are one component of a multi-disciplinary discharge planning process, led by the attending physician.  Recommendations may be updated based on patient status, additional functional criteria and insurance authorization.  Follow Up Recommendations  Skilled nursing-short term rehab (<3 hours/day) Can patient physically be transported by private vehicle: Yes   Assistance Recommended at Discharge Frequent or constant Supervision/Assistance  Patient can return home with the following A little help with walking and/or transfers;A little help with bathing/dressing/bathroom;Assistance with cooking/housework;Assist for transportation;Help with stairs or ramp for entrance   Equipment Recommendations  None recommended by PT    Recommendations for Other Services       Precautions / Restrictions Precautions Precautions: Fall     Mobility  Bed Mobility               General bed mobility comments: pt OOB to recliner with nursing just prior to arrival    Transfers                        Ambulation/Gait                   Stairs             Wheelchair Mobility    Modified Rankin (Stroke  Patients Only)       Balance                                            Cognition Arousal/Alertness: Awake/alert Behavior During Therapy: WFL for tasks assessed/performed Overall Cognitive Status: History of cognitive impairments - at baseline                                 General Comments: requires cueing and increased time. she follows 1 step commands.        Exercises General Exercises - Lower Extremity Ankle Circles/Pumps: AAROM, Both, 10 reps Quad Sets: AROM, 5 reps, Both Heel Slides: AAROM, Both, 15 reps Hip ABduction/ADduction: AAROM, Both, 15 reps    General Comments        Pertinent Vitals/Pain Pain Assessment Pain Assessment: Faces Faces Pain Scale: Hurts a little bit Pain Location: buttocks Pain Descriptors / Indicators: Grimacing, Discomfort Pain Intervention(s): Monitored during session, Repositioned    Home Living                          Prior Function            PT Goals (current goals can  now be found in the care plan section) Progress towards PT goals: Progressing toward goals    Frequency    Min 2X/week      PT Plan Current plan remains appropriate    Co-evaluation              AM-PAC PT "6 Clicks" Mobility   Outcome Measure  Help needed turning from your back to your side while in a flat bed without using bedrails?: A Little Help needed moving from lying on your back to sitting on the side of a flat bed without using bedrails?: A Lot Help needed moving to and from a bed to a chair (including a wheelchair)?: A Little Help needed standing up from a chair using your arms (e.g., wheelchair or bedside chair)?: A Little Help needed to walk in hospital room?: A Little Help needed climbing 3-5 steps with a railing? : Total 6 Click Score: 15    End of Session   Activity Tolerance: Patient tolerated treatment well Patient left: in chair;with call bell/phone within reach   PT Visit  Diagnosis: Muscle weakness (generalized) (M62.81)     Time: 4268-3419 PT Time Calculation (min) (ACUTE ONLY): 20 min  Charges:  $Therapeutic Exercise: 8-22 mins                     Jannette Spanner PT, DPT Physical Therapist Acute Rehabilitation Services Preferred contact method: Secure Chat Weekend Pager Only: [phone] Office: [phone]    Myrtis Hopping [place] 04/28/2022, 11:49 AM

## 2022-04-29 DIAGNOSIS — J189 Pneumonia, unspecified organism: Secondary | ICD-10-CM | POA: Diagnosis not present

## 2022-04-29 LAB — D-DIMER, QUANTITATIVE: D-Dimer, Quant: 2.62 ug/mL-FEU — ABNORMAL HIGH (ref 0.00–0.50)

## 2022-04-29 LAB — CBC WITH DIFFERENTIAL/PLATELET
Abs Immature Granulocytes: 0.04 10*3/uL (ref 0.00–0.07)
Basophils Absolute: 0 10*3/uL (ref 0.0–0.1)
Basophils Relative: 1 %
Eosinophils Absolute: 0.1 10*3/uL (ref 0.0–0.5)
Eosinophils Relative: 2 %
HCT: 24.9 % — ABNORMAL LOW (ref 36.0–46.0)
Hemoglobin: 8.6 g/dL — ABNORMAL LOW (ref 12.0–15.0)
Immature Granulocytes: 1 %
Lymphocytes Relative: 31 %
Lymphs Abs: 1.9 10*3/uL (ref 0.7–4.0)
MCH: 32.1 pg (ref 26.0–34.0)
MCHC: 34.5 g/dL (ref 30.0–36.0)
MCV: 92.9 fL (ref 80.0–100.0)
Monocytes Absolute: 0.6 10*3/uL (ref 0.1–1.0)
Monocytes Relative: 10 %
Neutro Abs: 3.5 10*3/uL (ref 1.7–7.7)
Neutrophils Relative %: 55 %
Platelets: 319 10*3/uL (ref 150–400)
RBC: 2.68 MIL/uL — ABNORMAL LOW (ref 3.87–5.11)
RDW: 13.9 % (ref 11.5–15.5)
WBC: 6.3 10*3/uL (ref 4.0–10.5)
nRBC: 0 % (ref 0.0–0.2)

## 2022-04-29 LAB — COMPREHENSIVE METABOLIC PANEL
ALT: 11 U/L (ref 0–44)
AST: 14 U/L — ABNORMAL LOW (ref 15–41)
Albumin: 2 g/dL — ABNORMAL LOW (ref 3.5–5.0)
Alkaline Phosphatase: 51 U/L (ref 38–126)
Anion gap: 6 (ref 5–15)
BUN: 19 mg/dL (ref 8–23)
CO2: 25 mmol/L (ref 22–32)
Calcium: 8.3 mg/dL — ABNORMAL LOW (ref 8.9–10.3)
Chloride: 112 mmol/L — ABNORMAL HIGH (ref 98–111)
Creatinine, Ser: 1.24 mg/dL — ABNORMAL HIGH (ref 0.44–1.00)
GFR, Estimated: 43 mL/min — ABNORMAL LOW (ref >60)
Glucose, Bld: 95 mg/dL (ref 70–99)
Potassium: 3.9 mmol/L (ref 3.5–5.1)
Sodium: 143 mmol/L (ref 135–145)
Total Bilirubin: 0.6 mg/dL (ref 0.3–1.2)
Total Protein: 5.5 g/dL — ABNORMAL LOW (ref 6.5–8.1)

## 2022-04-29 LAB — GLUCOSE, CAPILLARY
Glucose-Capillary: 96 mg/dL (ref 70–99)
Glucose-Capillary: 94 mg/dL (ref 70–99)
Glucose-Capillary: 128 mg/dL — ABNORMAL HIGH (ref 70–99)
Glucose-Capillary: 163 mg/dL — ABNORMAL HIGH (ref 70–99)

## 2022-04-29 LAB — PHOSPHORUS: Phosphorus: 2.7 mg/dL (ref 2.5–4.6)

## 2022-04-29 LAB — C-REACTIVE PROTEIN: CRP: 3.6 mg/dL — ABNORMAL HIGH (ref <1.0)

## 2022-04-29 LAB — FERRITIN: Ferritin: 295 ng/mL (ref 11–307)

## 2022-04-29 LAB — MAGNESIUM: Magnesium: 1.8 mg/dL (ref 1.7–2.4)

## 2022-04-29 NOTE — Plan of Care (Signed)
  Problem: Education: Goal: Knowledge of risk factors and measures for prevention of condition will improve Outcome: Progressing   Problem: Education: Goal: Knowledge of risk factors and measures for prevention of condition will improve Outcome: Progressing

## 2022-04-29 NOTE — Progress Notes (Signed)
PROGRESS NOTE    Gail Lynch  JJK:[id number] DOB: [birth year] DOA: 04/25/2022 PCP: Pcp, No    Brief Narrative:  81 y.o. female with medical history significant of dementia hypertension hyperlipidemia CKD, gout, complete AV block status post pacemaker. She was admitted for post COVID-pneumonia.  Return to ALF with home health. Would recommend outpatient palliative care referral  Assessment and Plan: Hypokalemia -repleted   CAP (community acquired pneumonia)  - -Patient presenting with  productive cough, and infiltrate on chest x-ray -Infiltrate on CXR and 2-3 characteristics (fever, leukocytosis, purulent sputum) are consistent with pneumonia. -This appears to be most likely community-acquired pneumonia. post viral given recent COVId infection   -abx- change to PO when able to d/c -8/27 initial diagnosis: continue isolation for 21 days  Hypomagnesemia -replete    Anemia -Blood in diaper was due to decub ulcer- wound care -heme negative   Diabetes ([place])  -  SSI     AKI on CKD (chronic kidney disease), stage IIIa (HCC)  -chronic  -stable   Elevated D-dimer after 5 Dopplar negative -V/Q negative for PE -echo: Left ventricular ejection fraction, by estimation, is 50 to 55%. The  left ventricle has low normal function. The left ventricle demonstrates  global hypokinesis. Left ventricular diastolic function could not be  evaluated. Elevated left atrial  pressure.    Decubitus pressure ulcer -WOC consult -POA -Sacrum: Unstageable pressure injury: 1cm x 1cm x 0.1cm ; 80% yellow/20% pink Right ischium: Unstageable pressure injury: 2cm x 1cm x 0.1cm; 90% yellow/10% pink Left ischium: 0.4cm x 0.3cm x 0.1cm; 100% pink, moist Right heel: Unstageable Pressure injury: 4cm x 5cm x 0cm; 100% intact black eschar Pressure Injury POA: Yes/No/NA"  Prevalon boots bilaterally to offload heels Low air loss mattress for moisture management and pressure redistribution Medihoney to the  right ischial and the sacral wound daily for debridement Foam to the left ischium for protection, insulation  DVT prophylaxis: enoxaparin (LOVENOX) injection 40 mg Start: 04/27/22 1800 SCDs Start: 04/26/22 0221    Code Status: Full Code Family Communication: daughter (not legal guardian) at bedside again  Disposition Plan:  Level of care: Telemetry Status is: inpt  Consultants:  woc   Subjective: Wants to sleep this AM  Objective: Vitals:   04/28/22 0536 04/28/22 1229 04/28/22 2000 04/29/22 0453  BP: (!) 145/74 138/66  (!) 127/51  Pulse: 91 94  72  Resp: '16 20  16  '$ Temp: 98 F (36.7 C) (!) 97.4 F (36.3 C)  98.2 F (36.8 C)  TempSrc: Oral Oral  Oral  SpO2: 99% 96% 97% 92%  Weight:      Height:       No intake or output data in the 24 hours ending 04/29/22 1035  Filed Weights   04/25/22 2357  Weight: 69.5 kg    Examination:     General: Appearance:     Overweight female in no acute distress     Lungs:     respirations unlabored  Heart:    Normal heart rate. Normal rhythm.    MS:   All extremities are intact.   Neurologic:   sleeping           Data Reviewed: I have personally reviewed following labs and imaging studies  CBC: Recent Labs  Lab 04/25/22 2005 04/26/22 0406 04/27/22 0435 04/28/22 0447 04/29/22 0529  WBC 14.6* 12.1* 8.6 7.1 6.3  NEUTROABS 11.2* 9.7* 6.4 4.2 3.5  HGB 8.7* 9.9* 9.3* 9.4* 8.6*  HCT 26.8* 30.0* 27.6*  28.9* 24.9*  MCV 87.0 90.6 91.4 88.7 92.9  PLT 259 299 314 347 191   Basic Metabolic Panel: Recent Labs  Lab 04/25/22 2005 04/26/22 0406 04/27/22 0435 04/28/22 0447 04/29/22 0529  NA 142 143 143 142 143  K 3.4* 3.5 3.6 3.8 3.9  CL 109 110 110 109 112*  CO2 '25 25 26 25 25  '$ GLUCOSE 66* 149* 105* 99 95  BUN 39* 36* 24* 20 19  CREATININE 1.47* 1.34* 1.03* 1.23* 1.24*  CALCIUM 8.3* 8.4* 8.2* 8.1* 8.3*  MG 1.6* 1.8 1.6* 1.8 1.8  PHOS 2.9 3.1 3.0 2.8 2.7   GFR: Estimated Creatinine Clearance: 29.9 mL/min (A)  (by C-G formula based on SCr of 1.24 mg/dL (H)). Liver Function Tests: Recent Labs  Lab 04/25/22 1145 04/26/22 0406 04/27/22 0435 04/28/22 0447 04/29/22 0529  AST '21 18 16 '$ 14* 14*  ALT '14 11 9 11 11  '$ ALKPHOS 87 71 66 61 51  BILITOT 0.6 0.4 0.4 0.5 0.6  PROT 6.4* 5.5* 5.6* 5.4* 5.5*  ALBUMIN 2.2* 2.0* 1.9* 1.8* 2.0*   No results for input(s): "LIPASE", "AMYLASE" in the last 168 hours. No results for input(s): "AMMONIA" in the last 168 hours. Coagulation Profile: Recent Labs  Lab 04/25/22 1145  INR 1.2   Cardiac Enzymes: Recent Labs  Lab 04/25/22 2005  CKTOTAL 88   BNP (last 3 results) No results for input(s): "PROBNP" in the last 8760 hours. HbA1C: No results for input(s): "HGBA1C" in the last 72 hours.  CBG: Recent Labs  Lab 04/27/22 2108 04/28/22 0815 04/28/22 1226 04/28/22 1715 04/29/22 0721  GLUCAP 109* 94 169* 161* 96   Lipid Profile: No results for input(s): "CHOL", "HDL", "LDLCALC", "TRIG", "CHOLHDL", "LDLDIRECT" in the last 72 hours. Thyroid Function Tests: No results for input(s): "TSH", "T4TOTAL", "FREET4", "T3FREE", "THYROIDAB" in the last 72 hours.  Anemia Panel: Recent Labs    04/28/22 0447 04/29/22 0529  FERRITIN 321* 295   Sepsis Labs: Recent Labs  Lab 04/25/22 1358 04/25/22 2005  PROCALCITON  --  21.84  LATICACIDVEN 1.0  --     Recent Results (from the past 240 hour(s))  Resp Panel by RT-PCR (Flu A&B, Covid) Urine, Catheterized     Status: Abnormal   Collection Time: 04/25/22  1:58 PM   Specimen: Urine, Catheterized; Nasal Swab  Result Value Ref Range Status   SARS Coronavirus 2 by RT PCR POSITIVE (A) NEGATIVE Final    Comment: (NOTE) SARS-CoV-2 target nucleic acids are DETECTED.  The SARS-CoV-2 RNA is generally detectable in upper respiratory specimens during the acute phase of infection. Positive results are indicative of the presence of the identified virus, but do not rule out bacterial infection or co-infection with  other pathogens not detected by the test. Clinical correlation with patient history and other diagnostic information is necessary to determine patient infection status. The expected result is Negative.  Fact Sheet for Patients: EntrepreneurPulse.com.au  Fact Sheet for Healthcare Providers: IncredibleEmployment.be  This test is not yet approved or cleared by the [place] FDA and  has been authorized for detection and/or diagnosis of SARS-CoV-2 by FDA under an Emergency Use Authorization (EUA).  This EUA will remain in effect (meaning this test can be used) for the duration of  the COVID-19 declaration under Section 564(b)(1) of the A ct, 21 U.S.C. section 360bbb-3(b)(1), unless the authorization is terminated or revoked sooner.     Influenza A by PCR NEGATIVE NEGATIVE Final   Influenza B by PCR NEGATIVE NEGATIVE Final  Comment: (NOTE) The Xpert Xpress SARS-CoV-2/FLU/RSV plus assay is intended as an aid in the diagnosis of influenza from Nasopharyngeal swab specimens and should not be used as a sole basis for treatment. Nasal washings and aspirates are unacceptable for Xpert Xpress SARS-CoV-2/FLU/RSV testing.  Fact Sheet for Patients: EntrepreneurPulse.com.au  Fact Sheet for Healthcare Providers: IncredibleEmployment.be  This test is not yet approved or cleared by the [place] FDA and has been authorized for detection and/or diagnosis of SARS-CoV-2 by FDA under an Emergency Use Authorization (EUA). This EUA will remain in effect (meaning this test can be used) for the duration of the COVID-19 declaration under Section 564(b)(1) of the Act, 21 U.S.C. section 360bbb-3(b)(1), unless the authorization is terminated or revoked.  Performed at [facility], [place] [address], [place], [place] [address]   Blood Culture (routine x 2)     Status: None (Preliminary result)   Collection  Time: 04/25/22  1:58 PM   Specimen: BLOOD  Result Value Ref Range Status   Specimen Description   Final    BLOOD BLOOD LEFT FOREARM Performed at [facility] [address], [place], [place] [address]    Special Requests   Final    BOTTLES DRAWN AEROBIC AND ANAEROBIC Blood Culture adequate volume Performed at [place] [address], [place], [place] [address]    Culture   Final    NO GROWTH 4 DAYS Performed at [facility], [place] [address], [place], [place] [address]    Report Status PENDING  Incomplete  Blood Culture (routine x 2)     Status: None (Preliminary result)   Collection Time: 04/25/22  2:03 PM   Specimen: BLOOD  Result Value Ref Range Status   Specimen Description   Final    BLOOD BLOOD RIGHT FOREARM Performed at [facility] [address], [place], [place] [address]    Special Requests   Final    BOTTLES DRAWN AEROBIC AND ANAEROBIC Blood Culture adequate volume Performed at [facility] [address] [place], [place] [address]    Culture   Final    NO GROWTH 4 DAYS Performed at [facility], [place] [address] [place], [place] [address]    Report Status PENDING  Incomplete  Urine Culture     Status: None   Collection Time: 04/25/22  2:39 PM   Specimen: In/Out Cath Urine  Result Value Ref Range Status   Specimen Description   Final    IN/OUT CATH URINE Performed at [facility] [address] [place], [place] [address]    Special Requests   Final    NONE Performed at [facility], [place] [address] [place], [place] [address]    Culture   Final    NO GROWTH Performed at [facility], [place] [address] [place], [place] [address]    Report Status 04/26/2022 FINAL  Final  Expectorated Sputum Assessment w Gram Stain, Rflx to Resp Cult     Status: None   Collection Time: 04/25/22  8:20 PM   Specimen: Sputum  Result Value  Ref Range Status   Specimen Description SPUTUM  Final   Special Requests NONE  Final   Sputum evaluation   Final    THIS SPECIMEN IS ACCEPTABLE FOR SPUTUM CULTURE Performed at [facility], [place] [address] [place], [place] [address]    Report Status 04/25/2022 FINAL  Final  Culture,  Respiratory w Gram Stain     Status: None   Collection Time: 04/25/22  8:20 PM   Specimen: SPU  Result Value Ref Range Status   Specimen Description   Final    SPUTUM Performed at [facility] [address], [place], [place] [address]    Special Requests   Final    NONE Reflexed from [id number] Performed at [facility], [place] [address], [place], [place] [address]    Gram Stain   Final    FEW WBC PRESENT,BOTH PMN AND MONONUCLEAR RARE SQUAMOUS EPITHELIAL CELLS PRESENT FEW GRAM POSITIVE COCCI IN PAIRS FEW GRAM POSITIVE COCCI IN CLUSTERS    Culture   Final    MODERATE Normal respiratory flora-no Staph aureus or Pseudomonas seen Performed at [facility], [place] [address] [place], [place] [address]    Report Status 04/28/2022 FINAL  Final         Radiology Studies: No results found.      Scheduled Meds:  ascorbic acid  500 mg Oral BID   azithromycin  500 mg Oral Daily   docusate sodium  100 mg Oral BID   enoxaparin (LOVENOX) injection  40 mg Subcutaneous Q24H   feeding supplement  237 mL Oral BID BM   feeding supplement (GLUCERNA SHAKE)  237 mL Oral TID BM   insulin aspart  0-15 Units Subcutaneous TID WC   insulin aspart  0-5 Units Subcutaneous QHS   leptospermum manuka honey  1 Application Topical Daily   multivitamin with minerals  1 tablet Oral Daily   QUEtiapine  25 mg Oral QHS   risperiDONE  1 mg Oral BID   rosuvastatin  40 mg Oral Daily   senna  1 tablet Oral BID   zinc sulfate  220 mg Oral Daily   Continuous Infusions:  cefTRIAXone (ROCEPHIN)  IV Stopped (04/28/22 1920)     LOS: 3 days    Time spent: 45  minutes spent on chart review, discussion with nursing staff, consultants, updating family and interview/physical exam; more than 50% of that time was spent in counseling and/or coordination of care.    Geradine Girt, DO [facility] Available via Epic secure chat 7am-7pm After these hours, please refer to coverage provider listed on [email or web address] 04/29/2022, 10:35 AM

## 2022-04-30 DIAGNOSIS — F03C11 Unspecified dementia, severe, with agitation: Secondary | ICD-10-CM | POA: Diagnosis not present

## 2022-04-30 DIAGNOSIS — U071 COVID-19: Secondary | ICD-10-CM | POA: Diagnosis not present

## 2022-04-30 DIAGNOSIS — J189 Pneumonia, unspecified organism: Secondary | ICD-10-CM | POA: Diagnosis not present

## 2022-04-30 DIAGNOSIS — N1831 Chronic kidney disease, stage 3a: Secondary | ICD-10-CM | POA: Diagnosis not present

## 2022-04-30 LAB — CBC WITH DIFFERENTIAL/PLATELET
Abs Immature Granulocytes: 0.03 10*3/uL (ref 0.00–0.07)
Basophils Absolute: 0.1 10*3/uL (ref 0.0–0.1)
Basophils Relative: 1 %
Eosinophils Absolute: 0.1 10*3/uL (ref 0.0–0.5)
Eosinophils Relative: 2 %
HCT: 27.7 % — ABNORMAL LOW (ref 36.0–46.0)
Hemoglobin: 9.1 g/dL — ABNORMAL LOW (ref 12.0–15.0)
Immature Granulocytes: 1 %
Lymphocytes Relative: 28 %
Lymphs Abs: 1.6 10*3/uL (ref 0.7–4.0)
MCH: 30.7 pg (ref 26.0–34.0)
MCHC: 32.9 g/dL (ref 30.0–36.0)
MCV: 93.6 fL (ref 80.0–100.0)
Monocytes Absolute: 0.5 10*3/uL (ref 0.1–1.0)
Monocytes Relative: 9 %
Neutro Abs: 3.4 10*3/uL (ref 1.7–7.7)
Neutrophils Relative %: 59 %
Platelets: 324 10*3/uL (ref 150–400)
RBC: 2.96 MIL/uL — ABNORMAL LOW (ref 3.87–5.11)
RDW: 13.9 % (ref 11.5–15.5)
WBC: 5.7 10*3/uL (ref 4.0–10.5)
nRBC: 0 % (ref 0.0–0.2)

## 2022-04-30 LAB — GLUCOSE, CAPILLARY
Glucose-Capillary: 134 mg/dL — ABNORMAL HIGH (ref 70–99)
Glucose-Capillary: 224 mg/dL — ABNORMAL HIGH (ref 70–99)
Glucose-Capillary: 314 mg/dL — ABNORMAL HIGH (ref 70–99)
Glucose-Capillary: 150 mg/dL — ABNORMAL HIGH (ref 70–99)

## 2022-04-30 LAB — CULTURE, BLOOD (ROUTINE X 2)
Culture: NO GROWTH
Culture: NO GROWTH
Special Requests: ADEQUATE
Special Requests: ADEQUATE

## 2022-04-30 LAB — PHOSPHORUS: Phosphorus: 3.1 mg/dL (ref 2.5–4.6)

## 2022-04-30 LAB — COMPREHENSIVE METABOLIC PANEL
ALT: 11 U/L (ref 0–44)
AST: 17 U/L (ref 15–41)
Albumin: 2.1 g/dL — ABNORMAL LOW (ref 3.5–5.0)
Alkaline Phosphatase: 55 U/L (ref 38–126)
Anion gap: 6 (ref 5–15)
BUN: 19 mg/dL (ref 8–23)
CO2: 26 mmol/L (ref 22–32)
Calcium: 8.5 mg/dL — ABNORMAL LOW (ref 8.9–10.3)
Chloride: 110 mmol/L (ref 98–111)
Creatinine, Ser: 1.08 mg/dL — ABNORMAL HIGH (ref 0.44–1.00)
GFR, Estimated: 51 mL/min — ABNORMAL LOW (ref >60)
Glucose, Bld: 147 mg/dL — ABNORMAL HIGH (ref 70–99)
Potassium: 4.4 mmol/L (ref 3.5–5.1)
Sodium: 142 mmol/L (ref 135–145)
Total Bilirubin: 0.4 mg/dL (ref 0.3–1.2)
Total Protein: 5.7 g/dL — ABNORMAL LOW (ref 6.5–8.1)

## 2022-04-30 LAB — MAGNESIUM: Magnesium: 1.7 mg/dL (ref 1.7–2.4)

## 2022-04-30 MED ORDER — HYDROCORTISONE ACETATE 25 MG RE SUPP
25.0000 mg | Freq: Two times a day (BID) | RECTAL | Status: DC
Start: 2022-04-30 — End: 2022-05-01
  Administered 2022-04-30 – 2022-05-01 (×2): 25 mg via RECTAL
  Filled 2022-04-30 (×2): qty 1

## 2022-04-30 MED ORDER — HYDRALAZINE HCL 25 MG PO TABS
25.0000 mg | ORAL_TABLET | Freq: Three times a day (TID) | ORAL | Status: DC
  Administered 2022-04-30 – 2022-05-01 (×3): 25 mg via ORAL
  Filled 2022-04-30 (×3): qty 1

## 2022-04-30 NOTE — Progress Notes (Signed)
PROGRESS NOTE    Gail Lynch  HWE:[id number] DOB: 1940-10-09 DOA: 04/25/2022 PCP: Pcp, No    Brief Narrative:  81 y.o. female with medical history significant of dementia hypertension hyperlipidemia CKD, gout, complete AV block status post pacemaker. She was admitted for post COVID-pneumonia.  Return to ALF with home health. Would recommend outpatient palliative care referral  Assessment and Plan: Hypokalemia -repleted   CAP (community acquired pneumonia)  - -Patient presenting with  productive cough, and infiltrate on chest x-ray -Infiltrate on CXR and 2-3 characteristics (fever, leukocytosis, purulent sputum) are consistent with pneumonia. -This appears to be most likely community-acquired pneumonia. post viral given recent COVId infection   -abx- change to PO when able to d/c -8/27 initial diagnosis: continue isolation for 21 days  Hypomagnesemia -replete    Anemia -Blood in diaper was due to decub ulcer- wound care -heme negative   Diabetes ([place])  -  SSI     AKI on CKD (chronic kidney disease), stage IIIa (HCC)  -chronic  -stable   Elevated D-dimer after 5 Dopplar negative -V/Q negative for PE -echo: Left ventricular ejection fraction, by estimation, is 50 to 55%. The  left ventricle has low normal function. The left ventricle demonstrates  global hypokinesis. Left ventricular diastolic function could not be  evaluated. Elevated left atrial  pressure.    Decubitus pressure ulcer -WOC consult -POA -Sacrum: Unstageable pressure injury: 1cm x 1cm x 0.1cm ; 80% yellow/20% pink Right ischium: Unstageable pressure injury: 2cm x 1cm x 0.1cm; 90% yellow/10% pink Left ischium: 0.4cm x 0.3cm x 0.1cm; 100% pink, moist Right heel: Unstageable Pressure injury: 4cm x 5cm x 0cm; 100% intact black eschar Pressure Injury POA: Yes/No/NA"  Prevalon boots bilaterally to offload heels Low air loss mattress for moisture management and pressure redistribution Medihoney to the  right ischial and the sacral wound daily for debridement Foam to the left ischium for protection, insulation  DVT prophylaxis: enoxaparin (LOVENOX) injection 40 mg Start: 04/27/22 1800 SCDs Start: 04/26/22 0221    Code Status: Full Code Family Communication: daughter (not legal guardian) at bedside again 9/10  Disposition Plan:  Level of care: Med-Surg Status is: inpt  Consultants:  woc   Subjective: No complaints   Objective: Vitals:   04/29/22 0453 04/29/22 1240 04/29/22 1952 04/30/22 0614  BP: (!) 127/51 (!) 148/57 (!) 161/66 (!) 144/62  Pulse: 72 81 87 64  Resp: '16 16 15 15  '$ Temp: 98.2 F (36.8 C) 98.4 F (36.9 C) 98.6 F (37 C) 98.4 F (36.9 C)  TempSrc: Oral Oral Oral Oral  SpO2: 92% 97% 95% 93%  Weight:      Height:        Intake/Output Summary (Last 24 hours) at 04/30/2022 1123 Last data filed at 04/30/2022 1000 Gross per 24 hour  Intake 1617 ml  Output 0 ml  Net 1617 ml    Filed Weights   04/25/22 2357  Weight: 69.5 kg    Examination:    General: Appearance:     Overweight female in no acute distress     Lungs:     respirations unlabored  Heart:    Normal heart rate. Normal rhythm.    MS:   All extremities are intact.   Neurologic:   sleeping        Data Reviewed: I have personally reviewed following labs and imaging studies  CBC: Recent Labs  Lab 04/26/22 0406 04/27/22 0435 04/28/22 0447 04/29/22 0529 04/30/22 0440  WBC 12.1* 8.6 7.1 6.3  5.7  NEUTROABS 9.7* 6.4 4.2 3.5 3.4  HGB 9.9* 9.3* 9.4* 8.6* 9.1*  HCT 30.0* 27.6* 28.9* 24.9* 27.7*  MCV 90.6 91.4 88.7 92.9 93.6  PLT 299 314 347 319 765   Basic Metabolic Panel: Recent Labs  Lab 04/26/22 0406 04/27/22 0435 04/28/22 0447 04/29/22 0529 04/30/22 0440  NA 143 143 142 143 142  K 3.5 3.6 3.8 3.9 4.4  CL 110 110 109 112* 110  CO2 '25 26 25 25 26  '$ GLUCOSE 149* 105* 99 95 147*  BUN 36* 24* '20 19 19  '$ CREATININE 1.34* 1.03* 1.23* 1.24* 1.08*  CALCIUM 8.4* 8.2* 8.1* 8.3*  8.5*  MG 1.8 1.6* 1.8 1.8 1.7  PHOS 3.1 3.0 2.8 2.7 3.1   GFR: Estimated Creatinine Clearance: 34.3 mL/min (A) (by C-G formula based on SCr of 1.08 mg/dL (H)). Liver Function Tests: Recent Labs  Lab 04/26/22 0406 04/27/22 0435 04/28/22 0447 04/29/22 0529 04/30/22 0440  AST 18 16 14* 14* 17  ALT '11 9 11 11 11  '$ ALKPHOS 71 66 61 51 55  BILITOT 0.4 0.4 0.5 0.6 0.4  PROT 5.5* 5.6* 5.4* 5.5* 5.7*  ALBUMIN 2.0* 1.9* 1.8* 2.0* 2.1*   No results for input(s): "LIPASE", "AMYLASE" in the last 168 hours. No results for input(s): "AMMONIA" in the last 168 hours. Coagulation Profile: Recent Labs  Lab 04/25/22 1145  INR 1.2   Cardiac Enzymes: Recent Labs  Lab 04/25/22 2005  CKTOTAL 88   BNP (last 3 results) No results for input(s): "PROBNP" in the last 8760 hours. HbA1C: No results for input(s): "HGBA1C" in the last 72 hours.  CBG: Recent Labs  Lab 04/29/22 0721 04/29/22 1235 04/29/22 1632 04/29/22 1949 04/30/22 0805  GLUCAP 96 94 128* 163* 134*   Lipid Profile: No results for input(s): "CHOL", "HDL", "LDLCALC", "TRIG", "CHOLHDL", "LDLDIRECT" in the last 72 hours. Thyroid Function Tests: No results for input(s): "TSH", "T4TOTAL", "FREET4", "T3FREE", "THYROIDAB" in the last 72 hours.  Anemia Panel: Recent Labs    04/28/22 0447 04/29/22 0529  FERRITIN 321* 295   Sepsis Labs: Recent Labs  Lab 04/25/22 1358 04/25/22 2005  PROCALCITON  --  21.84  LATICACIDVEN 1.0  --     Recent Results (from the past 240 hour(s))  Resp Panel by RT-PCR (Flu A&B, Covid) Urine, Catheterized     Status: Abnormal   Collection Time: 04/25/22  1:58 PM   Specimen: Urine, Catheterized; Nasal Swab  Result Value Ref Range Status   SARS Coronavirus 2 by RT PCR POSITIVE (A) NEGATIVE Final    Comment: (NOTE) SARS-CoV-2 target nucleic acids are DETECTED.  The SARS-CoV-2 RNA is generally detectable in upper respiratory specimens during the acute phase of infection. Positive results  are indicative of the presence of the identified virus, but do not rule out bacterial infection or co-infection with other pathogens not detected by the test. Clinical correlation with patient history and other diagnostic information is necessary to determine patient infection status. The expected result is Negative.  Fact Sheet for Patients: EntrepreneurPulse.com.au  Fact Sheet for Healthcare Providers: IncredibleEmployment.be  This test is not yet approved or cleared by the [place] FDA and  has been authorized for detection and/or diagnosis of SARS-CoV-2 by FDA under an Emergency Use Authorization (EUA).  This EUA will remain in effect (meaning this test can be used) for the duration of  the COVID-19 declaration under Section 564(b)(1) of the A ct, 21 U.S.C. section 360bbb-3(b)(1), unless the authorization is terminated or revoked sooner.  Influenza A by PCR NEGATIVE NEGATIVE Final   Influenza B by PCR NEGATIVE NEGATIVE Final    Comment: (NOTE) The Xpert Xpress SARS-CoV-2/FLU/RSV plus assay is intended as an aid in the diagnosis of influenza from Nasopharyngeal swab specimens and should not be used as a sole basis for treatment. Nasal washings and aspirates are unacceptable for Xpert Xpress SARS-CoV-2/FLU/RSV testing.  Fact Sheet for Patients: EntrepreneurPulse.com.au  Fact Sheet for Healthcare Providers: IncredibleEmployment.be  This test is not yet approved or cleared by the [place] FDA and has been authorized for detection and/or diagnosis of SARS-CoV-2 by FDA under an Emergency Use Authorization (EUA). This EUA will remain in effect (meaning this test can be used) for the duration of the COVID-19 declaration under Section 564(b)(1) of the Act, 21 U.S.C. section 360bbb-3(b)(1), unless the authorization is terminated or revoked.  Performed at [facility], [place]  [address], [place], [place] [address]   Blood Culture (routine x 2)     Status: None   Collection Time: 04/25/22  1:58 PM   Specimen: BLOOD  Result Value Ref Range Status   Specimen Description   Final    BLOOD BLOOD LEFT FOREARM Performed at [facility] [address], [place], [place] [address]    Special Requests   Final    BOTTLES DRAWN AEROBIC AND ANAEROBIC Blood Culture adequate volume Performed at [facility] [address], [place], [place] [address]    Culture   Final    NO GROWTH 5 DAYS Performed at [facility], [place] [address] [place], [place] [address]    Report Status 04/30/2022 FINAL  Final  Blood Culture (routine x 2)     Status: None   Collection Time: 04/25/22  2:03 PM   Specimen: BLOOD  Result Value Ref Range Status   Specimen Description   Final    BLOOD BLOOD RIGHT FOREARM Performed at [facility] [address], [place], [place] [address]    Special Requests   Final    BOTTLES DRAWN AEROBIC AND ANAEROBIC Blood Culture adequate volume Performed at [facility] [address], [place], [place] [address]    Culture   Final    NO GROWTH 5 DAYS Performed at [facility], [place] [address], [place], [place] [address]    Report Status 04/30/2022 FINAL  Final  Urine Culture     Status: None   Collection Time: 04/25/22  2:39 PM   Specimen: In/Out Cath Urine  Result Value Ref Range Status   Specimen Description   Final    IN/OUT CATH URINE Performed at [facility] [address], [place], [place] [address]    Special Requests   Final    NONE Performed at [facility], [place] [address], [place], [place] [address]    Culture   Final    NO GROWTH Performed at [facility], [place] [address], [place], [place] [address]    Report Status 04/26/2022 FINAL  Final  Expectorated Sputum Assessment w Gram Stain, Rflx to Resp Cult      Status: None   Collection Time: 04/25/22  8:20 PM   Specimen: Sputum  Result Value Ref Range Status   Specimen Description SPUTUM  Final   Special Requests NONE  Final   Sputum evaluation   Final    THIS SPECIMEN IS ACCEPTABLE FOR SPUTUM CULTURE Performed at [facility], [address] [place], [place] [address]    Report Status 04/25/2022 FINAL  Final  Culture, Respiratory w Gram Stain     Status: None   Collection Time: 04/25/22  8:20 PM   Specimen: SPU  Result Value Ref Range Status   Specimen Description   Final    SPUTUM Performed at [facility] [address], [place], [place] [address]    Special Requests   Final    NONE Reflexed from [id number] Performed at [facility], [place] [address] [place], [place] [address]    Gram Stain   Final    FEW WBC PRESENT,BOTH PMN AND MONONUCLEAR RARE SQUAMOUS EPITHELIAL CELLS PRESENT FEW GRAM POSITIVE COCCI IN PAIRS FEW GRAM POSITIVE COCCI IN CLUSTERS    Culture   Final    MODERATE Normal respiratory flora-no Staph aureus or Pseudomonas seen Performed at [facility], [place] [address] [place], [place] [address]    Report Status 04/28/2022 FINAL  Final         Radiology Studies: No results found.      Scheduled Meds:  ascorbic acid  500 mg Oral BID   docusate sodium  100 mg Oral BID   enoxaparin (LOVENOX) injection  40 mg Subcutaneous Q24H   feeding supplement  237 mL Oral BID BM   feeding supplement (GLUCERNA SHAKE)  237 mL Oral TID BM   insulin aspart  0-15 Units Subcutaneous TID WC   insulin aspart  0-5 Units Subcutaneous QHS   leptospermum manuka honey  1 Application Topical Daily   multivitamin with minerals  1 tablet Oral Daily   QUEtiapine  25 mg Oral QHS   risperiDONE  1 mg Oral BID   rosuvastatin  40 mg Oral Daily   senna  1 tablet Oral BID   zinc sulfate  220 mg Oral Daily   Continuous Infusions:  cefTRIAXone (ROCEPHIN)  IV Stopped (04/30/22  0236)     LOS: 4 days    Time spent: 45 minutes spent on chart review, discussion with nursing staff, consultants, updating family and interview/physical exam; more than 50% of that time was spent in counseling and/or coordination of care.    Geradine Girt, DO [facility] Available via Epic secure chat 7am-7pm After these hours, please refer to coverage provider listed on [email or web address] 04/30/2022, 11:23 AM

## 2022-05-01 DIAGNOSIS — J189 Pneumonia, unspecified organism: Secondary | ICD-10-CM | POA: Diagnosis not present

## 2022-05-01 DIAGNOSIS — K649 Unspecified hemorrhoids: Secondary | ICD-10-CM | POA: Insufficient documentation

## 2022-05-01 LAB — GLUCOSE, CAPILLARY
Glucose-Capillary: 162 mg/dL — ABNORMAL HIGH (ref 70–99)
Glucose-Capillary: 214 mg/dL — ABNORMAL HIGH (ref 70–99)

## 2022-05-01 MED ORDER — ALBUTEROL SULFATE HFA 108 (90 BASE) MCG/ACT IN AERS: 2.0000 | INHALATION_SPRAY | RESPIRATORY_TRACT | 0 refills | Status: AC | PRN

## 2022-05-01 MED ORDER — ZINC SULFATE 220 (50 ZN) MG PO CAPS: 220.0000 mg | ORAL_CAPSULE | Freq: Every day | ORAL | 0 refills | Status: DC

## 2022-05-01 MED ORDER — RISPERIDONE 1 MG/ML PO SOLN
1.0000 mg | Freq: Two times a day (BID) | ORAL | 0 refills | Status: AC
Start: 2022-05-01 — End: ?

## 2022-05-01 MED ORDER — HYDROCORTISONE ACETATE 25 MG RE SUPP: 25.0000 mg | Freq: Two times a day (BID) | RECTAL | 0 refills | Status: DC

## 2022-05-01 MED ORDER — GLUCERNA SHAKE PO LIQD: 237.0000 mL | Freq: Three times a day (TID) | ORAL | 0 refills | Status: AC

## 2022-05-01 MED ORDER — ROSUVASTATIN CALCIUM 40 MG PO TABS: 40.0000 mg | ORAL_TABLET | Freq: Every day | ORAL | 0 refills | Status: DC

## 2022-05-01 MED ORDER — HYDRALAZINE HCL 25 MG PO TABS: 25.0000 mg | ORAL_TABLET | Freq: Three times a day (TID) | ORAL | 0 refills | Status: DC

## 2022-05-01 MED ORDER — MEDIHONEY WOUND/BURN DRESSING EX PSTE
1.0000 | PASTE | Freq: Every day | CUTANEOUS | 0 refills | Status: DC
Start: 2022-05-01 — End: 2023-12-11

## 2022-05-01 NOTE — Discharge Summary (Addendum)
Physician Discharge Summary  Brannon Decaire XBM:[id number] DOB: 08-Mar-1941 DOA: 04/25/2022  PCP: Pcp, No  Admit date: 04/25/2022 Discharge date: 05/01/2022  Admitted From: ALF Discharge disposition: alf   Recommendations for Outpatient Follow-Up:   Cleanse sacral and right ischial with saline, pat dry. Apply thin layer of Medihoney to the sacral and right ischial wound. Top with dry dressing. Change daily.  Apply silicone foam to the left ischial wound; change every 3 days and PRN soilage.  Prevalon boots bilaterally to offload heels Low air loss mattress for moisture management and pressure redistribution CBC 1 week-- treat hemorrhoids Bowel regimen for soft formed BMs-- avoid straining  Palliative care referral to follow at [facility] follow up if continues to have bleeding-- appears to be intermittent   Discharge Diagnosis:   Principal Problem:   CAP (community acquired pneumonia) Active Problems:   Uncontrolled type 2 diabetes mellitus with hypoglycemia, with long-term current use of insulin (HCC)   CKD (chronic kidney disease), stage III (HCC)   Complete AV block (HCC)   Dementia (HCC)   Hypokalemia   COVID-19 virus infection   Anemia   AKI (acute kidney injury) ([place])   Sacral wound   Pneumonia   Hemorrhoids    Discharge Condition: Improved.  Diet recommendation: DYS 3  Wound care: Cleanse sacral and right ischial with saline, pat dry. Apply thin layer of Medihoney to the sacral and right ischial wound. Top with dry dressing. Chang daily.   Apply silicone foam to the left ischial wound; change every 3 days and PRN soilage.  Code status: Full.   History of Present Illness:   81 y.o. female with medical history significant of dementia hypertension hyperlipidemia CKD, gout, complete AV block status post pacemaker. She was admitted for post COVID-pneumonia.  Return to ALF with home health. Would recommend outpatient palliative care  referral   Hospital Course by Problem:   Hypokalemia -repleted   CAP (community acquired pneumonia)  - -Patient presenting with  productive cough, and infiltrate on chest x-ray -Infiltrate on CXR and 2-3 characteristics (fever, leukocytosis, purulent sputum) are consistent with pneumonia. -This appears to be most likely community-acquired pneumonia vs post viral given recent COVId infection   -abx finished    Hypomagnesemia -replete    Anemia -Blood in diaper was due to decub ulcer vs hemorrhoids or both-- start suppositories/ wound care/bowel regimen -heme negative initially but then had few drops of blood after BM -outpatient GI follow up if continues   Diabetes ([place])  -  continue insulin -monitor blood sugars   AKI on CKD (chronic kidney disease), stage IIIa (HCC)  -chronic  -stable   Elevated D-dimer after 5 Dopp[place] negative -V/Q negative for PE -echo: Left ventricular ejection fraction, by estimation, is 50 to 55%. The  left ventricle has low normal function. The left ventricle demonstrates  global hypokinesis. Left ventricular diastolic function could not be  evaluated. Elevated left atrial  pressure.     Decubitus pressure ulcer -WOC consult -POA -Sacrum: Unstageable pressure injury: 1cm x 1cm x 0.1cm ; 80% yellow/20% pink Right ischium: Unstageable pressure injury: 2cm x 1cm x 0.1cm; 90% yellow/10% pink Left ischium: 0.4cm x 0.3cm x 0.1cm; 100% pink, moist Right heel: Unstageable Pressure injury: 4cm x 5cm x 0cm; 100% intact black eschar Pressure Injury POA: Yes/No/NA"  Prevalon boots bilaterally to offload heels Low air loss mattress for moisture management and pressure redistribution Medihoney to the right ischial and the sacral wound daily  for debridement Foam to the left ischium for protection, insulation      Medical Consultants:      Discharge Exam:   Vitals:   04/30/22 2130 05/01/22 0536  BP: (!) 151/88 (!) 155/72  Pulse: 76 87  Resp:  20 17  Temp: 98.4 F (36.9 C) 97.9 F (36.6 C)  SpO2: 98% 100%   Vitals:   04/30/22 1241 04/30/22 1243 04/30/22 2130 05/01/22 0536  BP: (!) 153/65 (!) 146/63 (!) 151/88 (!) 155/72  Pulse: 78 80 76 87  Resp: '20  20 17  '$ Temp: 97.8 F (36.6 C)  98.4 F (36.9 C) 97.9 F (36.6 C)  TempSrc: Oral  Oral Oral  SpO2: 97%  98% 100%  Weight:      Height:        General exam: Appears calm and comfortable.    The results of significant diagnostics from this hospitalization (including imaging, microbiology, ancillary and laboratory) are listed below for reference.     Procedures and Diagnostic Studies:   VAS [place] LOWER EXTREMITY VENOUS (DVT)  Result Date: 04/26/2022  Lower Venous DVT Study Patient Name:  NEVADA KIRCHNER  Date of Exam:   04/26/2022 Medical Rec #: [id number]      Accession #:    [id number] Date of Birth: 1940/11/25      Patient Gender: F Patient Age:   46 years Exam Location:  [facility] Procedure:      VAS [place] LOWER EXTREMITY VENOUS (DVT) Referring Phys: Nyoka Lint DOUTOVA --------------------------------------------------------------------------------  Indications: Elevated D-dimer (6.99).  Risk Factors: Immobility COVID+. Limitations: Poor ultrasound/tissue interface and poor patient positioning (lying on side). Comparison Study: No previous exams Performing Technologist: Jody Hill RVT, RDMS  Examination Guidelines: A complete evaluation includes B-mode imaging, spectral Doppler, color Doppler, and power Doppler as needed of all accessible portions of each vessel. Bilateral testing is considered an integral part of a complete examination. Limited examinations for reoccurring indications may be performed as noted. The reflux portion of the exam is performed with the patient in reverse Trendelenburg.  +---------+---------------+---------+-----------+----------+-------------------+ RIGHT    CompressibilityPhasicitySpontaneityPropertiesThrombus Aging       +---------+---------------+---------+-----------+----------+-------------------+ CFV      Full           Yes      Yes                                      +---------+---------------+---------+-----------+----------+-------------------+ SFJ      Full                                                             +---------+---------------+---------+-----------+----------+-------------------+ FV Prox  Full           Yes      Yes                                      +---------+---------------+---------+-----------+----------+-------------------+ FV Mid   Full           Yes      Yes                                      +---------+---------------+---------+-----------+----------+-------------------+  FV DistalFull           Yes      Yes                                      +---------+---------------+---------+-----------+----------+-------------------+ PFV      Full                                                             +---------+---------------+---------+-----------+----------+-------------------+ POP      Full           Yes      Yes                                      +---------+---------------+---------+-----------+----------+-------------------+ PTV                                                   Not well visualized +---------+---------------+---------+-----------+----------+-------------------+ PERO     Full                                                             +---------+---------------+---------+-----------+----------+-------------------+ Difficulty visualizing RLE due to patient lying on left side  +---------+---------------+---------+-----------+----------+--------------+ LEFT     CompressibilityPhasicitySpontaneityPropertiesThrombus Aging +---------+---------------+---------+-----------+----------+--------------+ CFV      Full           Yes      Yes                                  +---------+---------------+---------+-----------+----------+--------------+ SFJ      Full                                                        +---------+---------------+---------+-----------+----------+--------------+ FV Prox  Full           Yes      Yes                                 +---------+---------------+---------+-----------+----------+--------------+ FV Mid   Full           Yes      Yes                                 +---------+---------------+---------+-----------+----------+--------------+ FV DistalFull           Yes      Yes                                 +---------+---------------+---------+-----------+----------+--------------+  PFV      Full                                                        +---------+---------------+---------+-----------+----------+--------------+ POP      Full           Yes      Yes                                 +---------+---------------+---------+-----------+----------+--------------+ PTV                                                   Not visualized +---------+---------------+---------+-----------+----------+--------------+ PERO     Full                                                        +---------+---------------+---------+-----------+----------+--------------+   Left Technical Findings: Not visualized segments include posterior tibial veins.   Summary: BILATERAL: - No evidence of deep vein thrombosis seen in the lower extremities, bilaterally. -No evidence of popliteal cyst, bilaterally.   *See table(s) above for measurements and observations. Electronically signed by Harold Barban MD on 04/26/2022 at 10:32:49 PM.    Final    ECHOCARDIOGRAM COMPLETE  Result Date: 04/26/2022    ECHOCARDIOGRAM REPORT   Patient Name:   MARISA HAGE Date of Exam: 04/26/2022 Medical Rec #:  [id number]     Height:       60.0 in Accession #:    [id number]    Weight:       153.3 lb Date of Birth:  09/23/1940     BSA:          1.667  m Patient Age:    79 years      BP:           138/52 mmHg Patient Gender: F             HR:           79 bpm. Exam Location:  Inpatient Procedure: 2D Echo, Cardiac Doppler and Color Doppler Indications:    Congestive Heart Failure I50.9  History:        Patient has prior history of Echocardiogram examinations, most                 recent 03/09/2020. Risk Factors:Hypertension, Dyslipidemia and                 Diabetes.  Sonographer:    Bernadene Person RDCS Referring Phys: [place]  1. Left ventricular ejection fraction, by estimation, is 50 to 55%. The left ventricle has low normal function. The left ventricle demonstrates global hypokinesis. Left ventricular diastolic function could not be evaluated. Elevated left atrial pressure.  2. Right ventricular systolic function is normal. The right ventricular size is normal. There is normal pulmonary artery systolic pressure.  3. Left atrial size was mildly dilated.  4. The mitral valve is normal in structure. No evidence  of mitral valve regurgitation. No evidence of mitral stenosis. Moderate mitral annular calcification.  5. The aortic valve is calcified. There is mild thickening of the aortic valve. Aortic valve regurgitation is not visualized. Mild to moderate aortic valve stenosis. Aortic valve area, by VTI measures 1.18 cm. Aortic valve mean gradient measures 12.3 mmHg. Aortic valve Vmax measures 2.32 m/s.  6. The inferior vena cava is normal in size with greater than 50% respiratory variability, suggesting right atrial pressure of 3 mmHg. FINDINGS  Left Ventricle: Left ventricular ejection fraction, by estimation, is 50 to 55%. The left ventricle has low normal function. The left ventricle demonstrates global hypokinesis. The left ventricular internal cavity size was normal in size. There is no left ventricular hypertrophy. Left ventricular diastolic function could not be evaluated due to mitral annular calcification (moderate or greater). Left  ventricular diastolic function could not be evaluated. Elevated left atrial pressure. Right Ventricle: The right ventricular size is normal. No increase in right ventricular wall thickness. Right ventricular systolic function is normal. There is normal pulmonary artery systolic pressure. The tricuspid regurgitant velocity is 2.05 m/s, and  with an assumed right atrial pressure of 8 mmHg, the estimated right ventricular systolic pressure is 73.4 mmHg. Left Atrium: Left atrial size was mildly dilated. Right Atrium: Right atrial size was normal in size. Pericardium: There is no evidence of pericardial effusion. Mitral Valve: The mitral valve is normal in structure. Moderate mitral annular calcification. No evidence of mitral valve regurgitation. No evidence of mitral valve stenosis. Tricuspid Valve: The tricuspid valve is normal in structure. Tricuspid valve regurgitation is not demonstrated. No evidence of tricuspid stenosis. Aortic Valve: The aortic valve is calcified. There is mild thickening of the aortic valve. There is mild aortic valve annular calcification. Aortic valve regurgitation is not visualized. Mild to moderate aortic stenosis is present. Aortic valve mean gradient measures 12.3 mmHg. Aortic valve peak gradient measures 21.5 mmHg. Aortic valve area, by VTI measures 1.18 cm. Pulmonic Valve: The pulmonic valve was normal in structure. Pulmonic valve regurgitation is not visualized. No evidence of pulmonic stenosis. Aorta: The aortic root is normal in size and structure. Venous: The inferior vena cava is normal in size with greater than 50% respiratory variability, suggesting right atrial pressure of 3 mmHg. IAS/Shunts: No atrial level shunt detected by color flow Doppler. Additional Comments: A device lead is visualized.  LEFT VENTRICLE PLAX 2D LVIDd:         4.40 cm     Diastology LVIDs:         2.90 cm     LV e' medial:    3.60 cm/s LV PW:         0.80 cm     LV E/e' medial:  39.2 LV IVS:        0.70  cm     LV e' lateral:   5.96 cm/s LVOT diam:     1.90 cm     LV E/e' lateral: 23.7 LV SV:         66 LV SV Index:   39 LVOT Area:     2.84 cm  LV Volumes (MOD) LV vol d, MOD A2C: 91.6 ml LV vol d, MOD A4C: 73.6 ml LV vol s, MOD A2C: 42.0 ml LV vol s, MOD A4C: 33.7 ml LV SV MOD A2C:     49.6 ml LV SV MOD A4C:     73.6 ml LV SV MOD BP:      44.3 ml RIGHT  VENTRICLE RV S prime:     9.81 cm/s TAPSE (M-mode): 2.0 cm LEFT ATRIUM             Index        RIGHT ATRIUM           Index LA diam:        3.60 cm 2.16 cm/m   RA Area:     15.90 cm LA Vol (A2C):   50.4 ml 30.23 ml/m  RA Volume:   31.50 ml  18.89 ml/m LA Vol (A4C):   51.5 ml 30.89 ml/m LA Biplane Vol: 56.0 ml 33.59 ml/m  AORTIC VALVE AV Area (Vmax):    1.32 cm AV Area (Vmean):   1.24 cm AV Area (VTI):     1.18 cm AV Vmax:           231.67 cm/s AV Vmean:          165.667 cm/s AV VTI:            0.560 m AV Peak Grad:      21.5 mmHg AV Mean Grad:      12.3 mmHg LVOT Vmax:         108.00 cm/s LVOT Vmean:        72.200 cm/s LVOT VTI:          0.232 m LVOT/AV VTI ratio: 0.41  AORTA Ao Root diam: 2.90 cm Ao Asc diam:  3.30 cm MITRAL VALVE                TRICUSPID VALVE MV Area (PHT): 2.99 cm     TR Peak grad:   16.8 mmHg MV Decel Time: 254 msec     TR Vmax:        205.00 cm/s MV E velocity: 141.00 cm/s MV A velocity: 153.00 cm/s  SHUNTS MV E/A ratio:  0.92         Systemic VTI:  0.23 m                             Systemic Diam: 1.90 cm Kardie Tobb DO Electronically signed by Berniece Salines DO Signature Date/Time: 04/26/2022/2:24:07 PM    Final    NM Pulmonary Perf and Vent  Result Date: 04/26/2022 CLINICAL DATA:  Pulmonary embolism suspected, high probability EXAM: NUCLEAR MEDICINE PERFUSION LUNG SCAN TECHNIQUE: Perfusion images were obtained in multiple projections after intravenous injection of radiopharmaceutical. Ventilation scans intentionally deferred if perfusion scan and chest x-ray adequate for interpretation during COVID 19 epidemic. RADIOPHARMACEUTICALS:   4.07 mCi Tc-32mMAA IV COMPARISON:  Portable chest radiographs 04/25/2022 and 03/27/2022. Abdominal CT 04/25/2022. FINDINGS: There is patchy decreased perfusion to both lung bases corresponding with basilar airspace opacities on CT. No upper lobe or segmental perfusion defects are demonstrated to suggest pulmonary embolism. IMPRESSION: No evidence of acute pulmonary embolism on perfusion scintigraphy by PISAPED criteria. Assessment limited by known bibasilar airspace opacities on abdominal CT. If patient unable to receive intravenous contrast for pulmonary arterial CTA, consider further evaluation with lower extremity venous Doppler ultrasound. Electronically Signed   By: WRichardean SaleM.D.   On: 04/26/2022 13:09   CT ABDOMEN PELVIS WO CONTRAST  Result Date: 04/25/2022 CLINICAL DATA:  Rectal versus vaginal bleeding, leukocytosis, dementia EXAM: CT ABDOMEN AND PELVIS WITHOUT CONTRAST TECHNIQUE: Multidetector CT imaging of the abdomen and pelvis was performed following the standard protocol without IV contrast. RADIATION DOSE REDUCTION: This exam was performed according to the  departmental dose-optimization program which includes automated exposure control, adjustment of the mA and/or kV according to patient size and/or use of iterative reconstruction technique. COMPARISON:  03/04/2014 FINDINGS: Lower chest: BILATERAL lower lobe consolidation with additional infiltrate in RIGHT middle lobe. Central peribronchial thickening. Pacemaker leads RIGHT atrium and RIGHT ventricle. Mitral annular calcification. Hepatobiliary: Cholelithiasis.  Liver unremarkable. Pancreas: Atrophic pancreas without mass Spleen: Normal appearance Adrenals/Urinary Tract: Adrenal thickening without mass. Kidneys normal appearance though minimal perinephric stranding is present bilaterally. No ureteral calcification or dilatation. Bladder unremarkable. Stomach/Bowel: Normal appendix. Minimal sigmoid diverticulosis. No CT evidence of  diverticulitis. Stomach and remaining bowel loops unremarkable. Vascular/Lymphatic: Atherosclerotic calcifications aorta, iliac arteries, femoral arteries. Aorta normal caliber. No adenopathy. Reproductive: Uterus and ovaries grossly unremarkable. Other: No free air or free fluid.  No hernia. Musculoskeletal: Bones demineralized. Mild degenerative disc and facet disease changes lumbar spine. Old appearing superior endplate height loss of T12 unchanged. IMPRESSION: Minimal sigmoid diverticulosis without evidence of diverticulitis. Cholelithiasis. Mild BILATERAL perinephric stranding, recommend correlation with urinalysis to exclude urinary tract infection. BILATERAL lower lobe and RIGHT middle lobe consolidation consistent with pneumonia. Aortic Atherosclerosis (ICD10-I70.0). Electronically Signed   By: Lavonia Dana M.D.   On: 04/25/2022 17:19   DG Chest Port 1 View  Result Date: 04/25/2022 CLINICAL DATA:  Questionable sepsis, rectal and vaginal bleeding EXAM: PORTABLE CHEST 1 VIEW COMPARISON:  04/16/2022 FINDINGS: Cardiomegaly with left chest multi lead pacer. Mild, diffuse bilateral interstitial pulmonary opacity. The visualized skeletal structures are unremarkable. IMPRESSION: Cardiomegaly with mild, diffuse bilateral interstitial pulmonary opacity, likely edema. No focal airspace opacity. Electronically Signed   By: Delanna Ahmadi M.D.   On: 04/25/2022 15:28     Labs:   Basic Metabolic Panel: Recent Labs  Lab 04/26/22 0406 04/27/22 0435 04/28/22 0447 04/29/22 0529 04/30/22 0440  NA 143 143 142 143 142  K 3.5 3.6 3.8 3.9 4.4  CL 110 110 109 112* 110  CO2 '25 26 25 25 26  '$ GLUCOSE 149* 105* 99 95 147*  BUN 36* 24* '20 19 19  '$ CREATININE 1.34* 1.03* 1.23* 1.24* 1.08*  CALCIUM 8.4* 8.2* 8.1* 8.3* 8.5*  MG 1.8 1.6* 1.8 1.8 1.7  PHOS 3.1 3.0 2.8 2.7 3.1   GFR Estimated Creatinine Clearance: 34.3 mL/min (A) (by C-G formula based on SCr of 1.08 mg/dL (H)). Liver Function Tests: Recent Labs  Lab  04/26/22 0406 04/27/22 0435 04/28/22 0447 04/29/22 0529 04/30/22 0440  AST 18 16 14* 14* 17  ALT '11 9 11 11 11  '$ ALKPHOS 71 66 61 51 55  BILITOT 0.4 0.4 0.5 0.6 0.4  PROT 5.5* 5.6* 5.4* 5.5* 5.7*  ALBUMIN 2.0* 1.9* 1.8* 2.0* 2.1*   No results for input(s): "LIPASE", "AMYLASE" in the last 168 hours. No results for input(s): "AMMONIA" in the last 168 hours. Coagulation profile Recent Labs  Lab 04/25/22 1145  INR 1.2    CBC: Recent Labs  Lab 04/26/22 0406 04/27/22 0435 04/28/22 0447 04/29/22 0529 04/30/22 0440  WBC 12.1* 8.6 7.1 6.3 5.7  NEUTROABS 9.7* 6.4 4.2 3.5 3.4  HGB 9.9* 9.3* 9.4* 8.6* 9.1*  HCT 30.0* 27.6* 28.9* 24.9* 27.7*  MCV 90.6 91.4 88.7 92.9 93.6  PLT 299 314 347 319 324   Cardiac Enzymes: Recent Labs  Lab 04/25/22 2005  CKTOTAL 88   BNP: Invalid input(s): "POCBNP" CBG: Recent Labs  Lab 04/30/22 1237 04/30/22 1646 04/30/22 2133 05/01/22 0735 05/01/22 1110  GLUCAP 224* 314* 150* 162* 214*   D-Dimer Recent Labs  04/29/22 0529  DDIMER 2.62*   Hgb A1c No results for input(s): "HGBA1C" in the last 72 hours. Lipid Profile No results for input(s): "CHOL", "HDL", "LDLCALC", "TRIG", "CHOLHDL", "LDLDIRECT" in the last 72 hours. Thyroid function studies No results for input(s): "TSH", "T4TOTAL", "T3FREE", "THYROIDAB" in the last 72 hours.  Invalid input(s): "FREET3" Anemia work up Recent Labs    04/29/22 0529  FERRITIN 295   Microbiology Recent Results (from the past 240 hour(s))  Resp Panel by RT-PCR (Flu A&B, Covid) Urine, Catheterized     Status: Abnormal   Collection Time: 04/25/22  1:58 PM   Specimen: Urine, Catheterized; Nasal Swab  Result Value Ref Range Status   SARS Coronavirus 2 by RT PCR POSITIVE (A) NEGATIVE Final    Comment: (NOTE) SARS-CoV-2 target nucleic acids are DETECTED.  The SARS-CoV-2 RNA is generally detectable in upper respiratory specimens during the acute phase of infection. Positive results  are indicative of the presence of the identified virus, but do not rule out bacterial infection or co-infection with other pathogens not detected by the test. Clinical correlation with patient history and other diagnostic information is necessary to determine patient infection status. The expected result is Negative.  Fact Sheet for Patients: EntrepreneurPulse.com.au  Fact Sheet for Healthcare Providers: IncredibleEmployment.be  This test is not yet approved or cleared by the [place] FDA and  has been authorized for detection and/or diagnosis of SARS-CoV-2 by FDA under an Emergency Use Authorization (EUA).  This EUA will remain in effect (meaning this test can be used) for the duration of  the COVID-19 declaration under Section 564(b)(1) of the A ct, 21 U.S.C. section 360bbb-3(b)(1), unless the authorization is terminated or revoked sooner.     Influenza A by PCR NEGATIVE NEGATIVE Final   Influenza B by PCR NEGATIVE NEGATIVE Final    Comment: (NOTE) The Xpert Xpress SARS-CoV-2/FLU/RSV plus assay is intended as an aid in the diagnosis of influenza from Nasopharyngeal swab specimens and should not be used as a sole basis for treatment. Nasal washings and aspirates are unacceptable for Xpert Xpress SARS-CoV-2/FLU/RSV testing.  Fact Sheet for Patients: EntrepreneurPulse.com.au  Fact Sheet for Healthcare Providers: IncredibleEmployment.be  This test is not yet approved or cleared by the [place] FDA and has been authorized for detection and/or diagnosis of SARS-CoV-2 by FDA under an Emergency Use Authorization (EUA). This EUA will remain in effect (meaning this test can be used) for the duration of the COVID-19 declaration under Section 564(b)(1) of the Act, 21 U.S.C. section 360bbb-3(b)(1), unless the authorization is terminated or revoked.  Performed at [facility], [place]  [address], [place], [place] [address]   Blood Culture (routine x 2)     Status: None   Collection Time: 04/25/22  1:58 PM   Specimen: BLOOD  Result Value Ref Range Status   Specimen Description   Final    BLOOD BLOOD LEFT FOREARM Performed at [facility] [address], [place], [place] [address]    Special Requests   Final    BOTTLES DRAWN AEROBIC AND ANAEROBIC Blood Culture adequate volume Performed at [facility] [address] [place], [place] [address]    Culture   Final    NO GROWTH 5 DAYS Performed at [facility], [place] [address] [place], [place] [address]    Report Status 04/30/2022 FINAL  Final  Blood Culture (routine x 2)     Status: None   Collection Time: 04/25/22  2:03 PM   Specimen: BLOOD  Result Value Ref Range Status   Specimen Description   Final    BLOOD BLOOD RIGHT FOREARM Performed at [place] [address], [place], [place] [address]    Special Requests   Final    BOTTLES DRAWN AEROBIC AND ANAEROBIC Blood Culture adequate volume Performed at [place] [address], [place], [place] [address]    Culture   Final    NO GROWTH 5 DAYS Performed at [facility], [place] [address] [place], [place] [address]    Report Status 04/30/2022 FINAL  Final  Urine Culture     Status: None   Collection Time: 04/25/22  2:39 PM   Specimen: In/Out Cath Urine  Result Value Ref Range Status   Specimen Description   Final    IN/OUT CATH URINE Performed at [facility] [address] [place], [place] [address]    Special Requests   Final    NONE Performed at [facility], [place] [address], [place], [place] [address]    Culture   Final    NO GROWTH Performed at [facility], [place] [address] [place], [place] [address]    Report Status 04/26/2022 FINAL  Final  Expectorated Sputum Assessment w Gram Stain, Rflx to Resp Cult      Status: None   Collection Time: 04/25/22  8:20 PM   Specimen: Sputum  Result Value Ref Range Status   Specimen Description SPUTUM  Final   Special Requests NONE  Final   Sputum evaluation   Final    THIS SPECIMEN IS ACCEPTABLE FOR SPUTUM CULTURE Performed at [facility], [place] [address], [place], [place] [address]    Report Status 04/25/2022 FINAL  Final  Culture, Respiratory w Gram Stain     Status: None   Collection Time: 04/25/22  8:20 PM   Specimen: SPU  Result Value Ref Range Status   Specimen Description   Final    SPUTUM Performed at [facility] [address] [place], [place] [address]    Special Requests   Final    NONE Reflexed from [id number] Performed at [facility], [place] [address], [place], [place] [address]    Gram Stain   Final    FEW WBC PRESENT,BOTH PMN AND MONONUCLEAR RARE SQUAMOUS EPITHELIAL CELLS PRESENT FEW GRAM POSITIVE COCCI IN PAIRS FEW GRAM POSITIVE COCCI IN CLUSTERS    Culture   Final    MODERATE Normal respiratory flora-no Staph aureus or Pseudomonas seen Performed at [facility], [place] [address], [place], [place] [address]    Report Status 04/28/2022 FINAL  Final     Discharge Instructions:   Discharge Instructions     Discharge instructions   Complete by: As directed    Dys 3 diet   Discharge wound care:   Complete by: As directed    Cleanse sacral and right ischial with saline, pat dry. Apply thin layer of Medihoney to the sacral and right ischial wound. Top with dry dressing. Chang daily.   Apply silicone foam to the left ischial wound; change every 3 days and PRN soilage.   Increase activity slowly   Complete by: As directed       Allergies as of 05/01/2022       Reactions   Aspirin Other (See Comments)   BLEEDING BEHIND EYES   Penicillins Other (See Comments)  Unknown reaction DID THE REACTION INVOLVE: Swelling of the face/tongue/throat, SOB, or low BP?  Unknown Sudden or severe rash/hives, skin peeling, or the inside of the mouth or nose? Unknown Did it require medical treatment? Unknown When did it last happen? Unknown If all above answers are "NO", may proceed with cephalosporin use.        Medication List     STOP taking these medications    allopurinol 300 MG tablet Commonly known as: ZYLOPRIM   diazepam 5 MG tablet Commonly known as: VALIUM   HumuLIN N KwikPen 100 UNIT/ML Kiwkpen Generic drug: Insulin NPH (Human) (Isophane)   metFORMIN 500 MG tablet Commonly known as: GLUCOPHAGE   neomycin-bacitracin-polymyxin [phone] Oint   QUEtiapine 25 MG tablet Commonly known as: SEROquel       TAKE these medications    acetaminophen 500 MG tablet Commonly known as: TYLENOL Take 500 mg by mouth every 6 (six) hours as needed for mild pain, headache or fever.   albuterol 108 (90 Base) MCG/ACT inhaler Commonly known as: VENTOLIN HFA Inhale 2 puffs into the lungs every 4 (four) hours as needed for wheezing or shortness of breath.   alum & mag hydroxide-simeth 200-200-20 MG/5ML suspension Commonly known as: MAALOX/MYLANTA Take 30 mLs by mouth every 6 (six) hours as needed for indigestion or heartburn.   feeding supplement (GLUCERNA SHAKE) Liqd Take 237 mLs by mouth 3 (three) times daily between meals.   guaiFENesin 100 MG/5ML liquid Commonly known as: ROBITUSSIN Take 5 mLs by mouth every 6 (six) hours as needed for cough or to loosen phlegm.   hydrALAZINE 25 MG tablet Commonly known as: APRESOLINE Take 1 tablet (25 mg total) by mouth 3 (three) times daily. What changed: Another medication with the same name was removed. Continue taking this medication, and follow the directions you see here.   hydrochlorothiazide 12.5 MG capsule Commonly known as: Microzide Take 1 capsule (12.5 mg total) by mouth daily. What changed: Another medication with the same name was removed. Continue taking this medication, and follow  the directions you see here.   hydrocortisone 25 MG suppository Commonly known as: ANUSOL-HC Place 1 suppository (25 mg total) rectally 2 (two) times daily.   Lantus SoloStar 100 UNIT/ML Solostar Pen Generic drug: insulin glargine Inject 15 Units into the skin daily.   leptospermum manuka honey Pste paste Apply 1 Application topically daily.   loperamide 2 MG capsule Commonly known as: IMODIUM Take 2 mg by mouth as needed for diarrhea or loose stools.   magnesium hydroxide 400 MG/5ML suspension Commonly known as: MILK OF MAGNESIA Take 30 mLs by mouth at bedtime as needed for mild constipation.   OneTouch Delica Plus [id number] Misc Test bid   OneTouch Verio test strip Generic drug: glucose blood 1 each by Other route 2 (two) times daily. And lancets 2/day   risperiDONE 1 MG/ML oral solution Commonly known as: RISPERDAL Take 1 mL (1 mg total) by mouth 2 (two) times daily. What changed: how much to take   rosuvastatin 40 MG tablet Commonly known as: Crestor Take 1 tablet (40 mg total) by mouth daily. What changed: Another medication with the same name was removed. Continue taking this medication, and follow the directions you see here.   zinc sulfate 220 (50 Zn) MG capsule Take 1 capsule (220 mg total) by mouth daily.               Durable Medical Equipment  (From admission, onward)  Start     Ordered   04/29/22 1028  For home use only DME Air overlay mattress  Once        04/29/22 1027              Discharge Care Instructions  (From admission, onward)           Start     Ordered   05/01/22 0000  Discharge wound care:       Comments: Cleanse sacral and right ischial with saline, pat dry. Apply thin layer of Medihoney to the sacral and right ischial wound. Top with dry dressing. Chang daily.   Apply silicone foam to the left ischial wound; change every 3 days and PRN soilage.   05/01/22 0825            Follow-up Information      [facility], [place] Follow up.   Specialty: [place] Why: A representative from [facility] will contact you within 24-48 hours of discharge. Contact information: [address] [place] [address] [phone]         [facility] [place] [facility] Follow up.   Why: Air mattress Contact information: [address] [place] [address] [phone]                  Time coordinating discharge: 45 min  Signed:  Geradine Girt DO  [facility] 05/01/2022, 11:24 AM

## 2022-05-01 NOTE — Progress Notes (Signed)
Report given to Abran Duke at [facility].  Patient's legal guardian, [name], notified of patient's imminent discharge.  PIV removed.  Discharge instructions left for PTAR at nurses' station.  [facility] has been called.  Angie Fava, RN

## 2022-05-01 NOTE — TOC Transition Note (Addendum)
Transition of Care [facility]) - CM/SW Discharge Note   Patient Details  Name: Gail Lynch MRN: [id number] Date of Birth: Nov 06, 1940  Transition of Care [facility]) CM/SW Contact:  Roseanne Kaufman, RN Phone Number: 05/01/2022, 10:22 AM   Clinical Narrative:   HHPT,HHRN set up with [place], DME: Air mattress with Andee Poles at [employer] [employer]. Transportation with [facility]. RN, MD notified   TOC will continue to follow.  - 11:22 am Report can be called to [phone] [facility] at [facility], MD, RN notified  - 12p: Corey Harold has been called, packet at nursing sec desk, MD, RN notified.  Final next level of care: Assisted Living Barriers to Discharge: No Barriers Identified   Patient Goals and CMS Choice Patient states their goals for this hospitalization and ongoing recovery are:: unable to assess   Choice offered to / list presented to : Patient  Discharge Placement  ALF: [facility]                      Discharge Plan and Services In-house Referral: NA Discharge Planning Services: CM Consult            DME Arranged: Air overlay mattress DME Agency: [employer] Date DME Agency Contacted: 05/01/22 Time DME Agency Contacted: [phone] Representative spoke with at DME Agency: Andee Poles HH Arranged: RN, PT [place] Agency: [place] Date [place]: 04/28/22 Time [place]: 1044 Representative spoke with at [place]: [place] Determinants of Health ([place]) Interventions     Readmission Risk Interventions     No data to display

## 2022-05-01 NOTE — Progress Notes (Signed)
Patient picked up by [facility] for transport to [facility].  Patient's daughter notified of her departure.  Angie Fava, RN

## 2022-05-01 NOTE — Plan of Care (Signed)
  Problem: Education: Goal: Knowledge of risk factors and measures for prevention of condition will improve Outcome: Adequate for Discharge   Problem: Coping: Goal: Psychosocial and spiritual needs will be supported Outcome: Adequate for Discharge   Problem: Respiratory: Goal: Will maintain a patent airway Outcome: Adequate for Discharge Goal: Complications related to the disease process, condition or treatment will be avoided or minimized Outcome: Adequate for Discharge   Problem: Education: Goal: Ability to describe self-care measures that may prevent or decrease complications (Diabetes Survival Skills Education) will improve Outcome: Adequate for Discharge Goal: Individualized Educational Video(s) Outcome: Adequate for Discharge   Problem: Coping: Goal: Ability to adjust to condition or change in health will improve Outcome: Adequate for Discharge   Problem: Fluid Volume: Goal: Ability to maintain a balanced intake and output will improve Outcome: Adequate for Discharge   Problem: Health Behavior/Discharge Planning: Goal: Ability to identify and utilize available resources and services will improve Outcome: Adequate for Discharge Goal: Ability to manage health-related needs will improve Outcome: Adequate for Discharge   Problem: Metabolic: Goal: Ability to maintain appropriate glucose levels will improve Outcome: Adequate for Discharge   Problem: Nutritional: Goal: Maintenance of adequate nutrition will improve Outcome: Adequate for Discharge Goal: Progress toward achieving an optimal weight will improve Outcome: Adequate for Discharge   Problem: Skin Integrity: Goal: Risk for impaired skin integrity will decrease Outcome: Adequate for Discharge   Problem: Tissue Perfusion: Goal: Adequacy of tissue perfusion will improve Outcome: Adequate for Discharge   Problem: Education: Goal: Knowledge of General Education information will improve Description: Including  pain rating scale, medication(s)/side effects and non-pharmacologic comfort measures Outcome: Adequate for Discharge   Problem: Health Behavior/Discharge Planning: Goal: Ability to manage health-related needs will improve Outcome: Adequate for Discharge   Problem: Clinical Measurements: Goal: Ability to maintain clinical measurements within normal limits will improve Outcome: Adequate for Discharge Goal: Will remain free from infection Outcome: Adequate for Discharge Goal: Diagnostic test results will improve Outcome: Adequate for Discharge Goal: Respiratory complications will improve Outcome: Adequate for Discharge Goal: Cardiovascular complication will be avoided Outcome: Adequate for Discharge   Problem: Activity: Goal: Risk for activity intolerance will decrease Outcome: Adequate for Discharge   Problem: Nutrition: Goal: Adequate nutrition will be maintained Outcome: Adequate for Discharge   Problem: Coping: Goal: Level of anxiety will decrease Outcome: Adequate for Discharge   Problem: Elimination: Goal: Will not experience complications related to bowel motility Outcome: Adequate for Discharge Goal: Will not experience complications related to urinary retention Outcome: Adequate for Discharge   Problem: Pain Managment: Goal: General experience of comfort will improve Outcome: Adequate for Discharge   Problem: Safety: Goal: Ability to remain free from injury will improve Outcome: Adequate for Discharge   Problem: Skin Integrity: Goal: Risk for impaired skin integrity will decrease Outcome: Adequate for Discharge   

## 2022-07-12 ENCOUNTER — Ambulatory Visit (INDEPENDENT_AMBULATORY_CARE_PROVIDER_SITE_OTHER): Payer: Medicare Other

## 2022-07-12 DIAGNOSIS — I442 Atrioventricular block, complete: Secondary | ICD-10-CM | POA: Diagnosis not present

## 2022-07-12 LAB — CUP PACEART REMOTE DEVICE CHECK
Battery Remaining Longevity: 94 mo
Battery Voltage: 3 V
Brady Statistic AP VP Percent: 22.63 %
Brady Statistic AP VS Percent: 0 %
Brady Statistic AS VP Percent: 77.13 %
Brady Statistic AS VS Percent: 0.24 %
Brady Statistic RA Percent Paced: 22.39 %
Brady Statistic RV Percent Paced: 99.76 %
Date Time Interrogation Session: 20231121181015
Implantable Lead Connection Status: 753985
Implantable Lead Connection Status: 753985
Implantable Lead Implant Date: 20210720
Implantable Lead Implant Date: 20210720
Implantable Lead Location: 753859
Implantable Lead Location: 753860
Implantable Lead Model: 5076
Implantable Lead Model: 5076
Implantable Pulse Generator Implant Date: 20210720
Lead Channel Impedance Value: 247 Ohm
Lead Channel Impedance Value: 266 Ohm
Lead Channel Impedance Value: 304 Ohm
Lead Channel Impedance Value: 342 Ohm
Lead Channel Pacing Threshold Amplitude: 0.5 V
Lead Channel Pacing Threshold Amplitude: 0.625 V
Lead Channel Pacing Threshold Pulse Width: 0.4 ms
Lead Channel Pacing Threshold Pulse Width: 0.4 ms
Lead Channel Sensing Intrinsic Amplitude: 1.25 mV
Lead Channel Sensing Intrinsic Amplitude: 1.25 mV
Lead Channel Sensing Intrinsic Amplitude: 6 mV
Lead Channel Sensing Intrinsic Amplitude: 6 mV
Lead Channel Setting Pacing Amplitude: 1.5 V
Lead Channel Setting Pacing Amplitude: 2.5 V
Lead Channel Setting Pacing Pulse Width: 0.4 ms
Lead Channel Setting Sensing Sensitivity: 1.2 mV
Zone Setting Status: 755011

## 2022-08-02 NOTE — Progress Notes (Signed)
Remote pacemaker transmission.   

## 2022-10-11 ENCOUNTER — Ambulatory Visit (INDEPENDENT_AMBULATORY_CARE_PROVIDER_SITE_OTHER): Payer: Medicare Other

## 2022-10-11 DIAGNOSIS — I442 Atrioventricular block, complete: Secondary | ICD-10-CM

## 2022-10-11 LAB — CUP PACEART REMOTE DEVICE CHECK
Battery Remaining Longevity: 91 mo
Battery Voltage: 3 V
Brady Statistic AP VP Percent: 18.39 %
Brady Statistic AP VS Percent: 0.04 %
Brady Statistic AS VP Percent: 79.93 %
Brady Statistic AS VS Percent: 1.65 %
Brady Statistic RA Percent Paced: 18.25 %
Brady Statistic RV Percent Paced: 98.31 %
Date Time Interrogation Session: 20240220184319
Implantable Lead Connection Status: 753985
Implantable Lead Connection Status: 753985
Implantable Lead Implant Date: 20210720
Implantable Lead Implant Date: 20210720
Implantable Lead Location: 753859
Implantable Lead Location: 753860
Implantable Lead Model: 5076
Implantable Lead Model: 5076
Implantable Pulse Generator Implant Date: 20210720
Lead Channel Impedance Value: 266 Ohm
Lead Channel Impedance Value: 285 Ohm
Lead Channel Impedance Value: 304 Ohm
Lead Channel Impedance Value: 342 Ohm
Lead Channel Pacing Threshold Amplitude: 0.5 V
Lead Channel Pacing Threshold Amplitude: 0.625 V
Lead Channel Pacing Threshold Pulse Width: 0.4 ms
Lead Channel Pacing Threshold Pulse Width: 0.4 ms
Lead Channel Sensing Intrinsic Amplitude: 1 mV
Lead Channel Sensing Intrinsic Amplitude: 1 mV
Lead Channel Sensing Intrinsic Amplitude: 2.875 mV
Lead Channel Sensing Intrinsic Amplitude: 2.875 mV
Lead Channel Setting Pacing Amplitude: 1.5 V
Lead Channel Setting Pacing Amplitude: 2.5 V
Lead Channel Setting Pacing Pulse Width: 0.4 ms
Lead Channel Setting Sensing Sensitivity: 1.2 mV
Zone Setting Status: 755011

## 2022-11-07 NOTE — Progress Notes (Signed)
Remote pacemaker transmission.   

## 2023-01-10 ENCOUNTER — Ambulatory Visit (INDEPENDENT_AMBULATORY_CARE_PROVIDER_SITE_OTHER): Payer: Medicare Other

## 2023-01-10 DIAGNOSIS — I442 Atrioventricular block, complete: Secondary | ICD-10-CM | POA: Diagnosis not present

## 2023-01-10 LAB — CUP PACEART REMOTE DEVICE CHECK
Battery Remaining Longevity: 89 mo
Battery Voltage: 2.99 V
Brady Statistic AP VP Percent: 22.03 %
Brady Statistic AP VS Percent: 0 %
Brady Statistic AS VP Percent: 77.77 %
Brady Statistic AS VS Percent: 0.21 %
Brady Statistic RA Percent Paced: 21.76 %
Brady Statistic RV Percent Paced: 99.79 %
Date Time Interrogation Session: 20240521202321
Implantable Lead Connection Status: 753985
Implantable Lead Connection Status: 753985
Implantable Lead Implant Date: 20210720
Implantable Lead Implant Date: 20210720
Implantable Lead Location: 753859
Implantable Lead Location: 753860
Implantable Lead Model: 5076
Implantable Lead Model: 5076
Implantable Pulse Generator Implant Date: 20210720
Lead Channel Impedance Value: 266 Ohm
Lead Channel Impedance Value: 285 Ohm
Lead Channel Impedance Value: 304 Ohm
Lead Channel Impedance Value: 342 Ohm
Lead Channel Pacing Threshold Amplitude: 0.5 V
Lead Channel Pacing Threshold Amplitude: 0.75 V
Lead Channel Pacing Threshold Pulse Width: 0.4 ms
Lead Channel Pacing Threshold Pulse Width: 0.4 ms
Lead Channel Sensing Intrinsic Amplitude: 1.125 mV
Lead Channel Sensing Intrinsic Amplitude: 1.125 mV
Lead Channel Sensing Intrinsic Amplitude: 2.875 mV
Lead Channel Sensing Intrinsic Amplitude: 2.875 mV
Lead Channel Setting Pacing Amplitude: 1.5 V
Lead Channel Setting Pacing Amplitude: 2.5 V
Lead Channel Setting Pacing Pulse Width: 0.4 ms
Lead Channel Setting Sensing Sensitivity: 1.2 mV
Zone Setting Status: 755011

## 2023-01-31 NOTE — Progress Notes (Signed)
Remote pacemaker transmission.   

## 2023-02-12 ENCOUNTER — Encounter: Payer: Self-pay | Admitting: Cardiology

## 2023-02-12 ENCOUNTER — Ambulatory Visit: Payer: Medicare Other | Attending: Cardiology | Admitting: Cardiology

## 2023-02-12 VITALS — BP 150/86 | HR 74 | Ht 60.0 in | Wt 173.4 lb

## 2023-02-12 DIAGNOSIS — I442 Atrioventricular block, complete: Secondary | ICD-10-CM

## 2023-02-12 DIAGNOSIS — Z950 Presence of cardiac pacemaker: Secondary | ICD-10-CM | POA: Diagnosis not present

## 2023-02-12 DIAGNOSIS — I10 Essential (primary) hypertension: Secondary | ICD-10-CM

## 2023-02-12 NOTE — Patient Instructions (Signed)
Medication Instructions:  Your physician recommends that you continue on your current medications as directed. Please refer to the Current Medication list given to you today.  *If you need a refill on your cardiac medications before your next appointment, please call your pharmacy*   Lab Work: None ordered   Testing/Procedures: None ordered   Follow-Up: At [facility], you and your health needs are our priority.  As part of our continuing mission to provide you with exceptional heart care, we have created designated Provider Care Teams.  These Care Teams include your primary Cardiologist (physician) and Advanced Practice Providers (APPs -  Physician Assistants and Nurse Practitioners) who all work together to provide you with the care you need, when you need it.  We recommend signing up for the patient portal called "MyChart".  Sign up information is provided on this After Visit Summary.  MyChart is used to connect with patients for Virtual Visits (Telemedicine).  Patients are able to view lab/test results, encounter notes, upcoming appointments, etc.  Non-urgent messages can be sent to your provider as well.   To learn more about what you can do with MyChart, go to [email or web address].    Remote monitoring is used to monitor your Pacemaker or ICD from home. This monitoring reduces the number of office visits required to check your device to one time per year. It allows [place] to keep an eye on the functioning of your device to ensure it is working properly. You are scheduled for a device check from home on 04/11/2023. You may send your transmission at any time that day. If you have a wireless device, the transmission will be sent automatically. After your physician reviews your transmission, you will receive a postcard with your next transmission date.  Your next appointment:   1 year(s)  The format for your next appointment:   In Person  Provider:   Francis Dowse, PA-C{   Thank you  for choosing [facility]!!   Dory Horn, RN [phone]

## 2023-02-12 NOTE — Progress Notes (Signed)
  Electrophysiology Office Note:   Date:  02/12/2023  ID:  Gail Lynch, DOB [birth year], MRN [id number]  Primary Cardiologist: None Electrophysiologist: Jax Abdelrahman Jorja Loa, MD      History of Present Illness:   Gail Lynch is a 82 y.o. female with h/o pacemaker seen today for routine electrophysiology followup.  Since last being seen in our clinic the patient reports doing well.  She has no chest pain or shortness of breath.  She moves all of her daily activities..  she denies chest pain, palpitations, dyspnea, PND, orthopnea, nausea, vomiting, dizziness, syncope, edema, weight gain, or early satiety.   Review of systems complete and found to be negative unless listed in HPI.   Device History: Medtronic Dual Chamber PPM implanted 03/09/20 for CHB   Studies Reviewed:    PPM Interrogation-  reviewed in detail today,  See PACEART report.  EKG is ordered today. Personal review as below.  EKG Interpretation  Date/Time:  Monday February 12 2023 14:54:16 EDT Ventricular Rate:  74 PR Interval:  222 QRS Duration: 122 QT Interval:  414 QTC Calculation: 459 R Axis:   -63 Text Interpretation: Atrial-sensed ventricular-paced rhythm with prolonged AV conduction When compared with ECG of 26-Apr-2022 06:54, No significant change was found Confirmed by Irisha Grandmaison ([id number]) on 02/12/2023 3:23:29 PM    Risk Assessment/Calculations:      Physical Exam:   VS:  BP (!) 150/86   Pulse 74   Ht 5' (1.524 m)   Wt 173 lb 6.4 oz (78.7 kg)   SpO2 99%   BMI 33.86 kg/m    Wt Readings from Last 3 Encounters:  02/12/23 173 lb 6.4 oz (78.7 kg)  04/25/22 153 lb 4.8 oz (69.5 kg)  11/23/21 154 lb 6.4 oz (70 kg)     GEN: Well nourished, well developed in no acute distress NECK: No JVD; No carotid bruits CARDIAC: Regular rate and rhythm, no murmurs, rubs, gallops RESPIRATORY:  Clear to auscultation without rales, wheezing or rhonchi  ABDOMEN: Soft, non-tender, non-distended EXTREMITIES:  No edema; No  deformity   ASSESSMENT AND PLAN:    1.  CHB s/p Medtronic PPM  Normal PPM function See Pace Art report No changes today  2.  Hypertension: Mildly today.  Usually managed by her facility.  No changes.  3.  Atrial fibrillation: 2 episodes on her device, markers that appear short atrial fibrillation or PACs.  No indication for anticoagulation.  Disposition:   Follow up with EP APP in 12 months  Signed, Tanaja Ganger Jorja Loa, MD

## 2023-04-11 ENCOUNTER — Ambulatory Visit (INDEPENDENT_AMBULATORY_CARE_PROVIDER_SITE_OTHER): Payer: Medicare Other

## 2023-04-11 DIAGNOSIS — I442 Atrioventricular block, complete: Secondary | ICD-10-CM | POA: Diagnosis not present

## 2023-04-11 LAB — CUP PACEART REMOTE DEVICE CHECK
Battery Remaining Longevity: 87 mo
Battery Voltage: 2.99 V
Brady Statistic AP VP Percent: 27.94 %
Brady Statistic AP VS Percent: 0 %
Brady Statistic AS VP Percent: 71.79 %
Brady Statistic AS VS Percent: 0.27 %
Brady Statistic RA Percent Paced: 27.68 %
Brady Statistic RV Percent Paced: 99.73 %
Date Time Interrogation Session: 20240821070905
Implantable Lead Connection Status: 753985
Implantable Lead Connection Status: 753985
Implantable Lead Implant Date: 20210720
Implantable Lead Implant Date: 20210720
Implantable Lead Location: 753859
Implantable Lead Location: 753860
Implantable Lead Model: 5076
Implantable Lead Model: 5076
Implantable Pulse Generator Implant Date: 20210720
Lead Channel Impedance Value: 266 Ohm
Lead Channel Impedance Value: 285 Ohm
Lead Channel Impedance Value: 323 Ohm
Lead Channel Impedance Value: 342 Ohm
Lead Channel Pacing Threshold Amplitude: 0.5 V
Lead Channel Pacing Threshold Amplitude: 0.875 V
Lead Channel Pacing Threshold Pulse Width: 0.4 ms
Lead Channel Pacing Threshold Pulse Width: 0.4 ms
Lead Channel Sensing Intrinsic Amplitude: 1 mV
Lead Channel Sensing Intrinsic Amplitude: 1 mV
Lead Channel Sensing Intrinsic Amplitude: 2.875 mV
Lead Channel Sensing Intrinsic Amplitude: 4.25 mV
Lead Channel Setting Pacing Amplitude: 1.5 V
Lead Channel Setting Pacing Amplitude: 2.5 V
Lead Channel Setting Pacing Pulse Width: 0.4 ms
Lead Channel Setting Sensing Sensitivity: 1.2 mV
Zone Setting Status: 755011

## 2023-04-24 NOTE — Progress Notes (Signed)
Remote pacemaker transmission.   

## 2023-07-11 ENCOUNTER — Ambulatory Visit (INDEPENDENT_AMBULATORY_CARE_PROVIDER_SITE_OTHER): Payer: Medicare Other

## 2023-07-11 DIAGNOSIS — I442 Atrioventricular block, complete: Secondary | ICD-10-CM | POA: Diagnosis not present

## 2023-07-11 LAB — CUP PACEART REMOTE DEVICE CHECK
Battery Remaining Longevity: 83 mo
Battery Voltage: 2.99 V
Brady Statistic AP VP Percent: 27.21 %
Brady Statistic AP VS Percent: 0 %
Brady Statistic AS VP Percent: 72.53 %
Brady Statistic AS VS Percent: 0.26 %
Brady Statistic RA Percent Paced: 26.98 %
Brady Statistic RV Percent Paced: 99.74 %
Date Time Interrogation Session: 20241119203614
Implantable Lead Connection Status: 753985
Implantable Lead Connection Status: 753985
Implantable Lead Implant Date: 20210720
Implantable Lead Implant Date: 20210720
Implantable Lead Location: 753859
Implantable Lead Location: 753860
Implantable Lead Model: 5076
Implantable Lead Model: 5076
Implantable Pulse Generator Implant Date: 20210720
Lead Channel Impedance Value: 266 Ohm
Lead Channel Impedance Value: 285 Ohm
Lead Channel Impedance Value: 304 Ohm
Lead Channel Impedance Value: 342 Ohm
Lead Channel Pacing Threshold Amplitude: 0.5 V
Lead Channel Pacing Threshold Amplitude: 0.75 V
Lead Channel Pacing Threshold Pulse Width: 0.4 ms
Lead Channel Pacing Threshold Pulse Width: 0.4 ms
Lead Channel Sensing Intrinsic Amplitude: 1.5 mV
Lead Channel Sensing Intrinsic Amplitude: 1.5 mV
Lead Channel Sensing Intrinsic Amplitude: 5.75 mV
Lead Channel Sensing Intrinsic Amplitude: 5.75 mV
Lead Channel Setting Pacing Amplitude: 1.5 V
Lead Channel Setting Pacing Amplitude: 2.5 V
Lead Channel Setting Pacing Pulse Width: 0.4 ms
Lead Channel Setting Sensing Sensitivity: 1.2 mV
Zone Setting Status: 755011

## 2023-08-06 NOTE — Progress Notes (Signed)
Remote pacemaker transmission.   

## 2023-10-10 ENCOUNTER — Ambulatory Visit (INDEPENDENT_AMBULATORY_CARE_PROVIDER_SITE_OTHER): Payer: Medicare Other

## 2023-10-10 DIAGNOSIS — I442 Atrioventricular block, complete: Secondary | ICD-10-CM

## 2023-10-11 LAB — CUP PACEART REMOTE DEVICE CHECK
Battery Remaining Longevity: 84 mo
Battery Voltage: 2.98 V
Brady Statistic AP VP Percent: 22.44 %
Brady Statistic AP VS Percent: 0 %
Brady Statistic AS VP Percent: 77.39 %
Brady Statistic AS VS Percent: 0.17 %
Brady Statistic RA Percent Paced: 22.16 %
Brady Statistic RV Percent Paced: 99.82 %
Date Time Interrogation Session: 20250219032803
Implantable Lead Connection Status: 753985
Implantable Lead Connection Status: 753985
Implantable Lead Implant Date: 20210720
Implantable Lead Implant Date: 20210720
Implantable Lead Location: 753859
Implantable Lead Location: 753860
Implantable Lead Model: 5076
Implantable Lead Model: 5076
Implantable Pulse Generator Implant Date: 20210720
Lead Channel Impedance Value: 266 Ohm
Lead Channel Impedance Value: 285 Ohm
Lead Channel Impedance Value: 342 Ohm
Lead Channel Impedance Value: 361 Ohm
Lead Channel Pacing Threshold Amplitude: 0.5 V
Lead Channel Pacing Threshold Amplitude: 0.875 V
Lead Channel Pacing Threshold Pulse Width: 0.4 ms
Lead Channel Pacing Threshold Pulse Width: 0.4 ms
Lead Channel Sensing Intrinsic Amplitude: 1.125 mV
Lead Channel Sensing Intrinsic Amplitude: 1.125 mV
Lead Channel Sensing Intrinsic Amplitude: 5.75 mV
Lead Channel Sensing Intrinsic Amplitude: 5.75 mV
Lead Channel Setting Pacing Amplitude: 1.5 V
Lead Channel Setting Pacing Amplitude: 2.5 V
Lead Channel Setting Pacing Pulse Width: 0.4 ms
Lead Channel Setting Sensing Sensitivity: 1.2 mV
Zone Setting Status: 755011

## 2023-11-14 NOTE — Progress Notes (Signed)
 Remote pacemaker transmission.

## 2023-11-14 NOTE — Addendum Note (Signed)
 Addended by: Elease Etienne A on: 11/14/2023 11:06 AM   Modules accepted: Orders

## 2023-12-08 ENCOUNTER — Emergency Department (HOSPITAL_COMMUNITY)

## 2023-12-08 ENCOUNTER — Emergency Department (HOSPITAL_COMMUNITY)
Admission: EM | Admit: 2023-12-08 | Discharge: 2023-12-08 | Disposition: A | Discharge status: Home / Self Care | Attending: Emergency Medicine | Admitting: Emergency Medicine

## 2023-12-08 ENCOUNTER — Encounter (HOSPITAL_COMMUNITY): Payer: Self-pay

## 2023-12-08 ENCOUNTER — Other Ambulatory Visit: Payer: Self-pay

## 2023-12-08 DIAGNOSIS — E119 Type 2 diabetes mellitus without complications: Secondary | ICD-10-CM | POA: Insufficient documentation

## 2023-12-08 DIAGNOSIS — Z79899 Other long term (current) drug therapy: Secondary | ICD-10-CM | POA: Insufficient documentation

## 2023-12-08 DIAGNOSIS — I10 Essential (primary) hypertension: Secondary | ICD-10-CM | POA: Diagnosis not present

## 2023-12-08 DIAGNOSIS — W19XXXA Unspecified fall, initial encounter: Secondary | ICD-10-CM | POA: Insufficient documentation

## 2023-12-08 DIAGNOSIS — J45909 Unspecified asthma, uncomplicated: Secondary | ICD-10-CM | POA: Insufficient documentation

## 2023-12-08 DIAGNOSIS — F0390 Unspecified dementia without behavioral disturbance: Secondary | ICD-10-CM | POA: Diagnosis not present

## 2023-12-08 DIAGNOSIS — S0990XA Unspecified injury of head, initial encounter: Secondary | ICD-10-CM | POA: Diagnosis present

## 2023-12-08 DIAGNOSIS — Y92019 Unspecified place in single-family (private) house as the place of occurrence of the external cause: Secondary | ICD-10-CM | POA: Diagnosis not present

## 2023-12-08 DIAGNOSIS — R519 Headache, unspecified: Secondary | ICD-10-CM

## 2023-12-08 LAB — URINALYSIS, W/ REFLEX TO CULTURE (INFECTION SUSPECTED)
Bilirubin Urine: NEGATIVE
Glucose, UA: NEGATIVE mg/dL
Ketones, ur: NEGATIVE mg/dL
Nitrite: NEGATIVE
Protein, ur: 30 mg/dL — AB
Specific Gravity, Urine: 1.009 (ref 1.005–1.030)
pH: 7 (ref 5.0–8.0)

## 2023-12-08 LAB — BASIC METABOLIC PANEL WITH GFR
Anion gap: 9 (ref 5–15)
BUN: 19 mg/dL (ref 8–23)
CO2: 23 mmol/L (ref 22–32)
Calcium: 9 mg/dL (ref 8.9–10.3)
Chloride: 111 mmol/L (ref 98–111)
Creatinine, Ser: 1.31 mg/dL — ABNORMAL HIGH (ref 0.44–1.00)
GFR, Estimated: 40 mL/min — ABNORMAL LOW (ref >60)
Glucose, Bld: 99 mg/dL (ref 70–99)
Potassium: 4.1 mmol/L (ref 3.5–5.1)
Sodium: 143 mmol/L (ref 135–145)

## 2023-12-08 LAB — CBC WITH DIFFERENTIAL/PLATELET
Abs Immature Granulocytes: 0.02 10*3/uL (ref 0.00–0.07)
Basophils Absolute: 0 10*3/uL (ref 0.0–0.1)
Basophils Relative: 0 %
Eosinophils Absolute: 0.1 10*3/uL (ref 0.0–0.5)
Eosinophils Relative: 1 %
HCT: 37.8 % (ref 36.0–46.0)
Hemoglobin: 11.9 g/dL — ABNORMAL LOW (ref 12.0–15.0)
Immature Granulocytes: 0 %
Lymphocytes Relative: 24 %
Lymphs Abs: 1.6 10*3/uL (ref 0.7–4.0)
MCH: 27.5 pg (ref 26.0–34.0)
MCHC: 31.5 g/dL (ref 30.0–36.0)
MCV: 87.5 fL (ref 80.0–100.0)
Monocytes Absolute: 0.6 10*3/uL (ref 0.1–1.0)
Monocytes Relative: 8 %
Neutro Abs: 4.5 10*3/uL (ref 1.7–7.7)
Neutrophils Relative %: 67 %
Platelets: 272 10*3/uL (ref 150–400)
RBC: 4.32 MIL/uL (ref 3.87–5.11)
RDW: 13.9 % (ref 11.5–15.5)
WBC: 6.8 10*3/uL (ref 4.0–10.5)
nRBC: 0 % (ref 0.0–0.2)

## 2023-12-08 LAB — TROPONIN I (HIGH SENSITIVITY): Troponin I (High Sensitivity): 15 ng/L (ref <18)

## 2023-12-08 LAB — CBG MONITORING, ED: Glucose-Capillary: 88 mg/dL (ref 70–99)

## 2023-12-08 MED ORDER — HYDRALAZINE HCL 25 MG PO TABS
25.0000 mg | ORAL_TABLET | Freq: Once | ORAL | Status: AC
  Administered 2023-12-08: 25 mg via ORAL
  Filled 2023-12-08: qty 1

## 2023-12-08 MED ORDER — HYDRALAZINE HCL 25 MG PO TABS: 25.0000 mg | ORAL_TABLET | Freq: Three times a day (TID) | ORAL | 0 refills | Status: DC

## 2023-12-08 NOTE — ED Notes (Signed)
 RN attempted to call Gail Lynch legal guardian without answer

## 2023-12-08 NOTE — Discharge Instructions (Signed)
 You were seen in the emergency department today for headache after fall.  Your labs look normal and CT scans did not show any broken bones or bleeding.  I am restarting your blood pressure medication, hydralazine .  Your blood pressures were elevated here and I think you should be on some regular medicines.  Please continue to follow closely with your primary care doctor, ideally in the next week for blood pressure rechecks and reviewed your ED visit and symptoms.

## 2023-12-08 NOTE — ED Notes (Signed)
 Pt removed bedpan from underneath her and used call bell to alert the nurse.

## 2023-12-08 NOTE — ED Notes (Signed)
PTAR called no ETA

## 2023-12-08 NOTE — ED Provider Notes (Signed)
 Emergency Department Provider Note   I have reviewed the triage vital signs and the nursing notes.   HISTORY  Chief Complaint Fall   HPI Gail Lynch is a 83 y.o. female with past history of dementia presents to the emergency department for evaluation after unwitnessed fall yesterday.  She is a resident at [facility].  EMS were called out today as patient complained of headache and feeling dizzy.  At baseline she uses a walker to assist.  Level 5 caveat applies with dementia.   Past Medical History:  Diagnosis Date   Acute lower GI bleeding 05/03/2020   Arthritis    gout in feet   Asthma    hx as a child - no inhaler, no problems   Carotid stenosis 06/18/2012   Colon polyps    Diabetes mellitus    Type 2   Dysplasia of cervix, low grade (CIN 1)    s/p cervical conization   Glaucoma    Glaucoma 12/18/2011   Hard of hearing    hear aids - left   HPV (human papilloma virus) infection    Hyperlipidemia    Hypertension    Intrinsic asthma, unspecified 12/18/2011   Mild aortic stenosis 03/14/2017   Mycotic toenails 02/19/2013   PONV (postoperative nausea and vomiting)    SVD (spontaneous vaginal delivery)    x 4    Review of Systems  Level 5 caveat: Dementia ____________________________________________   PHYSICAL EXAM:  VITAL SIGNS: ED Triage Vitals  Encounter Vitals Group     BP 12/08/23 0932 (!) 186/89     Pulse Rate 12/08/23 0932 83     Resp 12/08/23 0932 16     Temp 12/08/23 0932 98.3 F (36.8 C)     Temp Source 12/08/23 0932 Oral     SpO2 12/08/23 0932 100 %     Weight 12/08/23 0940 176 lb 5.9 oz (80 kg)     Height 12/08/23 0934 5\' 3"  (1.6 m)   Constitutional: Alert with baseline confusion. Well appearing and in no acute distress. Eyes: Conjunctivae are normal.  Head: Atraumatic. Nose: No congestion/rhinnorhea. Mouth/Throat: Mucous membranes are moist.  Neck: No stridor.   Cardiovascular: Normal rate, regular rhythm. Good peripheral  circulation. Grossly normal heart sounds.   Respiratory: Normal respiratory effort.  No retractions. Lungs CTAB. Gastrointestinal: Soft and nontender. No distention.  Musculoskeletal: No lower extremity tenderness nor edema. No gross deformities of extremities. Neurologic:  Normal speech and language. No gross focal neurologic deficits are appreciated.  Skin:  Skin is warm, dry and intact. No rash noted. ____________________________________________   LABS (all labs ordered are listed, but only abnormal results are displayed)  Labs Reviewed  URINE CULTURE - Abnormal; Notable for the following components:      Result Value   Culture MULTIPLE SPECIES PRESENT, SUGGEST RECOLLECTION (*)    All other components within normal limits  CBC WITH DIFFERENTIAL/PLATELET - Abnormal; Notable for the following components:   Hemoglobin 11.9 (*)    All other components within normal limits  BASIC METABOLIC PANEL WITH GFR - Abnormal; Notable for the following components:   Creatinine, Ser 1.31 (*)    GFR, Estimated 40 (*)    All other components within normal limits  URINALYSIS, W/ REFLEX TO CULTURE (INFECTION SUSPECTED) - Abnormal; Notable for the following components:   Hgb urine dipstick SMALL (*)    Protein, ur 30 (*)    Leukocytes,Ua TRACE (*)    Bacteria, UA RARE (*)  All other components within normal limits  CBG MONITORING, ED  TROPONIN I (HIGH SENSITIVITY)   ____________________________________________  EKG   EKG Interpretation Date/Time:  Saturday December 08 2023 10:14:25 EDT Ventricular Rate:  82 PR Interval:  207 QRS Duration:  168 QT Interval:  449 QTC Calculation: 525 R Axis:   -54  Text Interpretation: Sinus rhythm Left bundle branch block Confirmed by Abby Hocking [phone]) on 12/08/2023 10:20:01 AM        ____________________________________________   PROCEDURES  Procedure(s) performed:   Procedures  None   ____________________________________________   INITIAL IMPRESSION / ASSESSMENT AND PLAN / ED COURSE  Pertinent labs & imaging results that were available during my care of the patient were reviewed by me and considered in my medical decision making (see chart for details).   This patient is Presenting for Evaluation of fall/HA, which does require a range of treatment options, and is a complaint that involves a high risk of morbidity and mortality.  The Differential Diagnoses includes subdural hematoma, epidural hematoma, acute concussion, traumatic subarachnoid hemorrhage, cerebral contusions, etc.   Critical Interventions-    Medications  hydrALAZINE  (APRESOLINE ) tablet 25 mg (25 mg Oral Given 12/08/23 1338)    Reassessment after intervention: BP improved.    I did obtain Additional Historical Information from EMS, as the patient has dementia.    Clinical Laboratory Tests Ordered, included UA without infection.  No acute kidney injury.  CBC without leukocytosis.  Radiologic Tests Ordered, included CT head, c spine, and XR. I independently interpreted the images and agree with radiology interpretation.   Medical Decision Making: Summary:  The patient presents emergency department valuation after unwitnessed fall yesterday.  Feeling some dizziness and headache today.  Plan for imaging, screening blood reassess.  Reevaluation with update and discussion with CT head and C-spine reassuring.  UA without clear infection.  Plan to send for culture.  Patient appears to be at her mental status baseline and stable for discharge.  Patient's presentation is most consistent with acute presentation with potential threat to life or bodily function.   Disposition: discharge  ____________________________________________  FINAL CLINICAL IMPRESSION(S) / ED DIAGNOSES  Final diagnoses:  Injury of head, initial encounter  Acute nonintractable headache, unspecified headache type  Asymptomatic  hypertension   Note:  This document was prepared using Dragon voice recognition software and may include unintentional dictation errors.  Abby Hocking, MD, [facility] Emergency Medicine    Jacalynn Buzzell, Shereen Dike, MD 12/19/23 [phone]

## 2023-12-08 NOTE — ED Notes (Signed)
RN assisted pt on bedpan 

## 2023-12-08 NOTE — ED Triage Notes (Addendum)
 Pt to ED via [facility] from [facility] c/o unwitnessed  fall yesterday afternoon. Hx dementia, orientation at baseline. EMS called today due to pt c/o feeling dizzy and HA. Uses walker to assist with ambulation   Last VS: 136/92, hr73, cbg 104

## 2023-12-08 NOTE — ED Notes (Signed)
 Patient transported to CT

## 2023-12-10 ENCOUNTER — Observation Stay (HOSPITAL_COMMUNITY)
Admission: EM | Admit: 2023-12-10 | Discharge: 2023-12-13 | Disposition: A | Discharge status: Skilled Nursing Facility | Attending: Internal Medicine | Admitting: Internal Medicine

## 2023-12-10 ENCOUNTER — Other Ambulatory Visit: Payer: Self-pay

## 2023-12-10 ENCOUNTER — Encounter (HOSPITAL_COMMUNITY): Payer: Self-pay

## 2023-12-10 ENCOUNTER — Observation Stay (HOSPITAL_COMMUNITY)

## 2023-12-10 DIAGNOSIS — I129 Hypertensive chronic kidney disease with stage 1 through stage 4 chronic kidney disease, or unspecified chronic kidney disease: Secondary | ICD-10-CM | POA: Diagnosis not present

## 2023-12-10 DIAGNOSIS — E1122 Type 2 diabetes mellitus with diabetic chronic kidney disease: Secondary | ICD-10-CM | POA: Diagnosis not present

## 2023-12-10 DIAGNOSIS — N1831 Chronic kidney disease, stage 3a: Secondary | ICD-10-CM | POA: Insufficient documentation

## 2023-12-10 DIAGNOSIS — R42 Dizziness and giddiness: Secondary | ICD-10-CM | POA: Diagnosis present

## 2023-12-10 DIAGNOSIS — Z87891 Personal history of nicotine dependence: Secondary | ICD-10-CM | POA: Diagnosis not present

## 2023-12-10 DIAGNOSIS — N179 Acute kidney failure, unspecified: Secondary | ICD-10-CM | POA: Diagnosis not present

## 2023-12-10 DIAGNOSIS — R2990 Unspecified symptoms and signs involving the nervous system: Secondary | ICD-10-CM | POA: Diagnosis not present

## 2023-12-10 DIAGNOSIS — E66811 Obesity, class 1: Secondary | ICD-10-CM | POA: Insufficient documentation

## 2023-12-10 DIAGNOSIS — R29818 Other symptoms and signs involving the nervous system: Principal | ICD-10-CM | POA: Insufficient documentation

## 2023-12-10 DIAGNOSIS — R531 Weakness: Secondary | ICD-10-CM | POA: Diagnosis present

## 2023-12-10 DIAGNOSIS — F0390 Unspecified dementia without behavioral disturbance: Secondary | ICD-10-CM | POA: Insufficient documentation

## 2023-12-10 DIAGNOSIS — Z950 Presence of cardiac pacemaker: Secondary | ICD-10-CM | POA: Insufficient documentation

## 2023-12-10 DIAGNOSIS — Z1152 Encounter for screening for COVID-19: Secondary | ICD-10-CM | POA: Diagnosis not present

## 2023-12-10 DIAGNOSIS — Z794 Long term (current) use of insulin: Secondary | ICD-10-CM | POA: Insufficient documentation

## 2023-12-10 DIAGNOSIS — R1310 Dysphagia, unspecified: Secondary | ICD-10-CM | POA: Insufficient documentation

## 2023-12-10 DIAGNOSIS — J45909 Unspecified asthma, uncomplicated: Secondary | ICD-10-CM | POA: Insufficient documentation

## 2023-12-10 DIAGNOSIS — Z6831 Body mass index (BMI) 31.0-31.9, adult: Secondary | ICD-10-CM | POA: Diagnosis not present

## 2023-12-10 DIAGNOSIS — R4701 Aphasia: Secondary | ICD-10-CM | POA: Insufficient documentation

## 2023-12-10 DIAGNOSIS — Z79899 Other long term (current) drug therapy: Secondary | ICD-10-CM | POA: Insufficient documentation

## 2023-12-10 LAB — URINALYSIS, W/ REFLEX TO CULTURE (INFECTION SUSPECTED)
Bacteria, UA: NONE SEEN
Bilirubin Urine: NEGATIVE
Glucose, UA: NEGATIVE mg/dL
Hgb urine dipstick: NEGATIVE
Ketones, ur: NEGATIVE mg/dL
Leukocytes,Ua: NEGATIVE
Nitrite: NEGATIVE
Protein, ur: NEGATIVE mg/dL
Specific Gravity, Urine: 1.009 (ref 1.005–1.030)
pH: 6 (ref 5.0–8.0)

## 2023-12-10 LAB — DIFFERENTIAL
Abs Immature Granulocytes: 0.02 10*3/uL (ref 0.00–0.07)
Basophils Absolute: 0 10*3/uL (ref 0.0–0.1)
Basophils Relative: 0 %
Eosinophils Absolute: 0.1 10*3/uL (ref 0.0–0.5)
Eosinophils Relative: 1 %
Immature Granulocytes: 0 %
Lymphocytes Relative: 23 %
Lymphs Abs: 1.6 10*3/uL (ref 0.7–4.0)
Monocytes Absolute: 0.5 10*3/uL (ref 0.1–1.0)
Monocytes Relative: 8 %
Neutro Abs: 4.7 10*3/uL (ref 1.7–7.7)
Neutrophils Relative %: 68 %

## 2023-12-10 LAB — CBC
HCT: 38.4 % (ref 36.0–46.0)
Hemoglobin: 11.8 g/dL — ABNORMAL LOW (ref 12.0–15.0)
MCH: 27 pg (ref 26.0–34.0)
MCHC: 30.7 g/dL (ref 30.0–36.0)
MCV: 87.9 fL (ref 80.0–100.0)
Platelets: 276 10*3/uL (ref 150–400)
RBC: 4.37 MIL/uL (ref 3.87–5.11)
RDW: 14 % (ref 11.5–15.5)
WBC: 6.9 10*3/uL (ref 4.0–10.5)
nRBC: 0 % (ref 0.0–0.2)

## 2023-12-10 LAB — RESP PANEL BY RT-PCR (RSV, FLU A&B, COVID)  RVPGX2
Influenza A by PCR: NEGATIVE
Influenza B by PCR: NEGATIVE
Resp Syncytial Virus by PCR: NEGATIVE
SARS Coronavirus 2 by RT PCR: NEGATIVE

## 2023-12-10 LAB — LIPID PANEL
Cholesterol: 233 mg/dL — ABNORMAL HIGH (ref 0–200)
HDL: 52 mg/dL (ref >40)
LDL Cholesterol: 155 mg/dL — ABNORMAL HIGH (ref 0–99)
Total CHOL/HDL Ratio: 4.5 ratio
Triglycerides: 129 mg/dL (ref <150)
VLDL: 26 mg/dL (ref 0–40)

## 2023-12-10 LAB — URINE CULTURE

## 2023-12-10 LAB — COMPREHENSIVE METABOLIC PANEL WITH GFR
ALT: 8 U/L (ref 0–44)
AST: 16 U/L (ref 15–41)
Albumin: 3.1 g/dL — ABNORMAL LOW (ref 3.5–5.0)
Alkaline Phosphatase: 67 U/L (ref 38–126)
Anion gap: 11 (ref 5–15)
BUN: 25 mg/dL — ABNORMAL HIGH (ref 8–23)
CO2: 23 mmol/L (ref 22–32)
Calcium: 8.8 mg/dL — ABNORMAL LOW (ref 8.9–10.3)
Chloride: 105 mmol/L (ref 98–111)
Creatinine, Ser: 1.53 mg/dL — ABNORMAL HIGH (ref 0.44–1.00)
GFR, Estimated: 33 mL/min — ABNORMAL LOW (ref >60)
Glucose, Bld: 190 mg/dL — ABNORMAL HIGH (ref 70–99)
Potassium: 4.1 mmol/L (ref 3.5–5.1)
Sodium: 139 mmol/L (ref 135–145)
Total Bilirubin: 0.6 mg/dL (ref 0.0–1.2)
Total Protein: 6.8 g/dL (ref 6.5–8.1)

## 2023-12-10 LAB — CBG MONITORING, ED
Glucose-Capillary: 137 mg/dL — ABNORMAL HIGH (ref 70–99)
Glucose-Capillary: 135 mg/dL — ABNORMAL HIGH (ref 70–99)

## 2023-12-10 LAB — PROTIME-INR
INR: 1 (ref 0.8–1.2)
Prothrombin Time: 13.6 s (ref 11.4–15.2)

## 2023-12-10 LAB — CK: Total CK: 147 U/L (ref 38–234)

## 2023-12-10 LAB — TSH: TSH: 2.153 u[IU]/mL (ref 0.350–4.500)

## 2023-12-10 MED ORDER — STROKE: EARLY STAGES OF RECOVERY BOOK
Freq: Once | Status: DC
Start: 2023-12-11 — End: 2023-12-11
  Filled 2023-12-10: qty 1

## 2023-12-10 MED ORDER — ACETAMINOPHEN 650 MG RE SUPP
650.0000 mg | RECTAL | Status: DC | PRN
Start: 2023-12-10 — End: 2023-12-13

## 2023-12-10 MED ORDER — BISACODYL 10 MG RE SUPP: 10.0000 mg | Freq: Every day | RECTAL | Status: DC | PRN

## 2023-12-10 MED ORDER — ACETAMINOPHEN 325 MG PO TABS: 650.0000 mg | ORAL_TABLET | ORAL | Status: DC | PRN

## 2023-12-10 MED ORDER — ENOXAPARIN SODIUM 30 MG/0.3ML IJ SOSY
30.0000 mg | PREFILLED_SYRINGE | INTRAMUSCULAR | Status: DC
  Administered 2023-12-10 – 2023-12-12 (×3): 30 mg via SUBCUTANEOUS
  Filled 2023-12-10 (×3): qty 0.3

## 2023-12-10 MED ORDER — GLUCERNA SHAKE PO LIQD
237.0000 mL | Freq: Three times a day (TID) | ORAL | Status: DC
  Administered 2023-12-11: 237 mL via ORAL
  Filled 2023-12-10 (×3): qty 237

## 2023-12-10 MED ORDER — LACTATED RINGERS IV BOLUS
500.0000 mL | Freq: Once | INTRAVENOUS | Status: AC
  Administered 2023-12-10: 500 mL via INTRAVENOUS

## 2023-12-10 MED ORDER — ALBUTEROL SULFATE (2.5 MG/3ML) 0.083% IN NEBU: 3.0000 mL | INHALATION_SOLUTION | RESPIRATORY_TRACT | Status: DC | PRN

## 2023-12-10 MED ORDER — BISACODYL 5 MG PO TBEC
10.0000 mg | DELAYED_RELEASE_TABLET | Freq: Every day | ORAL | Status: DC
  Administered 2023-12-11 – 2023-12-12 (×3): 10 mg via ORAL
  Filled 2023-12-10 (×3): qty 2

## 2023-12-10 MED ORDER — ACETAMINOPHEN 160 MG/5ML PO SOLN: 650.0000 mg | ORAL | Status: DC | PRN

## 2023-12-10 MED ORDER — ATORVASTATIN CALCIUM 40 MG PO TABS
40.0000 mg | ORAL_TABLET | Freq: Every day | ORAL | Status: DC
  Administered 2023-12-10 – 2023-12-11 (×2): 40 mg via ORAL
  Filled 2023-12-10 (×2): qty 1

## 2023-12-10 MED ORDER — INSULIN ASPART 100 UNIT/ML IJ SOLN
0.0000 [IU] | Freq: Three times a day (TID) | INTRAMUSCULAR | Status: DC
  Administered 2023-12-10: 2 [IU] via SUBCUTANEOUS
  Administered 2023-12-11: 5 [IU] via SUBCUTANEOUS
  Administered 2023-12-11 (×2): 3 [IU] via SUBCUTANEOUS
  Administered 2023-12-12: 5 [IU] via SUBCUTANEOUS
  Administered 2023-12-12: 3 [IU] via SUBCUTANEOUS
  Administered 2023-12-12: 8 [IU] via SUBCUTANEOUS
  Administered 2023-12-13: 3 [IU] via SUBCUTANEOUS
  Administered 2023-12-13: 5 [IU] via SUBCUTANEOUS
  Filled 2023-12-10: qty 0.15

## 2023-12-10 MED ORDER — SODIUM CHLORIDE 0.9 % IV SOLN: INTRAVENOUS | Status: AC

## 2023-12-10 MED ORDER — ROSUVASTATIN CALCIUM 20 MG PO TABS: 20.0000 mg | ORAL_TABLET | Freq: Every day | ORAL | Status: DC

## 2023-12-10 MED ORDER — POLYETHYLENE GLYCOL 3350 17 G PO PACK
17.0000 g | PACK | Freq: Two times a day (BID) | ORAL | Status: DC
  Administered 2023-12-10 – 2023-12-13 (×7): 17 g via ORAL
  Filled 2023-12-10 (×7): qty 1

## 2023-12-10 MED ORDER — HYDRALAZINE HCL 20 MG/ML IJ SOLN
10.0000 mg | Freq: Four times a day (QID) | INTRAMUSCULAR | Status: DC | PRN
  Filled 2023-12-10: qty 1

## 2023-12-10 MED ORDER — SENNOSIDES-DOCUSATE SODIUM 8.6-50 MG PO TABS: 1.0000 | ORAL_TABLET | Freq: Every evening | ORAL | Status: DC | PRN

## 2023-12-10 MED ORDER — HYDROCORTISONE ACETATE 25 MG RE SUPP: 25.0000 mg | Freq: Two times a day (BID) | RECTAL | Status: DC | PRN

## 2023-12-10 NOTE — Progress Notes (Signed)
 PT Note  Patient Details Name: Gail Lynch MRN: [id number] DOB: 07-08-1941   Pt in [facility] ED at this time waiting transfer to [facility] for MRI according to chart, Will hold at this time, will continue to assess when medically ready.      Jim Motts 12/10/2023, 5:09 PM Cheral Cordoba, PT, MPT Acute Rehabilitation Services Office: [phone] If a weekend: secure chat groups: [facility] PT, [facility] OT, [facility] SLP 12/10/2023

## 2023-12-10 NOTE — ED Notes (Signed)
 Interrogated pace maker and notified MD it is complete

## 2023-12-10 NOTE — ED Provider Notes (Signed)
 [name] Provider Note   CSN: [id number] Arrival date & time: 12/10/23  1027     History  Chief Complaint  Patient presents with   Weakness    Gail Lynch is a 83 y.o. female.  HPI Patient presents for right arm weakness.  Medical history includes HTN, HLD, DM, CKD, aortic stenosis, AV block s/p pacemaker placement, dementia, anemia.  She was seen in the ED 2 days ago following an unwitnessed fall.  She underwent negative workup and was discharged back to facility.  Facility has noted that she has had some right arm weakness since that time.  This morning, she had an episode of transient lightheadedness with standing.  This reportedly resolved after she ate some food.  With EMS, CBG was in the 200s.  Vital signs were notable for hypertension.  Patient currently denies any areas of discomfort.    Home Medications Prior to Admission medications   Medication Sig Start Date End Date Taking? Authorizing Provider  acetaminophen  (TYLENOL ) 500 MG tablet Take 500 mg by mouth every 6 (six) hours as needed for mild pain, headache or fever. Patient not taking: Reported on 02/12/2023    [provider]  albuterol  (VENTOLIN  HFA) 108 (90 Base) MCG/ACT inhaler Inhale 2 puffs into the lungs every 4 (four) hours as needed for wheezing or shortness of breath. 05/01/22   Vann, Jessica U, DO  alum & mag hydroxide-simeth (MAALOX/MYLANTA) 200-200-20 MG/5ML suspension Take 30 mLs by mouth every 6 (six) hours as needed for indigestion or heartburn. Patient not taking: Reported on 02/12/2023    [provider]  docusate sodium  (COLACE) 100 MG capsule Take 100 mg by mouth daily. 01/16/23   [provider]  feeding supplement, GLUCERNA SHAKE, (GLUCERNA SHAKE) LIQD Take 237 mLs by mouth 3 (three) times daily between meals. 05/01/22   Vann, Jessica U, DO  glucose blood (ONETOUCH VERIO) test strip 1 each by Other route 2 (two) times daily. And  lancets 2/day 08/03/20   Gwyndolyn Lerner, MD  guaiFENesin  (ROBITUSSIN) 100 MG/5ML liquid Take 5 mLs by mouth every 6 (six) hours as needed for cough or to loosen phlegm. Patient not taking: Reported on 02/12/2023    [provider]  hydrALAZINE  (APRESOLINE ) 25 MG tablet Take 1 tablet (25 mg total) by mouth 3 (three) times daily. 12/08/23 01/07/24  Long, Shereen Dike, MD  hydrochlorothiazide  (MICROZIDE ) 12.5 MG capsule Take 1 capsule (12.5 mg total) by mouth daily. 02/04/21 02/04/22  Roslyn Coombe, MD  hydrocortisone  (ANUSOL -HC) 25 MG suppository Place 1 suppository (25 mg total) rectally 2 (two) times daily. Patient not taking: Reported on 02/12/2023 05/01/22   Vann, Jessica U, DO  Lancets [facility] DELICA PLUS [place]) MISC Test bid 12/28/20   [provider]  LANTUS  SOLOSTAR 100 UNIT/ML Solostar Pen Inject 15 Units into the skin daily. 09/05/21   [provider]  leptospermum manuka honey (MEDIHONEY) PSTE paste Apply 1 Application topically daily. 05/01/22   Vann, Jessica U, DO  loperamide  (IMODIUM ) 2 MG capsule Take 2 mg by mouth as needed for diarrhea or loose stools. Patient not taking: Reported on 02/12/2023    [provider]  magnesium  hydroxide (MILK OF MAGNESIA) 400 MG/5ML suspension Take 30 mLs by mouth at bedtime as needed for mild constipation. Patient not taking: Reported on 02/12/2023    [provider]  OPTIVE 0.5-0.9 % ophthalmic solution Place 1 drop into both eyes 2 (two) times daily. 01/16/23   [provider]  RESTASIS  0.05 % ophthalmic emulsion Place 1 drop into both eyes 2 (two) times daily. 01/29/23   [provider]  risperiDONE  (RISPERDAL ) 1 MG/ML oral solution Take 1 mL (1 mg total) by mouth 2 (two) times daily. 05/01/22   Vann, Jessica U, DO  rosuvastatin  (CRESTOR ) 40 MG tablet Take 1 tablet (40 mg total) by mouth daily. Patient not taking: Reported on 02/12/2023 05/01/22 05/11/23  Vann, Jessica U, DO  STIMULANT LAXATIVE 8.6-50  MG tablet Take 1 tablet by mouth daily. Patient not taking: Reported on 02/12/2023 01/16/23   [provider]  zinc  sulfate 220 (50 Zn) MG capsule Take 1 capsule (220 mg total) by mouth daily. Patient not taking: Reported on 02/12/2023 05/01/22   Vann, Jessica U, DO      Allergies    Aspirin and Penicillins    Review of Systems   Review of Systems  Unable to perform ROS: Dementia  Neurological:  Positive for weakness.    Physical Exam Updated Vital Signs BP (!) 177/70   Pulse 77   Temp 97.6 F (36.4 C) (Oral)   Resp 11   Ht 5\' 3"  (1.6 m)   Wt 80 kg   SpO2 100%   BMI 31.24 kg/m  Physical Exam Vitals and nursing note reviewed.  Constitutional:      General: She is not in acute distress.    Appearance: Normal appearance. She is well-developed. She is not ill-appearing, toxic-appearing or diaphoretic.  HENT:     Head: Normocephalic and atraumatic.     Right Ear: External ear normal.     Left Ear: External ear normal.     Nose: Nose normal.     Mouth/Throat:     Mouth: Mucous membranes are moist.  Eyes:     Extraocular Movements: Extraocular movements intact.     Conjunctiva/sclera: Conjunctivae normal.  Cardiovascular:     Rate and Rhythm: Normal rate and regular rhythm.  Pulmonary:     Effort: Pulmonary effort is normal. No respiratory distress.     Breath sounds: No wheezing or rales.  Abdominal:     General: There is no distension.     Palpations: Abdomen is soft.     Tenderness: There is no abdominal tenderness.  Musculoskeletal:        General: No swelling, tenderness or deformity. Normal range of motion.     Cervical back: Normal range of motion and neck supple.  Skin:    General: Skin is warm and dry.     Capillary Refill: Capillary refill takes less than 2 seconds.     Coloration: Skin is not jaundiced or pale.  Neurological:     Mental Status: She is alert. She is disoriented.     Cranial Nerves: Cranial nerves 2-12 are intact. No dysarthria or  facial asymmetry.     Motor: Weakness and pronator drift present.     Coordination: Coordination is intact. Finger-Nose-Finger Test normal.  Psychiatric:        Mood and Affect: Mood normal.        Behavior: Behavior normal.     ED Results / Procedures / Treatments   Labs (all labs ordered are listed, but only abnormal results are displayed) Labs Reviewed  CBC - Abnormal; Notable for the following components:      Result Value   Hemoglobin 11.8 (*)    All other components within normal limits  COMPREHENSIVE METABOLIC PANEL WITH GFR - Abnormal; Notable for the following components:  Glucose, Bld 190 (*)    BUN 25 (*)    Creatinine, Ser 1.53 (*)    Calcium  8.8 (*)    Albumin  3.1 (*)    GFR, Estimated 33 (*)    All other components within normal limits  RESP PANEL BY RT-PCR (RSV, FLU A&B, COVID)  RVPGX2  DIFFERENTIAL  URINALYSIS, W/ REFLEX TO CULTURE (INFECTION SUSPECTED)  PROTIME-INR    EKG EKG Interpretation Date/Time:  Monday December 10 2023 10:51:04 EDT Ventricular Rate:  70 PR Interval:  199 QRS Duration:  168 QT Interval:  462 QTC Calculation: 499 R Axis:   -60  Text Interpretation: Atrial-sensed ventricular-paced rhythm Confirmed by Iva Mariner ([id number]) on 12/10/2023 10:59:01 AM  Radiology No results found.   Procedures Procedures    Medications Ordered in ED Medications  lactated ringers  bolus 500 mL (has no administration in time range)    ED Course/ Medical Decision Making/ A&P                                 Medical Decision Making Amount and/or Complexity of Data Reviewed Labs: ordered. Radiology: ordered.  Risk Decision regarding hospitalization.   This patient presents to the ED for concern of right arm weakness, this involves an extensive number of treatment options, and is a complaint that carries with it a high risk of complications and morbidity.  The differential diagnosis includes CVA, neoplasm, seizure, ICH, infection, metabolic  derangements   Co morbidities that complicate the patient evaluation  HTN, HLD, DM, CKD, aortic stenosis, AV block s/p pacemaker placement, dementia, anemia   Additional history obtained:  Additional history obtained from EMS External records from outside source obtained and reviewed including EMR   Lab Tests:  I Ordered, and personally interpreted labs.  The pertinent results include: Creatinine is mildly increased from baseline, anemia is baseline.  No leukocytosis is present.  No evidence of UTI.   Imaging Studies ordered:  I ordered imaging studies including CT head, MRI/MRI brain I independently visualized and interpreted imaging which showed pending at time of admission I agree with the radiologist interpretation   Cardiac Monitoring: / EKG:  The patient was maintained on a cardiac monitor.  I personally viewed and interpreted the cardiac monitored which showed an underlying rhythm of: Sinus rhythm   Problem List / ED Course / Critical interventions / Medication management  Patient presenting for concern of right arm weakness.  On arrival in the ED, she is awake and alert.  History is limited by her dementia.  On neuroexam, patient does have a right arm pronator drift.  She does appear to have very slight weakness in right arm and leg.  This does raise concern for subacute stroke.  Patient is outside of window for code stroke initiation.  Workup was initiated.  Patient has implanted pacemaker which will limit MRI availability.  Labwork shows creatinine slightly increased from baseline.  Unfortunately, patient not a candidate for IV contrast.  Noncontrasted CT scan of head was ordered.  IV fluids were ordered for hydration.  Given her symptoms, if the patient does need further stroke workup.  I spoke with neurologist on-call, Dr. Doretta Gant, who agrees.  Patient to be [facility] to [facility].  MRIs were ordered.  Patient was admitted for further management. I ordered medication  including IV fluids for hydration Reevaluation of the patient after these medicines showed that the patient improved I have reviewed the patients  home medicines and have made adjustments as needed   Consultations Obtained:  I requested consultation with the neurologist, Dr. Doretta Gant,  and discussed lab and imaging findings as well as pertinent plan - they recommend: Admission to [facility] for stroke workup   Social Determinants of Health:  Has dementia and poor hearing.  Resides in nursing facility        Final Clinical Impression(s) / ED Diagnoses Final diagnoses:  Stroke-like symptoms    Rx / DC Orders ED Discharge Orders     None         Iva Mariner, MD 12/10/23 1341

## 2023-12-10 NOTE — ED Notes (Signed)
 Called [facility] assisted living and they are going to call back with the pacer type

## 2023-12-10 NOTE — ED Triage Notes (Signed)
 PT BIB [facility] pt had fall on Friday and has been complaining about right arm weakness. When pt stood up she said she felt weak. CBG 227. Pt says she felt less weak after she ate. Denies N/V. Pt does have a pacemaker.

## 2023-12-10 NOTE — H&P (Signed)
 [facility] History and Physical   Patient: Gail Lynch UJW:[id number]   PCP: Glynn Lasso, PA-C DOB: 10-14-1940   DOA: 12/10/2023   DOS: 12/10/2023   DOS: the patient was seen and examined on 12/10/2023  Patient coming from: The patient is coming from ALF/ILF  Chief Complaint: Right arm weakness  HPI: Gail Lynch is a 83 y.o. female with PMH of HTN, DMT2, CKD, aortic stenosis, AV nodal block status post pacemaker, dementia, chronic anemia, as reviewed from EMR, patient fell at the assisted living facility on Friday 4/18 and she was taken to the ER on Saturday 419, trauma workup was done and patient was discharged back to ALF.  Patient was noticed to have weakness in the right arm so she was brought back to the ED on 4/21.  Patient has dementia, AO x 2, poor historian, but she is able to tell me that she feels weakness in the right arm and having some problem with swallowing.  Denies any other complaints.   ED Course: VS afebrile, HR 73, RR 18, BP 184/88, 100% on room air CMP: BG 190, BUN 25, creatinine 1.53, calcium  8.8, albumin  3.1 rest within normal range. CBC Hb 11.8 mild anemia, rest within normal range Viral swab COVID, RSV and flu negative UA negative CT head pending Patient will be transferred to [facility] for MRI brain and MRA head and neck 2D echocardiogram order placed  Review of Systems: as mentioned in the history of present illness.  All other systems reviewed and are negative.  Past Medical History:  Diagnosis Date   Acute lower GI bleeding 05/03/2020   Arthritis    gout in feet   Asthma    hx as a child - no inhaler, no problems   Carotid stenosis 06/18/2012   Colon polyps    Diabetes mellitus    Type 2   Dysplasia of cervix, low grade (CIN 1)    s/p cervical conization   Glaucoma    Glaucoma 12/18/2011   Hard of hearing    hear aids - left   HPV (human papilloma virus) infection    Hyperlipidemia    Hypertension    Intrinsic asthma,  unspecified 12/18/2011   Mild aortic stenosis 03/14/2017   Mycotic toenails 02/19/2013   PONV (postoperative nausea and vomiting)    SVD (spontaneous vaginal delivery)    x 4   Past Surgical History:  Procedure Laterality Date   BREAST BIOPSY  2010   BREAST BIOPSY  1991   COLONOSCOPY     COLONOSCOPY WITH PROPOFOL  N/A 05/06/2020   Procedure: COLONOSCOPY WITH PROPOFOL ;  Surgeon: Nannette Babe, MD;  Location: [facility] ENDOSCOPY;  Service: Endoscopy;  Laterality: N/A;   DILATION AND CURETTAGE OF UTERUS  2000   HYSTEROSCOPY WITH D & C N/A 03/04/2014   Procedure: DILATATION AND CURETTAGE /HYSTEROSCOPY;  Surgeon: Lenord Radon, MD;  Location: [facility] ORS;  Service: Gynecology;  Laterality: N/A;   KNEE SURGERY  2010   patella fracture   MULTIPLE TOOTH EXTRACTIONS     PACEMAKER IMPLANT N/A 03/09/2020   Procedure: PACEMAKER IMPLANT;  Surgeon: Lei Pump, MD;  Location: [facility] INVASIVE CV LAB;  Service: Cardiovascular;  Laterality: N/A;   POLYPECTOMY     POLYPECTOMY  05/06/2020   Procedure: POLYPECTOMY;  Surgeon: Nannette Babe, MD;  Location: [facility] ENDOSCOPY;  Service: Endoscopy;;   Social History:  reports that she quit smoking about 43 years ago. Her smoking use included cigarettes. She has quit using smokeless tobacco.  She reports that she does not drink alcohol and does not use drugs.  Allergies  Allergen Reactions   Aspirin Other (See Comments)    BLEEDING BEHIND EYES   Penicillins Other (See Comments)    Unknown reaction DID THE REACTION INVOLVE: Swelling of the face/tongue/throat, SOB, or low BP? Unknown Sudden or severe rash/hives, skin peeling, or the inside of the mouth or nose? Unknown Did it require medical treatment? Unknown When did it last happen? Unknown If all above answers are "NO", may proceed with cephalosporin use.    Family history reviewed and not pertinent Family History  Problem Relation Age of Onset   Diabetes Brother    Heart disease Brother    Heart disease  Mother    Heart disease Father    Asthma Other    Colon cancer Neg Hx    Rectal cancer Neg Hx    Stomach cancer Neg Hx    Esophageal cancer Neg Hx      Prior to Admission medications   Medication Sig Start Date End Date Taking? Authorizing Provider  acetaminophen  (TYLENOL ) 500 MG tablet Take 500 mg by mouth every 6 (six) hours as needed for mild pain, headache or fever. Patient not taking: Reported on 02/12/2023    [provider]  albuterol  (VENTOLIN  HFA) 108 (90 Base) MCG/ACT inhaler Inhale 2 puffs into the lungs every 4 (four) hours as needed for wheezing or shortness of breath. 05/01/22   Vann, Jessica U, DO  alum & mag hydroxide-simeth (MAALOX/MYLANTA) 200-200-20 MG/5ML suspension Take 30 mLs by mouth every 6 (six) hours as needed for indigestion or heartburn. Patient not taking: Reported on 02/12/2023    [provider]  docusate sodium  (COLACE) 100 MG capsule Take 100 mg by mouth daily. 01/16/23   [provider]  feeding supplement, GLUCERNA SHAKE, (GLUCERNA SHAKE) LIQD Take 237 mLs by mouth 3 (three) times daily between meals. 05/01/22   Vann, Jessica U, DO  glucose blood (ONETOUCH VERIO) test strip 1 each by Other route 2 (two) times daily. And lancets 2/day 08/03/20   Gwyndolyn Lerner, MD  guaiFENesin  (ROBITUSSIN) 100 MG/5ML liquid Take 5 mLs by mouth every 6 (six) hours as needed for cough or to loosen phlegm. Patient not taking: Reported on 02/12/2023    [provider]  hydrALAZINE  (APRESOLINE ) 25 MG tablet Take 1 tablet (25 mg total) by mouth 3 (three) times daily. 12/08/23 01/07/24  LongShereen Dike, MD  hydrochlorothiazide  (MICROZIDE ) 12.5 MG capsule Take 1 capsule (12.5 mg total) by mouth daily. 02/04/21 02/04/22  Roslyn Coombe, MD  hydrocortisone  (ANUSOL -HC) 25 MG suppository Place 1 suppository (25 mg total) rectally 2 (two) times daily. Patient not taking: Reported on 02/12/2023 05/01/22   Vann, Jessica U, DO  Lancets [facility] DELICA PLUS [place])  MISC Test bid 12/28/20   [provider]  LANTUS  SOLOSTAR 100 UNIT/ML Solostar Pen Inject 15 Units into the skin daily. 09/05/21   [provider]  leptospermum manuka honey (MEDIHONEY) PSTE paste Apply 1 Application topically daily. 05/01/22   Vann, Jessica U, DO  loperamide  (IMODIUM ) 2 MG capsule Take 2 mg by mouth as needed for diarrhea or loose stools. Patient not taking: Reported on 02/12/2023    [provider]  magnesium  hydroxide (MILK OF MAGNESIA) 400 MG/5ML suspension Take 30 mLs by mouth at bedtime as needed for mild constipation. Patient not taking: Reported on 02/12/2023    [provider]  OPTIVE 0.5-0.9 % ophthalmic solution Place 1 drop into  both eyes 2 (two) times daily. 01/16/23   [provider]  RESTASIS  0.05 % ophthalmic emulsion Place 1 drop into both eyes 2 (two) times daily. 01/29/23   [provider]  risperiDONE  (RISPERDAL ) 1 MG/ML oral solution Take 1 mL (1 mg total) by mouth 2 (two) times daily. 05/01/22   Vann, Jessica U, DO  rosuvastatin  (CRESTOR ) 40 MG tablet Take 1 tablet (40 mg total) by mouth daily. Patient not taking: Reported on 02/12/2023 05/01/22 05/11/23  Vann, Jessica U, DO  STIMULANT LAXATIVE 8.6-50 MG tablet Take 1 tablet by mouth daily. Patient not taking: Reported on 02/12/2023 01/16/23   [provider]  zinc  sulfate 220 (50 Zn) MG capsule Take 1 capsule (220 mg total) by mouth daily. Patient not taking: Reported on 02/12/2023 05/01/22   Enrigue Harvard, DO    Physical Exam: Vitals:   12/10/23 1038 12/10/23 1048 12/10/23 1115  BP: (!) 184/88  (!) 177/70  Pulse: 75  77  Resp: 18  11  Temp: 97.6 F (36.4 C)    TempSrc: Oral    SpO2: 100%  100%  Weight:  80 kg   Height:  5\' 3"  (1.6 m)     General: alert and oriented to place and person. Appear in no distress, affect appropriate Eyes: PERRLA, Conjunctiva normal ENT: Oral Mucosa Clear, moist  Neck: no JVD, no Abnormal Mass Or  lumps Cardiovascular: S1 and S2 Present, no Murmur, irregular rhythm peripheral pulses symmetrical Respiratory: good respiratory effort, Bilateral Air entry equal and Decreased, no signs of accessory muscle use, Clear to Auscultation, no Crackles, no wheezes Abdomen: Bowel Sound present, Soft and no tenderness Skin: no rashes  Extremities: no Pedal edema, no calf tenderness Neurologic: AAOX 2, not aware of M/y, right arm power 4/5, no any other focal deficits Gait not checked due to patient safety concerns  Data Reviewed: I have personally reviewed and interpreted labs, imaging as discussed below.  CBC: Recent Labs  Lab 12/08/23 1017 12/10/23 1044  WBC 6.8 6.9  NEUTROABS 4.5 4.7  HGB 11.9* 11.8*  HCT 37.8 38.4  MCV 87.5 87.9  PLT 272 276   Basic Metabolic Panel: Recent Labs  Lab 12/08/23 1017 12/10/23 1044  NA 143 139  K 4.1 4.1  CL 111 105  CO2 23 23  GLUCOSE 99 190*  BUN 19 25*  CREATININE 1.31* 1.53*  CALCIUM  9.0 8.8*   GFR: Estimated Creatinine Clearance: 27.4 mL/min (A) (by C-G formula based on SCr of 1.53 mg/dL (H)). Liver Function Tests: Recent Labs  Lab 12/10/23 1044  AST 16  ALT 8  ALKPHOS 67  BILITOT 0.6  PROT 6.8  ALBUMIN  3.1*   No results for input(s): "LIPASE", "AMYLASE" in the last 168 hours. No results for input(s): "AMMONIA" in the last 168 hours. Coagulation Profile: Recent Labs  Lab 12/10/23 1259  INR 1.0   Cardiac Enzymes: No results for input(s): "CKTOTAL", "CKMB", "CKMBINDEX", "TROPONINI" in the last 168 hours. BNP (last 3 results) No results for input(s): "PROBNP" in the last 8760 hours. HbA1C: No results for input(s): "HGBA1C" in the last 72 hours. CBG: Recent Labs  Lab 12/08/23 0932  GLUCAP 88   Lipid Profile: No results for input(s): "CHOL", "HDL", "LDLCALC", "TRIG", "CHOLHDL", "LDLDIRECT" in the last 72 hours. Thyroid  Function Tests: No results for input(s): "TSH", "T4TOTAL", "FREET4", "T3FREE", "THYROIDAB" in the  last 72 hours. Anemia Panel: No results for input(s): "VITAMINB12", "FOLATE", "FERRITIN", "TIBC", "IRON", "RETICCTPCT" in the last 72 hours. Urine analysis:  Component Value Date/Time   COLORURINE YELLOW 12/10/2023 1113   APPEARANCEUR CLEAR 12/10/2023 1113   LABSPEC 1.009 12/10/2023 1113   PHURINE 6.0 12/10/2023 1113   GLUCOSEU NEGATIVE 12/10/2023 1113   GLUCOSEU 100 (A) 07/29/2020 1041   HGBUR NEGATIVE 12/10/2023 1113   BILIRUBINUR NEGATIVE 12/10/2023 1113   KETONESUR NEGATIVE 12/10/2023 1113   PROTEINUR NEGATIVE 12/10/2023 1113   UROBILINOGEN 0.2 07/29/2020 1041   NITRITE NEGATIVE 12/10/2023 1113   LEUKOCYTESUR NEGATIVE 12/10/2023 1113    Radiological Exams on Admission: No results found. EKG: Independently reviewed. Atrial sensed, ventricular rhythm Echocardiogram: Pending  I reviewed all nursing notes, pharmacy notes, vitals, pertinent old records.  Assessment/Plan Principal Problem:   Stroke-like symptom   # Stroke-like symptom Patient had a fall on Friday 4/18, brought into the ED on 4/19, trauma workup was negative.  ALF noticed right arm weakness the patient was brought back to the ED EKG: Atrial-sensed ventricular-paced rhythm Continue to monitor on telemetry Permissive hypertension Resumed Crestor  20 mg, follow lipid profile and adjust dose accordingly Hold off aspirin for now, listed allergy (bleeding behind eyes) discussed with patient's daughter who does not know the exact cause why she is not on aspirin Continue neurocheck as per protocol Check lipid profile, TSH and hemoglobin A1c Follow PT/OT and SLP eval Follow 2D echocardiogram Follow CT head Patient will be transferred to [facility] for MRI brain and MRA head and neck as per neurology Follow neuro consult,d/w Dr. Doretta Gant   # IDDM T2 Held home regimen for now Started NovoLog  sliding scale Monitor CBG and titrate insulin  accordingly Continue diabetic diet  # Hypertension, AV block status post  pacemaker, aortic stenosis Held home regimen for now to allow permissive hypertension due to stroke Use IV hydralazine  as needed Resume home medication when cleared by neurology  # AKI on CKD stage IIIa, base line Cr 1.03 eGFR 54   Creatinine 1.53 elevated Continue gentle hydration Monitor renal functions and urine output   # Dementia, AAO x 2, Hard of hearing and slow to process and reply Continue supportive care   Nutrition: Carb modified diet DVT Prophylaxis: Subcutaneous Lovenox   Advance goals of care discussion: Full code   Consults: I personally Discussed with Dr. Doretta Gant   Family Communication: family was notpresent at bedside, at the time of interview.  Opportunity was given to ask question and all questions were answered satisfactorily.  D/w pts Dtr. Corine Dice on phone: [phone]   Disposition: Admitted as observation, medical telemetry unit. Likely to be discharged ALF, in 1-2 days when cleared by Neuro.  I have discussed plan of care as described above with RN and patient/family.  Severity of Illness: The appropriate patient status for this patient is OBSERVATION. Observation status is judged to be reasonable and necessary in order to provide the required intensity of service to ensure the patient's safety. The patient's presenting symptoms, physical exam findings, and initial radiographic and laboratory data in the context of their medical condition is felt to place them at decreased risk for further clinical deterioration. Furthermore, it is anticipated that the patient will be medically stable for discharge from the hospital within 2 midnights of admission.    Author: Althia Atlas, MD [facility] 12/10/2023 2:29 PM   To reach On-call, see care teams to locate the attending and reach out to them via [email or web address]. If 7PM-7AM, please contact night-coverage If you still have difficulty reaching the attending provider, please page the [facility] (Director on  Call) for [facility] on  amion for assistance.

## 2023-12-10 NOTE — ED Notes (Signed)
 Food tray given to pt

## 2023-12-11 ENCOUNTER — Observation Stay (HOSPITAL_BASED_OUTPATIENT_CLINIC_OR_DEPARTMENT_OTHER)

## 2023-12-11 ENCOUNTER — Observation Stay (HOSPITAL_COMMUNITY)

## 2023-12-11 DIAGNOSIS — R4701 Aphasia: Secondary | ICD-10-CM | POA: Diagnosis not present

## 2023-12-11 DIAGNOSIS — I10 Essential (primary) hypertension: Secondary | ICD-10-CM | POA: Diagnosis not present

## 2023-12-11 DIAGNOSIS — Z950 Presence of cardiac pacemaker: Secondary | ICD-10-CM | POA: Diagnosis not present

## 2023-12-11 DIAGNOSIS — I6381 Other cerebral infarction due to occlusion or stenosis of small artery: Secondary | ICD-10-CM | POA: Diagnosis not present

## 2023-12-11 DIAGNOSIS — I739 Peripheral vascular disease, unspecified: Secondary | ICD-10-CM

## 2023-12-11 DIAGNOSIS — R2990 Unspecified symptoms and signs involving the nervous system: Secondary | ICD-10-CM | POA: Diagnosis not present

## 2023-12-11 DIAGNOSIS — R1310 Dysphagia, unspecified: Secondary | ICD-10-CM | POA: Diagnosis not present

## 2023-12-11 DIAGNOSIS — Z1152 Encounter for screening for COVID-19: Secondary | ICD-10-CM | POA: Diagnosis not present

## 2023-12-11 DIAGNOSIS — Z794 Long term (current) use of insulin: Secondary | ICD-10-CM | POA: Diagnosis not present

## 2023-12-11 DIAGNOSIS — R531 Weakness: Secondary | ICD-10-CM | POA: Diagnosis present

## 2023-12-11 DIAGNOSIS — N179 Acute kidney failure, unspecified: Secondary | ICD-10-CM | POA: Diagnosis not present

## 2023-12-11 DIAGNOSIS — J45909 Unspecified asthma, uncomplicated: Secondary | ICD-10-CM | POA: Diagnosis not present

## 2023-12-11 DIAGNOSIS — E1122 Type 2 diabetes mellitus with diabetic chronic kidney disease: Secondary | ICD-10-CM | POA: Diagnosis not present

## 2023-12-11 DIAGNOSIS — N1831 Chronic kidney disease, stage 3a: Secondary | ICD-10-CM | POA: Diagnosis not present

## 2023-12-11 DIAGNOSIS — Z87891 Personal history of nicotine dependence: Secondary | ICD-10-CM | POA: Diagnosis not present

## 2023-12-11 DIAGNOSIS — Z79899 Other long term (current) drug therapy: Secondary | ICD-10-CM | POA: Diagnosis not present

## 2023-12-11 DIAGNOSIS — Z6831 Body mass index (BMI) 31.0-31.9, adult: Secondary | ICD-10-CM | POA: Diagnosis not present

## 2023-12-11 DIAGNOSIS — I129 Hypertensive chronic kidney disease with stage 1 through stage 4 chronic kidney disease, or unspecified chronic kidney disease: Secondary | ICD-10-CM | POA: Diagnosis not present

## 2023-12-11 DIAGNOSIS — R42 Dizziness and giddiness: Secondary | ICD-10-CM | POA: Diagnosis present

## 2023-12-11 DIAGNOSIS — E66811 Obesity, class 1: Secondary | ICD-10-CM | POA: Diagnosis not present

## 2023-12-11 DIAGNOSIS — R29818 Other symptoms and signs involving the nervous system: Secondary | ICD-10-CM | POA: Diagnosis not present

## 2023-12-11 DIAGNOSIS — F0390 Unspecified dementia without behavioral disturbance: Secondary | ICD-10-CM | POA: Diagnosis not present

## 2023-12-11 DIAGNOSIS — E119 Type 2 diabetes mellitus without complications: Secondary | ICD-10-CM

## 2023-12-11 LAB — ECHOCARDIOGRAM COMPLETE
AR max vel: 1.18 cm2
AV Area VTI: 1.13 cm2
AV Area mean vel: 1.15 cm2
AV Mean grad: 5.5 mmHg
AV Peak grad: 10.7 mmHg
Ao pk vel: 1.64 m/s
Area-P 1/2: 4.1 cm2
Height: 63 in
S' Lateral: 3.1 cm
Weight: 2821.89 [oz_av]

## 2023-12-11 LAB — BASIC METABOLIC PANEL WITH GFR
Anion gap: 15 (ref 5–15)
BUN: 18 mg/dL (ref 8–23)
CO2: 21 mmol/L — ABNORMAL LOW (ref 22–32)
Calcium: 8.1 mg/dL — ABNORMAL LOW (ref 8.9–10.3)
Chloride: 108 mmol/L (ref 98–111)
Creatinine, Ser: 1.22 mg/dL — ABNORMAL HIGH (ref 0.44–1.00)
GFR, Estimated: 44 mL/min — ABNORMAL LOW (ref >60)
Glucose, Bld: 163 mg/dL — ABNORMAL HIGH (ref 70–99)
Potassium: 3.9 mmol/L (ref 3.5–5.1)
Sodium: 144 mmol/L (ref 135–145)

## 2023-12-11 LAB — GLUCOSE, CAPILLARY
Glucose-Capillary: 181 mg/dL — ABNORMAL HIGH (ref 70–99)
Glucose-Capillary: 165 mg/dL — ABNORMAL HIGH (ref 70–99)

## 2023-12-11 LAB — HEMOGLOBIN A1C
Hgb A1c MFr Bld: 8.8 % — ABNORMAL HIGH (ref 4.8–5.6)
Mean Plasma Glucose: 205.86 mg/dL

## 2023-12-11 LAB — CBG MONITORING, ED
Glucose-Capillary: 168 mg/dL — ABNORMAL HIGH (ref 70–99)
Glucose-Capillary: 197 mg/dL — ABNORMAL HIGH (ref 70–99)
Glucose-Capillary: 204 mg/dL — ABNORMAL HIGH (ref 70–99)

## 2023-12-11 MED ORDER — ROSUVASTATIN CALCIUM 20 MG PO TABS
40.0000 mg | ORAL_TABLET | Freq: Every day | ORAL | Status: DC
  Administered 2023-12-12: 40 mg via ORAL
  Filled 2023-12-11: qty 2

## 2023-12-11 MED ORDER — ARTIFICIAL TEARS OPHTHALMIC OINT: 1.0000 | TOPICAL_OINTMENT | Freq: Two times a day (BID) | OPHTHALMIC | Status: DC | PRN

## 2023-12-11 MED ORDER — RISPERIDONE 1 MG/ML PO SOLN
1.0000 mg | Freq: Two times a day (BID) | ORAL | Status: DC
  Administered 2023-12-11 – 2023-12-12 (×3): 1 mg via ORAL
  Filled 2023-12-11 (×5): qty 1

## 2023-12-11 MED ORDER — CYCLOSPORINE 0.05 % OP EMUL
1.0000 [drp] | Freq: Two times a day (BID) | OPHTHALMIC | Status: DC
  Administered 2023-12-11 – 2023-12-13 (×5): 1 [drp] via OPHTHALMIC
  Filled 2023-12-11 (×6): qty 30

## 2023-12-11 MED ORDER — STROKE: EARLY STAGES OF RECOVERY BOOK
Freq: Once | Status: AC
  Filled 2023-12-11: qty 1

## 2023-12-11 MED ORDER — PERFLUTREN LIPID MICROSPHERE
1.0000 mL | INTRAVENOUS | Status: AC | PRN
  Administered 2023-12-11: 2 mL via INTRAVENOUS

## 2023-12-11 NOTE — Hospital Course (Addendum)
 50 yof w/ PMH of dementia AAO x 2, HTN,DMT2,CKD3a b/l creat 1.0, aortic stenosis, AV nodal block status post pacemaker, dementia, chronic anemia, from ALF who had a fall on Friday 12/07/23, then seen at ER on Saturday 4/19, trauma workup was done and patient was discharged back to ALFand  patient was noticed to have weakness in the right arm so she was brought back to the ED on 12/10/23 .patient is a poor historian, but she was able to tell she feels weakness in the right arm and having some problem with swallowing. Neurology was consulted admitted for further stroke workup  Subjective: Seen examined Daughter at bedside Trying to eat but coughs while eating Weakness on Rt arm/leg. Overnight BP 150s-160s altho in beginning high 180s, at times 100s Labs pending> resulted later with creatinine improved to 1.2  Assessment/Plan:  Acute stroke Fall at ALF on 4/18: CT brain 4/21-"Asymmetric hypodensity within the posterior limb of left internal capsule, new from CT 04/19". Neurology has been consulted plan is for MRI MRI brain-but due to pacemaker beign sent to [facility]. LDL not at goal 155- PTA on Crestor   40 mg resumed  EKG ventricular paced rhythm Continue to monitor on telemetry, allow permissive hypertension ASA listed allergy (bleeding behind eyes) admitting discussed with patient's daughter who does not know the exact cause why she is not on aspirin.A1c 8.8 f/u TTE. Messaged Dr. Doretta Gant await further recommendation    IDDM T2 Control A1c 8.8 indicating poor outpatient control holding p.o. meds continue SSI Recent Labs  Lab 12/08/23 0932 12/10/23 1135 12/10/23 1702 12/10/23 1748 12/11/23 0015 12/11/23 0733  GLUCAP 88  --  137* 135* 168* 197*  HGBA1C  --  8.8*  --   --   --   --      Hypertension AV block s/p PPM AS: Allow permissive hypertension due to acute stroke continue as needed hydralazine  Monitor in telemetry   AKI on CKD stage IIIa: B.l creat ~1.03 eGFR 54. On admit 1.5.   Avoid nephrotoxic continue gentle iv nss and monitor bmp Recent Labs    12/08/23 1017 12/10/23 1044  BUN 19 25*  CREATININE 1.31* 1.53*  CO2 23 23  K 4.1 4.1    Dementia: She is AAO x 2, baseline heard to hear, slow to process. continue delirium precautions supportive care

## 2023-12-11 NOTE — ED Notes (Signed)
 Speech therapist at bedside. Doing her oral evaluation.

## 2023-12-11 NOTE — Evaluation (Signed)
 Physical Therapy Evaluation Patient Details Name: Gail Lynch MRN: [id number] DOB: [birth year] Today's Date: 12/11/2023  History of Present Illness  Patient is a 83 year old female who presented with weakness on R side on 4/21. Patient was recently in ED from ALF after fall. Patient was admitted with stroke like symptoms, AKI. PMH: dementia, CKD III, HTN, av block s/p pacemaker, aortic stenosis, and IDDM II.  Clinical Impression  Pt admitted with above diagnosis.  Pt currently with functional limitations due to the deficits listed below (see PT Problem List). Pt will benefit from acute skilled PT to increase their independence and safety with mobility to allow discharge.     The patient  presents with Right LE weakness. Patient   required mod assistance for mobility, patient did  stand at [facility] and able to step to transfer to a chair and back to a stretcher with Mod. Assistance. Patient noted to lean to the right w while in sitting and standing.  Patient's family present and reports that patient is ambulatory and performs About 90% of her ADL's PTA, resides in ALF. Patient will benefit from continued inpatient follow up therapy, <3 hours/day.       If plan is discharge home, recommend the following: Two people to help with walking and/or transfers;A lot of help with bathing/dressing/bathroom;Assist for transportation   Can travel by private vehicle   No    Equipment Recommendations None recommended by PT  Recommendations for Other Services       Functional Status Assessment Patient has had a recent decline in their functional status and demonstrates the ability to make significant improvements in function in a reasonable and predictable amount of time.     Precautions / Restrictions Precautions Precautions: Fall Recall of Precautions/Restrictions: Impaired Precaution/Restrictions Comments: hears best on L ear Restrictions Weight Bearing Restrictions Per Provider Order: No       Mobility  Bed Mobility   Bed Mobility: Supine to Sit, Sit to Supine     Supine to sit: Mod assist, +2 for physical assistance, +2 for safety/equipment Sit to supine: +2 for physical assistance, +2 for safety/equipment, Mod assist   General bed mobility comments: with cues for rolling and advancement to EOB.    Transfers Overall transfer level: Needs assistance Equipment used: Rolling walker (2 wheels) Transfers: Sit to/from Stand, Bed to chair/wheelchair/BSC Sit to Stand: Mod assist, +2 safety/equipment   Step pivot transfers: Mod assist       General transfer comment: noted listing to the right when standing, more heavy leaning when trnafers from chair back to stretcher    Ambulation/Gait                  Stairs            Wheelchair Mobility     Tilt Bed    Modified Rankin (Stroke Patients Only)       Balance Overall balance assessment: Needs assistance, History of Falls Sitting-balance support: Bilateral upper extremity supported, Feet supported Sitting balance-Leahy Scale: Poor   Postural control: Right lateral lean Standing balance support: Reliant on assistive device for balance, Bilateral upper extremity supported Standing balance-Leahy Scale: Poor Standing balance comment: right lean                             Pertinent Vitals/Pain Pain Assessment Pain Assessment: No/denies pain    Home Living Family/patient expects to be discharged to:: Assisted living  Type of Home: Other(Comment) (ALF)           Home Equipment: Rolling Walker (2 wheels);Cane - single point Additional Comments: patient has resident at [facility] in memory care unit per daughter report.    Prior Function               Mobility Comments: ambulatory with a Rw ADLs Comments: daughter indicated patient was able to complete 90% of ADLs herself with some use of RW.     Extremity/Trunk Assessment   Upper Extremity  Assessment Upper Extremity Assessment: Defer to OT evaluation RUE Deficits / Details: ROM WFL strength was 3+/5 LUE Deficits / Details: WFL ROM strength was 4-/5    Lower Extremity Assessment Lower Extremity Assessment: RLE deficits/detail RLE Deficits / Details: noted to list to the right decreased WB and stepping,    Cervical / Trunk Assessment Cervical / Trunk Assessment: Kyphotic  Communication   Communication Communication: Impaired Factors Affecting Communication: Hearing impaired    Cognition Arousal: Alert Behavior During Therapy: Flat affect   PT - Cognitive impairments: History of cognitive impairments                       PT - Cognition Comments: difficulty  to follow with [facility], does follow simple  directions when she hears         Cueing       General Comments      Exercises     Assessment/Plan    PT Assessment Patient needs continued PT services  PT Problem List Decreased strength;Decreased mobility;Decreased safety awareness;Decreased knowledge of precautions;Decreased activity tolerance;Decreased balance;Decreased cognition       PT Treatment Interventions DME instruction;Therapeutic exercise;Gait training;Balance training;Functional mobility training;Therapeutic activities;Neuromuscular re-education    PT Goals (Current goals can be found in the Care Plan section)  Acute Rehab PT Goals Patient Stated Goal: to walk PT Goal Formulation: With patient/family Time For Goal Achievement: 12/25/23 Potential to Achieve Goals: Good    Frequency Min 2X/week     Co-evaluation PT/OT/SLP Co-Evaluation/Treatment: Yes Reason for Co-Treatment: For patient/therapist safety PT goals addressed during session: Mobility/safety with mobility OT goals addressed during session: ADL's and self-care       AM-PAC PT "6 Clicks" Mobility  Outcome Measure Help needed turning from your back to your side while in a flat bed without using bedrails?: A  Lot Help needed moving from lying on your back to sitting on the side of a flat bed without using bedrails?: A Lot Help needed moving to and from a bed to a chair (including a wheelchair)?: A Lot Help needed standing up from a chair using your arms (e.g., wheelchair or bedside chair)?: A Lot Help needed to walk in hospital room?: Total Help needed climbing 3-5 steps with a railing? : Total 6 Click Score: 10    End of Session Equipment Utilized During Treatment: Gait belt Activity Tolerance: Patient tolerated treatment well Patient left: in bed;with call bell/phone within reach;with family/visitor present Nurse Communication: Mobility status PT Visit Diagnosis: Unsteadiness on feet (R26.81)    Time: 4782-9562 PT Time Calculation (min) (ACUTE ONLY): 26 min   Charges:   PT Evaluation $PT Eval Low Complexity: 1 Low   PT General Charges $$ ACUTE PT VISIT: 1 Visit         Abelina Hoes PT Acute Rehabilitation Services Office [phone] Weekend pager-[phone]   Dareen Ebbing 12/11/2023, 11:34 AM

## 2023-12-11 NOTE — ED Notes (Signed)
 Report given to Dana, RN [facility]). Called [facility] and spoke to [place] for transport.

## 2023-12-11 NOTE — Progress Notes (Signed)
 Due to this pt having a pacemaker the pt will need a nurse to monitor them during the scan this pt is scheduled for 4/23 at 9 am.

## 2023-12-11 NOTE — Progress Notes (Signed)
 Per Dr Lydia Sams patient may go to mri unaccompanied by a nurse.

## 2023-12-11 NOTE — Progress Notes (Signed)
 2D echo attempted, patient in route to [facility].

## 2023-12-11 NOTE — ED Notes (Addendum)
 Patient eating breakfast, on high back rest. Took medications without signs of aspiration

## 2023-12-11 NOTE — Progress Notes (Signed)
Patient arrives to 5W04 at this time

## 2023-12-11 NOTE — ED Notes (Signed)
 Talked to a staff from the facility and updated regarding treatment plan.

## 2023-12-11 NOTE — Progress Notes (Signed)
 PROGRESS NOTE Gail Lynch  ZOX:[id number] DOB: 11/21/1940 DOA: 12/10/2023 PCP: Glynn Lasso, PA-C  Brief Narrative/Hospital Course: 28 yof w/ PMH of dementia AAO x 2, HTN,DMT2,CKD3a b/l creat 1.0, aortic stenosis, AV nodal block status post pacemaker, dementia, chronic anemia, from ALF who had a fall on Friday 12/07/23, then seen at ER on Saturday 4/19, trauma workup was done and patient was discharged back to [facility]  patient was noticed to have weakness in the right arm so she was brought back to the ED on 12/10/23 .patient is a poor historian, but she was able to tell she feels weakness in the right arm and having some problem with swallowing. Neurology was consulted admitted for further stroke workup  Subjective: Seen examined Daughter at bedside Trying to eat but coughs while eating Weakness on Rt arm/leg. Overnight BP 150s-160s altho in beginning high 180s, at times 100s Labs pending> resulted later with creatinine improved to 1.2  Assessment/Plan:  Acute stroke Fall at ALF on 4/18: CT brain 4/21-"Asymmetric hypodensity within the posterior limb of left internal capsule, new from CT 04/19". Neurology has been consulted plan is for MRI MRI brain-but due to pacemaker beign [facility]. LDL not at goal 155- PTA on Crestor   40 mg resumed  EKG ventricular paced rhythm Continue to monitor on telemetry, allow permissive hypertension ASA listed allergy (bleeding behind eyes) admitting discussed with patient's daughter who does not know the exact cause why she is not on aspirin.A1c 8.8 f/u TTE. Messaged Dr. Doretta Gant await further recommendation    IDDM T2 Control A1c 8.8 indicating poor outpatient control holding p.o. meds continue SSI Recent Labs  Lab 12/08/23 0932 12/10/23 1135 12/10/23 1702 12/10/23 1748 12/11/23 0015 12/11/23 0733  GLUCAP 88  --  137* 135* 168* 197*  HGBA1C  --  8.8*  --   --   --   --      Hypertension AV block s/p PPM AS: Allow permissive hypertension  due to acute stroke continue as needed hydralazine  Monitor in telemetry   AKI on CKD stage IIIa: B.l creat ~1.03 eGFR 54. On admit 1.5.  Avoid nephrotoxic continue gentle iv nss and monitor bmp Recent Labs    12/08/23 1017 12/10/23 1044  BUN 19 25*  CREATININE 1.31* 1.53*  CO2 23 23  K 4.1 4.1    Dementia: She is AAO x 2, baseline heard to hear, slow to process. continue delirium precautions supportive care    Class I Obesity w/ Body mass index is 31.24 kg/m.: Will benefit with PCP follow-up, weight loss,healthy lifestyle and outpatient sleep eval if not done.   DVT prophylaxis: enoxaparin  (LOVENOX ) injection 30 mg Start: 12/10/23 1600 Code Status:   Code Status: Full Code Family Communication: plan of care discussed with patient/daughter at bedside. Patient status is: Remains hospitalized because of severity of illness Level of care: Telemetry Medical   Dispo: The patient is from: alf            Anticipated disposition: TBD  Objective: Vitals last 24 hrs: Vitals:   12/11/23 1030 12/11/23 1100 12/11/23 1130 12/11/23 1200  BP: (!) 128/53 126/62 (!) 158/60 (!) 162/83  Pulse: 90 87 87 90  Resp: 16 18 18 14   Temp:   97.7 F (36.5 C)   TempSrc:   Oral   SpO2: 99% 99% 100% 100%  Weight:      Height:       Weight change:   Physical Examination: General exam: alert awake, older than stated age  HEENT:Oral mucosa moist, Ear/Nose WNL grossly Respiratory system: Bilaterally diminished BS, no use of accessory muscle Cardiovascular system: S1 & S2 +. Gastrointestinal system: Abdomen soft, NT,ND,BS+ Nervous System: Alert, awake,following commands. Weak rt arm Extremities: LE edema neg, moving arms- but weak, warm legs Skin: No rashes,warm. MSK: Normal muscle bulk/tone.   Medications reviewed:  Scheduled Meds:  [START ON 12/12/2023]  stroke: early stages of recovery book   Does not apply Once   bisacodyl   10 mg Oral QHS   enoxaparin  (LOVENOX ) injection  30 mg  Subcutaneous Q24H   feeding supplement (GLUCERNA SHAKE)  237 mL Oral TID BM   insulin  aspart  0-15 Units Subcutaneous TID WC   polyethylene glycol  17 g Oral BID   rosuvastatin   40 mg Oral Daily   Continuous Infusions:    Diet Order             Diet Carb Modified Fluid consistency: Thin; Room service appropriate? Yes  Diet effective now                   Intake/Output Summary (Last 24 hours) at 12/11/2023 1436 Last data filed at 12/11/2023 0805 Gross per 24 hour  Intake 1010.55 ml  Output --  Net 1010.55 ml   Net IO Since Admission: 1,010.55 mL [12/11/23 1436]  Wt Readings from Last 3 Encounters:  12/10/23 80 kg  12/08/23 80 kg  02/12/23 78.7 kg    Unresulted Labs (From admission, onward)     Start     Ordered   12/12/23 0500  Basic metabolic panel with GFR  Daily,   R      12/11/23 0746   12/12/23 0500  CBC  Daily,   R      12/11/23 0746          Data Reviewed: I have personally reviewed following labs and imaging studies ( see epic result tab) CBC: Recent Labs  Lab 12/08/23 1017 12/10/23 1044  WBC 6.8 6.9  NEUTROABS 4.5 4.7  HGB 11.9* 11.8*  HCT 37.8 38.4  MCV 87.5 87.9  PLT 272 276   CMP: Recent Labs  Lab 12/08/23 1017 12/10/23 1044 12/11/23 1019  NA 143 139 144  K 4.1 4.1 3.9  CL 111 105 108  CO2 23 23 21*  GLUCOSE 99 190* 163*  BUN 19 25* 18  CREATININE 1.31* 1.53* 1.22*  CALCIUM  9.0 8.8* 8.1*   GFR: Estimated Creatinine Clearance: 34.4 mL/min (A) (by C-G formula based on SCr of 1.22 mg/dL (H)). Recent Labs  Lab 12/10/23 1044  AST 16  ALT 8  ALKPHOS 67  BILITOT 0.6  PROT 6.8  ALBUMIN  3.1*   No results for input(s): "LIPASE", "AMYLASE" in the last 168 hours. No results for input(s): "AMMONIA" in the last 168 hours. Coagulation Profile:  Recent Labs  Lab 12/10/23 1259  INR 1.0   No results for input(s): "PROBNP" in the last 168 hours.  Recent Labs    12/10/23 1135  HGBA1C 8.8*   Recent Labs  Lab 12/10/23 1702  12/10/23 1748 12/11/23 0015 12/11/23 0733 12/11/23 1207  GLUCAP 137* 135* 168* 197* 204*   Recent Labs    12/10/23 2235  CHOL 233*  HDL 52  LDLCALC 155*  TRIG 129  CHOLHDL 4.5   Recent Labs    12/10/23 1137  TSH 2.153   Sepsis Labs:No results for input(s): "PROCALCITON", "LATICACIDVEN" in the last 168 hours. Recent Results (from the past 240 hours)  Urine Culture  Status: Abnormal   Collection Time: 12/08/23 12:20 PM   Specimen: Urine, Random  Result Value Ref Range Status   Specimen Description URINE, RANDOM  Final   Special Requests   Final    NONE Reflexed from [id number] Performed at [facility], [address], [place], [place] [address]    Culture MULTIPLE SPECIES PRESENT, SUGGEST RECOLLECTION (A)  Final   Report Status 12/10/2023 FINAL  Final  Resp panel by RT-PCR (RSV, Flu A&B, Covid) Anterior Nasal Swab     Status: None   Collection Time: 12/10/23 11:03 AM   Specimen: Anterior Nasal Swab  Result Value Ref Range Status   SARS Coronavirus 2 by RT PCR NEGATIVE NEGATIVE Final    Comment: (NOTE) SARS-CoV-2 target nucleic acids are NOT DETECTED.  The SARS-CoV-2 RNA is generally detectable in upper respiratory specimens during the acute phase of infection. The lowest concentration of SARS-CoV-2 viral copies this assay can detect is 138 copies/mL. A negative result does not preclude SARS-Cov-2 infection and should not be used as the sole basis for treatment or other patient management decisions. A negative result may occur with  improper specimen collection/handling, submission of specimen other than nasopharyngeal swab, presence of viral mutation(s) within the areas targeted by this assay, and inadequate number of viral copies(<138 copies/mL). A negative result must be combined with clinical observations, patient history, and epidemiological information. The expected result is Negative.  Fact Sheet for Patients:   BloggerCourse.com  Fact Sheet for Healthcare Providers:  SeriousBroker.it  This test is no t yet approved or cleared by the [place]  FDA and  has been authorized for detection and/or diagnosis of SARS-CoV-2 by FDA under an Emergency Use Authorization (EUA). This EUA will remain  in effect (meaning this test can be used) for the duration of the COVID-19 declaration under Section 564(b)(1) of the Act, 21 U.S.C.section 360bbb-3(b)(1), unless the authorization is terminated  or revoked sooner.       Influenza A by PCR NEGATIVE NEGATIVE Final   Influenza B by PCR NEGATIVE NEGATIVE Final    Comment: (NOTE) The Xpert Xpress SARS-CoV-2/FLU/RSV plus assay is intended as an aid in the diagnosis of influenza from Nasopharyngeal swab specimens and should not be used as a sole basis for treatment. Nasal washings and aspirates are unacceptable for Xpert Xpress SARS-CoV-2/FLU/RSV testing.  Fact Sheet for Patients: BloggerCourse.com  Fact Sheet for Healthcare Providers: SeriousBroker.it  This test is not yet approved or cleared by the [place]  FDA and has been authorized for detection and/or diagnosis of SARS-CoV-2 by FDA under an Emergency Use Authorization (EUA). This EUA will remain in effect (meaning this test can be used) for the duration of the COVID-19 declaration under Section 564(b)(1) of the Act, 21 U.S.C. section 360bbb-3(b)(1), unless the authorization is terminated or revoked.     Resp Syncytial Virus by PCR NEGATIVE NEGATIVE Final    Comment: (NOTE) Fact Sheet for Patients: BloggerCourse.com  Fact Sheet for Healthcare Providers: SeriousBroker.it  This test is not yet approved or cleared by the [place]  FDA and has been authorized for detection and/or diagnosis of SARS-CoV-2 by FDA under an Emergency Use  Authorization (EUA). This EUA will remain in effect (meaning this test can be used) for the duration of the COVID-19 declaration under Section 564(b)(1) of the Act, 21 U.S.C. section 360bbb-3(b)(1), unless the authorization is terminated or revoked.  Performed at [facility], [address], [place], [place] [address]     Antimicrobials/Microbiology: Anti-infectives (  From admission, onward)    None         Component Value Date/Time   SDES URINE, RANDOM 12/08/2023 1220   SPECREQUEST  12/08/2023 1220    NONE Reflexed from [id number] Performed at [facility], [address], [place], [place] [address]    CULT MULTIPLE SPECIES PRESENT, SUGGEST RECOLLECTION (A) 12/08/2023 1220   REPTSTATUS 12/10/2023 FINAL 12/08/2023 1220    Radiology Studies: CT Head Wo Contrast Result Date: 12/10/2023 CLINICAL DATA:  Provided history: Neuro deficit, acute, stroke suspected. Additional history provided: Recent fall, weakness, right arm weakness. EXAM: CT HEAD WITHOUT CONTRAST TECHNIQUE: Contiguous axial images were obtained from the base of the skull through the vertex without intravenous contrast. RADIATION DOSE REDUCTION: This exam was performed according to the departmental dose-optimization program which includes automated exposure control, adjustment of the mA and/or kV according to patient size and/or use of iterative reconstruction technique. COMPARISON:  Head CT 12/08/2023. FINDINGS: Brain: Generalized cerebral atrophy. Asymmetric hypodensity within the posterior limb of left internal capsule, new from the prior head CT of 12/08/2023 and suspicious for an acute infarct (for instance as seen on series 2, images 12 and 13) (series 5, image 41). Patchy and ill-defined hypoattenuation within the cerebral white matter, nonspecific but compatible with moderate chronic small vessel ischemic disease. Small chronic lacunar infarct again demonstrated within the right thalamus. Partially  empty sella turcica. There is no acute intracranial hemorrhage. No extra-axial fluid collection. No evidence of an intracranial mass. No midline shift. Vascular: No hyperdense vessel. Atherosclerotic calcifications. Skull: No calvarial fracture or aggressive osseous lesion. Sinuses/Orbits: No mass or acute finding within the imaged orbits. No significant paranasal sinus disease at the imaged levels. IMPRESSION: 1. Asymmetric hypodensity within the posterior limb of left internal capsule, new from the prior head CT of 12/08/2023 and suspicious for an acute infarct. Consider a brain MRI for further evaluation. 2. Background moderate cerebral white matter chronic small vessel ischemic disease. 3. Unchanged right thalamic lacunar infarct. 4. Cerebral atrophy. Electronically Signed   By: Bascom Lily D.O.   On: 12/10/2023 18:32    LOS: 0 days   Total time spent in review of labs and imaging, patient evaluation, formulation of plan, documentation and communication with patient/family: 50 minutes  Lesa Rape, MD [facility] 12/11/2023, 2:36 PM

## 2023-12-11 NOTE — ED Notes (Signed)
 Patient's diaper appears dry, chucks placed under the patient. Warm blankets provided.

## 2023-12-11 NOTE — ED Notes (Signed)
 [facility] at bedside
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
## 2023-12-11 NOTE — ED Notes (Signed)
 Pillow, yellow arm band and non-skids socks placed on patient.

## 2023-12-11 NOTE — Evaluation (Signed)
 Occupational Therapy Evaluation Patient Details Name: Gail Lynch MRN: [id number] DOB: 1941-02-18 Today's Date: 12/11/2023   History of Present Illness   Patient is a 83 year old female who presented with weakness on R side on 4/21. Patient was recently in ED from ALF after fall. Patient was admitted with stroke like symptoms, AKI. PMH: dementia, CKD III, HTN, av block s/p pacemaker, aortic stenosis, and IDDM II.     Clinical Impressions Patient is a 83 year old female who was admitted for above. Patient was living at ALF level with daughter reporting patient used RW some and was able to complete majority of her ADLs herself at ALF. Currently, patient is +2 for transfers with strong R lean when sitting upright with poor awareness of deficits. Patient was noted to have decreased functional activity tolerance, decreased endurance, decreased standing balance, decreased safety awareness, and decreased knowledge of AD/AE impacting participation in ADLs. Patient will benefit from continued inpatient follow up therapy, <3 hours/day.      If plan is discharge home, recommend the following:   Two people to help with walking and/or transfers;A lot of help with bathing/dressing/bathroom;Assistance with cooking/housework;Direct supervision/assist for medications management;Assist for transportation;Help with stairs or ramp for entrance;Direct supervision/assist for financial management     Functional Status Assessment   Patient has had a recent decline in their functional status and demonstrates the ability to make significant improvements in function in a reasonable and predictable amount of time.     Equipment Recommendations   None recommended by OT      Precautions/Restrictions   Precautions Precautions: Fall Recall of Precautions/Restrictions: Impaired Precaution/Restrictions Comments: hears best on L ear Restrictions Weight Bearing Restrictions Per Provider Order: No      Mobility Bed Mobility Overal bed mobility: Needs Assistance Bed Mobility: Supine to Sit, Sit to Supine     Supine to sit: Mod assist, +2 for physical assistance, +2 for safety/equipment Sit to supine: +2 for physical assistance, +2 for safety/equipment, Mod assist   General bed mobility comments: with cues for rolling and advancement to EOB.           Balance Overall balance assessment: Needs assistance Sitting-balance support: Bilateral upper extremity supported, Feet supported Sitting balance-Leahy Scale: Poor   Postural control: Right lateral lean Standing balance support: Reliant on assistive device for balance, Bilateral upper extremity supported Standing balance-Leahy Scale: Poor       ADL either performed or assessed with clinical judgement   ADL Overall ADL's : Needs assistance/impaired   Eating/Feeding Details (indicate cue type and reason): patient asking for food but unclear if patient is cleared to eat at this time. Grooming: Minimal assistance;Bed level Grooming Details (indicate cue type and reason): unable to maintain sitting posture without physical A on R side with right lateral lean. Upper Body Bathing: Bed level;Moderate assistance   Lower Body Bathing: Bed level;Maximal assistance   Upper Body Dressing : Bed level;Moderate assistance   Lower Body Dressing: Bed level;Maximal assistance   Toilet Transfer: Moderate assistance;+2 for physical assistance;+2 for safety/equipment;Stand-pivot;Rolling walker (2 wheels) Toilet Transfer Details (indicate cue type and reason): to chair in room and back to stretcher bed in ED room. Toileting- Architect and Hygiene: Sit to/from stand;Total assistance Toileting - Clothing Manipulation Details (indicate cue type and reason): TD for hygiene and toileting clothing management. patient needing BUE support to maintain balance with physical A.             Vision Patient Visual Report: No change from  baseline              Pertinent Vitals/Pain Pain Assessment Pain Assessment: No/denies pain     Extremity/Trunk Assessment Upper Extremity Assessment Upper Extremity Assessment: Right hand dominant;RUE deficits/detail;LUE deficits/detail RUE Deficits / Details: ROM WFL strength was 3+/5 LUE Deficits / Details: WFL ROM strength was 4-/5   Lower Extremity Assessment Lower Extremity Assessment: Defer to PT evaluation   Cervical / Trunk Assessment Cervical / Trunk Assessment: Kyphotic   Communication Communication Communication: Impaired Factors Affecting Communication: Hearing impaired   Cognition Arousal: Alert Behavior During Therapy: Flat affect Cognition: Cognition impaired   Orientation impairments: Place, Time, Situation Awareness: Intellectual awareness impaired, Online awareness impaired     Executive functioning impairment (select all impairments): Initiation, Organization, Sequencing, Reasoning, Problem solving OT - Cognition Comments: patient appeared to have no awareness of deficits on R side. patient HOH hears best out of L ear.                                    Home Living Family/patient expects to be discharged to:: Assisted living     Type of Home: Other(Comment) (ALF)                       Home Equipment: Rolling Walker (2 wheels);Cane - single point   Additional Comments: patient has resident at [facility] in memory care unit per daughter report.      Prior Functioning/Environment               Mobility Comments: (P) ambulatory,uses RW intermittent ADLs Comments: daughter indicated patient was able to complete 90% of ADLs herself with some use of RW.    OT Problem List: Decreased activity tolerance;Impaired balance (sitting and/or standing);Decreased safety awareness;Decreased knowledge of precautions;Decreased knowledge of use of DME or AE   OT Treatment/Interventions: Self-care/ADL training;Energy  conservation;Therapeutic exercise;Therapeutic activities;Patient/family education;Balance training      OT Goals(Current goals can be found in the care plan section)   Acute Rehab OT Goals Patient Stated Goal: not to go back to ALF (daughter says she does not like her ALF) OT Goal Formulation: With patient/family Time For Goal Achievement: 12/25/23 Potential to Achieve Goals: Fair   OT Frequency:  Min 2X/week       AM-PAC OT "6 Clicks" Daily Activity     Outcome Measure Help from another person eating meals?: A Little Help from another person taking care of personal grooming?: A Little Help from another person toileting, which includes using toliet, bedpan, or urinal?: A Lot Help from another person bathing (including washing, rinsing, drying)?: A Lot Help from another person to put on and taking off regular upper body clothing?: A Little Help from another person to put on and taking off regular lower body clothing?: A Lot 6 Click Score: 15   End of Session Equipment Utilized During Treatment: Gait belt;Rolling walker (2 wheels) Nurse Communication: Mobility status  Activity Tolerance: Patient tolerated treatment well Patient left: in bed;with call bell/phone within reach;with family/visitor present (in ED room)  OT Visit Diagnosis: Unsteadiness on feet (R26.81);Other abnormalities of gait and mobility (R26.89);Muscle weakness (generalized) (M62.81)                Time: 1610-9604 OT Time Calculation (min): 25 min Charges:  OT General Charges $OT Visit: 1 Visit OT Evaluation $OT Eval Moderate Complexity: 1 Mod  Gail Lynch OTR/L, MS  Acute Rehabilitation Department Office# [phone]   Gail Lynch 12/11/2023, 10:33 AM

## 2023-12-11 NOTE — Evaluation (Signed)
 Speech Language Pathology Evaluation Patient Details Name: Gail Lynch MRN: [id number] DOB: Dec 20, 1940 Today's Date: 12/11/2023 Time: 6962-9528 SLP Time Calculation (min) (ACUTE ONLY): 27 min  Problem List:  Patient Active Problem List   Diagnosis Date Noted   Stroke-like symptom 12/10/2023   Hemorrhoids 05/01/2022   Pneumonia 04/26/2022   Hypokalemia 04/25/2022   COVID-19 virus infection 04/25/2022   CAP (community acquired pneumonia) 04/25/2022   Anemia 04/25/2022   AKI (acute kidney injury) (HCC) 04/25/2022   Sacral wound 04/25/2022   Living in nursing home 09/13/2021   Involuntary commitment 09/13/2021   Severe CADASIL dementia with agitation (HCC) 09/13/2021   Dementia (HCC) 07/04/2021   Non compliance with medical treatment 07/31/2020   Psychosis in elderly (HCC) 07/29/2020   Diabetic leg ulcer (HCC) 07/29/2020   Benign neoplasm of ascending colon    Benign neoplasm of transverse colon    Benign neoplasm of descending colon    Benign neoplasm of sigmoid colon    History of colon polyps 05/04/2020   Complete AV block (HCC) 03/09/2020   Mild aortic stenosis 03/14/2017   Peripheral edema 03/14/2017   Arm heaviness 03/14/2017   CKD (chronic kidney disease), stage III (HCC) 05/20/2016   Uncontrolled type 2 diabetes mellitus with hypoglycemia, with long-term current use of insulin  (HCC) 01/03/2016   Dysuria 12/09/2014   Peripheral neuropathy 12/05/2013   Irregular heart beat 12/05/2013   PVC (premature ventricular contraction) 12/05/2013   Sciatica 06/17/2013   Pain due to onychomycosis of toenail 05/21/2013   Mycotic toenails    Vagina bleeding 08/06/2012   Carotid stenosis 06/18/2012   Constipation 04/30/2012   Childhood asthma 12/18/2011   Glaucoma 12/18/2011   Hyperlipidemia 12/18/2011   Colon polyps 12/18/2011   Preventative health care 12/17/2011   Hypertension 12/17/2011   Hard of hearing    Dysplasia of cervix, low grade (CIN 1)    HPV (human papilloma  virus) infection    Brain mass 09/18/2011   Past Medical History:  Past Medical History:  Diagnosis Date   Acute lower GI bleeding 05/03/2020   Arthritis    gout in feet   Asthma    hx as a child - no inhaler, no problems   Carotid stenosis 06/18/2012   Colon polyps    Diabetes mellitus    Type 2   Dysplasia of cervix, low grade (CIN 1)    s/p cervical conization   Glaucoma    Glaucoma 12/18/2011   Hard of hearing    hear aids - left   HPV (human papilloma virus) infection    Hyperlipidemia    Hypertension    Intrinsic asthma, unspecified 12/18/2011   Mild aortic stenosis 03/14/2017   Mycotic toenails 02/19/2013   PONV (postoperative nausea and vomiting)    SVD (spontaneous vaginal delivery)    x 4   Past Surgical History:  Past Surgical History:  Procedure Laterality Date   BREAST BIOPSY  2010   BREAST BIOPSY  1991   COLONOSCOPY     COLONOSCOPY WITH PROPOFOL  N/A 05/06/2020   Procedure: COLONOSCOPY WITH PROPOFOL ;  Surgeon: Nannette Babe, MD;  Location: [facility] ENDOSCOPY;  Service: Endoscopy;  Laterality: N/A;   DILATION AND CURETTAGE OF UTERUS  2000   HYSTEROSCOPY WITH D & C N/A 03/04/2014   Procedure: DILATATION AND CURETTAGE /HYSTEROSCOPY;  Surgeon: Lenord Radon, MD;  Location: [facility] ORS;  Service: Gynecology;  Laterality: N/A;   KNEE SURGERY  2010   patella fracture   MULTIPLE TOOTH EXTRACTIONS  PACEMAKER IMPLANT N/A 03/09/2020   Procedure: PACEMAKER IMPLANT;  Surgeon: Lei Pump, MD;  Location: [facility] INVASIVE CV LAB;  Service: Cardiovascular;  Laterality: N/A;   POLYPECTOMY     POLYPECTOMY  05/06/2020   Procedure: POLYPECTOMY;  Surgeon: Nannette Babe, MD;  Location: [facility] ENDOSCOPY;  Service: Endoscopy;;   HPI:  Gail Lynch is an 83 y.o. female who fell at her ALF 4/18 and was taken to the ER on Saturday 4/19, trauma work-up was done and patient was discharged back to ALF.  She was noticed to have weakness in the right arm so she was brought back to [facility] ED on  4/21. PMH of HTN, DMT2, CKD, aortic stenosis, AV nodal block status post pacemaker, dementia, chronic anemia.   Assessment / Plan / Recommendation Clinical Impression  Pt presents with cognitive-linguistic function that is close to baseline. Daughter  (Ceydrick) was at the bedside; pt has a legal guardian (SW) per EMR.  Per Ceydrick, pt's response time is a bit slower, but her cognition is otherwise c/w baseline.  Speech is clear and fluent. Pt oriented to self.  She has preserved long-term recall but difficulty with short-term recall and working memory PTA.  She is able to follow functional commands, make her needs known, and engage in social communication without difficulty. She passed Stroke Swallow Screen per RN. There are no acute SLP needs identified. Our service will sign off.    SLP Assessment  SLP Recommendation/Assessment: Patient does not need any further Speech Lanaguage Pathology Services SLP Visit Diagnosis: Cognitive communication deficit (R41.841)    Recommendations for follow up therapy are one component of a multi-disciplinary discharge planning process, led by the attending physician.  Recommendations may be updated based on patient status, additional functional criteria and insurance authorization.    Follow Up Recommendations  No SLP follow up                       SLP Evaluation Cognition  Overall Cognitive Status: History of cognitive impairments - at baseline Arousal/Alertness: Awake/alert Orientation Level: Disoriented to situation Attention: Selective Selective Attention: Impaired Selective Attention Impairment: Verbal basic Memory: Impaired Memory Impairment: Decreased short term memory Decreased Short Term Memory: Verbal basic Awareness: Impaired Safety/Judgment: Impaired       Comprehension  Auditory Comprehension Overall Auditory Comprehension: Appears within functional limits for tasks assessed Yes/No Questions: Within Functional Limits (for  biographical info) Commands: Within Functional Limits (simple, functional commands) Conversation: Simple Visual Recognition/Discrimination Discrimination: Not tested Reading Comprehension Reading Status: Not tested    Expression Expression Primary Mode of Expression: Verbal Verbal Expression Overall Verbal Expression: Appears within functional limits for tasks assessed Level of Generative/Spontaneous Verbalization: Music therapist Expression Dominant Hand: Right   Oral / Motor  Oral Motor/Sensory Function Overall Oral Motor/Sensory Function: Within functional limits Motor Speech Overall Motor Speech: Appears within functional limits for tasks assessed            Myna Asal Laurice 12/11/2023, 10:24 AM  Mylinda Asa L. Beatris Lincoln, MA CCC/SLP Clinical Specialist - Acute Care SLP Acute Rehabilitation Services Office number [phone]

## 2023-12-11 NOTE — ED Notes (Signed)
 Admitting Provider at bedside.

## 2023-12-12 ENCOUNTER — Observation Stay (HOSPITAL_COMMUNITY)

## 2023-12-12 ENCOUNTER — Observation Stay (HOSPITAL_BASED_OUTPATIENT_CLINIC_OR_DEPARTMENT_OTHER)

## 2023-12-12 DIAGNOSIS — I639 Cerebral infarction, unspecified: Secondary | ICD-10-CM

## 2023-12-12 DIAGNOSIS — R29704 NIHSS score 4: Secondary | ICD-10-CM

## 2023-12-12 DIAGNOSIS — R2990 Unspecified symptoms and signs involving the nervous system: Secondary | ICD-10-CM | POA: Diagnosis not present

## 2023-12-12 DIAGNOSIS — R29818 Other symptoms and signs involving the nervous system: Secondary | ICD-10-CM | POA: Diagnosis not present

## 2023-12-12 DIAGNOSIS — I6389 Other cerebral infarction: Secondary | ICD-10-CM | POA: Diagnosis not present

## 2023-12-12 LAB — CBC
HCT: 35.8 % — ABNORMAL LOW (ref 36.0–46.0)
Hemoglobin: 11.1 g/dL — ABNORMAL LOW (ref 12.0–15.0)
MCH: 27.1 pg (ref 26.0–34.0)
MCHC: 31 g/dL (ref 30.0–36.0)
MCV: 87.3 fL (ref 80.0–100.0)
Platelets: 251 10*3/uL (ref 150–400)
RBC: 4.1 MIL/uL (ref 3.87–5.11)
RDW: 14.1 % (ref 11.5–15.5)
WBC: 6.6 10*3/uL (ref 4.0–10.5)
nRBC: 0 % (ref 0.0–0.2)

## 2023-12-12 LAB — BASIC METABOLIC PANEL WITH GFR
Anion gap: 8 (ref 5–15)
BUN: 18 mg/dL (ref 8–23)
CO2: 22 mmol/L (ref 22–32)
Calcium: 8.4 mg/dL — ABNORMAL LOW (ref 8.9–10.3)
Chloride: 109 mmol/L (ref 98–111)
Creatinine, Ser: 1.47 mg/dL — ABNORMAL HIGH (ref 0.44–1.00)
GFR, Estimated: 35 mL/min — ABNORMAL LOW (ref >60)
Glucose, Bld: 189 mg/dL — ABNORMAL HIGH (ref 70–99)
Potassium: 4.5 mmol/L (ref 3.5–5.1)
Sodium: 139 mmol/L (ref 135–145)

## 2023-12-12 LAB — GLUCOSE, CAPILLARY
Glucose-Capillary: 176 mg/dL — ABNORMAL HIGH (ref 70–99)
Glucose-Capillary: 204 mg/dL — ABNORMAL HIGH (ref 70–99)
Glucose-Capillary: 257 mg/dL — ABNORMAL HIGH (ref 70–99)
Glucose-Capillary: 151 mg/dL — ABNORMAL HIGH (ref 70–99)

## 2023-12-12 MED ORDER — ROSUVASTATIN CALCIUM 20 MG PO TABS
40.0000 mg | ORAL_TABLET | Freq: Every day | ORAL | Status: DC
  Administered 2023-12-12 – 2023-12-13 (×2): 40 mg via ORAL
  Filled 2023-12-12 (×2): qty 2

## 2023-12-12 MED ORDER — CLOPIDOGREL BISULFATE 75 MG PO TABS
75.0000 mg | ORAL_TABLET | Freq: Every day | ORAL | Status: DC
  Administered 2023-12-12 – 2023-12-13 (×2): 75 mg via ORAL
  Filled 2023-12-12 (×2): qty 1

## 2023-12-12 NOTE — Progress Notes (Addendum)
 STROKE TEAM PROGRESS NOTE   INTERIM HISTORY/SUBJECTIVE She presented with 3-day history of right face and upper extremity weakness. Daughter is at the bedside. MRI/MRA brain ordered.  CT scan shows subacute to early left basal ganglia infarct.  Echocardiogram showed ejection fraction of 55 to 60%.  Left atrial size is normal. Patient is sitting in the chair in no apparent distress  CT head with asymmetric hypodensity in the posterior limb of left internal capsule.  On exam patient has some right side weakness and weak right grip  CBC    Component Value Date/Time   WBC 6.6 12/12/2023 0448   RBC 4.10 12/12/2023 0448   HGB 11.1 (L) 12/12/2023 0448   HCT 35.8 (L) 12/12/2023 0448   PLT 251 12/12/2023 0448   MCV 87.3 12/12/2023 0448   MCH 27.1 12/12/2023 0448   MCHC 31.0 12/12/2023 0448   RDW 14.1 12/12/2023 0448   LYMPHSABS 1.6 12/10/2023 1044   MONOABS 0.5 12/10/2023 1044   EOSABS 0.1 12/10/2023 1044   BASOSABS 0.0 12/10/2023 1044    BMET    Component Value Date/Time   NA 139 12/12/2023 0448   K 4.5 12/12/2023 0448   CL 109 12/12/2023 0448   CO2 22 12/12/2023 0448   GLUCOSE 189 (H) 12/12/2023 0448   BUN 18 12/12/2023 0448   CREATININE 1.47 (H) 12/12/2023 0448   CALCIUM  8.4 (L) 12/12/2023 0448   GFRNONAA 35 (L) 12/12/2023 0448    IMAGING past 24 hours ECHOCARDIOGRAM COMPLETE Result Date: 12/11/2023    ECHOCARDIOGRAM REPORT   Patient Name:   SALEEN PEDEN Date of Exam: 12/11/2023 Medical Rec #:  [id number]     Height:       63.0 in Accession #:    [id number]    Weight:       176.4 lb Date of Birth:  1941-02-27     BSA:          1.833 m Patient Age:    82 years      BP:           151/61 mmHg Patient Gender: F             HR:           84 bpm. Exam Location:  Inpatient Procedure: 2D Echo, Color Doppler, Cardiac Doppler and Intracardiac            Opacification Agent (Both Spectral and Color Flow Doppler were            utilized during procedure). Indications:    Stroke  History:         Patient has prior history of Echocardiogram examinations, most                 recent 04/26/2022. Risk Factors:Hypertension and Diabetes.  Sonographer:    Sulema Endo RDCS, FE, PE Referring Phys: [phone] [facility]  Sonographer Comments: Suboptimal apical window. IMPRESSIONS  1. Abnormal (paradoxical) septal motion, consistent with either left bundle branch block or pacemaker. Left ventricular ejection fraction, by estimation, is 55 to 60%. The left ventricle has normal function. The left ventricle has no regional wall motion abnormalities. Left ventricular diastolic parameters are consistent with Grade II diastolic dysfunction (pseudonormalization). Elevated left atrial pressure. The E/e' is 23.  2. Right ventricular systolic function is normal. The right ventricular size is normal.  3. The mitral valve is degenerative. No evidence of mitral valve regurgitation. No evidence of mitral stenosis. Moderate mitral annular calcification.  4. The aortic  valve is calcified. Aortic valve regurgitation is not visualized. Mild aortic stenosis (DI 0.5,peak velocity 1.61m/s, MG 5.80mmHG, AVA VTI 1.1cm2).  5. The inferior vena cava is normal in size with greater than 50% respiratory variability, suggesting right atrial pressure of 3 mmHg. Conclusion(s)/Recommendation(s): No intracardiac source of embolism detected on this transthoracic study. Consider a transesophageal echocardiogram to exclude cardiac source of embolism if clinically indicated. No left ventricular mural or apical thrombus/thrombi. FINDINGS  Left Ventricle: Abnormal (paradoxical) septal motion, consistent with either left bundle branch block or pacemaker. Left ventricular ejection fraction, by estimation, is 55 to 60%. The left ventricle has normal function. The left ventricle has no regional wall motion abnormalities. Definity  contrast agent was given IV to delineate the left ventricular endocardial borders. The left ventricular internal cavity size was  normal in size. There is no left ventricular hypertrophy. Left ventricular diastolic parameters are consistent with Grade II diastolic dysfunction (pseudonormalization). Elevated left atrial pressure. The E/e' is 58. Right Ventricle: The right ventricular size is normal. Right vetricular wall thickness was not well visualized. Right ventricular systolic function is normal. Left Atrium: Left atrial size was normal in size. Right Atrium: Right atrial size was normal in size. Pericardium: There is no evidence of pericardial effusion. Mitral Valve: The mitral valve is degenerative in appearance. Moderate mitral annular calcification. No evidence of mitral valve regurgitation. No evidence of mitral valve stenosis. Tricuspid Valve: The tricuspid valve is normal in structure. Tricuspid valve regurgitation is not demonstrated. No evidence of tricuspid stenosis. Aortic Valve: The aortic valve is calcified. Aortic valve regurgitation is not visualized. Mild aortic stenosis (DI 0.5,peak velocity 1.87m/s, MG 5.13mmHG, AVA VTI 1.1cm2). Aortic valve mean gradient measures 5.5 mmHg. Aortic valve peak gradient measures 10.7 mmHg. Aortic valve area, by VTI measures 1.13 cm. Pulmonic Valve: The pulmonic valve was normal in structure. Pulmonic valve regurgitation is not visualized. No evidence of pulmonic stenosis. Aorta: The aortic root is normal in size and structure. Venous: The inferior vena cava is normal in size with greater than 50% respiratory variability, suggesting right atrial pressure of 3 mmHg. IAS/Shunts: The interatrial septum was not well visualized. Additional Comments: A device lead is visualized.  LEFT VENTRICLE PLAX 2D LVIDd:         4.30 cm   Diastology LVIDs:         3.10 cm   LV e' medial:    6.85 cm/s LV PW:         1.00 cm   LV E/e' medial:  25.5 LV IVS:        1.10 cm   LV e' lateral:   8.81 cm/s LVOT diam:     1.70 cm   LV E/e' lateral: 19.9 LV SV:         35 LV SV Index:   19 LVOT Area:     2.27 cm  RIGHT  VENTRICLE RV S prime:     10.20 cm/s TAPSE (M-mode): 1.5 cm LEFT ATRIUM             Index        RIGHT ATRIUM           Index LA Vol (A2C):   46.0 ml 25.10 ml/m  RA Area:     14.80 cm LA Vol (A4C):   53.6 ml 29.24 ml/m  RA Volume:   35.80 ml  19.53 ml/m LA Biplane Vol: 52.9 ml 28.86 ml/m  AORTIC VALVE AV Area (Vmax):    1.18 cm AV  Area (Vmean):   1.15 cm AV Area (VTI):     1.13 cm AV Vmax:           163.50 cm/s AV Vmean:          110.000 cm/s AV VTI:            0.310 m AV Peak Grad:      10.7 mmHg AV Mean Grad:      5.5 mmHg LVOT Vmax:         85.20 cm/s LVOT Vmean:        55.900 cm/s LVOT VTI:          0.155 m LVOT/AV VTI ratio: 0.50  AORTA Ao Root diam: 2.70 cm MITRAL VALVE MV Area (PHT): 4.10 cm     SHUNTS MV Decel Time: 185 msec     Systemic VTI:  0.16 m MV E velocity: 175.00 cm/s  Systemic Diam: 1.70 cm Sunit Tolia Electronically signed by Olinda Bertrand Signature Date/Time: 12/11/2023/6:39:09 PM    Final     Vitals:   12/11/23 1555 12/11/23 1959 12/11/23 2321 12/12/23 0756  BP: (!) 162/69 (!) 147/66  (!) 153/68  Pulse: 89 80  78  Resp: 15 19  14   Temp: 98.7 F (37.1 C) 99.2 F (37.3 C) 99.1 F (37.3 C) 98.2 F (36.8 C)  TempSrc: Oral Oral Oral Oral  SpO2: 100% 98%  98%  Weight:      Height:         PHYSICAL EXAM General:  Alert, well-nourished, well-developed patient in no acute distress Psych:  Mood and affect appropriate for situation CV: Regular rate and rhythm on monitor Respiratory:  Regular, unlabored respirations on room air GI: Abdomen soft and nontender   NEURO:  Mental Status: AA&O to self, city, and month.  Age she says" in the 23s" Speech/Language: speech is without aphasia.  Mild dysarthria naming, repetition, fluency, and comprehension intact.  Cranial Nerves:  II: PERRL. Visual fields full.  III, IV, VI: EOMI. Eyelids elevate symmetrically.  V: Sensation is intact to light touch and symmetrical to face.  VII: Face is asymmetrical resting and smiling with  right lower facial weakness. VIII: hearing intact to voice. IX, X: Palate elevates symmetrically. Phonation is normal.  WU:[id number] shrug 5/5. XII: tongue is midline without fasciculations. Motor: 5/5 strength in left arm and leg, right arm.  Right leg 2/5.  Right hand grip weak Tone: is normal and bulk is normal Sensation- Intact to light touch bilaterally. Extinction absent to light touch to DSS.   Coordination: FTN intact bilaterally, HKS: no ataxia in BLE.No drift.  Gait- deferred   ASSESSMENT/PLAN  Ms. Adamarie Izzo is a 83 y.o. female with history of hypertension, diabetes type 2, CKD, aortic stenosis, AV block status post pacemaker, dementia and anemia who presents from her assisted living facility with right arm weakness.   NIH on Admission 4  Acute Ischemic Infarct:  left posterior internal capsule Etiology: Small vessel disease CT head Asymmetric hypodensity within the posterior limb of left internal capsule, chronic small vessel disease, right thalamic lacunar infarct, atrophy MRI ordered MRA ordered Carotid Doppler ordered 2D Echo EF 55 to 60%.  LV with grade 2 diastolic dysfunction.  MV with moderate annular calcification.  Mild aortic stenosis LDL 155 HgbA1c 8.8 VTE prophylaxis -Lovenox  No antithrombotic prior to admission, now on clopidogrel  75 mg daily has allergy to aspirin Therapy recommendations:  Pending Disposition: Pending  Hx of Stroke/TIA Dementia Hard of hearing Right thalamic lacunar  infarct   Hypertension Mild aortic stenosis Carotid stenosis S/p PPM Home meds: HCTZ 12.5 mg, Stable Blood Pressure Goal: SBP less than 160 or BP less than 220/110   Hyperlipidemia Home meds: Crestor  40,  resumed in hospital LDL 155, goal < 70 Continue statin at discharge  Diabetes type II UnControlled Home meds: Insulin  HgbA1c 8.8, goal < 7.0 CBGs SSI Recommend close follow-up with PCP for better DM control   Dysphagia Patient has post-stroke dysphagia,  SLP consulted    Diet   Diet Carb Modified Fluid consistency: Thin; Room service appropriate? Yes   Advance diet as tolerated  Other Stroke Risk Factors Obesity, Body mass index is 31.24 kg/m., BMI >/= 30 associated with increased stroke risk, recommend weight loss, diet and exercise as appropriate    Other Active Problems CKD  Hospital day # 0   Jonette Nestle DNP, ACNPC-AG  [facility]  I have personally obtained history,examined this patient, reviewed notes, independently viewed imaging studies, participated in medical decision making and plan of care.ROS completed by me personally and pertinent positives fully documented  I have made any additions or clarifications directly to the above note. Agree with note above.  Patient presented with a few days history of slurred speech and right face and upper extremity weakness secondary to left brain subcortical infarct likely from small vessel disease.  Recommend Plavix  alone as patient is allergic to aspirin.  Continue ongoing stroke workup.  Aggressive risk factor modification.  Physical occupational speech therapy consults.  Long discussion with patient and answered questions.  Discussed with Dr. Zelda Hickman.  Greater than 50% time during this 50-minute visit was spent in counseling and coordination of care about her lacunar stroke and discussion about stroke evaluation prevention treatment and answering questions.  Ardella Beaver, MD Medical Director [facility] Pager: [phone] 12/12/2023 5:37 PM   To contact Stroke Continuity provider, please refer to [email or web address]. After hours, contact General Neurology

## 2023-12-12 NOTE — Progress Notes (Signed)
 Physical Therapy Treatment Patient Details Name: Gail Lynch MRN: [id number] DOB: 1941/08/12 Today's Date: 12/12/2023   History of Present Illness The pt is an 83 yo female presenting initially 4/18 after a fall at her ALF, was d/c back to ALF on 4/19, then had acute onset RUE weakness and returned to ED on 4/21. CT suspicious for acute CVA, MRI pending. PMH includes: HTN, AV block s/p PPM, CKD III, dementia, DM II, and obesity (BMI 31).    PT Comments  The pt was agreeable to session, able to complete multiple sit-stand transfers and pivotal steps with improved stability in RLE and was able to progress to minA with sit-stand transfers from recliner with use of LUE on armrest to power up to standing. The pt continues to demo impaired motor planning, coordination, balance, and strength in R extremities this session, most evident in her difficulty with standing marches. Will continue to benefit from skilled PT to progress functional strength and gait training.    If plan is discharge home, recommend the following: Two people to help with walking and/or transfers;A lot of help with bathing/dressing/bathroom;Assist for transportation   Can travel by private vehicle     No  Equipment Recommendations  Wheelchair cushion (measurements PT);Wheelchair (measurements PT)    Recommendations for Other Services       Precautions / Restrictions Precautions Precautions: Fall Recall of Precautions/Restrictions: Impaired Precaution/Restrictions Comments: hears best on L ear Restrictions Weight Bearing Restrictions Per Provider Order: No     Mobility  Bed Mobility Overal bed mobility: Needs Assistance Bed Mobility: Supine to Sit     Supine to sit: Mod assist, +2 for physical assistance, +2 for safety/equipment     General bed mobility comments: pt OOB on BSC at start of session and in recliner at end of session    Transfers Overall transfer level: Needs assistance Equipment used: 1 person  hand held assist Transfers: Sit to/from Stand, Bed to chair/wheelchair/BSC Sit to Stand: Mod assist, Min assist   Step pivot transfers: Mod assist       General transfer comment: modA to stand for pericare, then progressed to minA from recliner wiht continued reps. modA with blocking R knee to complete pivotal steps to recliner. no buckling this session    Ambulation/Gait               General Gait Details: limited to lateral steps from [facility] to chair, pt unable to complete standing marches when cued   Stairs             Wheelchair Mobility     Tilt Bed    Modified Rankin (Stroke Patients Only) Modified Rankin (Stroke Patients Only) Pre-Morbid Rankin Score: Moderate disability Modified Rankin: Severe disability     Balance Overall balance assessment: Needs assistance, History of Falls Sitting-balance support: Bilateral upper extremity supported, Feet supported Sitting balance-Leahy Scale: Fair Sitting balance - Comments: can static sit without external support, leans R Postural control: Right lateral lean Standing balance support: Reliant on assistive device for balance, Bilateral upper extremity supported Standing balance-Leahy Scale: Poor Standing balance comment: right lean, R knee buckle                            Communication Communication Communication: Impaired Factors Affecting Communication: Hearing impaired  Cognition Arousal: Alert Behavior During Therapy: Flat affect   PT - Cognitive impairments: History of cognitive impairments  PT - Cognition Comments: difficulty  to follow with [facility], does follow simple  directions when she hears, not formally assessed in this session Following commands: Impaired Following commands impaired: Follows one step commands inconsistently, Follows one step commands with increased time    Cueing Cueing Techniques: Verbal cues, Gestural cues, Visual cues  Exercises Other  Exercises Other Exercises: sit-stand x5 Other Exercises: attempted standing marches, but unabl e to clear her feet    General Comments General comments (skin integrity, edema, etc.): VSS on RA      Pertinent Vitals/Pain Pain Assessment Pain Assessment: No/denies pain     PT Goals (current goals can now be found in the care plan section) Acute Rehab PT Goals Patient Stated Goal: to walk PT Goal Formulation: With patient/family Time For Goal Achievement: 12/25/23 Potential to Achieve Goals: Good Progress towards PT goals: Progressing toward goals    Frequency    Min 2X/week       AM-PAC PT "6 Clicks" Mobility   Outcome Measure  Help needed turning from your back to your side while in a flat bed without using bedrails?: A Lot Help needed moving from lying on your back to sitting on the side of a flat bed without using bedrails?: A Lot Help needed moving to and from a bed to a chair (including a wheelchair)?: A Lot Help needed standing up from a chair using your arms (e.g., wheelchair or bedside chair)?: A Lot Help needed to walk in hospital room?: Total Help needed climbing 3-5 steps with a railing? : Total 6 Click Score: 10    End of Session Equipment Utilized During Treatment: Gait belt Activity Tolerance: Patient tolerated treatment well Patient left: in chair;with call bell/phone within reach;with family/visitor present (on [facility]) Nurse Communication: Mobility status PT Visit Diagnosis: Unsteadiness on feet (R26.81)     Time: 4034-7425 PT Time Calculation (min) (ACUTE ONLY): 13 min  Charges:    $Therapeutic Exercise: 8-22 mins $Therapeutic Activity: 8-22 mins PT General Charges $$ ACUTE PT VISIT: 1 Visit                     Barnabas Booth, PT, DPT   Acute Rehabilitation Department Office [phone] Secure Chat Communication Preferred   Lona Rist 12/12/2023, 3:34 PM

## 2023-12-12 NOTE — Progress Notes (Signed)
 Physical Therapy Treatment Patient Details Name: Gail Lynch MRN: [id number] DOB: Dec 19, 1940 Today's Date: 12/12/2023   History of Present Illness The pt is an 83 yo female presenting initially 4/18 after a fall at her ALF, was d/c back to ALF on 4/19, then had acute onset RUE weakness and returned to ED on 4/21. CT suspicious for acute CVA, MRI pending. PMH includes: HTN, AV block s/p PPM, CKD III, dementia, DM II, and obesity (BMI 31).    PT Comments  Pt agreeable to session, able to make good progress with OOB mobility and sit-stand transfers. Initially, pt needing minA of 2 to rise to standing at EOB, but was unable to grasp RW with RUE, and therefore transfer attempted with BUE support and pt needed modA. Pt limited this session by need for BM, but pt was able to practice lateral stepping with modA of 2 to pivot to [facility]. X2 instances of R knee buckle. Continues to benefit from assist at hips and blocking R knee to safely manage transfer.  Continue to recommend post-acute therapies to facilitate improvement in RLE strength, stability, coordination, and activity tolerance prior to return to her assisted living facility.    If plan is discharge home, recommend the following: Two people to help with walking and/or transfers;A lot of help with bathing/dressing/bathroom;Assist for transportation   Can travel by private vehicle     No  Equipment Recommendations  Wheelchair cushion (measurements PT);Wheelchair (measurements PT)    Recommendations for Other Services       Precautions / Restrictions Precautions Precautions: Fall Recall of Precautions/Restrictions: Impaired Precaution/Restrictions Comments: hears best on L ear Restrictions Weight Bearing Restrictions Per Provider Order: No     Mobility  Bed Mobility Overal bed mobility: Needs Assistance Bed Mobility: Supine to Sit     Supine to sit: Mod assist, +2 for physical assistance, +2 for safety/equipment     General bed  mobility comments: assist to move LE and then to elevate trunk.    Transfers Overall transfer level: Needs assistance Equipment used: Rolling walker (2 wheels) Transfers: Sit to/from Stand, Bed to chair/wheelchair/BSC Sit to Stand: Mod assist, +2 safety/equipment, Min assist   Step pivot transfers: Mod assist, +2 physical assistance, +2 safety/equipment       General transfer comment: initially minA of 2 to stand, pt with poor grip on RUE, modA of 2 without RW. x2 instances of R knee buckle with stepping.    Ambulation/Gait               General Gait Details: limited to lateral steps from bed to [employer]      Modified Rankin (Stroke Patients Only) Modified Rankin (Stroke Patients Only) Pre-Morbid Rankin Score: Moderate disability Modified Rankin: Severe disability     Balance Overall balance assessment: Needs assistance, History of Falls Sitting-balance support: Bilateral upper extremity supported, Feet supported Sitting balance-Leahy Scale: Fair Sitting balance - Comments: can static sit without external support, leans R Postural control: Right lateral lean Standing balance support: Reliant on assistive device for balance, Bilateral upper extremity supported Standing balance-Leahy Scale: Poor Standing balance comment: right lean, R knee buckle                            Communication Communication Communication: Impaired Factors Affecting Communication: Hearing impaired  Cognition Arousal: Alert Behavior During Therapy: Flat affect   PT - Cognitive impairments: History of cognitive impairments  PT - Cognition Comments: difficulty  to follow with [facility], does follow simple  directions when she hears, not formally assessed in this session Following commands: Impaired Following commands impaired: Follows one step commands inconsistently, Follows one step commands with increased time    Cueing Cueing Techniques: Verbal cues,  Gestural cues, Visual cues  Exercises      General Comments General comments (skin integrity, edema, etc.): VSS on RA      Pertinent Vitals/Pain Pain Assessment Pain Assessment: No/denies pain     PT Goals (current goals can now be found in the care plan section) Acute Rehab PT Goals Patient Stated Goal: to walk PT Goal Formulation: With patient/family Time For Goal Achievement: 12/25/23 Potential to Achieve Goals: Good Progress towards PT goals: Progressing toward goals    Frequency    Min 2X/week       AM-PAC PT "6 Clicks" Mobility   Outcome Measure  Help needed turning from your back to your side while in a flat bed without using bedrails?: A Lot Help needed moving from lying on your back to sitting on the side of a flat bed without using bedrails?: A Lot Help needed moving to and from a bed to a chair (including a wheelchair)?: A Lot Help needed standing up from a chair using your arms (e.g., wheelchair or bedside chair)?: A Lot Help needed to walk in hospital room?: Total Help needed climbing 3-5 steps with a railing? : Total 6 Click Score: 10    End of Session Equipment Utilized During Treatment: Gait belt Activity Tolerance: Patient tolerated treatment well Patient left: in chair;with call bell/phone within reach;with family/visitor present (on [facility]) Nurse Communication: Mobility status PT Visit Diagnosis: Unsteadiness on feet (R26.81)     Time: 1610-9604 PT Time Calculation (min) (ACUTE ONLY): 34 min  Charges:    $Therapeutic Exercise: 8-22 mins $Therapeutic Activity: 8-22 mins PT General Charges $$ ACUTE PT VISIT: 1 Visit                     Barnabas Booth, PT, DPT   Acute Rehabilitation Department Office [phone] Secure Chat Communication Preferred   Lona Rist 12/12/2023, 3:23 PM

## 2023-12-12 NOTE — TOC Initial Note (Addendum)
 Transition of Care [facility]) - Initial/Assessment Note    Patient Details  Name: Gail Lynch MRN: [id number] Date of Birth: 10/10/1940  [facility]) CM/SW Contact:    Jannice Mends, LCSW Phone Number: 12/12/2023, 4:38 PM  Clinical Narrative:                 CSW following for patient's ability to return to [facility].   CSW spoke with Felipa Horsfall at [facility] and she stated patient can return and they will order PT for her since she did not walk in the therapy notes.     Barriers to Discharge: Continued Medical Work up   Patient Goals and CMS Choice            Expected Discharge Plan and Services In-house Referral: Clinical Social Work     Living arrangements for the past 2 months: Assisted Living Facility                                      Prior Living Arrangements/Services Living arrangements for the past 2 months: Assisted Living Facility Lives with:: Facility Resident Patient language and need for interpreter reviewed:: Yes Do you feel safe going back to the place where you live?: Yes      Need for Family Participation in Patient Care: Yes (Comment) Care giver support system in place?: Yes (comment)   Criminal Activity/Legal Involvement Pertinent to Current Situation/Hospitalization: No - Comment as needed  Activities of Daily Living      Permission Sought/Granted Permission sought to share information with : Facility Medical sales representative, Guardian Permission granted to share information with : No     Permission granted to share info w AGENCY: [employer]  Permission granted to share info w Relationship: Guardian     Emotional Assessment Appearance:: Appears stated age Attitude/Demeanor/Rapport: Unable to Assess Affect (typically observed): Unable to Assess Orientation: : Oriented to Self Alcohol / Substance Use: Not Applicable Psych Involvement: No (comment)  Admission diagnosis:  Stroke-like symptoms  [R29.90] Stroke-like symptom [R29.90] Patient Active Problem List   Diagnosis Date Noted   Stroke-like symptom 12/10/2023   Hemorrhoids 05/01/2022   Pneumonia 04/26/2022   Hypokalemia 04/25/2022   COVID-19 virus infection 04/25/2022   CAP (community acquired pneumonia) 04/25/2022   Anemia 04/25/2022   AKI (acute kidney injury) (HCC) 04/25/2022   Sacral wound 04/25/2022   Living in nursing home 09/13/2021   Involuntary commitment 09/13/2021   Severe CADASIL dementia with agitation (HCC) 09/13/2021   Dementia (HCC) 07/04/2021   Non compliance with medical treatment 07/31/2020   Psychosis in elderly (HCC) 07/29/2020   Diabetic leg ulcer (HCC) 07/29/2020   Benign neoplasm of ascending colon    Benign neoplasm of transverse colon    Benign neoplasm of descending colon    Benign neoplasm of sigmoid colon    History of colon polyps 05/04/2020   Complete AV block (HCC) 03/09/2020   Mild aortic stenosis 03/14/2017   Peripheral edema 03/14/2017   Arm heaviness 03/14/2017   CKD (chronic kidney disease), stage III (HCC) 05/20/2016   Uncontrolled type 2 diabetes mellitus with hypoglycemia, with long-term current use of insulin  (HCC) 01/03/2016   Dysuria 12/09/2014   Peripheral neuropathy 12/05/2013   Irregular heart beat 12/05/2013   PVC (premature ventricular contraction) 12/05/2013   Sciatica 06/17/2013   Pain due to onychomycosis of toenail 05/21/2013   Mycotic toenails  Vagina bleeding 08/06/2012   Carotid stenosis 06/18/2012   Constipation 04/30/2012   Childhood asthma 12/18/2011   Glaucoma 12/18/2011   Hyperlipidemia 12/18/2011   Colon polyps 12/18/2011   Preventative health care 12/17/2011   Hypertension 12/17/2011   Hard of hearing    Dysplasia of cervix, low grade (CIN 1)    HPV (human papilloma virus) infection    Brain mass 09/18/2011   PCP:  Glynn Lasso, PA-C Pharmacy:  No Pharmacies Listed    Social Drivers of Health (SDOH) Social History: SDOH  Screenings   Food Insecurity: No Food Insecurity (04/26/2022)  Housing: Low Risk  (04/26/2022)  Transportation Needs: No Transportation Needs (04/26/2022)  Utilities: Not At Risk (04/26/2022)  Depression (PHQ2-9): Low Risk  (03/18/2020)  Tobacco Use: Medium Risk (12/10/2023)   SDOH Interventions:     Readmission Risk Interventions    05/01/2022   12:01 PM  Readmission Risk Prevention Plan  Transportation Screening Complete  PCP or Specialist Appt within 3-5 Days Complete  HRI or Home Care Consult Complete  Social Work Consult for Recovery Care Planning/Counseling Complete  Palliative Care Screening Not Applicable  Medication Review Oceanographer) Complete

## 2023-12-12 NOTE — Plan of Care (Signed)

## 2023-12-12 NOTE — Care Management Obs Status (Addendum)
 MEDICARE OBSERVATION STATUS NOTIFICATION   Patient Details  Name: Christmas Faraci MRN: [id number] Date of Birth: 1941/07/06   Medicare Observation Status Notification Given:  Yes    Eusebio High, RN 12/12/2023, 8:47 AM  LATE ENTRY  The Medicare OBS letter was delivered to patient guardian on 12/11/2023

## 2023-12-12 NOTE — Progress Notes (Signed)
 PROGRESS NOTE                                                                                                                                                                                                             Patient Demographics:    Gail Lynch, is a 83 y.o. female, DOB - 1940-11-04, XWR:[id number]  Outpatient Primary MD for the patient is Kurth-Bowen, Cornelia, PA-C    LOS - 0  Admit date - 12/10/2023    Chief Complaint  Patient presents with   Weakness       Brief Narrative (HPI from H&P)   41 yof w/ PMH of dementia AAO x 2, HTN,DMT2,CKD3a b/l creat 1.0, aortic stenosis, AV nodal block status post pacemaker, dementia, chronic anemia, from ALF who had a fall on Friday 12/07/23, then seen at ER on Saturday 4/19, trauma workup was done and patient was discharged back to ALFand  patient was noticed to have weakness in the right arm so she was brought back to the ED on 12/10/23 .patient is a poor historian, but she was able to tell she feels weakness in the right arm and having some problem with swallowing.  Neurology was consulted admitted for further stroke workup   Subjective:    Ferdie Housekeeper today has, No headache, No chest pain, No abdominal pain - No Nausea, No new weakness tingling or numbness, no SOB   Assessment  & Plan :    Right arm weakness, transient expressive aphasia/dysphagia.  CT scan suspicious for acute stroke Fall at ALF on 4/18: Full stroke workup underway, A1c above goal, better glycemic control outpatient suggested at SNF, LDL was above goal hence switched to Crestor  full dose, currently on Plavix .  PT, OT and speech eval.  MRI pending.  Further workup will be deferred to stroke team.  Hypertension - AV block s/p PPM - AS:  Allow permissive hypertension due to acute stroke continue as needed hydralazine , Monitor in telemetry   AKI on CKD stage IIIa: B.l creat ~1.03 eGFR 54. On admit 1.5.   Avoid nephrotoxic continue gentle iv nss and monitor bmp  Dementia:  She is AAO x 2, baseline heard to hear, slow to process, continue delirium precautions supportive care    Class I Obesity w/ Body mass index is 31.24 kg/m.  Follow-up with PCP for weight loss.  IDDM T2 - Control A1c 8.8 indicating poor outpatient control holding p.o. meds continue SSI   Lab Results  Component Value Date   HGBA1C 8.8 (H) 12/10/2023   CBG (last 3)  Recent Labs    12/11/23 1555 12/11/23 2113 12/12/23 0808  GLUCAP 181* 165* 176*   Lab Results  Component Value Date   CHOL 233 (H) 12/10/2023   HDL 52 12/10/2023   LDLCALC 155 (H) 12/10/2023   LDLDIRECT 152.9 12/18/2011   TRIG 129 12/10/2023   CHOLHDL 4.5 12/10/2023        Condition - Fair  Family Communication  : Daughter bedside on 12/12/2023  Code Status :  Full  Consults  :  Neuro  PUD Prophylaxis :     Procedures  :     MRI -  TTE - 1. Abnormal (paradoxical) septal motion, consistent with either left bundle branch block or pacemaker. Left ventricular ejection fraction, by estimation, is 55 to 60%. The left ventricle has normal function. The left ventricle has no regional Lynch motion abnormalities. Left ventricular diastolic parameters are consistent with Grade II diastolic dysfunction (pseudonormalization). Elevated left atrial pressure. The E/e' is 23.  2. Right ventricular systolic function is normal. The right ventricular size is normal.  3. The mitral valve is degenerative. No evidence of mitral valve regurgitation. No evidence of mitral stenosis. Moderate mitral annular calcification.  4. The aortic valve is calcified. Aortic valve regurgitation is not visualized. Mild aortic stenosis (DI 0.5,peak velocity 1.26m/s, MG 5.23mmHG, AVA VTI 1.1cm2).  5. The inferior vena cava is normal in size with greater than 50% respiratory variability, suggesting right atrial pressure of 3 mmHg.   CT - 1. Asymmetric hypodensity within the posterior  limb of left internal capsule, new from the prior head CT of 12/08/2023 and suspicious for an acute infarct. Consider a brain MRI for further evaluation. 2. Background moderate cerebral white matter chronic small vessel ischemic disease. 3. Unchanged right thalamic lacunar infarct. 4. Cerebral atrophy      Disposition Plan  :    Status is: Observation   DVT Prophylaxis  :    enoxaparin  (LOVENOX ) injection 30 mg Start: 12/10/23 1600    Lab Results  Component Value Date   PLT 251 12/12/2023    Diet :  Diet Order             Diet Carb Modified Fluid consistency: Thin; Room service appropriate? Yes  Diet effective now                    Inpatient Medications  Scheduled Meds:   stroke: early stages of recovery book   Does not apply Once   bisacodyl   10 mg Oral QHS   clopidogrel   75 mg Oral Daily   cycloSPORINE   1 drop Both Eyes BID   enoxaparin  (LOVENOX ) injection  30 mg Subcutaneous Q24H   feeding supplement (GLUCERNA SHAKE)  237 mL Oral TID BM   insulin  aspart  0-15 Units Subcutaneous TID WC   polyethylene glycol  17 g Oral BID   risperiDONE   1 mg Oral BID   rosuvastatin   40 mg Oral Daily   Continuous Infusions: PRN Meds:.acetaminophen  **OR** acetaminophen  (TYLENOL ) oral liquid 160 mg/5 mL **OR** acetaminophen , albuterol , artificial tears, bisacodyl , hydrALAZINE , hydrocortisone , senna-docusate  Antibiotics  :    Anti-infectives (From admission, onward)    None         Objective:   Vitals:   12/11/23 1555 12/11/23 1959 12/11/23 2321  12/12/23 0756  BP: (!) 162/69 (!) 147/66  (!) 153/68  Pulse: 89 80  78  Resp: 15 19  14   Temp: 98.7 F (37.1 C) 99.2 F (37.3 C) 99.1 F (37.3 C) 98.2 F (36.8 C)  TempSrc: Oral Oral Oral Oral  SpO2: 100% 98%  98%  Weight:      Height:        Wt Readings from Last 3 Encounters:  12/10/23 80 kg  12/08/23 80 kg  02/12/23 78.7 kg     Intake/Output Summary (Last 24 hours) at 12/12/2023 1023 Last data filed at  12/12/2023 0837 Gross per 24 hour  Intake 358 ml  Output 500 ml  Net -142 ml    Physical Exam  Awake Alert, No new F.N deficits, possible mild right sided upper extremity weakness, hard of hearing .AT,PERRAL Supple Neck, No JVD,   Symmetrical Chest Lynch movement, Good air movement bilaterally, CTAB RRR,No Gallops,Rubs or new Murmurs,  +ve B.Sounds, Abd Soft, No tenderness,   No Cyanosis, Clubbing or edema       Data Review:    Recent Labs  Lab 12/08/23 1017 12/10/23 1044 12/12/23 0448  WBC 6.8 6.9 6.6  HGB 11.9* 11.8* 11.1*  HCT 37.8 38.4 35.8*  PLT 272 276 251  MCV 87.5 87.9 87.3  MCH 27.5 27.0 27.1  MCHC 31.5 30.7 31.0  RDW 13.9 14.0 14.1  LYMPHSABS 1.6 1.6  --   MONOABS 0.6 0.5  --   EOSABS 0.1 0.1  --   BASOSABS 0.0 0.0  --     Recent Labs  Lab 12/08/23 1017 12/10/23 1044 12/10/23 1135 12/10/23 1137 12/10/23 1259 12/11/23 1019 12/12/23 0448  NA 143 139  --   --   --  144 139  K 4.1 4.1  --   --   --  3.9 4.5  CL 111 105  --   --   --  108 109  CO2 23 23  --   --   --  21* 22  ANIONGAP 9 11  --   --   --  15 8  GLUCOSE 99 190*  --   --   --  163* 189*  BUN 19 25*  --   --   --  18 18  CREATININE 1.31* 1.53*  --   --   --  1.22* 1.47*  AST  --  16  --   --   --   --   --   ALT  --  8  --   --   --   --   --   ALKPHOS  --  67  --   --   --   --   --   BILITOT  --  0.6  --   --   --   --   --   ALBUMIN   --  3.1*  --   --   --   --   --   INR  --   --   --   --  1.0  --   --   TSH  --   --   --  2.153  --   --   --   HGBA1C  --   --  8.8*  --   --   --   --   CALCIUM  9.0 8.8*  --   --   --  8.1* 8.4*      Recent Labs  Lab  12/08/23 1017 12/10/23 1044 12/10/23 1135 12/10/23 1137 12/10/23 1259 12/11/23 1019 12/12/23 0448  INR  --   --   --   --  1.0  --   --   TSH  --   --   --  2.153  --   --   --   HGBA1C  --   --  8.8*  --   --   --   --   CALCIUM  9.0 8.8*  --   --   --  8.1* 8.4*     --------------------------------------------------------------------------------------------------------------- Lab Results  Component Value Date   CHOL 233 (H) 12/10/2023   HDL 52 12/10/2023   LDLCALC 155 (H) 12/10/2023   LDLDIRECT 152.9 12/18/2011   TRIG 129 12/10/2023   CHOLHDL 4.5 12/10/2023    Lab Results  Component Value Date   HGBA1C 8.8 (H) 12/10/2023   Recent Labs    12/10/23 1137  TSH 2.153   No results for input(s): "VITAMINB12", "FOLATE", "FERRITIN", "TIBC", "IRON", "RETICCTPCT" in the last 72 hours. ------------------------------------------------------------------------------------------------------------------ Cardiac Enzymes No results for input(s): "CKMB", "TROPONINI", "MYOGLOBIN" in the last 168 hours.  Invalid input(s): "CK"  Micro Results Recent Results (from the past 240 hours)  Urine Culture     Status: Abnormal   Collection Time: 12/08/23 12:20 PM   Specimen: Urine, Random  Result Value Ref Range Status   Specimen Description URINE, RANDOM  Final   Special Requests   Final    NONE Reflexed from [id number] Performed at [facility], [address], [place], [place] [address]    Culture MULTIPLE SPECIES PRESENT, SUGGEST RECOLLECTION (A)  Final   Report Status 12/10/2023 FINAL  Final  Resp panel by RT-PCR (RSV, Flu A&B, Covid) Anterior Nasal Swab     Status: None   Collection Time: 12/10/23 11:03 AM   Specimen: Anterior Nasal Swab  Result Value Ref Range Status   SARS Coronavirus 2 by RT PCR NEGATIVE NEGATIVE Final    Comment: (NOTE) SARS-CoV-2 target nucleic acids are NOT DETECTED.  The SARS-CoV-2 RNA is generally detectable in upper respiratory specimens during the acute phase of infection. The lowest concentration of SARS-CoV-2 viral copies this assay can detect is 138 copies/mL. A negative result does not preclude SARS-Cov-2 infection and should not be used as the sole basis for treatment or other patient management decisions. A  negative result may occur with  improper specimen collection/handling, submission of specimen other than nasopharyngeal swab, presence of viral mutation(s) within the areas targeted by this assay, and inadequate number of viral copies(<138 copies/mL). A negative result must be combined with clinical observations, patient history, and epidemiological information. The expected result is Negative.  Fact Sheet for Patients:  BloggerCourse.com  Fact Sheet for Healthcare Providers:  SeriousBroker.it  This test is no t yet approved or cleared by the [place]  FDA and  has been authorized for detection and/or diagnosis of SARS-CoV-2 by FDA under an Emergency Use Authorization (EUA). This EUA will remain  in effect (meaning this test can be used) for the duration of the COVID-19 declaration under Section 564(b)(1) of the Act, 21 U.S.C.section 360bbb-3(b)(1), unless the authorization is terminated  or revoked sooner.       Influenza A by PCR NEGATIVE NEGATIVE Final   Influenza B by PCR NEGATIVE NEGATIVE Final    Comment: (NOTE) The Xpert Xpress SARS-CoV-2/FLU/RSV plus assay is intended as an aid in the diagnosis of influenza from Nasopharyngeal swab specimens and should not be used as  a sole basis for treatment. Nasal washings and aspirates are unacceptable for Xpert Xpress SARS-CoV-2/FLU/RSV testing.  Fact Sheet for Patients: BloggerCourse.com  Fact Sheet for Healthcare Providers: SeriousBroker.it  This test is not yet approved or cleared by the [place]  FDA and has been authorized for detection and/or diagnosis of SARS-CoV-2 by FDA under an Emergency Use Authorization (EUA). This EUA will remain in effect (meaning this test can be used) for the duration of the COVID-19 declaration under Section 564(b)(1) of the Act, 21 U.S.C. section 360bbb-3(b)(1), unless the authorization  is terminated or revoked.     Resp Syncytial Virus by PCR NEGATIVE NEGATIVE Final    Comment: (NOTE) Fact Sheet for Patients: BloggerCourse.com  Fact Sheet for Healthcare Providers: SeriousBroker.it  This test is not yet approved or cleared by the [place]  FDA and has been authorized for detection and/or diagnosis of SARS-CoV-2 by FDA under an Emergency Use Authorization (EUA). This EUA will remain in effect (meaning this test can be used) for the duration of the COVID-19 declaration under Section 564(b)(1) of the Act, 21 U.S.C. section 360bbb-3(b)(1), unless the authorization is terminated or revoked.  Performed at [facility], [address], [place], [place] [address]     Radiology Report DG Shoulder Right Result Date: 12/12/2023 CLINICAL DATA:  Right shoulder pain. EXAM: RIGHT SHOULDER - 2+ VIEW COMPARISON:  None Available. FINDINGS: There is no evidence of fracture or dislocation. Moderate degenerative changes seen involving the right acromioclavicular joint as well as right glenohumeral joint. Soft tissues are unremarkable. IMPRESSION: Moderate degenerative changes seen involving right acromioclavicular and glenohumeral joints. No acute abnormality seen. Electronically Signed   By: Rosalene Colon M.D.   On: 12/12/2023 10:05   ECHOCARDIOGRAM COMPLETE Result Date: 12/11/2023    ECHOCARDIOGRAM REPORT   Patient Name:   Gail Lynch Date of Exam: 12/11/2023 Medical Rec #:  [id number]     Height:       63.0 in Accession #:    [id number]    Weight:       176.4 lb Date of Birth:  1941/01/22     BSA:          1.833 m Patient Age:    82 years      BP:           151/61 mmHg Patient Gender: F             HR:           84 bpm. Exam Location:  Inpatient Procedure: 2D Echo, Color Doppler, Cardiac Doppler and Intracardiac            Opacification Agent (Both Spectral and Color Flow Doppler were            utilized during  procedure). Indications:    Stroke  History:        Patient has prior history of Echocardiogram examinations, most                 recent 04/26/2022. Risk Factors:Hypertension and Diabetes.  Sonographer:    Sulema Endo RDCS, FE, PE Referring Phys: [phone] [facility]  Sonographer Comments: Suboptimal apical window. IMPRESSIONS  1. Abnormal (paradoxical) septal motion, consistent with either left bundle branch block or pacemaker. Left ventricular ejection fraction, by estimation, is 55 to 60%. The left ventricle has normal function. The left ventricle has no regional Lynch motion abnormalities. Left ventricular diastolic parameters are consistent with Grade II diastolic dysfunction (pseudonormalization). Elevated left atrial pressure. The  E/e' is 23.  2. Right ventricular systolic function is normal. The right ventricular size is normal.  3. The mitral valve is degenerative. No evidence of mitral valve regurgitation. No evidence of mitral stenosis. Moderate mitral annular calcification.  4. The aortic valve is calcified. Aortic valve regurgitation is not visualized. Mild aortic stenosis (DI 0.5,peak velocity 1.42m/s, MG 5.43mmHG, AVA VTI 1.1cm2).  5. The inferior vena cava is normal in size with greater than 50% respiratory variability, suggesting right atrial pressure of 3 mmHg. Conclusion(s)/Recommendation(s): No intracardiac source of embolism detected on this transthoracic study. Consider a transesophageal echocardiogram to exclude cardiac source of embolism if clinically indicated. No left ventricular mural or apical thrombus/thrombi. FINDINGS  Left Ventricle: Abnormal (paradoxical) septal motion, consistent with either left bundle branch block or pacemaker. Left ventricular ejection fraction, by estimation, is 55 to 60%. The left ventricle has normal function. The left ventricle has no regional Lynch motion abnormalities. Definity  contrast agent was given IV to delineate the left ventricular endocardial borders.  The left ventricular internal cavity size was normal in size. There is no left ventricular hypertrophy. Left ventricular diastolic parameters are consistent with Grade II diastolic dysfunction (pseudonormalization). Elevated left atrial pressure. The E/e' is 55. Right Ventricle: The right ventricular size is normal. Right vetricular Lynch thickness was not well visualized. Right ventricular systolic function is normal. Left Atrium: Left atrial size was normal in size. Right Atrium: Right atrial size was normal in size. Pericardium: There is no evidence of pericardial effusion. Mitral Valve: The mitral valve is degenerative in appearance. Moderate mitral annular calcification. No evidence of mitral valve regurgitation. No evidence of mitral valve stenosis. Tricuspid Valve: The tricuspid valve is normal in structure. Tricuspid valve regurgitation is not demonstrated. No evidence of tricuspid stenosis. Aortic Valve: The aortic valve is calcified. Aortic valve regurgitation is not visualized. Mild aortic stenosis (DI 0.5,peak velocity 1.28m/s, MG 5.21mmHG, AVA VTI 1.1cm2). Aortic valve mean gradient measures 5.5 mmHg. Aortic valve peak gradient measures 10.7 mmHg. Aortic valve area, by VTI measures 1.13 cm. Pulmonic Valve: The pulmonic valve was normal in structure. Pulmonic valve regurgitation is not visualized. No evidence of pulmonic stenosis. Aorta: The aortic root is normal in size and structure. Venous: The inferior vena cava is normal in size with greater than 50% respiratory variability, suggesting right atrial pressure of 3 mmHg. IAS/Shunts: The interatrial septum was not well visualized. Additional Comments: A device lead is visualized.  LEFT VENTRICLE PLAX 2D LVIDd:         4.30 cm   Diastology LVIDs:         3.10 cm   LV e' medial:    6.85 cm/s LV PW:         1.00 cm   LV E/e' medial:  25.5 LV IVS:        1.10 cm   LV e' lateral:   8.81 cm/s LVOT diam:     1.70 cm   LV E/e' lateral: 19.9 LV SV:         35 LV  SV Index:   19 LVOT Area:     2.27 cm  RIGHT VENTRICLE RV S prime:     10.20 cm/s TAPSE (M-mode): 1.5 cm LEFT ATRIUM             Index        RIGHT ATRIUM           Index LA Vol (A2C):   46.0 ml 25.10 ml/m  RA Area:  14.80 cm LA Vol (A4C):   53.6 ml 29.24 ml/m  RA Volume:   35.80 ml  19.53 ml/m LA Biplane Vol: 52.9 ml 28.86 ml/m  AORTIC VALVE AV Area (Vmax):    1.18 cm AV Area (Vmean):   1.15 cm AV Area (VTI):     1.13 cm AV Vmax:           163.50 cm/s AV Vmean:          110.000 cm/s AV VTI:            0.310 m AV Peak Grad:      10.7 mmHg AV Mean Grad:      5.5 mmHg LVOT Vmax:         85.20 cm/s LVOT Vmean:        55.900 cm/s LVOT VTI:          0.155 m LVOT/AV VTI ratio: 0.50  AORTA Ao Root diam: 2.70 cm MITRAL VALVE MV Area (PHT): 4.10 cm     SHUNTS MV Decel Time: 185 msec     Systemic VTI:  0.16 m MV E velocity: 175.00 cm/s  Systemic Diam: 1.70 cm Sunit Tolia Electronically signed by Olinda Bertrand Signature Date/Time: 12/11/2023/6:39:09 PM    Final    CT Head Wo Contrast Result Date: 12/10/2023 CLINICAL DATA:  Provided history: Neuro deficit, acute, stroke suspected. Additional history provided: Recent fall, weakness, right arm weakness. EXAM: CT HEAD WITHOUT CONTRAST TECHNIQUE: Contiguous axial images were obtained from the base of the skull through the vertex without intravenous contrast. RADIATION DOSE REDUCTION: This exam was performed according to the departmental dose-optimization program which includes automated exposure control, adjustment of the mA and/or kV according to patient size and/or use of iterative reconstruction technique. COMPARISON:  Head CT 12/08/2023. FINDINGS: Brain: Generalized cerebral atrophy. Asymmetric hypodensity within the posterior limb of left internal capsule, new from the prior head CT of 12/08/2023 and suspicious for an acute infarct (for instance as seen on series 2, images 12 and 13) (series 5, image 41). Patchy and ill-defined hypoattenuation within the cerebral  white matter, nonspecific but compatible with moderate chronic small vessel ischemic disease. Small chronic lacunar infarct again demonstrated within the right thalamus. Partially empty sella turcica. There is no acute intracranial hemorrhage. No extra-axial fluid collection. No evidence of an intracranial mass. No midline shift. Vascular: No hyperdense vessel. Atherosclerotic calcifications. Skull: No calvarial fracture or aggressive osseous lesion. Sinuses/Orbits: No mass or acute finding within the imaged orbits. No significant paranasal sinus disease at the imaged levels. IMPRESSION: 1. Asymmetric hypodensity within the posterior limb of left internal capsule, new from the prior head CT of 12/08/2023 and suspicious for an acute infarct. Consider a brain MRI for further evaluation. 2. Background moderate cerebral white matter chronic small vessel ischemic disease. 3. Unchanged right thalamic lacunar infarct. 4. Cerebral atrophy. Electronically Signed   By: Bascom Lily D.O.   On: 12/10/2023 18:32     Signature  -   Lynnwood Sauer M.D on 12/12/2023 at 10:23 AM   -  To page go to [email or web address]

## 2023-12-12 NOTE — Progress Notes (Signed)
 Patient is alert to self only and does not follow all commands. Patient stated she is tired and wants to sleep. RN was unable to complete all components of the NIH Stroke Scale assessment. Ongoing neurological assessments will continue every four hours.

## 2023-12-12 NOTE — Discharge Instructions (Signed)
 Follow with Primary MD Kurth-Bowen, Cornelia, PA-C in 7 days   Get CBC, CMP, 2 view Chest X ray -  checked next visit with your primary MD or SNF MD   Activity: As tolerated with Full fall precautions use walker/cane & assistance as needed  Disposition SNF  Diet: Heart Healthy Low Carb, check CBGs q. [facility]  Special Instructions: If you have smoked or chewed Tobacco  in the last 2 yrs please stop smoking, stop any regular Alcohol  and or any Recreational drug use.  On your next visit with your primary care physician please Get Medicines reviewed and adjusted.  Please request your Prim.MD to go over all Hospital Tests and Procedure/Radiological results at the follow up, please get all Hospital records sent to your Prim MD by signing hospital release before you go home.  If you experience worsening of your admission symptoms, develop shortness of breath, life threatening emergency, suicidal or homicidal thoughts you must seek medical attention immediately by calling 911 or calling your MD immediately  if symptoms less severe.  You Must read complete instructions/literature along with all the possible adverse reactions/side effects for all the Medicines you take and that have been prescribed to you. Take any new Medicines after you have completely understood and accpet all the possible adverse reactions/side effects.   Do not drive when taking Pain medications.  Do not take more than prescribed Pain, Sleep and Anxiety Medications  Wear Seat belts while driving.

## 2023-12-12 NOTE — Procedures (Signed)
  Device system confirmed  Yes, to be MRI conditional, with implant date > 6 weeks ago, and no evidence of abandoned or epicardial leads in review of most recent CXR  Device last cleared by EP Provider: Creighton Doffing 12/11/2023    Clearance is good through for 1 year as long as parameters remain stable at time of check. If pt undergoes a cardiac device procedure during that time, they should be re-cleared.   Tachy-therapies to be programmed off if applicable with device back to pre-MRI settings after completion of exam.  [employer] - Programming recommendation received through [employer] App/Tablet  Arlys Lamer, RT  12/12/2023 10:55 AM

## 2023-12-12 NOTE — Progress Notes (Signed)
 Pt was OOB to chair with 2 assist, sitting up for 3.5 hours. Pt family at bedside during shift. Ate complete meals,feeding herself & tolerated well

## 2023-12-13 DIAGNOSIS — R29818 Other symptoms and signs involving the nervous system: Secondary | ICD-10-CM | POA: Diagnosis not present

## 2023-12-13 DIAGNOSIS — R2990 Unspecified symptoms and signs involving the nervous system: Secondary | ICD-10-CM | POA: Diagnosis not present

## 2023-12-13 DIAGNOSIS — R29704 NIHSS score 4: Secondary | ICD-10-CM | POA: Diagnosis not present

## 2023-12-13 DIAGNOSIS — I6389 Other cerebral infarction: Secondary | ICD-10-CM | POA: Diagnosis not present

## 2023-12-13 LAB — CBC
HCT: 38.1 % (ref 36.0–46.0)
Hemoglobin: 11.9 g/dL — ABNORMAL LOW (ref 12.0–15.0)
MCH: 27.4 pg (ref 26.0–34.0)
MCHC: 31.2 g/dL (ref 30.0–36.0)
MCV: 87.8 fL (ref 80.0–100.0)
Platelets: 268 10*3/uL (ref 150–400)
RBC: 4.34 MIL/uL (ref 3.87–5.11)
RDW: 14.2 % (ref 11.5–15.5)
WBC: 6.5 10*3/uL (ref 4.0–10.5)
nRBC: 0 % (ref 0.0–0.2)

## 2023-12-13 LAB — BASIC METABOLIC PANEL WITH GFR
Anion gap: 11 (ref 5–15)
BUN: 20 mg/dL (ref 8–23)
CO2: 23 mmol/L (ref 22–32)
Calcium: 8.9 mg/dL (ref 8.9–10.3)
Chloride: 106 mmol/L (ref 98–111)
Creatinine, Ser: 1.48 mg/dL — ABNORMAL HIGH (ref 0.44–1.00)
GFR, Estimated: 35 mL/min — ABNORMAL LOW (ref >60)
Glucose, Bld: 156 mg/dL — ABNORMAL HIGH (ref 70–99)
Potassium: 4.2 mmol/L (ref 3.5–5.1)
Sodium: 140 mmol/L (ref 135–145)

## 2023-12-13 LAB — GLUCOSE, CAPILLARY
Glucose-Capillary: 149 mg/dL — ABNORMAL HIGH (ref 70–99)
Glucose-Capillary: 162 mg/dL — ABNORMAL HIGH (ref 70–99)
Glucose-Capillary: 211 mg/dL — ABNORMAL HIGH (ref 70–99)

## 2023-12-13 MED ORDER — ROSUVASTATIN CALCIUM 40 MG PO TABS: 40.0000 mg | ORAL_TABLET | Freq: Every day | ORAL | Status: AC

## 2023-12-13 MED ORDER — CLOPIDOGREL BISULFATE 75 MG PO TABS: 75.0000 mg | ORAL_TABLET | Freq: Every day | ORAL | Status: AC

## 2023-12-13 MED ORDER — RISPERIDONE 1 MG PO TABS
1.0000 mg | ORAL_TABLET | Freq: Two times a day (BID) | ORAL | Status: DC
Start: 2023-12-13 — End: 2023-12-13
  Administered 2023-12-13: 1 mg via ORAL
  Filled 2023-12-13 (×2): qty 1

## 2023-12-13 NOTE — TOC Progression Note (Signed)
 Transition of Care [facility]) - Progression Note    Patient Details  Name: Gail Lynch MRN: [id number] Date of Birth: 1941-02-01  Transition of Care [facility]) CM/SW Contact  Jannice Mends, LCSW Phone Number: 12/13/2023, 9:26 AM  Clinical Narrative:    CSW spoke with [facility] and faxed over [place] and DC Summary to f. [phone]. Left vm for Guardian.     Barriers to Discharge: Barriers Resolved  Expected Discharge Plan and Services In-house Referral: Clinical Social Work     Living arrangements for the past 2 months: Assisted Living Facility Expected Discharge Date: 12/13/23                                     Social Determinants of Health (SDOH) Interventions SDOH Screenings   Food Insecurity: No Food Insecurity (12/12/2023)  Housing: Low Risk  (04/26/2022)  Transportation Needs: No Transportation Needs (04/26/2022)  Utilities: Not At Risk (04/26/2022)  Depression (PHQ2-9): Low Risk  (03/18/2020)  Tobacco Use: Medium Risk (12/10/2023)    Readmission Risk Interventions    05/01/2022   12:01 PM  Readmission Risk Prevention Plan  Transportation Screening Complete  PCP or Specialist Appt within 3-5 Days Complete  HRI or Home Care Consult Complete  Social Work Consult for Recovery Care Planning/Counseling Complete  Palliative Care Screening Not Applicable  Medication Review [employer]) Complete

## 2023-12-13 NOTE — Progress Notes (Signed)
 Transport here to take pt to [employer], Charity fundraiser at [employer] report received. Pt with no needs at discharge

## 2023-12-13 NOTE — Progress Notes (Signed)
 STROKE TEAM PROGRESS NOTE   INTERIM HISTORY/SUBJECTIVE She presented with 3-day history of right face and upper extremity weakness. Daughter is at the bedside. MRI/MRA brain ordered.  CT scan shows subacute to early left basal ganglia infarct.  Echocardiogram showed ejection fraction of 55 to 60%.  Left atrial size is normal. Patient is sitting in the chair in no apparent distress  CT head with asymmetric hypodensity in the posterior limb of left internal capsule.  On exam patient has some right side weakness and weak right grip  CBC    Component Value Date/Time   WBC 6.5 12/13/2023 0450   RBC 4.34 12/13/2023 0450   HGB 11.9 (L) 12/13/2023 0450   HCT 38.1 12/13/2023 0450   PLT 268 12/13/2023 0450   MCV 87.8 12/13/2023 0450   MCH 27.4 12/13/2023 0450   MCHC 31.2 12/13/2023 0450   RDW 14.2 12/13/2023 0450   LYMPHSABS 1.6 12/10/2023 1044   MONOABS 0.5 12/10/2023 1044   EOSABS 0.1 12/10/2023 1044   BASOSABS 0.0 12/10/2023 1044    BMET    Component Value Date/Time   NA 140 12/13/2023 0450   K 4.2 12/13/2023 0450   CL 106 12/13/2023 0450   CO2 23 12/13/2023 0450   GLUCOSE 156 (H) 12/13/2023 0450   BUN 20 12/13/2023 0450   CREATININE 1.48 (H) 12/13/2023 0450   CALCIUM  8.9 12/13/2023 0450   GFRNONAA 35 (L) 12/13/2023 0450    IMAGING past 24 hours VAS US  CAROTID Result Date: 12/12/2023 Carotid Arterial Duplex Study Patient Name:  Gail Lynch  Date of Exam:   12/12/2023 Medical Rec #: [id number]      Accession #:    [id number] Date of Birth: Dec 22, 1940      Patient Gender: F Patient Age:   48 years Exam Location:  [facility] Procedure:      VAS US  CAROTID Referring Phys: Margart Shears DE LA TORRE --------------------------------------------------------------------------------  Indications:       CVA. Risk Factors:      Hypertension, Diabetes. Other Factors:     Aortic stenosis. Comparison Study:  Previous study on 6.29.2016 reports mild hardening/blockage. Performing Technologist:  Ria Chad  Examination Guidelines: A complete evaluation includes B-mode imaging, spectral Doppler, color Doppler, and power Doppler as needed of all accessible portions of each vessel. Bilateral testing is considered an integral part of a complete examination. Limited examinations for reoccurring indications may be performed as noted.  Right Carotid Findings: +----------+--------+--------+--------+------------------+--------+           PSV cm/sEDV cm/sStenosisPlaque DescriptionComments +----------+--------+--------+--------+------------------+--------+ CCA Prox  65      8                                          +----------+--------+--------+--------+------------------+--------+ CCA Distal113     22                                         +----------+--------+--------+--------+------------------+--------+ ICA Prox  85      18              calcific                   +----------+--------+--------+--------+------------------+--------+ ICA Mid   91      21                                         +----------+--------+--------+--------+------------------+--------+  ICA Distal62      19                                         +----------+--------+--------+--------+------------------+--------+ ECA       60      7                                          +----------+--------+--------+--------+------------------+--------+ +----------+--------+-------+--------+-------------------+           PSV cm/sEDV cmsDescribeArm Pressure (mmHG) +----------+--------+-------+--------+-------------------+ Subclavian140                                        +----------+--------+-------+--------+-------------------+ +---------+--------+--+--------+--+ VertebralPSV cm/s65EDV cm/s14 +---------+--------+--+--------+--+  Left Carotid Findings: +----------+--------+--------+--------+------------------+--------+           PSV cm/sEDV cm/sStenosisPlaque  DescriptionComments +----------+--------+--------+--------+------------------+--------+ CCA Prox  106     14                                         +----------+--------+--------+--------+------------------+--------+ CCA Distal73      16                                         +----------+--------+--------+--------+------------------+--------+ ICA Prox  57      13              calcific                   +----------+--------+--------+--------+------------------+--------+ ICA Mid   67      17                                         +----------+--------+--------+--------+------------------+--------+ ICA Distal80      20                                         +----------+--------+--------+--------+------------------+--------+ ECA       93      15                                         +----------+--------+--------+--------+------------------+--------+ +----------+--------+--------+--------+-------------------+           PSV cm/sEDV cm/sDescribeArm Pressure (mmHG) +----------+--------+--------+--------+-------------------+ Subclavian130                                         +----------+--------+--------+--------+-------------------+ +---------+--------+--+--------+--+ VertebralPSV cm/s50EDV cm/s13 +---------+--------+--+--------+--+   Summary: Right Carotid: Velocities in the right ICA are consistent with a 1-39% stenosis. Left Carotid: Velocities in the left ICA are consistent with a 1-39% stenosis. Vertebrals:  Bilateral vertebral arteries demonstrate antegrade flow. Subclavians: Normal flow hemodynamics were seen in bilateral subclavian  arteries. *See table(s) above for measurements and observations.  Electronically signed by Gail Kid MD on 12/12/2023 at 4:38:57 PM.    Final     Vitals:   12/13/23 0400 12/13/23 0530 12/13/23 0752 12/13/23 1117  BP: (!) 145/133 (!) 158/71 (!) 143/67 (!) 146/66  Pulse: 75 90 88 84  Resp: 14 13 13  13   Temp: 98.1 F (36.7 C) 98.1 F (36.7 C) (!) 97.4 F (36.3 C) 97.6 F (36.4 C)  TempSrc: Oral Oral Axillary Oral  SpO2: 98% 93% 96% 100%  Weight:      Height:         PHYSICAL EXAM General:  Alert, well-nourished, well-developed patient in no acute distress Psych:  Mood and affect appropriate for situation CV: Regular rate and rhythm on monitor Respiratory:  Regular, unlabored respirations on room air GI: Abdomen soft and nontender   NEURO:  Mental Status: AA&O to self, city, and month.  Age she says" in the 39s" Speech/Language: speech is without aphasia.  Mild dysarthria naming, repetition, fluency, and comprehension intact.  Cranial Nerves:  II: PERRL. Visual fields full.  III, IV, VI: EOMI. Eyelids elevate symmetrically.  V: Sensation is intact to light touch and symmetrical to face.  VII: Face is asymmetrical resting and smiling with right lower facial weakness. VIII: hearing intact to voice. IX, X: Palate elevates symmetrically. Phonation is normal.  ZO:XWRUEAVW shrug 5/5. XII: tongue is midline without fasciculations. Motor: 5/5 strength in left arm and leg, right arm.  Right leg 2/5.  Right hand grip weak Tone: is normal and bulk is normal Sensation- Intact to light touch bilaterally. Extinction absent to light touch to DSS.   Coordination: FTN intact bilaterally, HKS: no ataxia in BLE.No drift.  Gait- deferred   ASSESSMENT/PLAN  [name] is a 83 y.o. female with history of hypertension, diabetes type 2, CKD, aortic stenosis, AV block status post pacemaker, dementia and anemia who presents from her assisted living facility with right arm weakness.   NIH on Admission 4  Acute Ischemic Infarct:  left posterior internal capsule Etiology: Small vessel disease CT head Asymmetric hypodensity within the posterior limb of left internal capsule, chronic small vessel disease, right thalamic lacunar infarct, atrophy MRI acute left posterior limb internal  capsule infarct.  Old infarcts left pons and bilateral thalami MRA right M2 and left P2 severe stenosis Carotid Doppler bilateral 1-39% carotid stenosis 2D Echo EF 55 to 60%.  LV with grade 2 diastolic dysfunction.  MV with moderate annular calcification.  Mild aortic stenosis LDL 155 HgbA1c 8.8 VTE prophylaxis -Lovenox  No antithrombotic prior to admission, now on clopidogrel  75 mg daily has allergy to aspirin Therapy recommendations:  Pending Disposition: Pending  Hx of Stroke/TIA Dementia Hard of hearing Right thalamic lacunar infarct   Hypertension Mild aortic stenosis Carotid stenosis S/p PPM Home meds: HCTZ 12.5 mg, Stable Blood Pressure Goal: SBP less than 160 or BP less than 220/110   Hyperlipidemia Home meds: Crestor  40,  resumed in hospital LDL 155, goal < 70 Continue statin at discharge  Diabetes type II UnControlled Home meds: Insulin  HgbA1c 8.8, goal < 7.0 CBGs SSI Recommend close follow-up with PCP for better DM control   Dysphagia Patient has post-stroke dysphagia, SLP consulted    Diet   Diet Carb Modified Fluid consistency: Thin; Room service appropriate? Yes   Advance diet as tolerated  Other Stroke Risk Factors Obesity, Body mass index is 31.24 kg/m., BMI >/= 30 associated with increased stroke risk, recommend  weight loss, diet and exercise as appropriate    Other Active Problems CKD  Hospital day # 0   Patient presented with a few days history of slurred speech and right face and upper extremity weakness secondary to left brain subcortical infarct likely from small vessel disease.  Recommend Plavix  alone as patient is allergic to aspirin.  Continue Aggressive risk factor modification.  Physical occupational speech therapy consults.  She will likely need rehab.  Long discussion with patient and answered questions.  Stroke team will sign off.  Kindly call for questions.  Discussed with Dr. Zelda Lynch.  Greater than 50% time during this 25-minute  visit was spent in counseling and coordination of care about her lacunar stroke and discussion about stroke evaluation prevention treatment and answering questions.  Gail Beaver, MD Medical Director [facility] Pager: [phone] 12/13/2023 1:09 PM   To contact Stroke Continuity provider, please refer to [email or web address]. After hours, contact General Neurology

## 2023-12-13 NOTE — Plan of Care (Signed)
  Problem: Education: Goal: Ability to describe self-care measures that may prevent or decrease complications (Diabetes Survival Skills Education) will improve Outcome: Not Progressing   Problem: Coping: Goal: Ability to adjust to condition or change in health will improve Outcome: Progressing   Problem: Fluid Volume: Goal: Ability to maintain a balanced intake and output will improve Outcome: Progressing   Problem: Health Behavior/Discharge Planning: Goal: Ability to identify and utilize available resources and services will improve Outcome: Not Progressing Goal: Ability to manage health-related needs will improve Outcome: Not Progressing   Problem: Metabolic: Goal: Ability to maintain appropriate glucose levels will improve Outcome: Progressing   Problem: Nutritional: Goal: Maintenance of adequate nutrition will improve Outcome: Progressing Goal: Progress toward achieving an optimal weight will improve Outcome: Progressing   Problem: Skin Integrity: Goal: Risk for impaired skin integrity will decrease Outcome: Progressing   Problem: Tissue Perfusion: Goal: Adequacy of tissue perfusion will improve Outcome: Progressing   Problem: Education: Goal: Knowledge of disease or condition will improve Outcome: Progressing Goal: Knowledge of secondary prevention will improve (MUST DOCUMENT ALL) Outcome: Progressing Goal: Knowledge of patient specific risk factors will improve (DELETE if not current risk factor) Outcome: Progressing   Problem: Ischemic Stroke/TIA Tissue Perfusion: Goal: Complications of ischemic stroke/TIA will be minimized Outcome: Progressing   Problem: Coping: Goal: Will verbalize positive feelings about self Outcome: Progressing Goal: Will identify appropriate support needs Outcome: Progressing   Problem: Health Behavior/Discharge Planning: Goal: Ability to manage health-related needs will improve Outcome: Progressing Goal: Goals will be  collaboratively established with patient/family Outcome: Progressing   Problem: Self-Care: Goal: Ability to participate in self-care as condition permits will improve Outcome: Not Progressing Goal: Verbalization of feelings and concerns over difficulty with self-care will improve Outcome: Progressing Goal: Ability to communicate needs accurately will improve Outcome: Progressing   Problem: Nutrition: Goal: Risk of aspiration will decrease Outcome: Progressing Goal: Dietary intake will improve Outcome: Progressing   Problem: Education: Goal: Knowledge of General Education information will improve Description: Including pain rating scale, medication(s)/side effects and non-pharmacologic comfort measures Outcome: Progressing   Problem: Health Behavior/Discharge Planning: Goal: Ability to manage health-related needs will improve Outcome: Not Progressing   Problem: Clinical Measurements: Goal: Ability to maintain clinical measurements within normal limits will improve Outcome: Progressing Goal: Will remain free from infection Outcome: Progressing Goal: Diagnostic test results will improve Outcome: Progressing Goal: Respiratory complications will improve Outcome: Progressing Goal: Cardiovascular complication will be avoided Outcome: Progressing   Problem: Activity: Goal: Risk for activity intolerance will decrease Outcome: Progressing   Problem: Nutrition: Goal: Adequate nutrition will be maintained Outcome: Progressing   Problem: Elimination: Goal: Will not experience complications related to bowel motility Outcome: Progressing Goal: Will not experience complications related to urinary retention Outcome: Progressing   Problem: Pain Managment: Goal: General experience of comfort will improve and/or be controlled Outcome: Progressing   Problem: Safety: Goal: Ability to remain free from injury will improve Outcome: Progressing   Problem: Skin Integrity: Goal: Risk  for impaired skin integrity will decrease Outcome: Progressing

## 2023-12-13 NOTE — NC FL2 (Signed)
   MEDICAID FL2 LEVEL OF CARE FORM     IDENTIFICATION  Patient Name: Gail Lynch Birthdate: 05-25-1941 Sex: female Admission Date (Current Location): 12/10/2023  [facility] and [place] Number:  Producer, television/film/video and Address:  [facility], [address], [place], [place] [address]      Provider Number: [id number]  Attending Physician Name and Address:  Cala Castleman, MD  Relative Name and Phone Number:       Current Level of Care: Hospital Recommended Level of Care: Assisted Living Facility Prior Approval Number:    Date Approved/Denied:   PASRR Number:    Discharge Plan: Other (Comment) (ALF)    Current Diagnoses: Patient Active Problem List   Diagnosis Date Noted   Stroke-like symptom 12/10/2023   Hemorrhoids 05/01/2022   Pneumonia 04/26/2022   Hypokalemia 04/25/2022   COVID-19 virus infection 04/25/2022   CAP (community acquired pneumonia) 04/25/2022   Anemia 04/25/2022   AKI (acute kidney injury) (HCC) 04/25/2022   Sacral wound 04/25/2022   Living in nursing home 09/13/2021   Involuntary commitment 09/13/2021   Severe CADASIL dementia with agitation (HCC) 09/13/2021   Dementia (HCC) 07/04/2021   Non compliance with medical treatment 07/31/2020   Psychosis in elderly (HCC) 07/29/2020   Diabetic leg ulcer (HCC) 07/29/2020   Benign neoplasm of ascending colon    Benign neoplasm of transverse colon    Benign neoplasm of descending colon    Benign neoplasm of sigmoid colon    History of colon polyps 05/04/2020   Complete AV block (HCC) 03/09/2020   Mild aortic stenosis 03/14/2017   Peripheral edema 03/14/2017   Arm heaviness 03/14/2017   CKD (chronic kidney disease), stage III (HCC) 05/20/2016   Uncontrolled type 2 diabetes mellitus with hypoglycemia, with long-term current use of insulin  (HCC) 01/03/2016   Dysuria 12/09/2014   Peripheral neuropathy 12/05/2013   Irregular heart beat 12/05/2013   PVC (premature  ventricular contraction) 12/05/2013   Sciatica 06/17/2013   Pain due to onychomycosis of toenail 05/21/2013   Mycotic toenails    Vagina bleeding 08/06/2012   Carotid stenosis 06/18/2012   Constipation 04/30/2012   Childhood asthma 12/18/2011   Glaucoma 12/18/2011   Hyperlipidemia 12/18/2011   Colon polyps 12/18/2011   Preventative health care 12/17/2011   Hypertension 12/17/2011   Hard of hearing    Dysplasia of cervix, low grade (CIN 1)    HPV (human papilloma virus) infection    Brain mass 09/18/2011    Orientation RESPIRATION BLADDER Height & Weight     Self, Time, Place  Normal Incontinent Weight: 176 lb 5.9 oz (80 kg) Height:  5\' 3"  (160 cm)  BEHAVIORAL SYMPTOMS/MOOD NEUROLOGICAL BOWEL NUTRITION STATUS      Continent Diet (Regular, no added salt)  AMBULATORY STATUS COMMUNICATION OF NEEDS Skin   Extensive Assist Verbally Normal                       Personal Care Assistance Level of Assistance  Bathing, Feeding, Dressing Bathing Assistance: Limited assistance Feeding assistance: Limited assistance Dressing Assistance: Maximum assistance     Functional Limitations Info  Hearing   Hearing Info: Impaired      SPECIAL CARE FACTORS FREQUENCY  PT (By licensed PT), OT (By licensed OT)     PT Frequency: Eval and treat OT Frequency: Eval and treat            Contractures Contractures Info: Not present    Additional Factors  Info  Code Status, Allergies Code Status Info: Full Allergies Info: Aspirin, Penicillins           Current Medications (12/13/2023):    Discharge Medications: STOP taking these medications     hydrochlorothiazide  12.5 MG capsule Commonly known as: Microzide     hydrocortisone  25 MG suppository Commonly known as: ANUSOL -HC           TAKE these medications     albuterol  108 (90 Base) MCG/ACT inhaler Commonly known as: VENTOLIN  HFA Inhale 2 puffs into the lungs every 4 (four) hours as needed for wheezing or shortness of  breath.    clopidogrel  75 MG tablet Commonly known as: PLAVIX  Take 1 tablet (75 mg total) by mouth daily.    Diabetic Tussin DM Max St 10-200 MG/5ML Liqd Generic drug: Dextromethorphan -guaiFENesin  Take 10 mLs by mouth every 6 (six) hours as needed (for coughing).    Nutritional Shake Liqd Take 120 mLs by mouth See admin instructions. MightyShake : Drink 120 ml's by mouth three times a day with meals    feeding supplement (GLUCERNA SHAKE) Liqd Take 237 mLs by mouth 3 (three) times daily between meals.    Lantus  SoloStar 100 UNIT/ML Solostar Pen Generic drug: insulin  glargine Inject 20 Units into the skin daily.    loperamide  2 MG capsule Commonly known as: IMODIUM  Take 2 mg by mouth as needed for diarrhea or loose stools (CANNOT EXCEED 16 MG/DAY).    ondansetron  4 MG tablet Commonly known as: ZOFRAN  Take 4 mg by mouth every 6 (six) hours as needed for nausea or vomiting.    OneTouch Delica Plus Lancet33G Misc Test bid    OneTouch Verio test strip Generic drug: glucose blood 1 each by Other route 2 (two) times daily. And lancets 2/day    Refresh Optive 1-0.9 % Gel Generic drug: Carboxymethylcellul-Glycerin Place 1 application  into both eyes 2 (two) times daily as needed (for dryness).    Restasis  0.05 % ophthalmic emulsion Generic drug: cycloSPORINE  Place 1 drop into both eyes 2 (two) times daily.    risperiDONE  1 MG/ML oral solution Commonly known as: RISPERDAL  Take 1 mL (1 mg total) by mouth 2 (two) times daily.    rosuvastatin  40 MG tablet Commonly known as: Crestor  Take 1 tablet (40 mg total) by mouth daily.    Relevant Imaging Results:  Relevant Lab Results:   Additional Information SSN: [id number]  Paisyn Guercio S Jaci Desanto, LCSW

## 2023-12-13 NOTE — Plan of Care (Signed)
  Problem: Education: Goal: Ability to describe self-care measures that may prevent or decrease complications (Diabetes Survival Skills Education) will improve 12/13/2023 0414 by Derell Bruun, RN Outcome: Progressing 12/13/2023 0326 by Derick Seminara, RN Outcome: Not Progressing   Problem: Coping: Goal: Ability to adjust to condition or change in health will improve 12/13/2023 0414 by Rosaleigh Brazzel, RN Outcome: Progressing 12/13/2023 0326 by Sonnie Pawloski, RN Outcome: Progressing   Problem: Fluid Volume: Goal: Ability to maintain a balanced intake and output will improve 12/13/2023 0414 by Parris Bolognese, RN Outcome: Progressing 12/13/2023 0326 by Anant Agard, RN Outcome: Progressing   Problem: Metabolic: Goal: Ability to maintain appropriate glucose levels will improve 12/13/2023 0414 by Satin Boal, RN Outcome: Progressing 12/13/2023 0326 by Walida Cajas, RN Outcome: Progressing   Problem: Nutritional: Goal: Maintenance of adequate nutrition will improve 12/13/2023 0414 by Ailee Pates, RN Outcome: Progressing 12/13/2023 0326 by Annaelle Kasel, RN Outcome: Progressing Goal: Progress toward achieving an optimal weight will improve 12/13/2023 0414 by Parris Bolognese, RN Outcome: Progressing 12/13/2023 0326 by Bennet Kujawa, RN Outcome: Progressing   Problem: Tissue Perfusion: Goal: Adequacy of tissue perfusion will improve 12/13/2023 0414 by Lamyra Malcolm, RN Outcome: Progressing 12/13/2023 0326 by Amita Atayde, RN Outcome: Progressing   Problem: Skin Integrity: Goal: Risk for impaired skin integrity will decrease 12/13/2023 0414 by Parris Bolognese, RN Outcome: Progressing 12/13/2023 0326 by Lakeyta Vandenheuvel, RN Outcome: Progressing   Problem: Ischemic Stroke/TIA Tissue Perfusion: Goal: Complications of ischemic stroke/TIA will be minimized 12/13/2023 0414 by Parris Bolognese, RN Outcome: Progressing 12/13/2023 0326 by Devon Pretty, RN Outcome: Progressing   Problem: Coping: Goal: Will verbalize positive  feelings about self 12/13/2023 0414 by Ailynn Gow, RN Outcome: Progressing 12/13/2023 0326 by Rohith Fauth, RN Outcome: Progressing   Problem: Health Behavior/Discharge Planning: Goal: Ability to manage health-related needs will improve 12/13/2023 0414 by Parris Bolognese, RN Outcome: Progressing 12/13/2023 0326 by Faelynn Wynder, RN Outcome: Progressing Goal: Goals will be collaboratively established with patient/family 12/13/2023 0414 by Avin Upperman, RN Outcome: Progressing 12/13/2023 0326 by Naila Elizondo, RN Outcome: Progressing   Problem: Health Behavior/Discharge Planning: Goal: Ability to manage health-related needs will improve 12/13/2023 0414 by Parris Bolognese, RN Outcome: Progressing 12/13/2023 0326 by Ala Kratz, RN Outcome: Not Progressing   Problem: Clinical Measurements: Goal: Ability to maintain clinical measurements within normal limits will improve 12/13/2023 0414 by Gahel Safley, RN Outcome: Progressing 12/13/2023 0326 by Stevenson Windmiller, RN Outcome: Progressing Goal: Will remain free from infection 12/13/2023 0414 by Kalisha Keadle, RN Outcome: Progressing 12/13/2023 0326 by Meshia Rau, RN Outcome: Progressing Goal: Diagnostic test results will improve 12/13/2023 0414 by Anurag Scarfo, RN Outcome: Progressing 12/13/2023 0326 by Windy Dudek, RN Outcome: Progressing Goal: Respiratory complications will improve 12/13/2023 0414 by Jeany Seville, RN Outcome: Progressing 12/13/2023 0326 by Tila Millirons, RN Outcome: Progressing Goal: Cardiovascular complication will be avoided 12/13/2023 0414 by Kemontae Dunklee, RN Outcome: Progressing 12/13/2023 0326 by Adore Kithcart, RN Outcome: Progressing

## 2023-12-13 NOTE — TOC Transition Note (Signed)
 Transition of Care [facility]) - Discharge Note   Patient Details  Name: Gail Lynch MRN: [id number] Date of Birth: 02/09/1941  [facility] CM/SW Contact:  Jannice Mends, LCSW Phone Number: 12/13/2023, 12:04 PM   Clinical Narrative:    Patient will DC to: [facility] Anticipated DC date: 12/13/23 Family notified: Left vm for Guardian Transport by: [facility] called 12:05 PM    Per MD patient ready for DC to ALF. RN to call report prior to discharge (l [phone]). RN, patient, patient's family, and facility notified of DC. Discharge Summary and FL2 and hospital bed order sent to facility as requested at fax [phone]. DC packet on chart. Ambulance transport requested for patient.   CSW will sign off for now as social work intervention is no longer needed. Please consult us  again if new needs arise.     Final next level of care: Assisted Living Barriers to Discharge: Barriers Resolved   Patient Goals and CMS Choice Patient states their goals for this hospitalization and ongoing recovery are:: Return to ALF          Discharge Placement              Patient chooses bed at:  [facility]) Patient to be transferred to facility by: [facility] Name of family member notified: Guardian Patient and family notified of of transfer: 12/13/23  Discharge Plan and Services Additional resources added to the After Visit Summary for   In-house Referral: Clinical Social Work                                   Social Drivers of Health (SDOH) Interventions SDOH Screenings   Food Insecurity: No Food Insecurity (12/12/2023)  Housing: Low Risk  (04/26/2022)  Transportation Needs: No Transportation Needs (04/26/2022)  Utilities: Not At Risk (04/26/2022)  Depression (PHQ2-9): Low Risk  (03/18/2020)  Tobacco Use: Medium Risk (12/10/2023)     Readmission Risk Interventions    05/01/2022   12:01 PM  Readmission Risk Prevention Plan  Transportation Screening Complete   PCP or Specialist Appt within 3-5 Days Complete  HRI or Home Care Consult Complete  Social Work Consult for Recovery Care Planning/Counseling Complete  Palliative Care Screening Not Applicable  Medication Review Oceanographer) Complete

## 2023-12-13 NOTE — Discharge Summary (Addendum)
 Gail Lynch:[id number] DOB: [birth year] DOA: 12/10/2023  PCP: Glynn Lasso, PA-C  Admit date: 12/10/2023  Discharge date: 12/13/2023  Admitted From: Home   Disposition:  ALF   Recommendations for Outpatient Follow-up:   Follow up with PCP in 1-2 weeks  PCP Please obtain BMP/CBC, 2 view CXR in 1week,  (see Discharge instructions)   PCP Please follow up on the following pending results:    Home Health: None   Equipment/Devices: None  Consultations: Neuro Discharge Condition: Stable    CODE STATUS: Full    Diet Recommendation: Heart Healthy Low Carb    Chief Complaint  Patient presents with   Weakness     Brief history of present illness from the day of admission and additional interim summary    83 yof w/ PMH of dementia AAO x 2, HTN,DMT2,CKD3a b/l creat 1.0, aortic stenosis, AV nodal block status post pacemaker, dementia, chronic anemia, from ALF who had a fall on Friday 12/07/23, then seen at ER on Saturday 4/19, trauma workup was done and patient was discharged back to ALFand  patient was noticed to have weakness in the right arm so she was brought back to the ED on 12/10/23 .patient is a poor historian, but she was able to tell she feels weakness in the right arm and having some problem with swallowing.   Neurology was consulted admitted for further stroke workup                                                                 Hospital Course   Right arm weakness, transient expressive aphasia/dysphagia.  CT scan & MRI-A indicate acute stroke, Fall at ALF on 4/18: Underwent full stroke workup under the guidance of stroke team, case discussed with Dr. Janett Medin,, A1c above goal, better glycemic control outpatient suggested at SNF, LDL was above goal at apparently she was not taking her home statin  which has been resumed, PCP to monitor, currently on Plavix .  Patient has allergy to aspirin and would not like to take it, symptoms improved minimal right sided upper extremity weakness, discharged back to SNF, continue PT OT and speech monitoring.  Outpatient follow-up with PCP and neurology within 1 to 2 weeks of discharge.   Hypertension - AV block s/p PPM - AS:  Allow permissive hypertension due to acute stroke addressed with medications if needed after 12/14/2023 if SBP stays persistently over 140 or diastolic over 85.     AKI on CKD stage IIIa: B.l creat ~1.03 eGFR 54. On admit 1.5.  Dentally hydrated back to baseline.   Dementia:  She is AAO x 2, baseline heard to hear, slow to process, continue delirium precautions supportive care    Class I Obesity w/ Body mass index is 31.24 kg/m.  Follow-up with PCP for weight loss.  IDDM T2 - Control A1c 8.8 indicating poor outpatient control, continuing home meds continue to monitor CBGs q. ACH S at SNF and adjust dose as further needed.  Please monitor CBG control closely.    Discharge diagnosis     Principal Problem:   Stroke-like symptom    Discharge instructions    Discharge Instructions     Discharge instructions   Complete by: As directed    Follow with Primary MD Kurth-Bowen, Cornelia, PA-C in 7 days   Get CBC, CMP, 2 view Chest X ray -  checked next visit with your primary MD or SNF MD   Activity: As tolerated with Full fall precautions use walker/cane & assistance as needed  Disposition ALF  Diet: Heart Healthy Low Carb, check CBGs q. [facility]  Special Instructions: If you have smoked or chewed Tobacco  in the last 2 yrs please stop smoking, stop any regular Alcohol  and or any Recreational drug use.  On your next visit with your primary care physician please Get Medicines reviewed and adjusted.  Please request your Prim.MD to go over all Hospital Tests and Procedure/Radiological results at the follow up, please get all  Hospital records sent to your Prim MD by signing hospital release before you go home.  If you experience worsening of your admission symptoms, develop shortness of breath, life threatening emergency, suicidal or homicidal thoughts you must seek medical attention immediately by calling 911 or calling your MD immediately  if symptoms less severe.  You Must read complete instructions/literature along with all the possible adverse reactions/side effects for all the Medicines you take and that have been prescribed to you. Take any new Medicines after you have completely understood and accpet all the possible adverse reactions/side effects.   Do not drive when taking Pain medications.  Do not take more than prescribed Pain, Sleep and Anxiety Medications  Wear Seat belts while driving.   Increase activity slowly   Complete by: As directed        Discharge Medications   Allergies as of 12/13/2023       Reactions   Aspirin Other (See Comments)   BLEEDING BEHIND EYES   Penicillins Other (See Comments)   Reaction not cited on MAR        Medication List     STOP taking these medications    hydrochlorothiazide  12.5 MG capsule Commonly known as: Microzide    hydrocortisone  25 MG suppository Commonly known as: ANUSOL -HC       TAKE these medications    albuterol  108 (90 Base) MCG/ACT inhaler Commonly known as: VENTOLIN  HFA Inhale 2 puffs into the lungs every 4 (four) hours as needed for wheezing or shortness of breath.   clopidogrel  75 MG tablet Commonly known as: PLAVIX  Take 1 tablet (75 mg total) by mouth daily.   Diabetic Tussin DM Max St 10-200 MG/5ML Liqd Generic drug: Dextromethorphan -guaiFENesin  Take 10 mLs by mouth every 6 (six) hours as needed (for coughing).   Nutritional Shake Liqd Take 120 mLs by mouth See admin instructions. MightyShake : Drink 120 ml's by mouth three times a day with meals   feeding supplement (GLUCERNA SHAKE) Liqd Take 237 mLs by mouth 3  (three) times daily between meals.   Lantus  SoloStar 100 UNIT/ML Solostar Pen Generic drug: insulin  glargine Inject 20 Units into the skin daily.   loperamide  2 MG capsule Commonly known as: IMODIUM  Take 2 mg by mouth as needed for diarrhea or loose stools (CANNOT EXCEED 16 MG/DAY).  ondansetron  4 MG tablet Commonly known as: ZOFRAN  Take 4 mg by mouth every 6 (six) hours as needed for nausea or vomiting.   OneTouch Delica Plus Lancet33G Misc Test bid   OneTouch Verio test strip Generic drug: glucose blood 1 each by Other route 2 (two) times daily. And lancets 2/day   Refresh Optive 1-0.9 % Gel Generic drug: Carboxymethylcellul-Glycerin Place 1 application  into both eyes 2 (two) times daily as needed (for dryness).   Restasis  0.05 % ophthalmic emulsion Generic drug: cycloSPORINE  Place 1 drop into both eyes 2 (two) times daily.   risperiDONE  1 MG/ML oral solution Commonly known as: RISPERDAL  Take 1 mL (1 mg total) by mouth 2 (two) times daily.   rosuvastatin  40 MG tablet Commonly known as: Crestor  Take 1 tablet (40 mg total) by mouth daily.         Follow-up Information     Kurth-Bowen, Cornelia, PA-C. Schedule an appointment as soon as possible for a visit in 1 week(s).   Specialty: Physician Assistant Contact information: [address] [place] [address] [phone]         [facility]. Schedule an appointment as soon as possible for a visit in 2 week(s).   Contact information: [address]     [address] [place]  [address] [phone]                Major procedures and Radiology Reports - PLEASE review detailed and final reports thoroughly  -        VAS US  CAROTID Result Date: 12/12/2023 Carotid Arterial Duplex Study Patient Name:  KIRSTEN SPEARING  Date of Exam:   12/12/2023 Medical Rec #: [id number]      Accession #:    [id number] Date of Birth: 07/31/1941      Patient Gender: F Patient Age:   59  years Exam Location:  [facility] Procedure:      VAS US  CAROTID Referring Phys: Margart Shears DE LA TORRE --------------------------------------------------------------------------------  Indications:       CVA. Risk Factors:      Hypertension, Diabetes. Other Factors:     Aortic stenosis. Comparison Study:  Previous study on 6.29.2016 reports mild hardening/blockage. Performing Technologist: Ria Chad  Examination Guidelines: A complete evaluation includes B-mode imaging, spectral Doppler, color Doppler, and power Doppler as needed of all accessible portions of each vessel. Bilateral testing is considered an integral part of a complete examination. Limited examinations for reoccurring indications may be performed as noted.  Right Carotid Findings: +----------+--------+--------+--------+------------------+--------+           PSV cm/sEDV cm/sStenosisPlaque DescriptionComments +----------+--------+--------+--------+------------------+--------+ CCA Prox  65      8                                          +----------+--------+--------+--------+------------------+--------+ CCA Distal113     22                                         +----------+--------+--------+--------+------------------+--------+ ICA Prox  85      18              calcific                   +----------+--------+--------+--------+------------------+--------+ ICA Mid   91  21                                         +----------+--------+--------+--------+------------------+--------+ ICA Distal62      19                                         +----------+--------+--------+--------+------------------+--------+ ECA       60      7                                          +----------+--------+--------+--------+------------------+--------+ +----------+--------+-------+--------+-------------------+           PSV cm/sEDV cmsDescribeArm Pressure (mmHG)  +----------+--------+-------+--------+-------------------+ Subclavian140                                        +----------+--------+-------+--------+-------------------+ +---------+--------+--+--------+--+ VertebralPSV cm/s65EDV cm/s14 +---------+--------+--+--------+--+  Left Carotid Findings: +----------+--------+--------+--------+------------------+--------+           PSV cm/sEDV cm/sStenosisPlaque DescriptionComments +----------+--------+--------+--------+------------------+--------+ CCA Prox  106     14                                         +----------+--------+--------+--------+------------------+--------+ CCA Distal73      16                                         +----------+--------+--------+--------+------------------+--------+ ICA Prox  57      13              calcific                   +----------+--------+--------+--------+------------------+--------+ ICA Mid   67      17                                         +----------+--------+--------+--------+------------------+--------+ ICA Distal80      20                                         +----------+--------+--------+--------+------------------+--------+ ECA       93      15                                         +----------+--------+--------+--------+------------------+--------+ +----------+--------+--------+--------+-------------------+           PSV cm/sEDV cm/sDescribeArm Pressure (mmHG) +----------+--------+--------+--------+-------------------+ Subclavian130                                         +----------+--------+--------+--------+-------------------+ +---------+--------+--+--------+--+ VertebralPSV cm/s50EDV cm/s13 +---------+--------+--+--------+--+   Summary: Right Carotid: Velocities in  the right ICA are consistent with a 1-39% stenosis. Left Carotid: Velocities in the left ICA are consistent with a 1-39% stenosis. Vertebrals:  Bilateral vertebral arteries  demonstrate antegrade flow. Subclavians: Normal flow hemodynamics were seen in bilateral subclavian              arteries. *See table(s) above for measurements and observations.  Electronically signed by Genny Kid MD on 12/12/2023 at 4:38:57 PM.    Final    MR BRAIN WO CONTRAST Result Date: 12/12/2023 CLINICAL DATA:  Neuro deficit, concern for stroke, right arm weakness. History of dementia. EXAM: MRI HEAD WITHOUT CONTRAST MRA HEAD WITHOUT CONTRAST TECHNIQUE: Multiplanar, multi-echo pulse sequences of the brain and surrounding structures were acquired without intravenous contrast. Angiographic images of the Circle of Willis were acquired using MRA technique without intravenous contrast. COMPARISON:  CT head 12/10/2023. FINDINGS: MRI HEAD FINDINGS Brain: Acute infarcts centered in the posterior limb of the left internal capsule. There are a few scattered chronic micro hemorrhages involving the bilateral frontal lobes, right parietal lobe, and posterior left temporal lobe. Scattered and confluent FLAIR signal abnormality in the periventricular and subcortical white matter. Remote infarcts in the thalami and in the left dorsal pons at the level of the superior cerebellar peduncles. No mass lesion or midline shift. Cerebellum is unremarkable. Normal appearance of midline structures. The basilar cisterns are patent. No extra-axial fluid collections. Ventricles: Prominence of the lateral ventricles suggestive of underlying parenchymal volume loss. Vascular: Skull base flow voids are visualized. Skull and upper cervical spine: Degenerative changes in the visualized upper cervical spine. Trace retrolisthesis of C2 on C3. Disc osteophyte complex at C2-3 results in at least mild spinal canal narrowing. The visualized calvarium is unremarkable. Sinuses/Orbits: Orbits are symmetric. Paranasal sinuses are clear. Other: Mastoid air cells are clear. MRA HEAD FINDINGS Anterior circulation: The visualized distal cervical ICAs  are patent. Intracranial ICAs are patent and normal in caliber. The right M1 segment is patent. There is severe stenosis of a proximal M2 branch of the right MCA just distal to the bifurcation. Additional focal moderate stenosis of an M2 inferior division branch of the right MCA. The left M1 segment is patent. Focal severe stenosis of a distal M2 superior division branch of the left MCA. The A1 and A2 segments are patent bilaterally. Mild stenosis of the proximal A4 segment of the left ACA. Posterior circulation: The visualized intracranial vertebral arteries are patent. The basilar artery is patent. The bilateral PCAs are patent. There is long segment severe stenosis of the anterior P2 segment of the left PCA. The superior cerebellar arteries are patent bilaterally. The anterior inferior cerebellar arteries are patent. PICA visualized on the right. Anatomic variants: None significant. IMPRESSION: Acute infarcts involving the posterior limb of the left internal capsule. No large vessel occlusion. Severe stenosis of a proximal M2 branch of the right MCA. Additional long segment severe stenosis of the P2 segment left PCA. Additional multifocal arterial stenoses as above. Scattered chronic microhemorrhages which may reflect cerebral amyloid angiopathy. Remote infarcts in the bilateral thalami and left pons. Moderate chronic microvascular ischemic changes and mild parenchymal volume loss. Electronically Signed   By: Denny Flack M.D.   On: 12/12/2023 11:30   MR ANGIO HEAD WO CONTRAST Result Date: 12/12/2023 CLINICAL DATA:  Neuro deficit, concern for stroke, right arm weakness. History of dementia. EXAM: MRI HEAD WITHOUT CONTRAST MRA HEAD WITHOUT CONTRAST TECHNIQUE: Multiplanar, multi-echo pulse sequences of the brain and surrounding structures were acquired without intravenous contrast. Angiographic  images of the Circle of Willis were acquired using MRA technique without intravenous contrast. COMPARISON:  CT head  12/10/2023. FINDINGS: MRI HEAD FINDINGS Brain: Acute infarcts centered in the posterior limb of the left internal capsule. There are a few scattered chronic micro hemorrhages involving the bilateral frontal lobes, right parietal lobe, and posterior left temporal lobe. Scattered and confluent FLAIR signal abnormality in the periventricular and subcortical white matter. Remote infarcts in the thalami and in the left dorsal pons at the level of the superior cerebellar peduncles. No mass lesion or midline shift. Cerebellum is unremarkable. Normal appearance of midline structures. The basilar cisterns are patent. No extra-axial fluid collections. Ventricles: Prominence of the lateral ventricles suggestive of underlying parenchymal volume loss. Vascular: Skull base flow voids are visualized. Skull and upper cervical spine: Degenerative changes in the visualized upper cervical spine. Trace retrolisthesis of C2 on C3. Disc osteophyte complex at C2-3 results in at least mild spinal canal narrowing. The visualized calvarium is unremarkable. Sinuses/Orbits: Orbits are symmetric. Paranasal sinuses are clear. Other: Mastoid air cells are clear. MRA HEAD FINDINGS Anterior circulation: The visualized distal cervical ICAs are patent. Intracranial ICAs are patent and normal in caliber. The right M1 segment is patent. There is severe stenosis of a proximal M2 branch of the right MCA just distal to the bifurcation. Additional focal moderate stenosis of an M2 inferior division branch of the right MCA. The left M1 segment is patent. Focal severe stenosis of a distal M2 superior division branch of the left MCA. The A1 and A2 segments are patent bilaterally. Mild stenosis of the proximal A4 segment of the left ACA. Posterior circulation: The visualized intracranial vertebral arteries are patent. The basilar artery is patent. The bilateral PCAs are patent. There is long segment severe stenosis of the anterior P2 segment of the left PCA.  The superior cerebellar arteries are patent bilaterally. The anterior inferior cerebellar arteries are patent. PICA visualized on the right. Anatomic variants: None significant. IMPRESSION: Acute infarcts involving the posterior limb of the left internal capsule. No large vessel occlusion. Severe stenosis of a proximal M2 branch of the right MCA. Additional long segment severe stenosis of the P2 segment left PCA. Additional multifocal arterial stenoses as above. Scattered chronic microhemorrhages which may reflect cerebral amyloid angiopathy. Remote infarcts in the bilateral thalami and left pons. Moderate chronic microvascular ischemic changes and mild parenchymal volume loss. Electronically Signed   By: Denny Flack M.D.   On: 12/12/2023 11:30   DG Shoulder Right Result Date: 12/12/2023 CLINICAL DATA:  Right shoulder pain. EXAM: RIGHT SHOULDER - 2+ VIEW COMPARISON:  None Available. FINDINGS: There is no evidence of fracture or dislocation. Moderate degenerative changes seen involving the right acromioclavicular joint as well as right glenohumeral joint. Soft tissues are unremarkable. IMPRESSION: Moderate degenerative changes seen involving right acromioclavicular and glenohumeral joints. No acute abnormality seen. Electronically Signed   By: Rosalene Colon M.D.   On: 12/12/2023 10:05   ECHOCARDIOGRAM COMPLETE Result Date: 12/11/2023    ECHOCARDIOGRAM REPORT   Patient Name:   ENDORA TERESI Date of Exam: 12/11/2023 Medical Rec #:  [id number]     Height:       63.0 in Accession #:    [id number]    Weight:       176.4 lb Date of Birth:  [birth year]     BSA:          1.833 m Patient Age:    82 years      BP:  151/61 mmHg Patient Gender: F             HR:           84 bpm. Exam Location:  Inpatient Procedure: 2D Echo, Color Doppler, Cardiac Doppler and Intracardiac            Opacification Agent (Both Spectral and Color Flow Doppler were            utilized during procedure). Indications:    Stroke   History:        Patient has prior history of Echocardiogram examinations, most                 recent 04/26/2022. Risk Factors:Hypertension and Diabetes.  Sonographer:    Sulema Endo RDCS, FE, PE Referring Phys: [phone] [facility]  Sonographer Comments: Suboptimal apical window. IMPRESSIONS  1. Abnormal (paradoxical) septal motion, consistent with either left bundle branch block or pacemaker. Left ventricular ejection fraction, by estimation, is 55 to 60%. The left ventricle has normal function. The left ventricle has no regional wall motion abnormalities. Left ventricular diastolic parameters are consistent with Grade II diastolic dysfunction (pseudonormalization). Elevated left atrial pressure. The E/e' is 23.  2. Right ventricular systolic function is normal. The right ventricular size is normal.  3. The mitral valve is degenerative. No evidence of mitral valve regurgitation. No evidence of mitral stenosis. Moderate mitral annular calcification.  4. The aortic valve is calcified. Aortic valve regurgitation is not visualized. Mild aortic stenosis (DI 0.5,peak velocity 1.72m/s, MG 5.92mmHG, AVA VTI 1.1cm2).  5. The inferior vena cava is normal in size with greater than 50% respiratory variability, suggesting right atrial pressure of 3 mmHg. Conclusion(s)/Recommendation(s): No intracardiac source of embolism detected on this transthoracic study. Consider a transesophageal echocardiogram to exclude cardiac source of embolism if clinically indicated. No left ventricular mural or apical thrombus/thrombi. FINDINGS  Left Ventricle: Abnormal (paradoxical) septal motion, consistent with either left bundle branch block or pacemaker. Left ventricular ejection fraction, by estimation, is 55 to 60%. The left ventricle has normal function. The left ventricle has no regional wall motion abnormalities. Definity  contrast agent was given IV to delineate the left ventricular endocardial borders. The left ventricular internal cavity  size was normal in size. There is no left ventricular hypertrophy. Left ventricular diastolic parameters are consistent with Grade II diastolic dysfunction (pseudonormalization). Elevated left atrial pressure. The E/e' is 88. Right Ventricle: The right ventricular size is normal. Right vetricular wall thickness was not well visualized. Right ventricular systolic function is normal. Left Atrium: Left atrial size was normal in size. Right Atrium: Right atrial size was normal in size. Pericardium: There is no evidence of pericardial effusion. Mitral Valve: The mitral valve is degenerative in appearance. Moderate mitral annular calcification. No evidence of mitral valve regurgitation. No evidence of mitral valve stenosis. Tricuspid Valve: The tricuspid valve is normal in structure. Tricuspid valve regurgitation is not demonstrated. No evidence of tricuspid stenosis. Aortic Valve: The aortic valve is calcified. Aortic valve regurgitation is not visualized. Mild aortic stenosis (DI 0.5,peak velocity 1.56m/s, MG 5.6mmHG, AVA VTI 1.1cm2). Aortic valve mean gradient measures 5.5 mmHg. Aortic valve peak gradient measures 10.7 mmHg. Aortic valve area, by VTI measures 1.13 cm. Pulmonic Valve: The pulmonic valve was normal in structure. Pulmonic valve regurgitation is not visualized. No evidence of pulmonic stenosis. Aorta: The aortic root is normal in size and structure. Venous: The inferior vena cava is normal in size with greater than 50% respiratory variability, suggesting right atrial pressure  of 3 mmHg. IAS/Shunts: The interatrial septum was not well visualized. Additional Comments: A device lead is visualized.  LEFT VENTRICLE PLAX 2D LVIDd:         4.30 cm   Diastology LVIDs:         3.10 cm   LV e' medial:    6.85 cm/s LV PW:         1.00 cm   LV E/e' medial:  25.5 LV IVS:        1.10 cm   LV e' lateral:   8.81 cm/s LVOT diam:     1.70 cm   LV E/e' lateral: 19.9 LV SV:         35 LV SV Index:   19 LVOT Area:     2.27 cm   RIGHT VENTRICLE RV S prime:     10.20 cm/s TAPSE (M-mode): 1.5 cm LEFT ATRIUM             Index        RIGHT ATRIUM           Index LA Vol (A2C):   46.0 ml 25.10 ml/m  RA Area:     14.80 cm LA Vol (A4C):   53.6 ml 29.24 ml/m  RA Volume:   35.80 ml  19.53 ml/m LA Biplane Vol: 52.9 ml 28.86 ml/m  AORTIC VALVE AV Area (Vmax):    1.18 cm AV Area (Vmean):   1.15 cm AV Area (VTI):     1.13 cm AV Vmax:           163.50 cm/s AV Vmean:          110.000 cm/s AV VTI:            0.310 m AV Peak Grad:      10.7 mmHg AV Mean Grad:      5.5 mmHg LVOT Vmax:         85.20 cm/s LVOT Vmean:        55.900 cm/s LVOT VTI:          0.155 m LVOT/AV VTI ratio: 0.50  AORTA Ao Root diam: 2.70 cm MITRAL VALVE MV Area (PHT): 4.10 cm     SHUNTS MV Decel Time: 185 msec     Systemic VTI:  0.16 m MV E velocity: 175.00 cm/s  Systemic Diam: 1.70 cm Sunit Tolia Electronically signed by Olinda Bertrand Signature Date/Time: 12/11/2023/6:39:09 PM    Final    CT Head Wo Contrast Result Date: 12/10/2023 CLINICAL DATA:  Provided history: Neuro deficit, acute, stroke suspected. Additional history provided: Recent fall, weakness, right arm weakness. EXAM: CT HEAD WITHOUT CONTRAST TECHNIQUE: Contiguous axial images were obtained from the base of the skull through the vertex without intravenous contrast. RADIATION DOSE REDUCTION: This exam was performed according to the departmental dose-optimization program which includes automated exposure control, adjustment of the mA and/or kV according to patient size and/or use of iterative reconstruction technique. COMPARISON:  Head CT 12/08/2023. FINDINGS: Brain: Generalized cerebral atrophy. Asymmetric hypodensity within the posterior limb of left internal capsule, new from the prior head CT of 12/08/2023 and suspicious for an acute infarct (for instance as seen on series 2, images 12 and 13) (series 5, image 41). Patchy and ill-defined hypoattenuation within the cerebral white matter, nonspecific but  compatible with moderate chronic small vessel ischemic disease. Small chronic lacunar infarct again demonstrated within the right thalamus. Partially empty sella turcica. There is no acute intracranial hemorrhage. No extra-axial fluid collection. No evidence  of an intracranial mass. No midline shift. Vascular: No hyperdense vessel. Atherosclerotic calcifications. Skull: No calvarial fracture or aggressive osseous lesion. Sinuses/Orbits: No mass or acute finding within the imaged orbits. No significant paranasal sinus disease at the imaged levels. IMPRESSION: 1. Asymmetric hypodensity within the posterior limb of left internal capsule, new from the prior head CT of 12/08/2023 and suspicious for an acute infarct. Consider a brain MRI for further evaluation. 2. Background moderate cerebral white matter chronic small vessel ischemic disease. 3. Unchanged right thalamic lacunar infarct. 4. Cerebral atrophy. Electronically Signed   By: Bascom Lily D.O.   On: 12/10/2023 18:32   CT Cervical Spine Wo Contrast Result Date: 12/08/2023 CLINICAL DATA:  83 year old female Status post unwitnessed fall yesterday afternoon. Dizziness, headache. EXAM: CT CERVICAL SPINE WITHOUT CONTRAST TECHNIQUE: Multidetector CT imaging of the cervical spine was performed without intravenous contrast. Multiplanar CT image reconstructions were also generated. RADIATION DOSE REDUCTION: This exam was performed according to the departmental dose-optimization program which includes automated exposure control, adjustment of the mA and/or kV according to patient size and/or use of iterative reconstruction technique. COMPARISON:  Head CT today.  Cervical spine CT 08/29/2018. FINDINGS: Alignment: Stable lordosis. Cervicothoracic junction alignment is within normal limits. Bilateral posterior element alignment is within normal limits. Skull base and vertebrae: Visualized skull base is intact. No atlanto-occipital dissociation. C1 and C2 appear intact and  aligned. Diffuse idiopathic skeletal hyperostosis (DISH). No acute osseous abnormality identified. Soft tissues and spinal canal: No prevertebral fluid or swelling. No visible canal hematoma. Negative visible noncontrast neck soft tissues aside from calcified cervical carotid atherosclerosis. Disc levels: Hyperostosis related chronic interbody ankylosis C3-C4, and C5 through the upper thorax. There is chronic non fusion of C4-C5, that level appears stable since 2020 except for progressed moderate to severe ligament flavum hypertrophy there. However, multifactorial spinal stenosis appears most pronounced at the C2-C3 level (sagittal image 44) and is mild-to-moderate there (series 5 image 44). Upper chest: Upper thoracic spinal ankylosis. Visible upper thoracic levels appear intact. Left chest cardiac pacer device partially visible. Stable upper lung since 2020. IMPRESSION: 1. 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 2. Up to moderate superimposed degenerative spinal stenosis at the unfused C2-C3 level. Electronically Signed   By: Marlise Simpers M.D.   On: 12/08/2023 12:21   CT Head Wo Contrast Result Date: 12/08/2023 CLINICAL DATA:  83 year old female Status post unwitnessed fall yesterday afternoon. Dizziness, headache. EXAM: CT HEAD WITHOUT CONTRAST TECHNIQUE: Contiguous axial images were obtained from the base of the skull through the vertex without intravenous contrast. RADIATION DOSE REDUCTION: This exam was performed according to the departmental dose-optimization program which includes automated exposure control, adjustment of the mA and/or kV according to patient size and/or use of iterative reconstruction technique. COMPARISON:  Head CT 06/30/2021. FINDINGS: Brain: Stable cerebral volume, normal for age. No midline shift, ventriculomegaly, mass effect, evidence of mass lesion, intracranial hemorrhage or evidence of cortically based acute infarction. Patchy  and moderate for age bilateral cerebral white matter hypodensity has not significantly changed since 2022. Vascular: Calcified atherosclerosis at the skull base. No suspicious intracranial vascular hyperdensity. Skull: Chronic hyperostosis frontalis. No acute osseous abnormality identified. Sinuses/Orbits: Visualized paranasal sinuses and mastoids are stable and well aerated. Other: Right lateral forehead, supraorbital scalp hematoma or contusion series 4, image 49. Underlying right frontal bone and sinus appear intact. Stable orbits soft tissues. Calcified scalp vessel atherosclerosis. IMPRESSION: 1. Right forehead scalp hematoma or contusion. No skull fracture.  2. No acute intracranial abnormality. Stable moderate for age cerebral white matter disease. Electronically Signed   By: Marlise Simpers M.D.   On: 12/08/2023 12:17   DG Pelvis Portable Result Date: 12/08/2023 CLINICAL DATA:  Marvell Slider yesterday.  Pelvic pain. EXAM: PORTABLE PELVIS 1-2 VIEWS COMPARISON:  CT 04/25/2022 FINDINGS: No evidence of acute pelvic or hip fracture. Chronic calcification adjacent lesser trochanter of the right femur is a chronic finding. Extensive regional vascular calcification is present. Calcification associated with a uterine leiomyoma is again visible. IMPRESSION: No acute or traumatic finding. Chronic calcification adjacent to the lesser trochanter of the right femur. Extensive regional vascular calcification. Electronically Signed   By: Bettylou Brunner M.D.   On: 12/08/2023 11:14   DG Chest Portable 1 View Result Date: 12/08/2023 CLINICAL DATA:  Unwitnessed fall.  Dementia. EXAM: PORTABLE CHEST 1 VIEW COMPARISON:  04/25/2022 FINDINGS: Dual lead pacemaker in place. Heart size is normal. The patient has taken a poor inspiration. Prominent markings at the lung bases could relate to the poor inspiration or there could possibly be pulmonary venous hypertension with early interstitial edema. No dense consolidation or lobar collapse. No visible  effusion. No acute bone finding. Chronic degenerative changes and subluxation of the right shoulder. IMPRESSION: Poor inspiration. Prominent markings at the lung bases could relate to the poor inspiration or there could possibly be pulmonary venous hypertension with early interstitial edema. Electronically Signed   By: Bettylou Brunner M.D.   On: 12/08/2023 11:13    Micro Results  Recent Results (from the past 240 hours)  Urine Culture     Status: Abnormal   Collection Time: 12/08/23 12:20 PM   Specimen: Urine, Random  Result Value Ref Range Status   Specimen Description URINE, RANDOM  Final   Special Requests   Final    NONE Reflexed from [id number] Performed at [facility], [address] [place], [place] [address]    Culture MULTIPLE SPECIES PRESENT, SUGGEST RECOLLECTION (A)  Final   Report Status 12/10/2023 FINAL  Final  Resp panel by RT-PCR (RSV, Flu A&B, Covid) Anterior Nasal Swab     Status: None   Collection Time: 12/10/23 11:03 AM   Specimen: Anterior Nasal Swab  Result Value Ref Range Status   SARS Coronavirus 2 by RT PCR NEGATIVE NEGATIVE Final    Comment: (NOTE) SARS-CoV-2 target nucleic acids are NOT DETECTED.  The SARS-CoV-2 RNA is generally detectable in upper respiratory specimens during the acute phase of infection. The lowest concentration of SARS-CoV-2 viral copies this assay can detect is 138 copies/mL. A negative result does not preclude SARS-Cov-2 infection and should not be used as the sole basis for treatment or other patient management decisions. A negative result may occur with  improper specimen collection/handling, submission of specimen other than nasopharyngeal swab, presence of viral mutation(s) within the areas targeted by this assay, and inadequate number of viral copies(<138 copies/mL). A negative result must be combined with clinical observations, patient history, and epidemiological information. The expected result is Negative.  Fact Sheet for  Patients:  BloggerCourse.com  Fact Sheet for Healthcare Providers:  SeriousBroker.it  This test is no t yet approved or cleared by the [place]  FDA and  has been authorized for detection and/or diagnosis of SARS-CoV-2 by FDA under an Emergency Use Authorization (EUA). This EUA will remain  in effect (meaning this test can be used) for the duration of the COVID-19 declaration under Section 564(b)(1) of the Act, 21 U.S.C.section 360bbb-3(b)(1), unless the authorization  is terminated  or revoked sooner.       Influenza A by PCR NEGATIVE NEGATIVE Final   Influenza B by PCR NEGATIVE NEGATIVE Final    Comment: (NOTE) The Xpert Xpress SARS-CoV-2/FLU/RSV plus assay is intended as an aid in the diagnosis of influenza from Nasopharyngeal swab specimens and should not be used as a sole basis for treatment. Nasal washings and aspirates are unacceptable for Xpert Xpress SARS-CoV-2/FLU/RSV testing.  Fact Sheet for Patients: BloggerCourse.com  Fact Sheet for Healthcare Providers: SeriousBroker.it  This test is not yet approved or cleared by the [place]  FDA and has been authorized for detection and/or diagnosis of SARS-CoV-2 by FDA under an Emergency Use Authorization (EUA). This EUA will remain in effect (meaning this test can be used) for the duration of the COVID-19 declaration under Section 564(b)(1) of the Act, 21 U.S.C. section 360bbb-3(b)(1), unless the authorization is terminated or revoked.     Resp Syncytial Virus by PCR NEGATIVE NEGATIVE Final    Comment: (NOTE) Fact Sheet for Patients: BloggerCourse.com  Fact Sheet for Healthcare Providers: SeriousBroker.it  This test is not yet approved or cleared by the [place]  FDA and has been authorized for detection and/or diagnosis of SARS-CoV-2 by FDA under an Emergency  Use Authorization (EUA). This EUA will remain in effect (meaning this test can be used) for the duration of the COVID-19 declaration under Section 564(b)(1) of the Act, 21 U.S.C. section 360bbb-3(b)(1), unless the authorization is terminated or revoked.  Performed at [facility], [address] [place], [place] [address]     Today   Subjective    Chloeanne Poteet today has no headache,no chest abdominal pain,no new weakness tingling or numbness, feels much better wants to go home today.    Objective   Blood pressure (!) 158/71, pulse 90, temperature 98.1 F (36.7 C), temperature source Oral, resp. rate 13, height 5\' 3"  (1.6 m), weight 80 kg, SpO2 93%.   Intake/Output Summary (Last 24 hours) at 12/13/2023 0735 Last data filed at 12/12/2023 1400 Gross per 24 hour  Intake 595 ml  Output 600 ml  Net -5 ml    Exam  Awake Alert, No new F.N deficits,    [place].AT,PERRAL Supple Neck,   Symmetrical Chest wall movement, Good air movement bilaterally, CTAB RRR,No Gallops,   +ve B.Sounds, Abd Soft, Non tender,  No Cyanosis, Clubbing or edema    Data Review   Recent Labs  Lab 12/08/23 1017 12/10/23 1044 12/12/23 0448 12/13/23 0450  WBC 6.8 6.9 6.6 6.5  HGB 11.9* 11.8* 11.1* 11.9*  HCT 37.8 38.4 35.8* 38.1  PLT 272 276 251 268  MCV 87.5 87.9 87.3 87.8  MCH 27.5 27.0 27.1 27.4  MCHC 31.5 30.7 31.0 31.2  RDW 13.9 14.0 14.1 14.2  LYMPHSABS 1.6 1.6  --   --   MONOABS 0.6 0.5  --   --   EOSABS 0.1 0.1  --   --   BASOSABS 0.0 0.0  --   --     Recent Labs  Lab 12/08/23 1017 12/10/23 1044 12/10/23 1135 12/10/23 1137 12/10/23 1259 12/11/23 1019 12/12/23 0448 12/13/23 0450  NA 143 139  --   --   --  144 139 140  K 4.1 4.1  --   --   --  3.9 4.5 4.2  CL 111 105  --   --   --  108 109 106  CO2 23 23  --   --   --  21* 22 23  ANIONGAP 9 11  --   --   --  15 8 11   GLUCOSE 99 190*  --   --   --  163* 189* 156*  BUN 19 25*  --   --   --  18 18 20    CREATININE 1.31* 1.53*  --   --   --  1.22* 1.47* 1.48*  AST  --  16  --   --   --   --   --   --   ALT  --  8  --   --   --   --   --   --   ALKPHOS  --  67  --   --   --   --   --   --   BILITOT  --  0.6  --   --   --   --   --   --   ALBUMIN   --  3.1*  --   --   --   --   --   --   INR  --   --   --   --  1.0  --   --   --   TSH  --   --   --  2.153  --   --   --   --   HGBA1C  --   --  8.8*  --   --   --   --   --   CALCIUM  9.0 8.8*  --   --   --  8.1* 8.4* 8.9    Total Time in preparing paper work, data evaluation and todays exam - 35 minutes  Signature  -    Lynnwood Sauer M.D on 12/13/2023 at 7:35 AM   -  To page go to [email or web address]

## 2024-01-09 ENCOUNTER — Ambulatory Visit (INDEPENDENT_AMBULATORY_CARE_PROVIDER_SITE_OTHER): Payer: Medicare Other

## 2024-01-09 DIAGNOSIS — I442 Atrioventricular block, complete: Secondary | ICD-10-CM

## 2024-01-10 ENCOUNTER — Ambulatory Visit: Payer: Self-pay | Admitting: Cardiology

## 2024-01-10 LAB — CUP PACEART REMOTE DEVICE CHECK
Battery Remaining Longevity: 78 mo
Battery Voltage: 2.98 V
Brady Statistic AP VP Percent: 5.58 %
Brady Statistic AP VS Percent: 0 %
Brady Statistic AS VP Percent: 94.32 %
Brady Statistic AS VS Percent: 0.11 %
Brady Statistic RA Percent Paced: 5.53 %
Brady Statistic RV Percent Paced: 99.89 %
Date Time Interrogation Session: 20250522125330
Implantable Lead Connection Status: 753985
Implantable Lead Connection Status: 753985
Implantable Lead Implant Date: 20210720
Implantable Lead Implant Date: 20210720
Implantable Lead Location: 753859
Implantable Lead Location: 753860
Implantable Lead Model: 5076
Implantable Lead Model: 5076
Implantable Pulse Generator Implant Date: 20210720
Lead Channel Impedance Value: 247 Ohm
Lead Channel Impedance Value: 266 Ohm
Lead Channel Impedance Value: 304 Ohm
Lead Channel Impedance Value: 342 Ohm
Lead Channel Pacing Threshold Amplitude: 0.625 V
Lead Channel Pacing Threshold Amplitude: 0.875 V
Lead Channel Pacing Threshold Pulse Width: 0.4 ms
Lead Channel Pacing Threshold Pulse Width: 0.4 ms
Lead Channel Sensing Intrinsic Amplitude: 1.375 mV
Lead Channel Sensing Intrinsic Amplitude: 1.375 mV
Lead Channel Sensing Intrinsic Amplitude: 5.625 mV
Lead Channel Sensing Intrinsic Amplitude: 5.75 mV
Lead Channel Setting Pacing Amplitude: 1.5 V
Lead Channel Setting Pacing Amplitude: 2.5 V
Lead Channel Setting Pacing Pulse Width: 0.4 ms
Lead Channel Setting Sensing Sensitivity: 1.2 mV
Zone Setting Status: 755011

## 2024-02-03 ENCOUNTER — Emergency Department (HOSPITAL_COMMUNITY)

## 2024-02-03 ENCOUNTER — Other Ambulatory Visit: Payer: Self-pay

## 2024-02-03 ENCOUNTER — Observation Stay (HOSPITAL_COMMUNITY)
Admission: EM | Admit: 2024-02-03 | Discharge: 2024-02-06 | Disposition: A | Discharge status: Skilled Nursing Facility | Attending: Internal Medicine | Admitting: Internal Medicine

## 2024-02-03 DIAGNOSIS — D649 Anemia, unspecified: Secondary | ICD-10-CM | POA: Insufficient documentation

## 2024-02-03 DIAGNOSIS — N1832 Chronic kidney disease, stage 3b: Secondary | ICD-10-CM | POA: Diagnosis not present

## 2024-02-03 DIAGNOSIS — I5033 Acute on chronic diastolic (congestive) heart failure: Principal | ICD-10-CM | POA: Insufficient documentation

## 2024-02-03 DIAGNOSIS — R059 Cough, unspecified: Secondary | ICD-10-CM | POA: Insufficient documentation

## 2024-02-03 DIAGNOSIS — E1142 Type 2 diabetes mellitus with diabetic polyneuropathy: Secondary | ICD-10-CM | POA: Diagnosis not present

## 2024-02-03 DIAGNOSIS — R509 Fever, unspecified: Principal | ICD-10-CM

## 2024-02-03 DIAGNOSIS — J189 Pneumonia, unspecified organism: Secondary | ICD-10-CM | POA: Insufficient documentation

## 2024-02-03 DIAGNOSIS — Z79899 Other long term (current) drug therapy: Secondary | ICD-10-CM | POA: Insufficient documentation

## 2024-02-03 DIAGNOSIS — R0981 Nasal congestion: Secondary | ICD-10-CM | POA: Diagnosis present

## 2024-02-03 DIAGNOSIS — Z7901 Long term (current) use of anticoagulants: Secondary | ICD-10-CM | POA: Diagnosis not present

## 2024-02-03 DIAGNOSIS — F0390 Unspecified dementia without behavioral disturbance: Secondary | ICD-10-CM | POA: Insufficient documentation

## 2024-02-03 DIAGNOSIS — J45909 Unspecified asthma, uncomplicated: Secondary | ICD-10-CM | POA: Diagnosis not present

## 2024-02-03 DIAGNOSIS — H409 Unspecified glaucoma: Secondary | ICD-10-CM | POA: Diagnosis not present

## 2024-02-03 DIAGNOSIS — Z794 Long term (current) use of insulin: Secondary | ICD-10-CM | POA: Insufficient documentation

## 2024-02-03 DIAGNOSIS — E1139 Type 2 diabetes mellitus with other diabetic ophthalmic complication: Secondary | ICD-10-CM | POA: Diagnosis not present

## 2024-02-03 DIAGNOSIS — Z87891 Personal history of nicotine dependence: Secondary | ICD-10-CM | POA: Insufficient documentation

## 2024-02-03 DIAGNOSIS — Z950 Presence of cardiac pacemaker: Secondary | ICD-10-CM | POA: Diagnosis not present

## 2024-02-03 DIAGNOSIS — E785 Hyperlipidemia, unspecified: Secondary | ICD-10-CM | POA: Diagnosis not present

## 2024-02-03 DIAGNOSIS — I10 Essential (primary) hypertension: Secondary | ICD-10-CM | POA: Diagnosis present

## 2024-02-03 DIAGNOSIS — G629 Polyneuropathy, unspecified: Secondary | ICD-10-CM

## 2024-02-03 LAB — CBC WITH DIFFERENTIAL/PLATELET
Abs Immature Granulocytes: 0.01 10*3/uL (ref 0.00–0.07)
Basophils Absolute: 0 10*3/uL (ref 0.0–0.1)
Basophils Relative: 0 %
Eosinophils Absolute: 0.2 10*3/uL (ref 0.0–0.5)
Eosinophils Relative: 3 %
HCT: 30.8 % — ABNORMAL LOW (ref 36.0–46.0)
Hemoglobin: 9.5 g/dL — ABNORMAL LOW (ref 12.0–15.0)
Immature Granulocytes: 0 %
Lymphocytes Relative: 23 %
Lymphs Abs: 1.2 10*3/uL (ref 0.7–4.0)
MCH: 27.4 pg (ref 26.0–34.0)
MCHC: 30.8 g/dL (ref 30.0–36.0)
MCV: 88.8 fL (ref 80.0–100.0)
Monocytes Absolute: 0.7 10*3/uL (ref 0.1–1.0)
Monocytes Relative: 14 %
Neutro Abs: 3 10*3/uL (ref 1.7–7.7)
Neutrophils Relative %: 60 %
Platelets: 227 10*3/uL (ref 150–400)
RBC: 3.47 MIL/uL — ABNORMAL LOW (ref 3.87–5.11)
RDW: 14.2 % (ref 11.5–15.5)
WBC: 5 10*3/uL (ref 4.0–10.5)
nRBC: 0 % (ref 0.0–0.2)

## 2024-02-03 LAB — URINALYSIS, W/ REFLEX TO CULTURE (INFECTION SUSPECTED)
Bacteria, UA: NONE SEEN
Bilirubin Urine: NEGATIVE
Glucose, UA: NEGATIVE mg/dL
Hgb urine dipstick: NEGATIVE
Ketones, ur: NEGATIVE mg/dL
Leukocytes,Ua: NEGATIVE
Nitrite: NEGATIVE
Protein, ur: NEGATIVE mg/dL
Specific Gravity, Urine: 1.011 (ref 1.005–1.030)
pH: 6 (ref 5.0–8.0)

## 2024-02-03 LAB — I-STAT CG4 LACTIC ACID, ED: Lactic Acid, Venous: 0.8 mmol/L (ref 0.5–1.9)

## 2024-02-03 LAB — TROPONIN I (HIGH SENSITIVITY)
Troponin I (High Sensitivity): 65 ng/L — ABNORMAL HIGH (ref <18)
Troponin I (High Sensitivity): 61 ng/L — ABNORMAL HIGH (ref <18)

## 2024-02-03 LAB — COMPREHENSIVE METABOLIC PANEL WITH GFR
ALT: 9 U/L (ref 0–44)
AST: 13 U/L — ABNORMAL LOW (ref 15–41)
Albumin: 3 g/dL — ABNORMAL LOW (ref 3.5–5.0)
Alkaline Phosphatase: 74 U/L (ref 38–126)
Anion gap: 11 (ref 5–15)
BUN: 19 mg/dL (ref 8–23)
CO2: 24 mmol/L (ref 22–32)
Calcium: 8.5 mg/dL — ABNORMAL LOW (ref 8.9–10.3)
Chloride: 104 mmol/L (ref 98–111)
Creatinine, Ser: 1.55 mg/dL — ABNORMAL HIGH (ref 0.44–1.00)
GFR, Estimated: 33 mL/min — ABNORMAL LOW (ref >60)
Glucose, Bld: 129 mg/dL — ABNORMAL HIGH (ref 70–99)
Potassium: 3.6 mmol/L (ref 3.5–5.1)
Sodium: 139 mmol/L (ref 135–145)
Total Bilirubin: 0.6 mg/dL (ref 0.0–1.2)
Total Protein: 6.3 g/dL — ABNORMAL LOW (ref 6.5–8.1)

## 2024-02-03 LAB — BRAIN NATRIURETIC PEPTIDE: B Natriuretic Peptide: 879.2 pg/mL — ABNORMAL HIGH (ref 0.0–100.0)

## 2024-02-03 LAB — RESP PANEL BY RT-PCR (RSV, FLU A&B, COVID)  RVPGX2
Influenza A by PCR: NEGATIVE
Influenza B by PCR: NEGATIVE
Resp Syncytial Virus by PCR: NEGATIVE
SARS Coronavirus 2 by RT PCR: NEGATIVE

## 2024-02-03 LAB — MAGNESIUM: Magnesium: 1.5 mg/dL — ABNORMAL LOW (ref 1.7–2.4)

## 2024-02-03 LAB — PROCALCITONIN: Procalcitonin: <0.1 ng/mL

## 2024-02-03 LAB — PHOSPHORUS: Phosphorus: 3.7 mg/dL (ref 2.5–4.6)

## 2024-02-03 LAB — CBG MONITORING, ED: Glucose-Capillary: 117 mg/dL — ABNORMAL HIGH (ref 70–99)

## 2024-02-03 LAB — GLUCOSE, CAPILLARY: Glucose-Capillary: 116 mg/dL — ABNORMAL HIGH (ref 70–99)

## 2024-02-03 MED ORDER — INSULIN GLARGINE-YFGN 100 UNIT/ML ~~LOC~~ SOLN
24.0000 [IU] | Freq: Every day | SUBCUTANEOUS | Status: DC
  Administered 2024-02-03 – 2024-02-05 (×3): 24 [IU] via SUBCUTANEOUS
  Filled 2024-02-03 (×4): qty 0.24

## 2024-02-03 MED ORDER — POTASSIUM CHLORIDE CRYS ER 20 MEQ PO TBCR
40.0000 meq | EXTENDED_RELEASE_TABLET | Freq: Once | ORAL | Status: DC
  Filled 2024-02-03: qty 2

## 2024-02-03 MED ORDER — CLOPIDOGREL BISULFATE 75 MG PO TABS
75.0000 mg | ORAL_TABLET | Freq: Every day | ORAL | Status: DC
  Administered 2024-02-03 – 2024-02-06 (×4): 75 mg via ORAL
  Filled 2024-02-03 (×4): qty 1

## 2024-02-03 MED ORDER — ONDANSETRON HCL 4 MG PO TABS: 4.0000 mg | ORAL_TABLET | Freq: Four times a day (QID) | ORAL | Status: DC | PRN

## 2024-02-03 MED ORDER — RISPERIDONE 1 MG PO TABS
1.0000 mg | ORAL_TABLET | Freq: Two times a day (BID) | ORAL | Status: DC
  Administered 2024-02-03 – 2024-02-06 (×6): 1 mg via ORAL
  Filled 2024-02-03 (×6): qty 1

## 2024-02-03 MED ORDER — SODIUM CHLORIDE 0.9 % IV SOLN
1.0000 g | Freq: Once | INTRAVENOUS | Status: AC
  Administered 2024-02-03: 1 g via INTRAVENOUS
  Filled 2024-02-03: qty 10

## 2024-02-03 MED ORDER — LOPERAMIDE HCL 2 MG PO CAPS: 2.0000 mg | ORAL_CAPSULE | ORAL | Status: DC | PRN

## 2024-02-03 MED ORDER — GUAIFENESIN-DM 100-10 MG/5ML PO SYRP: 10.0000 mL | ORAL_SOLUTION | Freq: Four times a day (QID) | ORAL | Status: DC | PRN

## 2024-02-03 MED ORDER — FUROSEMIDE 10 MG/ML IJ SOLN
20.0000 mg | Freq: Once | INTRAMUSCULAR | Status: AC
  Administered 2024-02-03: 20 mg via INTRAVENOUS
  Filled 2024-02-03: qty 2

## 2024-02-03 MED ORDER — ALBUTEROL SULFATE (2.5 MG/3ML) 0.083% IN NEBU: 2.5000 mg | INHALATION_SOLUTION | RESPIRATORY_TRACT | Status: DC | PRN

## 2024-02-03 MED ORDER — HYDRALAZINE HCL 25 MG PO TABS
25.0000 mg | ORAL_TABLET | Freq: Three times a day (TID) | ORAL | Status: DC
  Administered 2024-02-04 – 2024-02-06 (×7): 25 mg via ORAL
  Filled 2024-02-03 (×8): qty 1

## 2024-02-03 MED ORDER — SODIUM CHLORIDE 0.9 % IV SOLN
500.0000 mg | Freq: Once | INTRAVENOUS | Status: AC
  Administered 2024-02-03: 500 mg via INTRAVENOUS
  Filled 2024-02-03: qty 5

## 2024-02-03 MED ORDER — SODIUM CHLORIDE 0.9 % IV SOLN
500.0000 mg | INTRAVENOUS | Status: DC
  Administered 2024-02-04: 500 mg via INTRAVENOUS
  Filled 2024-02-03: qty 5

## 2024-02-03 MED ORDER — ACETAMINOPHEN 650 MG RE SUPP: 650.0000 mg | Freq: Four times a day (QID) | RECTAL | Status: DC | PRN

## 2024-02-03 MED ORDER — POLYVINYL ALCOHOL 1.4 % OP SOLN: 1.0000 [drp] | Freq: Two times a day (BID) | OPHTHALMIC | Status: DC | PRN

## 2024-02-03 MED ORDER — ACETAMINOPHEN 325 MG PO TABS: 650.0000 mg | ORAL_TABLET | Freq: Four times a day (QID) | ORAL | Status: DC | PRN

## 2024-02-03 MED ORDER — HYDRALAZINE HCL 25 MG PO TABS: 25.0000 mg | ORAL_TABLET | Freq: Three times a day (TID) | ORAL | Status: DC

## 2024-02-03 MED ORDER — SODIUM CHLORIDE 0.9 % IV SOLN
1.0000 g | INTRAVENOUS | Status: DC
  Administered 2024-02-04: 1 g via INTRAVENOUS
  Filled 2024-02-03: qty 10

## 2024-02-03 MED ORDER — CYCLOSPORINE 0.05 % OP EMUL
1.0000 [drp] | Freq: Two times a day (BID) | OPHTHALMIC | Status: DC
  Administered 2024-02-03 – 2024-02-06 (×6): 1 [drp] via OPHTHALMIC
  Filled 2024-02-03 (×7): qty 30

## 2024-02-03 MED ORDER — MAGNESIUM SULFATE 2 GM/50ML IV SOLN
2.0000 g | Freq: Once | INTRAVENOUS | Status: AC
  Administered 2024-02-03: 2 g via INTRAVENOUS
  Filled 2024-02-03: qty 50

## 2024-02-03 MED ORDER — ONDANSETRON HCL 4 MG/2ML IJ SOLN: 4.0000 mg | Freq: Four times a day (QID) | INTRAMUSCULAR | Status: DC | PRN

## 2024-02-03 MED ORDER — INSULIN ASPART 100 UNIT/ML IJ SOLN
0.0000 [IU] | Freq: Three times a day (TID) | INTRAMUSCULAR | Status: DC
  Administered 2024-02-04 – 2024-02-05 (×3): 2 [IU] via SUBCUTANEOUS
  Administered 2024-02-05: 1 [IU] via SUBCUTANEOUS
  Administered 2024-02-06: 2 [IU] via SUBCUTANEOUS
  Filled 2024-02-03: qty 0.09

## 2024-02-03 NOTE — H&P (Signed)
 History and Physical    Patient: Gail Lynch WNU:[id number] DOB: 23-May-1941 DOA: 02/03/2024 DOS: the patient was seen and examined on 02/03/2024 PCP: Glynn Lasso, PA-C  Patient coming from: Home  Chief Complaint:  Chief Complaint  Patient presents with   Weakness   Cough   HPI: [name] is a 83 y.o. female with medical history significant of lower GI bleed, gout, osteoarthritis, asthma, carotid stenosis, colon polyps, type 2 diabetes, HPV infection, stage I CIN treated with cervical conization, glaucoma, impaired hearing, hyperlipidemia, hypertension, intrinsic asthma, mild aortic stenosis, onychomycosis, dementia who was brought from her skilled nursing facility due to the staff noticing generalized weakness, cough and congestion since yesterday.  She is able to answer simple questions.  No dyspnea, headache, chest or abdominal pain at this time.  Lab work: Urinalysis was normal.  Negative coronavirus, influenza and RSV PCR test.  CBC showed white count 5.0, hemoglobin 9.5 g/dL platelets 034.  Troponin was 65 and 61 mg/L and BNP 879.2 pg/mL.  Normal procalcitonin level.  Magnesium  1.5 and phosphorus 3.7 mg/dL.  CMP showed normal electrolytes after calcium  correction.  Glucose 129, BUN 19 and creatinine 1.55 mg/dL.  Total (6.3 and albumin  3.0 g/dL.  AST was 13, normal ALT, alkaline phosphatase and total bilirubin.  Imaging: Portable 1 view chest radiograph showing left base consolidation and small effusion.  CT chest without contrast showing diffuse bronchial wall thickening with scattered areas of consolidative and centrilobular opacities.  Findings may reflect an atypical infection with more focal areas of atelectasis/infection versus pulmonary edema with more focal areas of atelectasis/infection/aspiration.  There is a small left pleural effusion.  Given more focal nodularity CT recommended at 6 to 12 months.  There was aortic atherosclerosis.   ED course: Initial vital signs  were temperature 99.8 F, pulse 95, respiration 13, BP 141/63 mmHg O2 sat 95% on room air.  The patient received azithromycin  500 mg IVP, ceftriaxone  1000 mg IVP.  I added furosemide 20 mg IVP and magnesium  sulfate 2 g IVPB.  Review of Systems: As mentioned in the history of present illness. All other systems reviewed and are negative.  Past Medical History:  Diagnosis Date   Acute lower GI bleeding 05/03/2020   Arthritis    gout in feet   Asthma    hx as a child - no inhaler, no problems   Carotid stenosis 06/18/2012   Colon polyps    Diabetes mellitus    Type 2   Dysplasia of cervix, low grade (CIN 1)    s/p cervical conization   Glaucoma    Glaucoma 12/18/2011   Hard of hearing    hear aids - left   HPV (human papilloma virus) infection    Hyperlipidemia    Hypertension    Intrinsic asthma, unspecified 12/18/2011   Mild aortic stenosis 03/14/2017   Mycotic toenails 02/19/2013   PONV (postoperative nausea and vomiting)    SVD (spontaneous vaginal delivery)    x 4   Past Surgical History:  Procedure Laterality Date   BREAST BIOPSY  2010   BREAST BIOPSY  1991   COLONOSCOPY     COLONOSCOPY WITH PROPOFOL  N/A 05/06/2020   Procedure: COLONOSCOPY WITH PROPOFOL ;  Surgeon: Nannette Babe, MD;  Location: [facility];  Service: Endoscopy;  Laterality: N/A;   DILATION AND CURETTAGE OF UTERUS  2000   HYSTEROSCOPY WITH D & C N/A 03/04/2014   Procedure: DILATATION AND CURETTAGE /HYSTEROSCOPY;  Surgeon: Lenord Radon, MD;  Location: [facility];  Service: Gynecology;  Laterality: N/A;   KNEE SURGERY  2010   patella fracture   MULTIPLE TOOTH EXTRACTIONS     PACEMAKER IMPLANT N/A 03/09/2020   Procedure: PACEMAKER IMPLANT;  Surgeon: Lei Pump, MD;  Location: [facility];  Service: Cardiovascular;  Laterality: N/A;   POLYPECTOMY     POLYPECTOMY  05/06/2020   Procedure: POLYPECTOMY;  Surgeon: Nannette Babe, MD;  Location: [facility];  Service: Endoscopy;;   Social  History:  reports that she quit smoking about 43 years ago. Her smoking use included cigarettes. She has quit using smokeless tobacco. She reports that she does not drink alcohol and does not use drugs.  Allergies  Allergen Reactions   Aspirin Other (See Comments)    BLEEDING BEHIND EYES   Penicillins Other (See Comments)    Reaction not cited on MAR    Family History  Problem Relation Age of Onset   Diabetes Brother    Heart disease Brother    Heart disease Mother    Heart disease Father    Asthma Other    Colon cancer Neg Hx    Rectal cancer Neg Hx    Stomach cancer Neg Hx    Esophageal cancer Neg Hx     Prior to Admission medications   Medication Sig Start Date End Date Taking? Authorizing Provider  albuterol  (VENTOLIN  HFA) 108 (90 Base) MCG/ACT inhaler Inhale 2 puffs into the lungs every 4 (four) hours as needed for wheezing or shortness of breath. 05/01/22   Vann, Jessica U, DO  clopidogrel  (PLAVIX ) 75 MG tablet Take 1 tablet (75 mg total) by mouth daily. 12/13/23   Singh, Prashant K, MD  DIABETIC TUSSIN DM MAX ST 10-200 MG/5ML LIQD Take 10 mLs by mouth every 6 (six) hours as needed (for coughing).    [provider]  feeding supplement, GLUCERNA SHAKE, (GLUCERNA SHAKE) LIQD Take 237 mLs by mouth 3 (three) times daily between meals. Patient not taking: Reported on 12/10/2023 05/01/22   Vann, Jessica U, DO  glucose blood (ONETOUCH VERIO) test strip 1 each by Other route 2 (two) times daily. And lancets 2/day 08/03/20   Gwyndolyn Lerner, MD  Lancets [facility] Jewelene Morton PLUS [place]) MISC Test bid 12/28/20   [provider]  LANTUS  SOLOSTAR 100 UNIT/ML Solostar Pen Inject 20 Units into the skin daily. 09/05/21   [provider]  loperamide  (IMODIUM ) 2 MG capsule Take 2 mg by mouth as needed for diarrhea or loose stools (CANNOT EXCEED 16 MG/DAY).    [provider]  Nutritional Supplements (NUTRITIONAL SHAKE) LIQD Take 120 mLs by mouth See admin  instructions. MightyShake : Drink 120 ml's by mouth three times a day with meals    [provider]  ondansetron  (ZOFRAN ) 4 MG tablet Take 4 mg by mouth every 6 (six) hours as needed for nausea or vomiting.    [provider]  REFRESH OPTIVE 1-0.9 % GEL Place 1 application  into both eyes 2 (two) times daily as needed (for dryness).    [provider]  RESTASIS  0.05 % ophthalmic emulsion Place 1 drop into both eyes 2 (two) times daily. 01/29/23   [provider]  risperiDONE  (RISPERDAL ) 1 MG/ML oral solution Take 1 mL (1 mg total) by mouth 2 (two) times daily. 05/01/22   Vann, Jessica U, DO  rosuvastatin  (CRESTOR ) 40 MG tablet Take 1 tablet (40 mg total) by mouth daily. 12/13/23 12/22/24  Cala Castleman, MD    Physical Exam:  Vitals:   02/03/24 0835 02/03/24 1005  BP: (!) 141/63   Pulse: 95   Resp: 13   Temp: 99.8 F (37.7 C) 99 F (37.2 C)  TempSrc: Oral (S) Rectal  SpO2: 95%    Physical Exam Vitals and nursing note reviewed.  Constitutional:      General: She is awake. She is not in acute distress.    Appearance: Normal appearance. She is not ill-appearing.     Interventions: Nasal cannula in place.  HENT:     Head: Normocephalic.     Mouth/Throat:     Mouth: Mucous membranes are moist.   Eyes:     General: No scleral icterus.    Pupils: Pupils are equal, round, and reactive to light.   Neck:     Vascular: JVD present.   Cardiovascular:     Rate and Rhythm: Normal rate and regular rhythm.     Heart sounds: S1 normal and S2 normal.  Pulmonary:     Effort: Pulmonary effort is normal.     Breath sounds: Wheezing and rales present.  Abdominal:     General: There is no distension.     Palpations: Abdomen is soft.     Tenderness: There is no abdominal tenderness. There is no right CVA tenderness or left CVA tenderness.   Musculoskeletal:     Cervical back: Neck supple.     Right lower leg: Edema present.     Left lower leg: Edema  present.   Skin:    General: Skin is warm and dry.   Neurological:     General: No focal deficit present.     Mental Status: She is alert and oriented to person, place, and time.   Psychiatric:        Mood and Affect: Mood normal.        Behavior: Behavior normal. Behavior is cooperative.     Data Reviewed:  Results are pending, will review when available. Echocardiogram 12/11/2023: IMPRESSIONS:   1. Abnormal (paradoxical) septal motion, consistent with either left  bundle branch block or pacemaker. Left ventricular ejection fraction, by  estimation, is 55 to 60%. The left ventricle has normal function. The left  ventricle has no regional wall  motion abnormalities. Left ventricular diastolic parameters are consistent  with Grade II diastolic dysfunction (pseudonormalization). Elevated left  atrial pressure. The E/e' is 23.   2. Right ventricular systolic function is normal. The right ventricular  size is normal.   3. The mitral valve is degenerative. No evidence of mitral valve  regurgitation. No evidence of mitral stenosis. Moderate mitral annular  calcification.   4. The aortic valve is calcified. Aortic valve regurgitation is not  visualized. Mild aortic stenosis (DI 0.5,peak velocity 1.89m/s, MG 5.42mmHG,  AVA VTI 1.1cm2).   5. The inferior vena cava is normal in size with greater than 50%  respiratory variability, suggesting right atrial pressure of 3 mmHg.   Conclusion(s)/Recommendation(s): No intracardiac source of embolism  detected on this transthoracic study. Consider a transesophageal  echocardiogram to exclude cardiac source of embolism if clinically  indicated. No left ventricular mural or apical  thrombus/thrombi.   EKG: Vent. rate 91 BPM PR interval 217 ms QRS duration 145 ms QT/QTcB 398/490 ms P-R-T axes -25 -58 122 A-V dual-paced rhythm with some inhibition No further analysis attempted due to paced rhythm  Assessment and Plan: Principal  Problem:   Acute on chronic diastolic congestive heart failure (HCC) Observation/telemetry. Supplemental oxygen  as needed. Sodium and fluid  restriction. Continue furosemide 20 mg IVP x 1. No beta-blocker due to acute decompensation. No ACE inhibitor due to renal insufficiency. Monitor daily weights, intake and output. Monitor renal function electrolytes. Echocardiogram done recently. Will continue antibiotics for CAP coverage. -Check procalcitonin level today and tomorrow. -Continue or discontinue antibiotics depending on results.  Active Problems:   Hypomagnesemia  Replaced. Follow up level as needed.    Hypertension Continue hydralazine  25 mg p.o. 3 times daily. Also on furosemide IV or p.o.    Hyperlipidemia Not on statin Follow-up with PCP.    Glaucoma Continue eyedrops. Follow-up with ophthalmology as an outpatient.    Peripheral neuropathy Blood glucose control.    Normocytic anemia Monitor hematocrit and hemoglobin.    Dementia Continue risperidone  1 mg p.o. twice daily.     Advance Care Planning:   Code Status: Full Code   Consults:   Family Communication:   Severity of Illness: The appropriate patient status for this patient is INPATIENT. Inpatient status is judged to be reasonable and necessary in order to provide the required intensity of service to ensure the patient's safety. The patient's presenting symptoms, physical exam findings, and initial radiographic and laboratory data in the context of their chronic comorbidities is felt to place them at high risk for further clinical deterioration. Furthermore, it is not anticipated that the patient will be medically stable for discharge from the hospital within 2 midnights of admission.   * I certify that at the point of admission it is my clinical judgment that the patient will require inpatient hospital care spanning beyond 2 midnights from the point of admission due to high intensity of service, high  risk for further deterioration and high frequency of surveillance required.*  Author: Danice Dural, MD 02/03/2024 11:27 AM  For on call review [email or web address].   This document was prepared using Dragon voice recognition software and may contain some unintended transcription errors.

## 2024-02-03 NOTE — ED Provider Notes (Signed)
 [place] EMERGENCY DEPARTMENT AT [facility] Provider Note   CSN: [id number] Arrival date & time: 02/03/24  4098     Patient presents with: Weakness and Cough   Gail Lynch is a 83 y.o. female.   83 year old female with prior medical history of dementia AAO x 2, HTN,DMT2,CKD3a b/l creat 1.0, aortic stenosis, AV nodal block status post pacemaker, dementia, chronic anemia who presents from her facility for evaluation of weakness, cough, congestion.  Symptoms present since yesterday.  Patient is noted to be 92% on room air.  With supplemental O2 at 3 L her sats are up to 98%.  EMS reported temperature of 100.6 during transport.  650 mg of Tylenol  were given.  The history is provided by the patient, medical records, the EMS personnel and the nursing home.       Prior to Admission medications   Medication Sig Start Date End Date Taking? Authorizing Provider  albuterol  (VENTOLIN  HFA) 108 (90 Base) MCG/ACT inhaler Inhale 2 puffs into the lungs every 4 (four) hours as needed for wheezing or shortness of breath. 05/01/22   Vann, Jessica U, DO  clopidogrel  (PLAVIX ) 75 MG tablet Take 1 tablet (75 mg total) by mouth daily. 12/13/23   Singh, Prashant K, MD  DIABETIC TUSSIN DM MAX ST 10-200 MG/5ML LIQD Take 10 mLs by mouth every 6 (six) hours as needed (for coughing).    [provider]  feeding supplement, GLUCERNA SHAKE, (GLUCERNA SHAKE) LIQD Take 237 mLs by mouth 3 (three) times daily between meals. Patient not taking: Reported on 12/10/2023 05/01/22   Vann, Jessica U, DO  glucose blood (ONETOUCH VERIO) test strip 1 each by Other route 2 (two) times daily. And lancets 2/day 08/03/20   Gwyndolyn Lerner, MD  Lancets [facility] Jewelene Morton PLUS [place]) MISC Test bid 12/28/20   [provider]  LANTUS  SOLOSTAR 100 UNIT/ML Solostar Pen Inject 20 Units into the skin daily. 09/05/21   [provider]  loperamide  (IMODIUM ) 2 MG capsule Take 2 mg by mouth as needed for  diarrhea or loose stools (CANNOT EXCEED 16 MG/DAY).    [provider]  Nutritional Supplements (NUTRITIONAL SHAKE) LIQD Take 120 mLs by mouth See admin instructions. MightyShake : Drink 120 ml's by mouth three times a day with meals    [provider]  ondansetron  (ZOFRAN ) 4 MG tablet Take 4 mg by mouth every 6 (six) hours as needed for nausea or vomiting.    [provider]  REFRESH OPTIVE 1-0.9 % GEL Place 1 application  into both eyes 2 (two) times daily as needed (for dryness).    [provider]  RESTASIS  0.05 % ophthalmic emulsion Place 1 drop into both eyes 2 (two) times daily. 01/29/23   [provider]  risperiDONE  (RISPERDAL ) 1 MG/ML oral solution Take 1 mL (1 mg total) by mouth 2 (two) times daily. 05/01/22   Vann, Jessica U, DO  rosuvastatin  (CRESTOR ) 40 MG tablet Take 1 tablet (40 mg total) by mouth daily. 12/13/23 12/22/24  Singh, Prashant K, MD    Allergies: Aspirin and Penicillins    Review of Systems  All other systems reviewed and are negative.   Updated Vital Signs BP (!) 141/63 (BP Location: Left Arm)   Pulse 95   Temp 99.8 F (37.7 C) (Oral)   Resp 13   SpO2 95%   Physical Exam Vitals and nursing note reviewed.  Constitutional:      General: She is not in acute distress.  Appearance: She is well-developed.     Comments: Alert, pleasantly demented, minimally interactive  HENT:     Head: Normocephalic and atraumatic.   Eyes:     Conjunctiva/sclera: Conjunctivae normal.     Pupils: Pupils are equal, round, and reactive to light.    Cardiovascular:     Rate and Rhythm: Normal rate and regular rhythm.     Heart sounds: Normal heart sounds.  Pulmonary:     Effort: Pulmonary effort is normal. No respiratory distress.     Breath sounds: Normal breath sounds.  Abdominal:     General: There is no distension.     Palpations: Abdomen is soft.     Tenderness: There is no abdominal tenderness.   Musculoskeletal:         General: Swelling present. No deformity. Normal range of motion.     Cervical back: Normal range of motion and neck supple.     Comments: 2+ bilateral lower extremity edema.  Slight erythema to the anterior tibial skin bilaterally.  See images below.   Skin:    General: Skin is warm and dry.   Neurological:     General: No focal deficit present.     Mental Status: She is alert and oriented to person, place, and time.     (all labs ordered are listed, but only abnormal results are displayed) Labs Reviewed  CULTURE, BLOOD (ROUTINE X 2)  CULTURE, BLOOD (ROUTINE X 2)  RESP PANEL BY RT-PCR (RSV, FLU A&B, COVID)  RVPGX2  CBC WITH DIFFERENTIAL/PLATELET  COMPREHENSIVE METABOLIC PANEL WITH GFR  URINALYSIS, W/ REFLEX TO CULTURE (INFECTION SUSPECTED)  BRAIN NATRIURETIC PEPTIDE  I-STAT CG4 LACTIC ACID, ED  TROPONIN I (HIGH SENSITIVITY)    EKG: None  Radiology: No results found.   Procedures   Medications Ordered in the ED - No data to display                                  Medical Decision Making Amount and/or Complexity of Data Reviewed Labs: ordered. Radiology: ordered.  Risk Decision regarding hospitalization.    Medical Screen Complete  This patient presented to the ED with complaint of fever, AMS, cough   This complaint involves an extensive number of treatment options. The initial differential diagnosis includes, but is not limited to, pneumonia, infection, metabolic abnormality, etc.  This presentation is: Acute, Chronic, Self-Limited, Previously Undiagnosed, Uncertain Prognosis, Complicated, Systemic Symptoms, and Threat to Life/Bodily Function  Patient presents from her facility for evaluation of cough, congestion, increased weakness, decreased level of consciousness.  Workup suggest likely pneumonia.  Antibiotics administered in the ED.  Patient would benefit from admission.  Hospitalist service made aware of case.   Additional history  obtained: External records from outside sources obtained and reviewed including prior ED visits and prior Inpatient records.   Problem List / ED Course:  Fever, pneumonia   Disposition:  After consideration of the diagnostic results and the patients response to treatment, I feel that the patent would benefit from admission.       Final diagnoses:  Fever, unspecified fever cause  Pneumonia due to infectious organism, unspecified laterality, unspecified part of lung    ED Discharge Orders     None          Burnette Carte, MD 02/03/24 1147

## 2024-02-03 NOTE — ED Notes (Signed)
Updated patient's daughter on patient status.

## 2024-02-03 NOTE — ED Triage Notes (Addendum)
 Pt presents to the ED via EMS from SNF with complaints of weakness , cough and congestion since yesterday noticed by staff. Pt has hx of dementia.    EMS Vitals:   O2 92 RA , 98% 3L  T: 100.6 F  - 650mg  Tylenol  administered   CBG 110 BP 158/52 HR 90

## 2024-02-03 NOTE — ED Notes (Signed)
 Offered patient something to eat and she stated that she isn't hungry right now.

## 2024-02-03 NOTE — ED Notes (Signed)
 Patient changed and new brief was placed.

## 2024-02-04 DIAGNOSIS — I5033 Acute on chronic diastolic (congestive) heart failure: Secondary | ICD-10-CM | POA: Diagnosis not present

## 2024-02-04 DIAGNOSIS — J189 Pneumonia, unspecified organism: Secondary | ICD-10-CM

## 2024-02-04 LAB — GLUCOSE, CAPILLARY
Glucose-Capillary: 94 mg/dL (ref 70–99)
Glucose-Capillary: 95 mg/dL (ref 70–99)
Glucose-Capillary: 180 mg/dL — ABNORMAL HIGH (ref 70–99)
Glucose-Capillary: 157 mg/dL — ABNORMAL HIGH (ref 70–99)

## 2024-02-04 LAB — COMPREHENSIVE METABOLIC PANEL WITH GFR
ALT: 12 U/L (ref 0–44)
AST: 22 U/L (ref 15–41)
Albumin: 2.9 g/dL — ABNORMAL LOW (ref 3.5–5.0)
Alkaline Phosphatase: 82 U/L (ref 38–126)
Anion gap: 13 (ref 5–15)
BUN: 23 mg/dL (ref 8–23)
CO2: 22 mmol/L (ref 22–32)
Calcium: 8.4 mg/dL — ABNORMAL LOW (ref 8.9–10.3)
Chloride: 101 mmol/L (ref 98–111)
Creatinine, Ser: 1.56 mg/dL — ABNORMAL HIGH (ref 0.44–1.00)
GFR, Estimated: 32 mL/min — ABNORMAL LOW (ref >60)
Glucose, Bld: 117 mg/dL — ABNORMAL HIGH (ref 70–99)
Potassium: 4 mmol/L (ref 3.5–5.1)
Sodium: 136 mmol/L (ref 135–145)
Total Bilirubin: 0.5 mg/dL (ref 0.0–1.2)
Total Protein: 6.5 g/dL (ref 6.5–8.1)

## 2024-02-04 LAB — CBC
HCT: 33.2 % — ABNORMAL LOW (ref 36.0–46.0)
Hemoglobin: 10 g/dL — ABNORMAL LOW (ref 12.0–15.0)
MCH: 27.5 pg (ref 26.0–34.0)
MCHC: 30.1 g/dL (ref 30.0–36.0)
MCV: 91.5 fL (ref 80.0–100.0)
Platelets: 213 10*3/uL (ref 150–400)
RBC: 3.63 MIL/uL — ABNORMAL LOW (ref 3.87–5.11)
RDW: 14.1 % (ref 11.5–15.5)
WBC: 4.6 10*3/uL (ref 4.0–10.5)
nRBC: 0 % (ref 0.0–0.2)

## 2024-02-04 LAB — PROCALCITONIN: Procalcitonin: 0.1 ng/mL

## 2024-02-04 MED ORDER — FUROSEMIDE 20 MG PO TABS
20.0000 mg | ORAL_TABLET | Freq: Every day | ORAL | Status: DC
  Administered 2024-02-04 – 2024-02-05 (×2): 20 mg via ORAL
  Filled 2024-02-04 (×2): qty 1

## 2024-02-04 NOTE — Care Management Obs Status (Signed)
 MEDICARE OBSERVATION STATUS NOTIFICATION   Patient Details  Name: Gail Lynch MRN: [id number] Date of Birth: 1941/05/13   Medicare Observation Status Notification Given:  Other (see comment) (Mailed via certified mail to legal guardian: Danney Dutton [facility], [place], [place] [address]))    Zenon Hilda, LCSW 02/04/2024, 10:12 AM

## 2024-02-04 NOTE — Progress Notes (Signed)
 Progress Note   Patient: Gail Lynch ZOX:[id number] DOB: [birth year] DOA: 02/03/2024     0 DOS: the patient was seen and examined on 02/04/2024   Brief hospital course: 83 y.o. female with medical history significant of lower GI bleed, gout, osteoarthritis, asthma, carotid stenosis, colon polyps, type 2 diabetes, HPV infection, stage I CIN treated with cervical conization, glaucoma, impaired hearing, hyperlipidemia, hypertension, intrinsic asthma, mild aortic stenosis, onychomycosis, dementia who was brought from her skilled nursing facility due to the staff noticing generalized weakness, cough and congestion since yesterday.  She is able to answer simple questions.  No dyspnea, headache, chest or abdominal pain at this time.   Assessment and Plan: Acute on chronic diastolic congestive heart failure (HCC) Continue diuresis with  Supplemental oxygen  as needed. Sodium and fluid restriction. Continue furosemide 20 mg PO daily Hold beta-blocker due to acute decompensation. No ACE inhibitor due to renal insufficiency. Monitor daily weights, intake and output. Monitor renal function electrolytes. Echocardiogram done recently. DC abx due to negative PCT      Hypomagnesemia  Replaced. Follow up level as needed.     Hypertension Continue hydralazine  25 mg p.o. 3 times daily.     Hyperlipidemia Not on statin Follow-up with PCP.     Glaucoma Continue eyedrops. Follow-up with ophthalmology as an outpatient.     Peripheral neuropathy Blood glucose control.     Normocytic anemia Monitor hematocrit and hemoglobin.     Dementia Continue risperidone  1 mg p.o. twice daily.         Subjective: No acute complaints, per RN no acute overnight events.  Physical Exam: Vitals:   02/04/24 0458 02/04/24 0701 02/04/24 1026 02/04/24 1249  BP: 132/79  134/69 (!) 111/59  Pulse: 89  76 76  Resp:   18 15  Temp: 98.5 F (36.9 C)  98.1 F (36.7 C) 98.4 F (36.9 C)  TempSrc: Oral  Oral  Oral  SpO2: 100%  91% 92%  Weight:  86 kg     Vitals and nursing note reviewed.  Constitutional:      General: She is not in acute distress.    Appearance: She is well-developed.     Comments:Pleasantly confused HENT:     Head: Normocephalic and atraumatic.    Eyes:     Conjunctiva/sclera: Conjunctivae normal.     Pupils: Pupils are equal, round, and reactive to light.      Cardiovascular:     Rate and Rhythm: Normal rate and regular rhythm.     Heart sounds: Normal heart sounds.  Pulmonary:     Effort: Pulmonary effort is normal. No respiratory distress.     Breath sounds: Normal breath sounds.  Abdominal:     General: There is no distension.     Palpations: Abdomen is soft.     Tenderness: There is no abdominal tenderness.    Musculoskeletal:        General: Swelling present. No deformity. Normal range of motion.     Cervical back: Normal range of motion and neck supple.     Comments: 2+ bilateral lower extremity edema.  Slight erythema to the anterior tibial skin bilaterally.  See images below.    Skin:    General: Skin is warm and dry.    Neurological:     General: No focal deficit present.     Mental Status: She is alert and oriented to person, place, and time.  Time spent: 35 minutes  Author: Volney Grumbles, MD 02/04/2024 2:31 PM  For on call review [email or web address].

## 2024-02-04 NOTE — Progress Notes (Signed)
 Legal guardian, Danney Dutton called this am. RN returned call [phone]  to provide an update regarding patient current plan of care.She denied additional questions or concerns at this time.  Legal guardian to provide update to skilled nursing facility.

## 2024-02-04 NOTE — Progress Notes (Signed)
 Heart Failure Navigator Progress Note  Assessed for Heart & Vascular TOC clinic readiness.  Patient does not meet criteria due to EF 55-60%, per MD note patient with History of Dementia. .   Navigator will sign off at this time.   Randie Bustle, BSN, Scientist, clinical (histocompatibility and immunogenetics) Only

## 2024-02-04 NOTE — TOC Initial Note (Addendum)
 Transition of Care [facility]) - Initial/Assessment Note   Patient Details  Name: Gail Lynch MRN: [id number] Date of Birth: 12/24/1940  [facility]) CM/SW Contact:    Zenon Hilda, LCSW Phone Number: 02/04/2024, 10:55 AM  Clinical Narrative: [facility] consulted for heart failure screening. However, patient was screened out by the heart failure navigation team so consult will be cleared at this time.  Patient is from [facility]. Patient is oriented to self only. CSW spoke with patient's legal guardian, Danney Dutton, regarding discharge planning. Per Ms. Weeks, the discharge paperwork will need to be emailed to her ([email or web address]) as well as sent to [employer]. CSW left voicemail for Mia Adam at [facility] requesting call back. FL2 started. TOC to follow.  Addendum: CSW notified by Cody Das with [facility] that patient is active for HHPT/OT.  Expected Discharge Plan: Assisted Living Barriers to Discharge: Continued Medical Work up  Patient Goals and CMS Choice Patient states their goals for this hospitalization and ongoing recovery are:: Return to [facility] per legal guardian Choice offered to / list presented to : NA  Expected Discharge Plan and Services In-house Referral: Clinical Social Work Post Acute Care Choice: NA Living arrangements for the past 2 months: Assisted Living Facility           DME Arranged: N/A DME Agency: NA  Prior Living Arrangements/Services Living arrangements for the past 2 months: Assisted Living Facility Lives with:: Facility Resident Patient language and need for interpreter reviewed:: Yes Do you feel safe going back to the place where you live?: Yes      Need for Family Participation in Patient Care: Yes (Comment) (Patient has legal guardian and is oriented to self only.) Care giver support system in place?: Yes (comment) Current home services: DME [facility]) Criminal Activity/Legal Involvement Pertinent to Current  Situation/Hospitalization: No - Comment as needed  Permission Sought/Granted Permission sought to share information with : Guardian  Emotional Assessment Attitude/Demeanor/Rapport: Unable to Assess Affect (typically observed): Unable to Assess Orientation: : Oriented to Self Alcohol / Substance Use: Not Applicable Psych Involvement: No (comment)  Admission diagnosis:  Acute on chronic diastolic congestive heart failure (HCC) [I50.33] Fever, unspecified fever cause [R50.9] Pneumonia due to infectious organism, unspecified laterality, unspecified part of lung [J18.9] Patient Active Problem List   Diagnosis Date Noted   Hypomagnesemia 02/04/2024   Acute on chronic diastolic congestive heart failure (HCC) 02/03/2024   Normocytic anemia 02/03/2024   Stroke-like symptom 12/10/2023   Hemorrhoids 05/01/2022   Pneumonia 04/26/2022   Hypokalemia 04/25/2022   COVID-19 virus infection 04/25/2022   CAP (community acquired pneumonia) 04/25/2022   Anemia 04/25/2022   AKI (acute kidney injury) (HCC) 04/25/2022   Sacral wound 04/25/2022   Living in nursing home 09/13/2021   Involuntary commitment 09/13/2021   Severe CADASIL dementia with agitation (HCC) 09/13/2021   Dementia (HCC) 07/04/2021   Non compliance with medical treatment 07/31/2020   Psychosis in elderly (HCC) 07/29/2020   Diabetic leg ulcer (HCC) 07/29/2020   Benign neoplasm of ascending colon    Benign neoplasm of transverse colon    Benign neoplasm of descending colon    Benign neoplasm of sigmoid colon    History of colon polyps 05/04/2020   Presence of cardiac pacemaker 03/19/2020   Complete AV block (HCC) 03/09/2020   Mild aortic stenosis 03/14/2017   Peripheral edema 03/14/2017   Arm heaviness 03/14/2017   CKD (chronic kidney disease), stage III (HCC) 05/20/2016   Uncontrolled type 2 diabetes mellitus with  hypoglycemia, with long-term current use of insulin  (HCC) 01/03/2016   Dysuria 12/09/2014   Peripheral neuropathy  12/05/2013   Irregular heart beat 12/05/2013   PVC (premature ventricular contraction) 12/05/2013   Sciatica 06/17/2013   Pain due to onychomycosis of toenail 05/21/2013   Mycotic toenails    Vagina bleeding 08/06/2012   Carotid stenosis 06/18/2012   Constipation 04/30/2012   Childhood asthma 12/18/2011   Glaucoma 12/18/2011   Hyperlipidemia 12/18/2011   Colon polyps 12/18/2011   Preventative health care 12/17/2011   Hypertension 12/17/2011   Hard of hearing    Dysplasia of cervix, low grade (CIN 1)    HPV (human papilloma virus) infection    Brain mass 09/18/2011   PCP:  Glynn Lasso, PA-C Pharmacy:  No Pharmacies Listed  Social Drivers of Health (SDOH) Social History: SDOH Screenings   Food Insecurity: No Food Insecurity (12/12/2023)  Housing: Low Risk  (04/26/2022)  Transportation Needs: No Transportation Needs (04/26/2022)  Utilities: Not At Risk (04/26/2022)  Depression (PHQ2-9): Low Risk  (03/18/2020)  Tobacco Use: Medium Risk (12/10/2023)   SDOH Interventions:    Readmission Risk Interventions    05/01/2022   12:01 PM  Readmission Risk Prevention Plan  Transportation Screening Complete  PCP or Specialist Appt within 3-5 Days Complete  HRI or Home Care Consult Complete  Social Work Consult for Recovery Care Planning/Counseling Complete  Palliative Care Screening Not Applicable  Medication Review Oceanographer) Complete

## 2024-02-05 ENCOUNTER — Encounter (HOSPITAL_COMMUNITY): Payer: Self-pay | Admitting: *Deleted

## 2024-02-05 DIAGNOSIS — I5033 Acute on chronic diastolic (congestive) heart failure: Secondary | ICD-10-CM | POA: Diagnosis not present

## 2024-02-05 DIAGNOSIS — J189 Pneumonia, unspecified organism: Secondary | ICD-10-CM | POA: Diagnosis not present

## 2024-02-05 LAB — GLUCOSE, CAPILLARY
Glucose-Capillary: 141 mg/dL — ABNORMAL HIGH (ref 70–99)
Glucose-Capillary: 191 mg/dL — ABNORMAL HIGH (ref 70–99)
Glucose-Capillary: 181 mg/dL — ABNORMAL HIGH (ref 70–99)
Glucose-Capillary: 165 mg/dL — ABNORMAL HIGH (ref 70–99)

## 2024-02-05 MED ORDER — FUROSEMIDE 10 MG/ML IJ SOLN
20.0000 mg | Freq: Every day | INTRAMUSCULAR | Status: DC
  Administered 2024-02-05 – 2024-02-06 (×2): 20 mg via INTRAVENOUS
  Filled 2024-02-05 (×2): qty 2

## 2024-02-05 NOTE — Progress Notes (Signed)
 Patient more alert this shift, oriented to place and self. Daughter, Gail Lynch, here to visit, voiced that patient had been mobilizing to [facility] at rehab with PT. Patient able to transfer to chair with 1 assist and walker. Weak and slightly unsteady but improved with walker. Purewick removed as she is able to transfer to [facility] and continent as well.

## 2024-02-05 NOTE — Progress Notes (Signed)
 Progress Note   Patient: Gail Lynch ZOX:[id number] DOB: 1941/03/08 DOA: 02/03/2024     0 DOS: the patient was seen and examined on 02/05/2024   Brief hospital course: 83 y.o. female with medical history significant of lower GI bleed, gout, osteoarthritis, asthma, carotid stenosis, colon polyps, type 2 diabetes, HPV infection, stage I CIN treated with cervical conization, glaucoma, impaired hearing, hyperlipidemia, hypertension, intrinsic asthma, mild aortic stenosis, onychomycosis, dementia who was brought from her skilled nursing facility due to the staff noticing generalized weakness, cough and congestion since yesterday.  She is able to answer simple questions.  No dyspnea, headache, chest or abdominal pain at this time.     Assessment and Plan: Acute on chronic diastolic congestive heart failure (HCC) Continue diuresis with  Supplemental oxygen  as needed. Sodium and fluid restriction. Continue furosemide 20 mg IV daily, monitor serum creatinine Hold beta-blocker due to acute decompensation. No ACE inhibitor due to renal insufficiency. Monitor daily weights, intake and output. Monitor renal function electrolytes. Echocardiogram done recently. DC abx due to negative PCT     Hypomagnesemia  Replaced. Follow up level as needed.     Hypertension Continue hydralazine  25 mg p.o. 3 times daily.     Hyperlipidemia Not on statin Follow-up with PCP.     Glaucoma Continue eyedrops. Follow-up with ophthalmology as an outpatient.     Peripheral neuropathy Blood glucose control.     Normocytic anemia Monitor hematocrit and hemoglobin.     Dementia Continue risperidone  1 mg p.o. twice daily.   Subjective: Patient seen and evaluated at bedside, she feels so much better however she still has bilateral lower extremity edema which has improved compared to time of admission.  Physical Exam: Vitals:   02/04/24 2104 02/04/24 2244 02/05/24 0418 02/05/24 1114  BP: (!) 124/58 (!)  124/58 (!) 123/59   Pulse: 61  76   Resp:      Temp: 97.8 F (36.6 C)  97.8 F (36.6 C)   TempSrc: Oral  Oral   SpO2: 97%  100%   Weight:   85.1 kg   Height:    5' 4 (1.626 m)  Vitals and nursing note reviewed.  Constitutional:      General: She is not in acute distress.    Appearance: She is well-developed.     Comments:Pleasantly confused HENT:     Head: Normocephalic and atraumatic.    Eyes:     Conjunctiva/sclera: Conjunctivae normal.     Pupils: Pupils are equal, round, and reactive to light.      Cardiovascular:     Rate and Rhythm: Normal rate and regular rhythm.     Heart sounds: Normal heart sounds.  Pulmonary:     Effort: Pulmonary effort is normal. No respiratory distress.     Breath sounds: Normal breath sounds.  Abdominal:     General: There is no distension.     Palpations: Abdomen is soft.     Tenderness: There is no abdominal tenderness.    Musculoskeletal:        General: Swelling present. No deformity. Normal range of motion.     Cervical back: Normal range of motion and neck supple.     Comments: 2+ bilateral lower extremity edema.  Slight erythema to the anterior tibial skin bilaterally.    Skin:    General: Skin is warm and dry.    Neurological:     General: No focal deficit present.     Mental Status: She is alert and oriented  to person, place, and time.   Expected date of discharge tomorrow 01/1819 25   Time spent: 35 minutes  Author: Volney Grumbles, MD 02/05/2024 12:33 PM  For on call review [email or web address].

## 2024-02-06 DIAGNOSIS — J189 Pneumonia, unspecified organism: Secondary | ICD-10-CM | POA: Diagnosis not present

## 2024-02-06 DIAGNOSIS — I5033 Acute on chronic diastolic (congestive) heart failure: Secondary | ICD-10-CM | POA: Diagnosis not present

## 2024-02-06 LAB — COMPREHENSIVE METABOLIC PANEL WITH GFR
ALT: 14 U/L (ref 0–44)
AST: 17 U/L (ref 15–41)
Albumin: 2.7 g/dL — ABNORMAL LOW (ref 3.5–5.0)
Alkaline Phosphatase: 80 U/L (ref 38–126)
Anion gap: 11 (ref 5–15)
BUN: 35 mg/dL — ABNORMAL HIGH (ref 8–23)
CO2: 24 mmol/L (ref 22–32)
Calcium: 8.4 mg/dL — ABNORMAL LOW (ref 8.9–10.3)
Chloride: 103 mmol/L (ref 98–111)
Creatinine, Ser: 1.67 mg/dL — ABNORMAL HIGH (ref 0.44–1.00)
GFR, Estimated: 30 mL/min — ABNORMAL LOW (ref >60)
Glucose, Bld: 65 mg/dL — ABNORMAL LOW (ref 70–99)
Potassium: 3.7 mmol/L (ref 3.5–5.1)
Sodium: 138 mmol/L (ref 135–145)
Total Bilirubin: 0.7 mg/dL (ref 0.0–1.2)
Total Protein: 6.4 g/dL — ABNORMAL LOW (ref 6.5–8.1)

## 2024-02-06 LAB — CBC
HCT: 32.2 % — ABNORMAL LOW (ref 36.0–46.0)
Hemoglobin: 10.2 g/dL — ABNORMAL LOW (ref 12.0–15.0)
MCH: 27.6 pg (ref 26.0–34.0)
MCHC: 31.7 g/dL (ref 30.0–36.0)
MCV: 87.3 fL (ref 80.0–100.0)
Platelets: 255 10*3/uL (ref 150–400)
RBC: 3.69 MIL/uL — ABNORMAL LOW (ref 3.87–5.11)
RDW: 14.1 % (ref 11.5–15.5)
WBC: 5.7 10*3/uL (ref 4.0–10.5)
nRBC: 0 % (ref 0.0–0.2)

## 2024-02-06 LAB — GLUCOSE, CAPILLARY
Glucose-Capillary: 91 mg/dL (ref 70–99)
Glucose-Capillary: 163 mg/dL — ABNORMAL HIGH (ref 70–99)
Glucose-Capillary: 194 mg/dL — ABNORMAL HIGH (ref 70–99)

## 2024-02-06 NOTE — Progress Notes (Signed)
 Report called and given to [facility].

## 2024-02-06 NOTE — Discharge Summary (Signed)
 Physician Discharge Summary   Patient: Gail Lynch MRN: [id number] DOB: 30-Aug-1940  Admit date:     02/03/2024  Discharge date: 02/06/24  Discharge Physician: Vernona Goods Cristianna Cyr   PCP: Glynn Lasso, PA-C   Recommendations at discharge:   Follow-up with primary care physician  Discharge Diagnoses: Principal Problem:   Acute on chronic diastolic congestive heart failure (HCC) Active Problems:   Hypertension   Hyperlipidemia   Glaucoma   Peripheral neuropathy   Normocytic anemia   Hypomagnesemia  Resolved Problems:   * No resolved hospital problems. *  Hospital Course: 83 y.o. female with medical history significant of lower GI bleed, gout, osteoarthritis, asthma, carotid stenosis, colon polyps, type 2 diabetes, HPV infection, stage I CIN treated with cervical conization, glaucoma, impaired hearing, hyperlipidemia, hypertension, intrinsic asthma, mild aortic stenosis, onychomycosis, dementia who was brought from her skilled nursing facility due to the staff noticing generalized weakness, cough and congestion since yesterday.  She is able to answer simple questions.  No dyspnea, headache, chest or abdominal pain at this time.      Assessment and Plan: Acute on chronic diastolic congestive heart failure (HCC) Continue diuresis with Lasix, switch to p.o. at discharge Supplemental oxygen  as needed. Sodium and fluid restriction. Continue beta-blockers Monitor daily weights, intake and output. Monitor renal function electrolytes. Echocardiogram done recently. DC abx due to negative PCT  CKD Stage 3b - Avoid nephrotoxins if possible.  Monitor serum creatinine in setting of Lasix use.     Hypomagnesemia  Replaced. Follow up level as needed.   Hypertension Continue hydralazine  25 mg p.o. 3 times daily. Continue metoprolol   Hyperlipidemia Not on statin Follow-up with PCP.     Glaucoma Continue eyedrops. Follow-up with ophthalmology as an outpatient.      Peripheral neuropathy Blood glucose control.     Normocytic anemia Monitor hematocrit and hemoglobin.     Dementia Continue risperidone  1 mg p.o. twice daily.    DISCHARGE MEDICATION: Allergies as of 02/06/2024       Reactions   Aspirin Other (See Comments)   BLEEDING BEHIND EYES   Penicillins Other (See Comments)   Reaction not cited on MAR        Medication List     TAKE these medications    albuterol  108 (90 Base) MCG/ACT inhaler Commonly known as: VENTOLIN  HFA Inhale 2 puffs into the lungs every 4 (four) hours as needed for wheezing or shortness of breath.   clopidogrel  75 MG tablet Commonly known as: PLAVIX  Take 1 tablet (75 mg total) by mouth daily.   Diabetic Tussin DM Max St 10-200 MG/5ML Liqd Generic drug: Dextromethorphan -guaiFENesin  Take 10 mLs by mouth every 6 (six) hours as needed (for coughing).   Nutritional Shake Liqd Take 120 mLs by mouth See admin instructions. MightyShake : Drink 120 ml's by mouth three times a day with meals   feeding supplement (GLUCERNA SHAKE) Liqd Take 237 mLs by mouth 3 (three) times daily between meals.   furosemide 20 MG tablet Commonly known as: LASIX Take 20 mg by mouth daily.   hydrALAZINE  25 MG tablet Commonly known as: APRESOLINE  Take 25 mg by mouth 3 (three) times daily.   Lantus  SoloStar 100 UNIT/ML Solostar Pen Generic drug: insulin  glargine Inject 24 Units into the skin daily.   loperamide  2 MG capsule Commonly known as: IMODIUM  Take 2 mg by mouth as needed for diarrhea or loose stools (CANNOT EXCEED 16 MG/DAY).   ondansetron  4 MG tablet Commonly known as: ZOFRAN  Take 4  mg by mouth every 6 (six) hours as needed for nausea or vomiting.   OneTouch Delica Plus Lancet33G Misc Test bid   OneTouch Verio test strip Generic drug: glucose blood 1 each by Other route 2 (two) times daily. And lancets 2/day   Refresh Optive 1-0.9 % Gel Generic drug: Carboxymethylcellul-Glycerin Place 1 application  into  both eyes 2 (two) times daily as needed (for dryness).   Restasis  0.05 % ophthalmic emulsion Generic drug: cycloSPORINE  Place 1 drop into both eyes 2 (two) times daily.   risperiDONE  1 MG/ML oral solution Commonly known as: RISPERDAL  Take 1 mL (1 mg total) by mouth 2 (two) times daily.   rosuvastatin  40 MG tablet Commonly known as: Crestor  Take 1 tablet (40 mg total) by mouth daily.        Follow-up Information     [facility] Follow up.   Specialty: Home Health Services Why: [facility] will resume PT and OT after discharge. Contact information: [address] [place] [address] [phone]                Discharge Exam: Filed Weights   02/04/24 0701 02/05/24 0418 02/06/24 0314  Weight: 86 kg 85.1 kg 84.7 kg  Vitals and nursing note reviewed.  Constitutional:      General: She is not in acute distress.    Appearance: She is well-developed.     Comments:Pleasantly confused HENT:     Head: Normocephalic and atraumatic.    Eyes:     Conjunctiva/sclera: Conjunctivae normal.     Pupils: Pupils are equal, round, and reactive to light.      Cardiovascular:     Rate and Rhythm: Normal rate and regular rhythm.     Heart sounds: Normal heart sounds.  Pulmonary:     Effort: Pulmonary effort is normal. No respiratory distress.     Breath sounds: Normal breath sounds.  Abdominal:     General: There is no distension.     Palpations: Abdomen is soft.     Tenderness: There is no abdominal tenderness.    Musculoskeletal:        General: Swelling present. No deformity. Normal range of motion.     Cervical back: Normal range of motion and neck supple.     Comments: 1+ bilateral lower extremity edema.  Slight erythema to the anterior tibial skin bilaterally.    Skin:    General: Skin is warm and dry.    Neurological:     General: No focal deficit present.     Mental Status: She is alert and oriented to person, place, and time.       Condition at discharge: fair  The results of significant diagnostics from this hospitalization (including imaging, microbiology, ancillary and laboratory) are listed below for reference.   Imaging Studies: CT CHEST WO CONTRAST Result Date: 02/03/2024 CLINICAL DATA:  Respiratory illness, nondiagnostic xray EXAM: CT CHEST WITHOUT CONTRAST TECHNIQUE: Multidetector CT imaging of the chest was performed following the standard protocol without IV contrast. RADIATION DOSE REDUCTION: This exam was performed according to the departmental dose-optimization program which includes automated exposure control, adjustment of the mA and/or kV according to patient size and/or use of iterative reconstruction technique. COMPARISON:  April 25, 2022 FINDINGS: Cardiovascular: Heart is mildly enlarged. Three-vessel coronary artery atherosclerotic calcifications. LEFT chest cardiac pacing device. Trace pericardial fluid. Atherosclerotic calcifications of the thoracic aorta. Motion limits 6 Mediastinum/Nodes: Visualized thyroid  is unremarkable. No axillary adenopathy. Evaluation for mediastinal adenopathy is  limited by motion. Calcified subcarinal lymph node. Lungs/Pleura: Small LEFT pleural effusion. Diffuse bronchial wall thickening with multifocal intraluminal debris most predominant in the lower lungs. LEFT basilar and lingular consolidative opacity. Scattered centrilobular nodularity throughout the LEFT upper lobe. More focal pulmonary nodule measures approximately 7 mm (series 4, image 48). Upper Abdomen: Patulous esophagus. Musculoskeletal: Relative preservation of the disc spaces with flowing anterior osteophytes. Chronic compression fracture deformity of T12. IMPRESSION: 1. Diffuse bronchial wall thickening with scattered areas of consolidative and centrilobular opacities. Findings may reflect an atypical infection with more focal areas of atelectasis/infection versus pulmonary edema with more focal areas of  atelectasis/infection/aspiration. 2. Small LEFT pleural effusion. 3. Given more focal nodularity, recommend follow-up noncontrast chest CT at 6-12 months. Aortic Atherosclerosis (ICD10-I70.0). Electronically Signed   By: Clancy Crimes M.D.   On: 02/03/2024 10:43   DG Chest Port 1 View Result Date: 02/03/2024 CLINICAL DATA:  Fever EXAM: PORTABLE CHEST 1 VIEW COMPARISON:  12/08/2023. FINDINGS: Prominent cardiac silhouette. Left base consolidation with evidence of small left-sided pleural effusion. Normal pulmonary vasculature. Calcified aorta. No pneumothorax. Left-sided pacer. IMPRESSION: Left base consolidation and small effusion. Electronically Signed   By: Sydell Eva M.D.   On: 02/03/2024 09:30   CUP PACEART REMOTE DEVICE CHECK Result Date: 01/10/2024 PPM scheduled remote reviewed. Normal device function.  Presenting rhythm: AS-VP Next remote 91 days. AB, CVRS   Microbiology: Results for orders placed or performed during the hospital encounter of 02/03/24  Resp panel by RT-PCR (RSV, Flu A&B, Covid) Anterior Nasal Swab     Status: None   Collection Time: 02/03/24  9:03 AM   Specimen: Anterior Nasal Swab  Result Value Ref Range Status   SARS Coronavirus 2 by RT PCR NEGATIVE NEGATIVE Final    Comment: (NOTE) SARS-CoV-2 target nucleic acids are NOT DETECTED.  The SARS-CoV-2 RNA is generally detectable in upper respiratory specimens during the acute phase of infection. The lowest concentration of SARS-CoV-2 viral copies this assay can detect is 138 copies/mL. A negative result does not preclude SARS-Cov-2 infection and should not be used as the sole basis for treatment or other patient management decisions. A negative result may occur with  improper specimen collection/handling, submission of specimen other than nasopharyngeal swab, presence of viral mutation(s) within the areas targeted by this assay, and inadequate number of viral copies(<138 copies/mL). A negative result must  be combined with clinical observations, patient history, and epidemiological information. The expected result is Negative.  Fact Sheet for Patients:  BloggerCourse.com  Fact Sheet for Healthcare Providers:  SeriousBroker.it  This test is no t yet approved or cleared by the [place]  FDA and  has been authorized for detection and/or diagnosis of SARS-CoV-2 by FDA under an Emergency Use Authorization (EUA). This EUA will remain  in effect (meaning this test can be used) for the duration of the COVID-19 declaration under Section 564(b)(1) of the Act, 21 U.S.C.section 360bbb-3(b)(1), unless the authorization is terminated  or revoked sooner.       Influenza A by PCR NEGATIVE NEGATIVE Final   Influenza B by PCR NEGATIVE NEGATIVE Final    Comment: (NOTE) The Xpert Xpress SARS-CoV-2/FLU/RSV plus assay is intended as an aid in the diagnosis of influenza from Nasopharyngeal swab specimens and should not be used as a sole basis for treatment. Nasal washings and aspirates are unacceptable for Xpert Xpress SARS-CoV-2/FLU/RSV testing.  Fact Sheet for Patients: BloggerCourse.com  Fact Sheet for Healthcare Providers: SeriousBroker.it  This test is not yet approved or  cleared by the [place]  FDA and has been authorized for detection and/or diagnosis of SARS-CoV-2 by FDA under an Emergency Use Authorization (EUA). This EUA will remain in effect (meaning this test can be used) for the duration of the COVID-19 declaration under Section 564(b)(1) of the Act, 21 U.S.C. section 360bbb-3(b)(1), unless the authorization is terminated or revoked.     Resp Syncytial Virus by PCR NEGATIVE NEGATIVE Final    Comment: (NOTE) Fact Sheet for Patients: BloggerCourse.com  Fact Sheet for Healthcare Providers: SeriousBroker.it  This test is not  yet approved or cleared by the [place]  FDA and has been authorized for detection and/or diagnosis of SARS-CoV-2 by FDA under an Emergency Use Authorization (EUA). This EUA will remain in effect (meaning this test can be used) for the duration of the COVID-19 declaration under Section 564(b)(1) of the Act, 21 U.S.C. section 360bbb-3(b)(1), unless the authorization is terminated or revoked.  Performed at [facility], [address] [place], [place] [address]   Culture, blood (routine x 2)     Status: None (Preliminary result)   Collection Time: 02/03/24  9:09 AM   Specimen: BLOOD  Result Value Ref Range Status   Specimen Description   Final    BLOOD RIGHT ANTECUBITAL Performed at [facility], [address], [place], [place] [address]    Special Requests   Final    BOTTLES DRAWN AEROBIC ONLY Blood Culture adequate volume Performed at [facility], [address], [place], [place] [address]    Culture   Final    NO GROWTH 3 DAYS Performed at [facility], [address], [place], [place] [address]    Report Status PENDING  Incomplete  Culture, blood (routine x 2)     Status: None (Preliminary result)   Collection Time: 02/03/24  9:09 AM   Specimen: BLOOD  Result Value Ref Range Status   Specimen Description   Final    BLOOD LEFT ANTECUBITAL Performed at [facility], [address] [place], [place] [address]    Special Requests   Final    BOTTLES DRAWN AEROBIC ONLY Blood Culture adequate volume Performed at [facility], [address] [place], [place] [address]    Culture   Final    NO GROWTH 3 DAYS Performed at [facility], [address], [place], [place] [address]    Report Status PENDING  Incomplete    Labs: CBC: Recent Labs  Lab 02/03/24 1025 02/04/24 0601 02/06/24 0351  WBC 5.0 4.6 5.7  NEUTROABS 3.0  --   --   HGB 9.5* 10.0* 10.2*  HCT  30.8* 33.2* 32.2*  MCV 88.8 91.5 87.3  PLT 227 213 255   Basic Metabolic Panel: Recent Labs  Lab 02/03/24 0923 02/03/24 1025 02/04/24 0601 02/06/24 0351  NA 139  --  136 138  K 3.6  --  4.0 3.7  CL 104  --  101 103  CO2 24  --  22 24  GLUCOSE 129*  --  117* 65*  BUN 19  --  23 35*  CREATININE 1.55*  --  1.56* 1.67*  CALCIUM  8.5*  --  8.4* 8.4*  MG  --  1.5*  --   --   PHOS  --  3.7  --   --    Liver Function Tests: Recent Labs  Lab 02/03/24 0923 02/04/24 0601 02/06/24 0351  AST 13* 22 17  ALT  9 12 14   ALKPHOS 74 82 80  BILITOT 0.6 0.5 0.7  PROT 6.3* 6.5 6.4*  ALBUMIN  3.0* 2.9* 2.7*   CBG: Recent Labs  Lab 02/05/24 1143 02/05/24 1640 02/05/24 2006 02/06/24 0731 02/06/24 1151  GLUCAP 191* 181* 165* 91 163*    Discharge time spent: greater than 30 minutes.  Signed: Volney Grumbles, MD [facility] 02/06/2024

## 2024-02-06 NOTE — TOC Transition Note (Signed)
 Transition of Care [facility]) - Discharge Note  Patient Details  Name: Gail Lynch MRN: [id number] Date of Birth: 12-31-1940  Transition of Care [facility]) CM/SW Contact:  Zenon Hilda, LCSW Phone Number: 02/06/2024, 3:15 PM  Clinical Narrative: Patient is medically ready to return to [facility]. FL2 done. CSW spoke with Jamie at [facility] and confirmed patient can return today. FL2 and discharge summary faxed to [facility] [phone]). CSW emailed discharge paperwork to Danney Dutton ([email or web address]), patient's legal guardian. CSW left voicemails for both Ms. Weeks and Lindi Revering (DSS supervisor) regarding discharge and provided contact information if there were any discharge questions. CSW received return call from Ms. Weeks and update was provided. Medical necessity form done; PTAR scheduled. Discharge packet completed. RN updated. TOC signing off.  Final next level of care: Assisted Living Barriers to Discharge: Barriers Resolved  Patient Goals and CMS Choice Patient states their goals for this hospitalization and ongoing recovery are:: Return to [facility] per legal guardian Choice offered to / list presented to : NA  Discharge Placement        Patient chooses bed at: [facility] Patient to be transferred to facility by: PTAR Name of family member notified: Danney Dutton (legal guardian) Patient and family notified of of transfer: 02/06/24  Discharge Plan and Services Additional resources added to the After Visit Summary for   In-house Referral: Clinical Social Work Post Acute Care Choice: NA          DME Arranged: N/A DME Agency: NA HH Arranged: PT, OT HH Agency: [facility] Date HH Agency Contacted: 02/06/24 Time HH Agency Contacted: 1515 Representative spoke with at [facility] Agency: Loetta Ringer  Social Drivers of Health (SDOH) Interventions SDOH Screenings   Food Insecurity: No Food Insecurity (02/04/2024)  Housing: Low Risk   (02/04/2024)  Transportation Needs: No Transportation Needs (02/04/2024)  Utilities: Not At Risk (02/04/2024)  Depression (PHQ2-9): Low Risk  (03/18/2020)  Social Connections: Unknown (02/04/2024)  Tobacco Use: Medium Risk (02/05/2024)   Readmission Risk Interventions    05/01/2022   12:01 PM  Readmission Risk Prevention Plan  Transportation Screening Complete  PCP or Specialist Appt within 3-5 Days Complete  HRI or Home Care Consult Complete  Social Work Consult for Recovery Care Planning/Counseling Complete  Palliative Care Screening Not Applicable  Medication Review Oceanographer) Complete

## 2024-02-06 NOTE — [place] FL2 (Signed)
 [place]  MEDICAID FL2 LEVEL OF CARE FORM     IDENTIFICATION  Patient Name: Gail Lynch Birthdate: February 14, 1941 Sex: female Admission Date (Current Location): 02/03/2024  [place] and [place] Number:  Ernesto Heady [id number] S Facility and Address:  [facility],  [address], [place] [address]      Provider Number: [id number]  Attending Physician Name and Address:  Dibia, Vernona Goods, MD  Relative Name and Phone Number:  Danney Dutton (Legal Guardian) Ph: [phone]    Current Level of Care: Hospital Recommended Level of Care: Assisted Living Facility [facility]) Prior Approval Number:    Date Approved/Denied:   PASRR Number:    Discharge Plan: Other (Comment) ([facility])    Current Diagnoses: Patient Active Problem List   Diagnosis Date Noted   Hypomagnesemia 02/04/2024   Acute on chronic diastolic congestive heart failure (HCC) 02/03/2024   Normocytic anemia 02/03/2024   Stroke-like symptom 12/10/2023   Hemorrhoids 05/01/2022   Pneumonia 04/26/2022   Hypokalemia 04/25/2022   COVID-19 virus infection 04/25/2022   CAP (community acquired pneumonia) 04/25/2022   Anemia 04/25/2022   AKI (acute kidney injury) (HCC) 04/25/2022   Sacral wound 04/25/2022   Living in nursing home 09/13/2021   Involuntary commitment 09/13/2021   Severe CADASIL dementia with agitation (HCC) 09/13/2021   Dementia (HCC) 07/04/2021   Non compliance with medical treatment 07/31/2020   Psychosis in elderly (HCC) 07/29/2020   Diabetic leg ulcer (HCC) 07/29/2020   Benign neoplasm of ascending colon    Benign neoplasm of transverse colon    Benign neoplasm of descending colon    Benign neoplasm of sigmoid colon    History of colon polyps 05/04/2020   Presence of cardiac pacemaker 03/19/2020   Complete AV block (HCC) 03/09/2020   Mild aortic stenosis 03/14/2017   Peripheral edema 03/14/2017   Arm heaviness 03/14/2017   CKD (chronic kidney disease), stage III (HCC)  05/20/2016   Uncontrolled type 2 diabetes mellitus with hypoglycemia, with long-term current use of insulin  (HCC) 01/03/2016   Dysuria 12/09/2014   Peripheral neuropathy 12/05/2013   Irregular heart beat 12/05/2013   PVC (premature ventricular contraction) 12/05/2013   Sciatica 06/17/2013   Pain due to onychomycosis of toenail 05/21/2013   Mycotic toenails    Vagina bleeding 08/06/2012   Carotid stenosis 06/18/2012   Constipation 04/30/2012   Childhood asthma 12/18/2011   Glaucoma 12/18/2011   Hyperlipidemia 12/18/2011   Colon polyps 12/18/2011   Preventative health care 12/17/2011   Hypertension 12/17/2011   Hard of hearing    Dysplasia of cervix, low grade (CIN 1)    HPV (human papilloma virus) infection    Brain mass 09/18/2011    Orientation RESPIRATION BLADDER Height & Weight     Self  Normal Incontinent Weight: 186 lb 11.7 oz (84.7 kg) Height:  5' 4 (162.6 cm)  BEHAVIORAL SYMPTOMS/MOOD NEUROLOGICAL BOWEL NUTRITION STATUS      Continent Diet (Heart healthy diet)  AMBULATORY STATUS COMMUNICATION OF NEEDS Skin   Limited Assist Verbally Normal                       Personal Care Assistance Level of Assistance  Bathing, Feeding, Dressing Bathing Assistance: Limited assistance Feeding assistance: Limited assistance Dressing Assistance: Limited assistance     Functional Limitations Info  Sight, Hearing, Speech Sight Info: Adequate Hearing Info: Impaired Speech Info: Adequate    SPECIAL CARE FACTORS FREQUENCY  Contractures Contractures Info: Not present    Additional Factors Info  Code Status, Allergies, Psychotropic Code Status Info: Full Allergies Info: Aspirin, Penicillins Psychotropic Info: See discharge medication list         Current Medications (02/06/2024):  This is the current hospital active medication list Current Facility-Administered Medications  Medication Dose Route Frequency Provider Last Rate Last Admin    acetaminophen  (TYLENOL ) tablet 650 mg  650 mg Oral Q6H PRN Danice Dural, MD       Or   acetaminophen  (TYLENOL ) suppository 650 mg  650 mg Rectal Q6H PRN Danice Dural, MD       albuterol  (PROVENTIL ) (2.5 MG/3ML) 0.083% nebulizer solution 2.5 mg  2.5 mg Nebulization Q4H PRN Danice Dural, MD       artificial tears ophthalmic solution 1 drop  1 drop Both Eyes BID PRN Danice Dural, MD       clopidogrel  (PLAVIX ) tablet 75 mg  75 mg Oral Daily Danice Dural, MD   75 mg at 02/06/24 6213   cycloSPORINE  (RESTASIS ) 0.05 % ophthalmic emulsion 1 drop  1 drop Both Eyes BID Danice Dural, MD   1 drop at 02/06/24 0865   furosemide (LASIX) injection 20 mg  20 mg Intravenous Daily Dibia, Pauline E, MD   20 mg at 02/06/24 7846   guaiFENesin -dextromethorphan  (ROBITUSSIN DM) 100-10 MG/5ML syrup 10 mL  10 mL Oral Q6H PRN Danice Dural, MD       hydrALAZINE  (APRESOLINE ) tablet 25 mg  25 mg Oral TID Danice Dural, MD   25 mg at 02/06/24 9629   insulin  aspart (novoLOG ) injection 0-9 Units  0-9 Units Subcutaneous TID WC Danice Dural, MD   2 Units at 02/06/24 1215   insulin  glargine-yfgn (SEMGLEE ) injection 24 Units  24 Units Subcutaneous QHS Danice Dural, MD   24 Units at 02/05/24 2119   loperamide  (IMODIUM ) capsule 2 mg  2 mg Oral PRN Danice Dural, MD       ondansetron  (ZOFRAN ) tablet 4 mg  4 mg Oral Q6H PRN Danice Dural, MD       Or   ondansetron  (ZOFRAN ) injection 4 mg  4 mg Intravenous Q6H PRN Danice Dural, MD       potassium chloride SA (KLOR-CON M) CR tablet 40 mEq  40 mEq Oral Once Ortiz, David Manuel, MD       risperiDONE  (RISPERDAL ) tablet 1 mg  1 mg Oral BID Danice Dural, MD   1 mg at 02/06/24 5284     Discharge Medications: Please see discharge summary for a list of discharge medications. TAKE these medications     albuterol  108 (90 Base) MCG/ACT inhaler Commonly known as: VENTOLIN  HFA Inhale 2 puffs into the  lungs every 4 (four) hours as needed for wheezing or shortness of breath.    clopidogrel  75 MG tablet Commonly known as: PLAVIX  Take 1 tablet (75 mg total) by mouth daily.    Diabetic Tussin DM Max St 10-200 MG/5ML Liqd Generic drug: Dextromethorphan -guaiFENesin  Take 10 mLs by mouth every 6 (six) hours as needed (for coughing).    Nutritional Shake Liqd Take 120 mLs by mouth See admin instructions. MightyShake : Drink 120 ml's by mouth three times a day with meals    feeding supplement (GLUCERNA SHAKE) Liqd Take 237 mLs by mouth 3 (three) times daily between meals.    furosemide 20 MG tablet Commonly known as: LASIX Take 20 mg by mouth  daily.    hydrALAZINE  25 MG tablet Commonly known as: APRESOLINE  Take 25 mg by mouth 3 (three) times daily.    Lantus  SoloStar 100 UNIT/ML Solostar Pen Generic drug: insulin  glargine Inject 24 Units into the skin daily.    loperamide  2 MG capsule Commonly known as: IMODIUM  Take 2 mg by mouth as needed for diarrhea or loose stools (CANNOT EXCEED 16 MG/DAY).    ondansetron  4 MG tablet Commonly known as: ZOFRAN  Take 4 mg by mouth every 6 (six) hours as needed for nausea or vomiting.    OneTouch Delica Plus Lancet33G Misc Test bid    OneTouch Verio test strip Generic drug: glucose blood 1 each by Other route 2 (two) times daily. And lancets 2/day    Refresh Optive 1-0.9 % Gel Generic drug: Carboxymethylcellul-Glycerin Place 1 application  into both eyes 2 (two) times daily as needed (for dryness).    Restasis  0.05 % ophthalmic emulsion Generic drug: cycloSPORINE  Place 1 drop into both eyes 2 (two) times daily.    risperiDONE  1 MG/ML oral solution Commonly known as: RISPERDAL  Take 1 mL (1 mg total) by mouth 2 (two) times daily.    rosuvastatin  40 MG tablet Commonly known as: Crestor  Take 1 tablet (40 mg total) by mouth daily.   Relevant Imaging Results:  Relevant Lab Results:   Additional Information SSN:  [id number]  Zenon Hilda, LCSW

## 2024-02-06 NOTE — Evaluation (Signed)
 Physical Therapy Evaluation Patient Details Name: Gail Lynch MRN: [id number] DOB: [birth year] Today's Date: 02/06/2024  History of Present Illness  Patient is a 83 year old female whom transitioned from ALF to ED on 02/03/2024 due to generalized weakness, cough and congestion. Pt found to have acute on chronic dCHF and pleural effusion. Pt presented with weakness on R side on 4/21 and admitted with stroke like symptoms and AKI following recent fall. Pt PMH includes but is not limited to: dementia, CKD III, HTN, av block s/p pacemaker, aortic stenosis, and DM II.  Clinical Impression   Pt admitted with above diagnosis.  Pt currently with functional limitations due to the deficits listed below (see PT Problem List). Pt in bed when PT arrived. Pt initially agreeable to therapy intervention however as PT assisting pt toward EOB pt became resistive and exhibited pushing behaviors and indicated she was not going to do it that she needed help and that she was not up for it.  PT made several attempts to redirect, encourage and educate on rational for importance of mobility assessment and pt declined. Pt is a poor historian and unable to provide insight to PLOF or home living situation. Pt is resident at [facility] and per prior notes pt in memory care. Pt will require on going 24/7 supervision and assist in next venue. PT assisted pt to reposition in bed per pt preference and all needs in place.  Pt will benefit from acute skilled PT to increase their independence and safety with mobility to allow discharge.         If plan is discharge home, recommend the following: Two people to help with walking and/or transfers;Two people to help with bathing/dressing/bathroom;Assistance with cooking/housework;Direct supervision/assist for medications management;Direct supervision/assist for financial management;Assist for transportation;Help with stairs or ramp for entrance;Supervision due to cognitive  status   Can travel by private vehicle   No    Equipment Recommendations None recommended by PT  Recommendations for Other Services       Functional Status Assessment Patient has had a recent decline in their functional status and demonstrates the ability to make significant improvements in function in a reasonable and predictable amount of time.     Precautions / Restrictions Precautions Precautions: Fall Restrictions Weight Bearing Restrictions Per Provider Order: No      Mobility  Bed Mobility Overal bed mobility: Needs Assistance             General bed mobility comments: PT set up for transition to EOB, removed covers, provided cues, encouragement ed on rational and importance of mobility tasks and assisted pt's LEs to EOB and at that time pt became resistive and reported she was not going to do it pushing against therapist. PT discontinued efforts to assist pt OOB and helped to reposition pt in bed. pt denies pain, SOB or dizziness. pt stated she just was not up for it today.    Transfers                   General transfer comment: pt declined transfer tasks with PT today. Nurse made PT aware pt was able to perform SPT bed <> BSC with A x 1 from staff on 6/17    Ambulation/Gait               General Gait Details: NT  Stairs            Wheelchair Mobility     Tilt Bed  Modified Rankin (Stroke Patients Only)       Balance Overall balance assessment: History of Falls                                           Pertinent Vitals/Pain Pain Assessment Pain Assessment: No/denies pain Breathing: normal Negative Vocalization: none Facial Expression: smiling or inexpressive Body Language: relaxed Consolability: no need to console PAINAD Score: 0    Home Living Family/patient expects to be discharged to:: Assisted living                 Home Equipment: Rolling Walker (2 wheels);Cane - single  point Additional Comments: patient has resident at [facility] in memory care unit per daughter report.    Prior Function Prior Level of Function : Needs assist;Patient poor historian/Family not available             Mobility Comments: ambulatory with a Rw ADLs Comments: pt is a poor historian and no family present to provide insight, all information pertaining to PLOF and home living obtained from prior admission. daughter indicated patient was able to complete 90% of ADLs herself with some use of RW.     Extremity/Trunk Assessment        Lower Extremity Assessment Lower Extremity Assessment: Generalized weakness (B LE edema noted)       Communication   Communication Communication: Impaired Factors Affecting Communication: Hearing impaired    Cognition Arousal: Alert Behavior During Therapy: Agitated   PT - Cognitive impairments: No family/caregiver present to determine baseline, History of cognitive impairments                         Following commands: Impaired Following commands impaired: Follows one step commands with increased time (declined to actively participate with therapy difficult to determine)     Cueing       General Comments      Exercises     Assessment/Plan    PT Assessment Patient needs continued PT services  PT Problem List Decreased activity tolerance;Decreased balance;Decreased mobility;Decreased coordination;Decreased safety awareness       PT Treatment Interventions DME instruction;Gait training;Functional mobility training;Therapeutic activities;Therapeutic exercise;Neuromuscular re-education;Balance training;Patient/family education    PT Goals (Current goals can be found in the Care Plan section)  Acute Rehab PT Goals PT Goal Formulation: Patient unable to participate in goal setting    Frequency Min 2X/week     Co-evaluation               AM-PAC PT 6 Clicks Mobility  Outcome Measure Help needed  turning from your back to your side while in a flat bed without using bedrails?: Total Help needed moving from lying on your back to sitting on the side of a flat bed without using bedrails?: Total Help needed moving to and from a bed to a chair (including a wheelchair)?: Total Help needed standing up from a chair using your arms (e.g., wheelchair or bedside chair)?: Total Help needed to walk in hospital room?: Total Help needed climbing 3-5 steps with a railing? : Total 6 Click Score: 6    End of Session   Activity Tolerance: Other (comment) (pt declining to transition to EOB) Patient left: in bed;with call bell/phone within reach;with bed alarm set Nurse Communication: Mobility status PT Visit Diagnosis: Muscle weakness (generalized) (M62.81);Repeated falls (R29.6)  Time: 1610-9604 PT Time Calculation (min) (ACUTE ONLY): 12 min   Charges:       PT General Charges $$ ACUTE PT VISIT: 1 Visit         Cary Clarks, PT Acute Rehab   Annalee Kiang 02/06/2024, 2:51 PM

## 2024-02-06 NOTE — Plan of Care (Signed)

## 2024-02-06 NOTE — Progress Notes (Signed)
 Voicemail left for legal guardian about patient being discharged back to [facility].

## 2024-02-08 LAB — CULTURE, BLOOD (ROUTINE X 2)
Culture: NO GROWTH
Culture: NO GROWTH
Special Requests: ADEQUATE
Special Requests: ADEQUATE

## 2024-02-27 NOTE — Addendum Note (Signed)
 Addended by: VICCI SELLER A on: 02/27/2024 03:11 PM   Modules accepted: Orders

## 2024-02-27 NOTE — Progress Notes (Signed)
 Remote pacemaker transmission.

## 2024-04-09 ENCOUNTER — Ambulatory Visit (INDEPENDENT_AMBULATORY_CARE_PROVIDER_SITE_OTHER): Payer: Medicare Other

## 2024-04-09 DIAGNOSIS — I442 Atrioventricular block, complete: Secondary | ICD-10-CM

## 2024-04-10 ENCOUNTER — Ambulatory Visit: Payer: Self-pay | Admitting: Cardiology

## 2024-04-10 LAB — CUP PACEART REMOTE DEVICE CHECK
Battery Remaining Longevity: 78 mo
Battery Voltage: 2.98 V
Brady Statistic AP VP Percent: 7.98 %
Brady Statistic AP VS Percent: 0 %
Brady Statistic AS VP Percent: 91.92 %
Brady Statistic AS VS Percent: 0.1 %
Brady Statistic RA Percent Paced: 7.9 %
Brady Statistic RV Percent Paced: 99.9 %
Date Time Interrogation Session: 20250820055437
Implantable Lead Connection Status: 753985
Implantable Lead Connection Status: 753985
Implantable Lead Implant Date: 20210720
Implantable Lead Implant Date: 20210720
Implantable Lead Location: 753859
Implantable Lead Location: 753860
Implantable Lead Model: 5076
Implantable Lead Model: 5076
Implantable Pulse Generator Implant Date: 20210720
Lead Channel Impedance Value: 285 Ohm
Lead Channel Impedance Value: 304 Ohm
Lead Channel Impedance Value: 342 Ohm
Lead Channel Impedance Value: 361 Ohm
Lead Channel Pacing Threshold Amplitude: 0.625 V
Lead Channel Pacing Threshold Amplitude: 0.75 V
Lead Channel Pacing Threshold Pulse Width: 0.4 ms
Lead Channel Pacing Threshold Pulse Width: 0.4 ms
Lead Channel Sensing Intrinsic Amplitude: 1.125 mV
Lead Channel Sensing Intrinsic Amplitude: 1.125 mV
Lead Channel Sensing Intrinsic Amplitude: 5.625 mV
Lead Channel Sensing Intrinsic Amplitude: 5.75 mV
Lead Channel Setting Pacing Amplitude: 1.5 V
Lead Channel Setting Pacing Amplitude: 2.5 V
Lead Channel Setting Pacing Pulse Width: 0.4 ms
Lead Channel Setting Sensing Sensitivity: 1.2 mV
Zone Setting Status: 755011

## 2024-05-14 NOTE — Progress Notes (Signed)
 Remote PPM Transmission

## 2024-07-09 ENCOUNTER — Ambulatory Visit: Payer: Medicare Other

## 2024-07-09 DIAGNOSIS — I442 Atrioventricular block, complete: Secondary | ICD-10-CM | POA: Diagnosis not present

## 2024-07-10 ENCOUNTER — Ambulatory Visit: Payer: Self-pay | Admitting: Cardiology

## 2024-07-10 LAB — CUP PACEART REMOTE DEVICE CHECK
Battery Remaining Longevity: 72 mo
Battery Voltage: 2.97 V
Brady Statistic AP VP Percent: 7.93 %
Brady Statistic AP VS Percent: 0 %
Brady Statistic AS VP Percent: 92.04 %
Brady Statistic AS VS Percent: 0.03 %
Brady Statistic RA Percent Paced: 7.99 %
Brady Statistic RV Percent Paced: 99.97 %
Date Time Interrogation Session: 20251119000842
Implantable Lead Connection Status: 753985
Implantable Lead Connection Status: 753985
Implantable Lead Implant Date: 20210720
Implantable Lead Implant Date: 20210720
Implantable Lead Location: 753859
Implantable Lead Location: 753860
Implantable Lead Model: 5076
Implantable Lead Model: 5076
Implantable Pulse Generator Implant Date: 20210720
Lead Channel Impedance Value: 266 Ohm
Lead Channel Impedance Value: 285 Ohm
Lead Channel Impedance Value: 323 Ohm
Lead Channel Impedance Value: 342 Ohm
Lead Channel Pacing Threshold Amplitude: 0.625 V
Lead Channel Pacing Threshold Amplitude: 0.75 V
Lead Channel Pacing Threshold Pulse Width: 0.4 ms
Lead Channel Pacing Threshold Pulse Width: 0.4 ms
Lead Channel Sensing Intrinsic Amplitude: 0.875 mV
Lead Channel Sensing Intrinsic Amplitude: 0.875 mV
Lead Channel Sensing Intrinsic Amplitude: 5.875 mV
Lead Channel Sensing Intrinsic Amplitude: 5.875 mV
Lead Channel Setting Pacing Amplitude: 1.5 V
Lead Channel Setting Pacing Amplitude: 2.5 V
Lead Channel Setting Pacing Pulse Width: 0.4 ms
Lead Channel Setting Sensing Sensitivity: 1.2 mV
Zone Setting Status: 755011

## 2024-07-11 NOTE — Progress Notes (Signed)
 Remote PPM Transmission

## 2025-01-07 ENCOUNTER — Ambulatory Visit

## 2025-04-08 ENCOUNTER — Ambulatory Visit

## 2025-07-08 ENCOUNTER — Ambulatory Visit

## 2025-10-07 ENCOUNTER — Ambulatory Visit

## 2026-01-06 ENCOUNTER — Ambulatory Visit

## 2026-04-07 ENCOUNTER — Ambulatory Visit

## 2026-07-07 ENCOUNTER — Ambulatory Visit

## 2026-10-06 ENCOUNTER — Ambulatory Visit
